# Patient Record
Sex: Male | Born: 1950 | Race: White | Hispanic: No | Marital: Married | Smoking: Never smoker
Health system: Southern US, Community
[De-identification: ages and names within clinical notes are randomized; demographics above are authoritative.]

## PROBLEM LIST (undated history)

## (undated) DIAGNOSIS — I10 Essential (primary) hypertension: Secondary | ICD-10-CM

## (undated) DIAGNOSIS — R51 Headache: Secondary | ICD-10-CM

## (undated) DIAGNOSIS — Z87442 Personal history of urinary calculi: Secondary | ICD-10-CM

## (undated) DIAGNOSIS — N200 Calculus of kidney: Secondary | ICD-10-CM

## (undated) DIAGNOSIS — I639 Cerebral infarction, unspecified: Secondary | ICD-10-CM

## (undated) DIAGNOSIS — I209 Angina pectoris, unspecified: Secondary | ICD-10-CM

## (undated) DIAGNOSIS — R519 Headache, unspecified: Secondary | ICD-10-CM

## (undated) DIAGNOSIS — E785 Hyperlipidemia, unspecified: Secondary | ICD-10-CM

## (undated) DIAGNOSIS — I2510 Atherosclerotic heart disease of native coronary artery without angina pectoris: Secondary | ICD-10-CM

## (undated) DIAGNOSIS — M1990 Unspecified osteoarthritis, unspecified site: Secondary | ICD-10-CM

## (undated) HISTORY — PX: BACK SURGERY: SHX140

## (undated) HISTORY — PX: KIDNEY STONE SURGERY: SHX686

## (undated) HISTORY — PX: VASECTOMY: SHX75

## (undated) HISTORY — PX: LITHOTRIPSY: SUR834

---

## 1988-10-31 HISTORY — PX: RETINAL DETACHMENT SURGERY: SHX105

## 1988-10-31 HISTORY — PX: CATARACT EXTRACTION W/ INTRAOCULAR LENS IMPLANT: SHX1309

## 1988-10-31 HISTORY — PX: OTHER SURGICAL HISTORY: SHX169

## 1997-08-29 ENCOUNTER — Encounter: Admission: RE | Admit: 1997-08-29 | Discharge: 1997-11-27 | Payer: Self-pay | Admitting: Internal Medicine

## 1998-10-22 ENCOUNTER — Encounter: Payer: Self-pay | Admitting: Urology

## 1998-10-24 ENCOUNTER — Ambulatory Visit (HOSPITAL_COMMUNITY): Admission: RE | Admit: 1998-10-24 | Discharge: 1998-10-24 | Payer: Self-pay | Admitting: Urology

## 1998-10-24 ENCOUNTER — Encounter: Payer: Self-pay | Admitting: Urology

## 1998-11-21 ENCOUNTER — Ambulatory Visit (HOSPITAL_COMMUNITY): Admission: RE | Admit: 1998-11-21 | Discharge: 1998-11-21 | Payer: Self-pay | Admitting: Urology

## 1998-11-21 ENCOUNTER — Encounter: Payer: Self-pay | Admitting: Urology

## 1999-05-30 ENCOUNTER — Encounter: Admission: RE | Admit: 1999-05-30 | Discharge: 1999-05-30 | Payer: Self-pay | Admitting: Urology

## 1999-05-30 ENCOUNTER — Encounter: Payer: Self-pay | Admitting: Urology

## 1999-06-02 ENCOUNTER — Other Ambulatory Visit: Admission: RE | Admit: 1999-06-02 | Discharge: 1999-06-02 | Payer: Self-pay

## 1999-06-02 ENCOUNTER — Encounter (INDEPENDENT_AMBULATORY_CARE_PROVIDER_SITE_OTHER): Payer: Self-pay

## 1999-09-29 ENCOUNTER — Encounter: Admission: RE | Admit: 1999-09-29 | Discharge: 1999-09-29 | Payer: Self-pay | Admitting: Urology

## 1999-09-29 ENCOUNTER — Encounter: Payer: Self-pay | Admitting: Urology

## 1999-11-05 ENCOUNTER — Encounter: Admission: RE | Admit: 1999-11-05 | Discharge: 1999-11-05 | Payer: Self-pay | Admitting: Urology

## 1999-11-05 ENCOUNTER — Encounter: Payer: Self-pay | Admitting: Urology

## 2000-03-12 ENCOUNTER — Encounter: Admission: RE | Admit: 2000-03-12 | Discharge: 2000-03-12 | Payer: Self-pay | Admitting: Urology

## 2000-03-12 ENCOUNTER — Encounter: Payer: Self-pay | Admitting: Urology

## 2001-10-19 ENCOUNTER — Encounter: Payer: Self-pay | Admitting: Urology

## 2001-10-19 ENCOUNTER — Encounter: Admission: RE | Admit: 2001-10-19 | Discharge: 2001-10-19 | Payer: Self-pay | Admitting: Urology

## 2001-12-21 ENCOUNTER — Encounter: Admission: RE | Admit: 2001-12-21 | Discharge: 2001-12-21 | Payer: Self-pay | Admitting: Urology

## 2001-12-21 ENCOUNTER — Encounter: Payer: Self-pay | Admitting: Urology

## 2001-12-22 ENCOUNTER — Ambulatory Visit (HOSPITAL_BASED_OUTPATIENT_CLINIC_OR_DEPARTMENT_OTHER): Admission: RE | Admit: 2001-12-22 | Discharge: 2001-12-22 | Payer: Self-pay | Admitting: Urology

## 2001-12-22 ENCOUNTER — Encounter: Payer: Self-pay | Admitting: Urology

## 2002-01-09 ENCOUNTER — Ambulatory Visit (HOSPITAL_BASED_OUTPATIENT_CLINIC_OR_DEPARTMENT_OTHER): Admission: RE | Admit: 2002-01-09 | Discharge: 2002-01-09 | Payer: Self-pay | Admitting: Urology

## 2002-01-09 ENCOUNTER — Encounter: Payer: Self-pay | Admitting: Urology

## 2004-04-12 ENCOUNTER — Ambulatory Visit (HOSPITAL_COMMUNITY): Admission: RE | Admit: 2004-04-12 | Discharge: 2004-04-13 | Payer: Self-pay | Admitting: Neurosurgery

## 2005-10-05 ENCOUNTER — Ambulatory Visit (HOSPITAL_COMMUNITY): Admission: RE | Admit: 2005-10-05 | Discharge: 2005-10-05 | Payer: Self-pay | Admitting: Urology

## 2007-01-17 ENCOUNTER — Encounter: Admission: RE | Admit: 2007-01-17 | Discharge: 2007-01-17 | Payer: Self-pay | Admitting: Family Medicine

## 2010-07-18 NOTE — Op Note (Signed)
Mitchell Lambert, Mitchell Lambert NO.:  [id number]   MEDICAL RECORD NO.:  [id number]          PATIENT TYPE:  INP   LOCATION:                               FACILITY:  [facility]   PHYSICIAN:  Hewitt Shorts, M.D.DATE OF BIRTH:  12-03-1950   DATE OF PROCEDURE:  04/12/2004  DATE OF DISCHARGE:  04/13/2004                                 OPERATIVE REPORT   PREOPERATIVE DIAGNOSES:  1.  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  2.  Lumbar degenerative disk disease.  3.  Lumbar spondylosis.  4.  Lumbar radiculopathy.   POSTOPERATIVE DIAGNOSES:  1.  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  2.  Lumbar degenerative disk disease.  3.  Lumbar spondylosis.  4.  Lumbar radiculopathy.   PROCEDURE:  Left L4-5 lumbar laminotomy and microdiskectomy.   SURGEON:  Hewitt Shorts, M.D.   ANESTHESIA:  General endotracheal.   INDICATIONS:  The patient is a 60 year old man, who presented with an acute  left L5 lumbar radiculopathy with a left foot drop.  The decision was made  to proceed with lumbar laminotomy and microdiskectomy.   PROCEDURE:  The patient was brought to the operating room and placed under  general endotracheal anesthesia.  The patient was turned to a prone  position.  The lumbar region was prepped with Betadine soap and solution and  draped in a sterile fashion.  The midline was infiltrated with local  anesthetic with epinephrine.  An x-ray was taken and the L4-5 level  identified, then a midline incision was made over the L4-5 level and carried  down through the subcutaneous tissue.  Bipolar cautery and electrocautery  were used to maintain hemostasis.  The incision was carried down to the  lumbar fascia, which was incised on the left side of the midline in the  paraspinal muscles with dissection of the spinous processes and lamina in a  subperiosteal fashion.  The L4-5 intralaminar space was identified and an x-  ray was taken to confirm the localization.  Then, the microscope  was draped  and brought onto the field to provide additional magnification,  illumination, and visualization, and the remainder of the decompression was  performed using microdissection in a microsurgical technique.  A laminotomy  was performed using the X-Max drill and Kerrison punches. The ligamentum  flavum was carefully resected, and we identified the thecal sac and exiting  left L5 nerve root.  These structures were retracted medially, and we  identified the free fragment of disk herniation that had migrated caudally  behind the body of L5 and extending out into the left L5 nerve root foramen.  The remaining ligament fibers over the free fragment were opened and the  disk fragment extruded.  We removed two large fragments of disk.  No further  fragments were found in the epidural space or neuroforamen, and it was felt  that a good decompression of the thecal sac and exiting left L5 nerve root  was achieved.  We then examined the annulus at the L4-5 level, which was  degenerated and bulging, but it was not felt that any  sort of decompression  was necessary, and therefore, hemostasis was established with the use of  bipolar cautery as well as Gelfoam soaked in thrombin and the Gelfoam was  removed prior to closure.  Once hemostasis was established, we instilled 2  mL of fentanyl into the epidural space and proceeded with closure.  The deep  fascia was closed with interrupted undyed #1 Vicryl sutures, the  subcutaneous and subcuticular layers closed with interrupted undyed 2-0  Vicryl sutures, and the skin approximated with Dermabond.  The patient  tolerated the procedure well.  The estimated blood loss was 25 mL.  Sponge  and needle count were correct.  Following the surgery, the patient was  turned back to the supine position to be reversed from anesthetic,  extubated, and transferred to the recovery room for further care.      RWN/MEDQ  D:  04/12/2004  T:  04/13/2004  Job:   [id number]

## 2010-07-18 NOTE — Op Note (Signed)
NAME:  Mitchell Lambert, Mitchell Lambert                         ACCOUNT NO.:  [id number]   MEDICAL RECORD NO.:  [id number]                   PATIENT TYPE:  AMB   LOCATION:  [facility]                                 FACILITY:  [facility]   PHYSICIAN:  Bertram Millard. Dahlstedt, M.D.          DATE OF BIRTH:  [birth year]   DATE OF PROCEDURE:  01/09/2002  DATE OF DISCHARGE:                                 OPERATIVE REPORT   PREOPERATIVE DIAGNOSIS:  Mid left ureteral calculus.   POSTOPERATIVE DIAGNOSIS:  Mid left ureteral calculus.   PRINCIPAL PROCEDURE:  1. Cystoscopy.  2. Left retrograde ureteropyelogram.  3. Holmium laser lithotripsy of mid ureteral stone.  4. Double-J stent placement.  5. Interpretation of fluoroscopy.   SURGEON:  Bertram Millard. Dahlstedt, M.D.   ANESTHESIA:  General with LMA.   COMPLICATIONS:  None.   BRIEF HISTORY:  This 60 year old man has had persistent left ureteral stone  for many weeks.  He has been relatively asymptomatic, but the stone has not  been adequately treated with extracorporeal shockwave lithotripsy.  The  patient's body habitus precludes another try at that.  Due to nonpassage of  the stone and intermittent pain, it was recommended that the patient undergo  direct ureteroscopy with laser lithotripsy of the stone.  He is aware of  risks and complications as well as alternatives.  He desires to proceed.   DESCRIPTION OF PROCEDURE:  The patient was administered a general anesthetic  after IV antibiotics were given preoperatively.  He was taken to the  operating room where general anesthetic was introduced with the patient  being placed in the dorsal lithotomy position.  Genitalia and perineum were  prepped and draped.  A cystoscope was placed in to the bladder, and a left  retrograde was performed showing a mid ureteral stone without obstruction at  the present time.  A guidewire was advanced by the stone, and the scope was  removed.  A ureteroscope was advanced directly to  the stone.  The stone was  quite impacted behind a mild stricture.  Once the stone was encountered, the  large laser fiber was placed through, and the stone was fragmented into  multiple small pieces.  It was quite evident that the stone was impacted on  the medial surface of the ureter, and I felt that there may be a small  perforation of this area.  Fragmentation was completed, and the small  fragments were removed with a nitinol basket.  I did not see any other  fragments.  Following extraction of the fragments, the ureteroscope was  removed, and a 26 cm x 6 French double-J stent with a string on was passed  up into the left ureter and to the renal pelvis using fluoroscopic and  cystoscopic guidance.  Once the guidewire was removed, good proximal and  distal curls were seen on the stent.  The bladder was drained and the scope  removed.  At this point, the patient was awakened and taken to the PACU in stable  condition.  He was given 30 mg of Toradol IV prior to waking up and also a  B&O suppository.   He will follow up in my office in approximately 1-2 weeks with a KUB.  He  was told to remove the stent by pulling the string in three days.  Discharge  medications include Keflex 500 mg p.o. q.12h. x 5 days, Oxybutynin 5 mg p.o.  q.8h. p.r.n. urinary frequency, and Percocet 10/325, 1 p.o. q.4h. p.r.n.  pain.                                               Bertram Millard. Dahlstedt, M.D.    SMD/MEDQ  D:  01/09/2002  T:  01/09/2002  Job:  [id number]

## 2011-04-27 ENCOUNTER — Emergency Department (HOSPITAL_COMMUNITY): Payer: BC Managed Care – PPO

## 2011-04-27 ENCOUNTER — Inpatient Hospital Stay (HOSPITAL_COMMUNITY)
Admission: EM | Admit: 2011-04-27 | Discharge: 2011-05-05 | DRG: 107 | Disposition: A | Discharge status: Home / Self Care | Payer: BC Managed Care – PPO | Attending: Thoracic Surgery (Cardiothoracic Vascular Surgery) | Admitting: Thoracic Surgery (Cardiothoracic Vascular Surgery)

## 2011-04-27 ENCOUNTER — Other Ambulatory Visit: Payer: Self-pay

## 2011-04-27 ENCOUNTER — Encounter (HOSPITAL_COMMUNITY): Payer: Self-pay | Admitting: *Deleted

## 2011-04-27 DIAGNOSIS — M109 Gout, unspecified: Secondary | ICD-10-CM | POA: Diagnosis present

## 2011-04-27 DIAGNOSIS — I2510 Atherosclerotic heart disease of native coronary artery without angina pectoris: Principal | ICD-10-CM | POA: Diagnosis present

## 2011-04-27 DIAGNOSIS — Z882 Allergy status to sulfonamides status: Secondary | ICD-10-CM

## 2011-04-27 DIAGNOSIS — E669 Obesity, unspecified: Secondary | ICD-10-CM

## 2011-04-27 DIAGNOSIS — Z79899 Other long term (current) drug therapy: Secondary | ICD-10-CM

## 2011-04-27 DIAGNOSIS — E8779 Other fluid overload: Secondary | ICD-10-CM | POA: Diagnosis not present

## 2011-04-27 DIAGNOSIS — Z7982 Long term (current) use of aspirin: Secondary | ICD-10-CM

## 2011-04-27 DIAGNOSIS — I10 Essential (primary) hypertension: Secondary | ICD-10-CM

## 2011-04-27 DIAGNOSIS — Z794 Long term (current) use of insulin: Secondary | ICD-10-CM

## 2011-04-27 DIAGNOSIS — Z6830 Body mass index (BMI) 30.0-30.9, adult: Secondary | ICD-10-CM

## 2011-04-27 DIAGNOSIS — E785 Hyperlipidemia, unspecified: Secondary | ICD-10-CM

## 2011-04-27 DIAGNOSIS — Z888 Allergy status to other drugs, medicaments and biological substances status: Secondary | ICD-10-CM

## 2011-04-27 DIAGNOSIS — E875 Hyperkalemia: Secondary | ICD-10-CM | POA: Diagnosis not present

## 2011-04-27 DIAGNOSIS — N200 Calculus of kidney: Secondary | ICD-10-CM | POA: Insufficient documentation

## 2011-04-27 DIAGNOSIS — E119 Type 2 diabetes mellitus without complications: Secondary | ICD-10-CM

## 2011-04-27 DIAGNOSIS — Z881 Allergy status to other antibiotic agents status: Secondary | ICD-10-CM

## 2011-04-27 DIAGNOSIS — I200 Unstable angina: Secondary | ICD-10-CM | POA: Diagnosis present

## 2011-04-27 HISTORY — PX: CARDIAC CATHETERIZATION: SHX172

## 2011-04-27 HISTORY — DX: Headache, unspecified: R51.9

## 2011-04-27 HISTORY — DX: Hyperlipidemia, unspecified: E78.5

## 2011-04-27 HISTORY — DX: Headache: R51

## 2011-04-27 HISTORY — DX: Atherosclerotic heart disease of native coronary artery without angina pectoris: I25.10

## 2011-04-27 HISTORY — DX: Angina pectoris, unspecified: I20.9

## 2011-04-27 HISTORY — DX: Unspecified osteoarthritis, unspecified site: M19.90

## 2011-04-27 HISTORY — DX: Essential (primary) hypertension: I10

## 2011-04-27 LAB — COMPREHENSIVE METABOLIC PANEL
ALT: 125 U/L — ABNORMAL HIGH (ref 0–53)
AST: 86 U/L — ABNORMAL HIGH (ref 0–37)
Albumin: 3.6 g/dL (ref 3.5–5.2)
Alkaline Phosphatase: 129 U/L — ABNORMAL HIGH (ref 39–117)
BUN: 25 mg/dL — ABNORMAL HIGH (ref 6–23)
CO2: 24 mEq/L (ref 19–32)
Calcium: 10.1 mg/dL (ref 8.4–10.5)
Chloride: 105 mEq/L (ref 96–112)
Creatinine, Ser: 1.22 mg/dL (ref 0.50–1.35)
GFR calc Af Amer: 72 mL/min — ABNORMAL LOW (ref >90)
GFR calc non Af Amer: 62 mL/min — ABNORMAL LOW (ref >90)
Glucose, Bld: 101 mg/dL — ABNORMAL HIGH (ref 70–99)
Potassium: 3.9 mEq/L (ref 3.5–5.1)
Sodium: 140 mEq/L (ref 135–145)
Total Bilirubin: 0.5 mg/dL (ref 0.3–1.2)
Total Protein: 7.3 g/dL (ref 6.0–8.3)

## 2011-04-27 LAB — DIFFERENTIAL
Basophils Absolute: 0 10*3/uL (ref 0.0–0.1)
Basophils Relative: 1 % (ref 0–1)
Eosinophils Absolute: 0.1 10*3/uL (ref 0.0–0.7)
Eosinophils Relative: 2 % (ref 0–5)
Lymphocytes Relative: 39 % (ref 12–46)
Lymphs Abs: 2.4 10*3/uL (ref 0.7–4.0)
Monocytes Absolute: 0.6 10*3/uL (ref 0.1–1.0)
Monocytes Relative: 10 % (ref 3–12)
Neutro Abs: 3 10*3/uL (ref 1.7–7.7)
Neutrophils Relative %: 49 % (ref 43–77)

## 2011-04-27 LAB — BASIC METABOLIC PANEL
BUN: 28 mg/dL — ABNORMAL HIGH (ref 6–23)
CO2: 24 mEq/L (ref 19–32)
Calcium: 10 mg/dL (ref 8.4–10.5)
Chloride: 106 mEq/L (ref 96–112)
Creatinine, Ser: 1.22 mg/dL (ref 0.50–1.35)
GFR calc Af Amer: 72 mL/min — ABNORMAL LOW (ref >90)
GFR calc non Af Amer: 62 mL/min — ABNORMAL LOW (ref >90)
Glucose, Bld: 274 mg/dL — ABNORMAL HIGH (ref 70–99)
Potassium: 4.4 mEq/L (ref 3.5–5.1)
Sodium: 138 mEq/L (ref 135–145)

## 2011-04-27 LAB — CBC
HCT: 41.8 % (ref 39.0–52.0)
HCT: 43.5 % (ref 39.0–52.0)
Hemoglobin: 15 g/dL (ref 13.0–17.0)
Hemoglobin: 15.8 g/dL (ref 13.0–17.0)
MCH: 31.8 pg (ref 26.0–34.0)
MCH: 32.4 pg (ref 26.0–34.0)
MCHC: 35.9 g/dL (ref 30.0–36.0)
MCHC: 36.3 g/dL — ABNORMAL HIGH (ref 30.0–36.0)
MCV: 88.7 fL (ref 78.0–100.0)
MCV: 89.3 fL (ref 78.0–100.0)
Platelets: 180 10*3/uL (ref 150–400)
Platelets: 183 10*3/uL (ref 150–400)
RBC: 4.71 MIL/uL (ref 4.22–5.81)
RBC: 4.87 MIL/uL (ref 4.22–5.81)
RDW: 13 % (ref 11.5–15.5)
RDW: 13.2 % (ref 11.5–15.5)
WBC: 5.3 10*3/uL (ref 4.0–10.5)
WBC: 6.1 10*3/uL (ref 4.0–10.5)

## 2011-04-27 LAB — CARDIAC PANEL(CRET KIN+CKTOT+MB+TROPI)
CK, MB: 2.4 ng/mL (ref 0.3–4.0)
CK, MB: 2.2 ng/mL (ref 0.3–4.0)
Relative Index: INVALID (ref 0.0–2.5)
Relative Index: INVALID (ref 0.0–2.5)
Total CK: 46 U/L (ref 7–232)
Total CK: 47 U/L (ref 7–232)
Troponin I: <0.3 ng/mL (ref <0.30)
Troponin I: <0.3 ng/mL (ref <0.30)

## 2011-04-27 LAB — TROPONIN I: Troponin I: <0.3 ng/mL (ref <0.30)

## 2011-04-27 LAB — PROTIME-INR
INR: 1 (ref 0.00–1.49)
Prothrombin Time: 13.4 seconds (ref 11.6–15.2)

## 2011-04-27 LAB — GLUCOSE, CAPILLARY: Glucose-Capillary: 107 mg/dL — ABNORMAL HIGH (ref 70–99)

## 2011-04-27 LAB — APTT: aPTT: 27 seconds (ref 24–37)

## 2011-04-27 MED ORDER — OXYCODONE-ACETAMINOPHEN 5-325 MG PO TABS
1.0000 | ORAL_TABLET | Freq: Every day | ORAL | Status: DC | PRN
  Administered 2011-04-28 (×2): 1 via ORAL
  Filled 2011-04-27 (×4): qty 1

## 2011-04-27 MED ORDER — ACETAMINOPHEN 325 MG PO TABS: 650.0000 mg | ORAL_TABLET | ORAL | Status: DC | PRN

## 2011-04-27 MED ORDER — HEPARIN BOLUS VIA INFUSION: 4000.0000 [IU] | Freq: Once | INTRAVENOUS | Status: DC

## 2011-04-27 MED ORDER — BIOTIN 5000 MCG PO TABS: 1.0000 | ORAL_TABLET | Freq: Every day | ORAL | Status: DC

## 2011-04-27 MED ORDER — LOSARTAN POTASSIUM 50 MG PO TABS: 100.0000 mg | ORAL_TABLET | Freq: Every day | ORAL | Status: DC

## 2011-04-27 MED ORDER — METOPROLOL TARTRATE 25 MG PO TABS
25.0000 mg | ORAL_TABLET | Freq: Two times a day (BID) | ORAL | Status: DC
  Administered 2011-04-28 – 2011-04-29 (×4): 25 mg via ORAL
  Filled 2011-04-27 (×7): qty 1

## 2011-04-27 MED ORDER — ONDANSETRON HCL 4 MG/2ML IJ SOLN: 4.0000 mg | Freq: Four times a day (QID) | INTRAMUSCULAR | Status: DC | PRN

## 2011-04-27 MED ORDER — SODIUM CHLORIDE 0.9 % IV SOLN: INTRAVENOUS | Status: DC

## 2011-04-27 MED ORDER — ADULT MULTIVITAMIN W/MINERALS CH: 1.0000 | ORAL_TABLET | Freq: Every day | ORAL | Status: DC

## 2011-04-27 MED ORDER — ALLOPURINOL 300 MG PO TABS
300.0000 mg | ORAL_TABLET | Freq: Every day | ORAL | Status: DC
  Administered 2011-04-28 – 2011-04-29 (×2): 300 mg via ORAL
  Filled 2011-04-27 (×3): qty 1

## 2011-04-27 MED ORDER — OMEGA-3 FATTY ACIDS 1000 MG PO CAPS: 1.0000 g | ORAL_CAPSULE | Freq: Two times a day (BID) | ORAL | Status: DC

## 2011-04-27 MED ORDER — VITAMIN C 500 MG PO TABS
1000.0000 mg | ORAL_TABLET | Freq: Two times a day (BID) | ORAL | Status: DC
  Administered 2011-04-28 – 2011-04-29 (×4): 1000 mg via ORAL
  Filled 2011-04-27 (×6): qty 2

## 2011-04-27 MED ORDER — ASPIRIN 325 MG PO TABS: 325.0000 mg | ORAL_TABLET | ORAL | Status: DC

## 2011-04-27 MED ORDER — SIMVASTATIN 40 MG PO TABS
40.0000 mg | ORAL_TABLET | Freq: Every day | ORAL | Status: DC
  Administered 2011-04-28 – 2011-04-29 (×2): 40 mg via ORAL
  Filled 2011-04-27 (×3): qty 1

## 2011-04-27 MED ORDER — DIAZEPAM 5 MG PO TABS
10.0000 mg | ORAL_TABLET | ORAL | Status: AC
  Administered 2011-04-28: 10 mg via ORAL
  Filled 2011-04-27 (×2): qty 1

## 2011-04-27 MED ORDER — OXYCODONE-ACETAMINOPHEN 10-325 MG PO TABS: 0.5000 | ORAL_TABLET | Freq: Every day | ORAL | Status: DC | PRN

## 2011-04-27 MED ORDER — VITAMIN D3 125 MCG (5000 UT) PO TABS: 1.0000 | ORAL_TABLET | Freq: Every day | ORAL | Status: DC

## 2011-04-27 MED ORDER — INSULIN ASPART 100 UNIT/ML ~~LOC~~ SOLN
0.0000 [IU] | Freq: Three times a day (TID) | SUBCUTANEOUS | Status: DC
  Filled 2011-04-27: qty 3

## 2011-04-27 MED ORDER — INSULIN GLARGINE 100 UNIT/ML ~~LOC~~ SOLN
140.0000 [IU] | Freq: Every morning | SUBCUTANEOUS | Status: DC
  Administered 2011-04-28 – 2011-04-29 (×2): 140 [IU] via SUBCUTANEOUS
  Filled 2011-04-27 (×2): qty 3

## 2011-04-27 MED ORDER — ASPIRIN EC 81 MG PO TBEC
81.0000 mg | DELAYED_RELEASE_TABLET | Freq: Every day | ORAL | Status: DC
  Administered 2011-04-28 – 2011-04-29 (×2): 81 mg via ORAL
  Filled 2011-04-27 (×3): qty 1

## 2011-04-27 MED ORDER — HEPARIN SOD (PORCINE) IN D5W 100 UNIT/ML IV SOLN
1400.0000 [IU]/h | INTRAVENOUS | Status: DC
  Filled 2011-04-27 (×5): qty 250

## 2011-04-27 MED ORDER — INSULIN ASPART 100 UNIT/ML ~~LOC~~ SOLN
20.0000 [IU] | Freq: Three times a day (TID) | SUBCUTANEOUS | Status: DC
  Administered 2011-04-28: 20 [IU] via SUBCUTANEOUS
  Filled 2011-04-27: qty 3

## 2011-04-27 MED ORDER — NITROGLYCERIN 0.4 MG SL SUBL
0.4000 mg | SUBLINGUAL_TABLET | SUBLINGUAL | Status: DC | PRN
  Administered 2011-04-27: 0.4 mg via SUBLINGUAL
  Filled 2011-04-27 (×2): qty 25

## 2011-04-27 NOTE — ED Notes (Signed)
Glucose 107

## 2011-04-27 NOTE — ED Notes (Signed)
Called pharmacy re: Heparin & med request, per pharmacy there is a system problem, pharmacy to send Heparin ASAP

## 2011-04-27 NOTE — ED Notes (Signed)
2013-01 rEADY

## 2011-04-27 NOTE — ED Notes (Signed)
Rn on 2000 unable to accept pt due to CP 2/10.  Dr. Donnie Aho will be paged

## 2011-04-27 NOTE — ED Notes (Signed)
RN on 2000 will call me back

## 2011-04-27 NOTE — ED Notes (Signed)
Spoke with cardiology regarding pt situation and he informed me to change to stepdown.  Flow manager notified of need for change in bed status

## 2011-04-27 NOTE — ED Notes (Signed)
Attempt to call report to 2000 again

## 2011-04-27 NOTE — ED Notes (Signed)
Attempt to call report to 2000 

## 2011-04-27 NOTE — ED Notes (Signed)
Paged Dr. Tresa Endo to [phone]

## 2011-04-27 NOTE — ED Notes (Signed)
Attempted to phone report to floor. receiving RN unable to take report at this time. Left number for nurse to call.

## 2011-04-27 NOTE — Progress Notes (Signed)
ANTICOAGULATION CONSULT NOTE - Initial Consult  Pharmacy Consult for Heparin Indication: chest pain/ACS  Allergies  Allergen Reactions  . Doxycycline   . Gabapentin   . Metformin And Related   . Sulfa Antibiotics     Patient Measurements: Height: 6' (182.9 cm) Weight: 255 lb (115.667 kg) IBW/kg (Calculated) : 77.6  Heparin Dosing Weight: 101  Vital Signs: Temp: 98.6 F (37 C) (02/25 2144) Temp src: Oral (02/25 1507) BP: 149/69 mmHg (02/25 2144) Pulse Rate: 69  (02/25 2144)  Labs:  Basename 04/27/11 2006 04/27/11 1533  HGB -- 15.0  HCT -- 41.8  PLT -- 180  APTT -- --  LABPROT -- --  INR -- --  HEPARINUNFRC -- --  CREATININE -- 1.22  CKTOTAL 46 --  CKMB 2.4 --  TROPONINI <0.30 <0.30   Estimated Creatinine Clearance: 83.5 ml/min (by C-G formula based on Cr of 1.22).  Medical History: Past Medical History  Diagnosis Date  . Diabetes mellitus   . Hypertension   . Hyperlipemia   . Kidney stone   . Arthritis     Medications:   (Not in a hospital admission)  Assessment: 61 yo M admitted with CP.  Pharmacy consulted to start heparin.  Patient denies recent surgery or bleeding disorder.  Noted wart on palm of right hand that bleeds intermittently, not bleeding now.  Goal of Therapy:  Heparin level 0.3-0.7 units/ml   Plan:  1. Heparin 4000 units IV x 1 then 1400 units/hr 2. Heparin level 6h after ggt starts 3. Daily heparin level and CBC  Lovenia Kim Pharm.D., BCPS Clinical Pharmacist 04/27/2011 10:19 PM Pager: [phone] Phone: [phone]    Rutledge Selsor Merrily Brittle 04/27/2011,10:07 PM

## 2011-04-27 NOTE — H&P (Addendum)
Admit date: 04/27/2011 Name:  Mitchell Lambert Medical record number: [id number] DOB/Age:  62-26-1952  61 y.o.  Referring Physician:   Dr. Gwyneth Sprout  Primary Physician: Dr. 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 He has had significant arthritis for which she takes ibuprofen. He noted about a month of intermittent substernal chest discomfort when he would walk up hills it would resolve. On Friday evening he had the onset of tightness in his anterior chest that radiated across both shoulders and somewhat into his left arm that occurred at rest. The symptoms persisted somewhat on Saturday and were not necessarily related to activity. He did not have significant symptoms on Sunday. Today after going to work had the onset of midsternal tightness in particular with radiation down the left arm that persisted. He called his family physician who advised him to come to the emergency room this afternoon and he was seen in the emergency room her initial troponin was negative and EKG was fairly unremarkable. Because of the suggestive history he is admitted to the hospital for treatment of unstable angina. He is having some vague symptoms at the present time.   Past Medical History  Diagnosis Date  . Diabetes mellitus   . Hypertension   . Hyperlipemia   . Kidney stone   . Arthritis       Past Surgical History  Procedure Date  . Lithotripsy   . Kidney stone surgery   . Surgery on toe   .  Allergies: is allergic to doxycycline; gabapentin; metformin and related; and sulfa antibiotics.    Medications:  Prior to Admission medications   Medication Sig Start Date End Date Taking? Authorizing Provider  allopurinol (ZYLOPRIM) 300 MG tablet Take 300 mg by mouth daily.   Yes Historical Provider, MD  Biotin 5000 MCG TABS Take  1 tablet by mouth daily.   Yes Historical Provider, MD  Cholecalciferol (VITAMIN D3) 5000 UNITS TABS Take 1 tablet by mouth daily.   Yes Historical Provider, MD  fish oil-omega-3 fatty acids 1000 MG capsule Take 1 g by mouth 2 (two) times daily.   Yes Historical Provider, MD  ibuprofen (ADVIL,MOTRIN) 200 MG tablet Take 400 mg by mouth every 6 (six) hours as needed. For pain   Yes Historical Provider, MD  insulin aspart (NOVOLOG) 100 UNIT/ML injection Inject 20-30 Units into the skin 3 (three) times daily before meals. Pt is on a sliding scale   Yes Historical Provider, MD  insulin glargine (LANTUS) 100 UNIT/ML injection Inject 140 Units into the skin every morning.   Yes Historical Provider, MD  losartan (COZAAR) 100 MG tablet Take 100 mg by mouth daily.   Yes Historical Provider, MD  Multiple Vitamin (MULITIVITAMIN WITH MINERALS) TABS Take 1 tablet by mouth daily.   Yes Historical Provider, MD  oxyCODONE-acetaminophen (PERCOCET) 10-325 MG per tablet Take 0.5 tablets by mouth daily as needed. For pain   Yes Historical Provider, MD  simvastatin (ZOCOR) 40 MG tablet Take 40 mg by mouth every morning.   Yes Historical Provider, MD  vitamin C (ASCORBIC ACID) 500 MG tablet Take 1,000 mg by mouth 2 (two) times daily.   Yes Historical Provider, MD  . Family History:    Mother died of stroke and had a history of cardiovascular disease. His father is living and in his 3s and has a history of  a stroke and also myocardial infarction. He has 2 brothers and one sister who are alive and well. A grandfather had diabetes.  Social History:   reports that he has never smoked. He has never used smokeless tobacco. He reports that he drinks alcohol. He reports that he does not use illicit drugs.   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     Review of Systems:  General ROS: Obese for many years, has not been able to get much exercise. Energy level  has been good, no fever. Ophthalmic ROS: Has a previous retinal detachment but no diabetic retinopathy, has had a previous lens implant ENT ROS: negative Hematological and Lymphatic ROS: negative for - bleeding problems, bruising or weight loss Respiratory ROS: no cough, shortness of breath, or wheezing Cardiovascular ROS: See history of present illness Gastrointestinal ROS: no abdominal pain, change in bowel habits, or black or bloody stools Genito-Urinary ROS: Mild nocturia, has a history of uric acid kidney stones and multiple lithotripsies. Passes stones fairly often. Mild erectile dysfunction. Musculoskeletal ROS: Significant arthritis involving the right foot. He has seen a pain specialist for significant pain and arthritis in the past. Neurological ROS: no TIA or stroke symptoms, he does have a history of some neuropathic symptoms involving his lower legs. Other than as noted above, the remainder of the review of systems is normal  Physical Exam: BP 167/94  Pulse 77  Temp(Src) 98.9 F (37.2 C) (Oral)  Resp 18  Ht 6' (1.829 m)  Wt 115.667 kg (255 lb)  BMI 34.58 kg/m2  SpO2 96% General appearance: alert, appears stated age, no distress and moderately obese Head: Normocephalic, without obvious abnormality, atraumatic Eyes: Pupils equal bilaterally, EOMI, funduscopic not done Ears: Normal external canals Throat: lips, mucosa, and tongue normal; teeth and gums normal Neck: no adenopathy, no carotid bruit, no JVD, supple, symmetrical, trachea midline and thyroid not enlarged, symmetric, no tenderness/mass/nodules Lungs: clear to auscultation bilaterally Chest wall: no tenderness Heart: regular rate and rhythm, S1, S2 normal, no murmur, click, rub or gallop Abdomen: soft, non-tender; bowel sounds normal; no masses,  no organomegaly Rectal: deferred Extremities: extremities normal, atraumatic, no cyanosis or edema Pulses: 2+ and symmetric Skin: Skin color, texture, turgor normal. No  rashes or lesions Neurologic: Alert and oriented X 3, normal strength and tone. Normal symmetric reflexes. Normal coordination and gait  Labs: Results for orders placed during the hospital encounter of 04/27/11 (from the past 24 hour(s))  CBC     Status: Normal   Collection Time   04/27/11  3:33 PM      Component Value Range   WBC 5.3  4.0 - 10.5 (K/uL)   RBC 4.71  4.22 - 5.81 (MIL/uL)   Hemoglobin 15.0  13.0 - 17.0 (g/dL)   HCT 16.1  09.6 - 04.5 (%)   MCV 88.7  78.0 - 100.0 (fL)   MCH 31.8  26.0 - 34.0 (pg)   MCHC 35.9  30.0 - 36.0 (g/dL)   RDW 40.9  81.1 - 91.4 (%)   Platelets 180  150 - 400 (K/uL)  BASIC METABOLIC PANEL     Status: Abnormal   Collection Time   04/27/11  3:33 PM      Component Value Range   Sodium 138  135 - 145 (mEq/L)   Potassium 4.4  3.5 - 5.1 (mEq/L)   Chloride 106  96 - 112 (mEq/L)   CO2 24  19 - 32 (mEq/L)   Glucose, Bld 274 (*) 70 - 99 (mg/dL)   BUN 28 (*) 6 - 23 (mg/dL)   Creatinine, Ser 1.61  0.50 - 1.35 (mg/dL)   Calcium 09.6  8.4 - 10.5 (mg/dL)   GFR calc non Af Amer 62 (*) >90 (mL/min)   GFR calc Af Amer 72 (*) >90 (mL/min)  TROPONIN I     Status: Normal   Collection Time   04/27/11  3:33 PM      Component Value Range   Troponin I <0.30  <0.30 (ng/mL)  CARDIAC PANEL(CRET KIN+CKTOT+MB+TROPI)     Status: Normal   Collection Time   04/27/11  8:06 PM      Component Value Range   Total CK 46  7 - 232 (U/L)   CK, MB 2.4  0.3 - 4.0 (ng/mL)   Troponin I <0.30  <0.30 (ng/mL)   Relative Index RELATIVE INDEX IS INVALID  0.0 - 2.5    EKG: Q-wave and T-wave inversion in lead 3, otherwise unremarkable  Radiology: Normal heart size, no active cardiopulmonary disease   IMPRESSIONS: 1. Chest pain consistent with unstable angina pectoris 2. Insulin-dependent diabetes mellitus 3. Hypertension 4. Hyperlipidemia under treatment 5. History of uric acid kidney stones 6. Obesity  PLAN: Telemetry, serial cardiac enzymes, intravenous heparin, beta  blocker, will need cardiac catheterization because of suggestive history multiple risk factors since he is at high risk of ischemia.Cardiac catheterization was discussed with the patient fully including risks of myocardial infarction, death, stroke, bleeding, arrhythmia, dye allergy, renal insufficiency or bleeding.  The patient understands and is willing to proceed. Possibility of intervention also discussed with patient including percutaneous intervention or possibility of bypass surgery.   Signed: Darden Palmer. MD [facility] 04/27/2011, 9:13 PM     Patient seen and examined.  No interval change in history and exam since last night..  Stable for procedure.  Darden Palmer. MD [facility]  04/28/2011   7:30 am

## 2011-04-27 NOTE — ED Notes (Signed)
Patient reports he had pain in his chest when he went to bed on Friday night.  He also reports aching into both arms. Patient states he took aspirin.  Patient states on Saturday the sx persisted.  On Sunday he rested.  Patient states today he had return of sx while at work,  He called his md and was advised to come to Ed

## 2011-04-27 NOTE — ED Provider Notes (Addendum)
History     CSN: [id number]  Arrival date & time 04/27/11  1456   First MD Initiated Contact with Patient 04/27/11 1857      Chief Complaint  Patient presents with  . Chest Pain    (Consider location/radiation/quality/duration/timing/severity/associated sxs/prior treatment) Patient is a 61 y.o. male presenting with chest pain. The history is provided by the patient.  Chest Pain The chest pain began 2 days ago. Duration of episode(s) is 15 minutes. Chest pain occurs intermittently. The chest pain is improving. The pain is associated with stress and exertion (Seems to be worse with exertion and stress). At its most intense, the pain is at 6/10. The pain is currently at 1/10. The severity of the pain is mild. The quality of the pain is described as heavy and pressure-like. The pain radiates to the left arm and right arm. Exacerbated by: Worst episode occurred with rest after lying down. Pertinent negatives for primary symptoms include no fever, no syncope, no shortness of breath, no cough, no wheezing, no palpitations, no abdominal pain, no nausea, no vomiting and no dizziness.  Pertinent negatives for associated symptoms include no diaphoresis and no lower extremity edema. He tried aspirin for the symptoms. Risk factors include male gender.  His past medical history is significant for diabetes and hypertension.  Procedure history is negative for cardiac catheterization and stress echo.     Past Medical History  Diagnosis Date  . Diabetes mellitus   . Hypertension   . Hyperlipemia   . Kidney stone   . Arthritis     Past Surgical History  Procedure Date  . Lithotripsy   . Kidney stone surgery     No family history on file.  History  Substance Use Topics  . Smoking status: Never Smoker   . Smokeless tobacco: Not on file  . Alcohol Use: Yes      Review of Systems  Constitutional: Negative for fever and diaphoresis.  Respiratory: Negative for cough, shortness of breath  and wheezing.   Cardiovascular: Positive for chest pain. Negative for palpitations and syncope.  Gastrointestinal: Negative for nausea, vomiting and abdominal pain.  Neurological: Negative for dizziness.  All other systems reviewed and are negative.    Allergies  Doxycycline; Gabapentin; Metformin and related; and Sulfa antibiotics  Home Medications   Current Outpatient Rx  Name Route Sig Dispense Refill  . ALLOPURINOL 300 MG PO TABS Oral Take 300 mg by mouth daily.    Marland Kitchen BIOTIN 5000 MCG PO TABS Oral Take 1 tablet by mouth daily.    Marland Kitchen VITAMIN D3 5000 UNITS PO TABS Oral Take 1 tablet by mouth daily.    . OMEGA-3 FATTY ACIDS 1000 MG PO CAPS Oral Take 1 g by mouth 2 (two) times daily.    . IBUPROFEN 200 MG PO TABS Oral Take 400 mg by mouth every 6 (six) hours as needed. For pain    . INSULIN ASPART 100 UNIT/ML [place] SOLN Subcutaneous Inject 20-30 Units into the skin 3 (three) times daily before meals. Pt is on a sliding scale    . INSULIN GLARGINE 100 UNIT/ML [place] SOLN Subcutaneous Inject 140 Units into the skin every morning.    Marland Kitchen LOSARTAN POTASSIUM 100 MG PO TABS Oral Take 100 mg by mouth daily.    . ADULT MULTIVITAMIN W/MINERALS CH Oral Take 1 tablet by mouth daily.    . OXYCODONE-ACETAMINOPHEN 10-325 MG PO TABS Oral Take 0.5 tablets by mouth daily as needed. For pain    .  SIMVASTATIN 40 MG PO TABS Oral Take 40 mg by mouth every morning.    Marland Kitchen VITAMIN C 500 MG PO TABS Oral Take 1,000 mg by mouth 2 (two) times daily.      BP 167/94  Pulse 77  Temp(Src) 98.9 F (37.2 C) (Oral)  Resp 18  Ht 6' (1.829 m)  Wt 255 lb (115.667 kg)  BMI 34.58 kg/m2  SpO2 96%  Physical Exam  Nursing note and vitals reviewed. Constitutional: He is oriented to person, place, and time. He appears well-developed and well-nourished. No distress.  HENT:  Head: Normocephalic and atraumatic.  Mouth/Throat: Oropharynx is clear and moist.  Eyes: Conjunctivae and EOM are normal. Pupils are equal, round, and  reactive to light.  Neck: Normal range of motion. Neck supple.  Cardiovascular: Normal rate, regular rhythm and intact distal pulses.   No murmur heard. Pulmonary/Chest: Effort normal and breath sounds normal. No respiratory distress. He has no wheezes. He has no rales. He exhibits no tenderness.  Abdominal: Soft. He exhibits no distension. There is no tenderness. There is no rebound and no guarding.  Musculoskeletal: Normal range of motion. He exhibits no edema and no tenderness.  Neurological: He is alert and oriented to person, place, and time.  Skin: Skin is warm and dry. No rash noted. No erythema.  Psychiatric: He has a normal mood and affect. His behavior is normal.    ED Course  Procedures (including critical care time)  Labs Reviewed  BASIC METABOLIC PANEL - Abnormal; Notable for the following:    Glucose, Bld 274 (*)    BUN 28 (*)    GFR calc non Af Amer 62 (*)    GFR calc Af Amer 72 (*)    All other components within normal limits  CBC  TROPONIN I  CARDIAC PANEL(CRET KIN+CKTOT+MB+TROPI)   Dg Chest 2 View  04/27/2011  *RADIOLOGY REPORT*  Clinical Data: Chest pain and tightness for 4 days  CHEST - 2 VIEW  Comparison: 01/31/2009  Findings: Heart size is normal.  There is no pleural effusion or edema.  No airspace consolidation identified.  Review of the visualized osseous structures is unremarkable.  IMPRESSION:  1.  No active cardiopulmonary abnormalities.  Original Report Authenticated By: Rosealee Albee, M.D.     1. Unstable angina       MDM   Patient with a history of chest pain over the last 3 days which is concerning for unstable angina. The pain is intermittent and seems to be worse with work or exertion. He states over the last one month with walking up the stairs intermittently he will get some chest pain that will go away. 2 days ago the pain radiated down both arms and he has had no associated symptoms with it. Is not related to elevated blood sugar or  bleeding. His EKG is unchanged today. Chest x-ray, CBC, BMP, cardiac enzymes were within normal limits. However given symptoms concern for underlying etiology. Will discuss with cardiology but needs to be admitted for rule out.   Spoke with Dr. Tresa Endo who will admit the pt for further work up and cath.     Gwyneth Sprout, MD 04/27/11 1933  Gwyneth Sprout, MD 04/27/11 4742

## 2011-04-27 NOTE — ED Notes (Signed)
Dr. Donnie Aho is at the bedside

## 2011-04-27 NOTE — Progress Notes (Signed)
Called to ED twice to receive report. No answer.

## 2011-04-28 ENCOUNTER — Encounter (HOSPITAL_COMMUNITY): Payer: Self-pay | Admitting: General Practice

## 2011-04-28 ENCOUNTER — Encounter (HOSPITAL_COMMUNITY)
Admission: EM | Disposition: A | Discharge status: Home / Self Care | Payer: Self-pay | Source: Home / Self Care | Attending: Thoracic Surgery (Cardiothoracic Vascular Surgery)

## 2011-04-28 ENCOUNTER — Other Ambulatory Visit: Payer: Self-pay

## 2011-04-28 ENCOUNTER — Inpatient Hospital Stay (HOSPITAL_COMMUNITY): Payer: BC Managed Care – PPO

## 2011-04-28 DIAGNOSIS — I2510 Atherosclerotic heart disease of native coronary artery without angina pectoris: Secondary | ICD-10-CM

## 2011-04-28 HISTORY — PX: LEFT HEART CATHETERIZATION WITH CORONARY ANGIOGRAM: SHX5451

## 2011-04-28 LAB — LIPID PANEL
Cholesterol: 173 mg/dL (ref 0–200)
HDL: 42 mg/dL (ref >39)
LDL Cholesterol: 104 mg/dL — ABNORMAL HIGH (ref 0–99)
Total CHOL/HDL Ratio: 4.1 RATIO
Triglycerides: 137 mg/dL (ref <150)
VLDL: 27 mg/dL (ref 0–40)

## 2011-04-28 LAB — CARDIAC PANEL(CRET KIN+CKTOT+MB+TROPI)
CK, MB: 2.2 ng/mL (ref 0.3–4.0)
CK, MB: 2.3 ng/mL (ref 0.3–4.0)
Relative Index: INVALID (ref 0.0–2.5)
Relative Index: INVALID (ref 0.0–2.5)
Total CK: 47 U/L (ref 7–232)
Total CK: 43 U/L (ref 7–232)
Troponin I: <0.3 ng/mL (ref <0.30)
Troponin I: <0.3 ng/mL (ref <0.30)

## 2011-04-28 LAB — CBC
HCT: 41 % (ref 39.0–52.0)
Hemoglobin: 14.8 g/dL (ref 13.0–17.0)
MCH: 32.2 pg (ref 26.0–34.0)
MCHC: 36.1 g/dL — ABNORMAL HIGH (ref 30.0–36.0)
MCV: 89.1 fL (ref 78.0–100.0)
Platelets: 159 10*3/uL (ref 150–400)
RBC: 4.6 MIL/uL (ref 4.22–5.81)
RDW: 13.2 % (ref 11.5–15.5)
WBC: 6.7 10*3/uL (ref 4.0–10.5)

## 2011-04-28 LAB — GLUCOSE, CAPILLARY
Glucose-Capillary: 104 mg/dL — ABNORMAL HIGH (ref 70–99)
Glucose-Capillary: 167 mg/dL — ABNORMAL HIGH (ref 70–99)
Glucose-Capillary: 177 mg/dL — ABNORMAL HIGH (ref 70–99)
Glucose-Capillary: 197 mg/dL — ABNORMAL HIGH (ref 70–99)
Glucose-Capillary: 87 mg/dL (ref 70–99)

## 2011-04-28 LAB — TYPE AND SCREEN
ABO/RH(D): B POS
Antibody Screen: NEGATIVE

## 2011-04-28 LAB — HEPARIN LEVEL (UNFRACTIONATED)
Heparin Unfractionated: 0.35 IU/mL (ref 0.30–0.70)
Heparin Unfractionated: 0.13 IU/mL — ABNORMAL LOW (ref 0.30–0.70)

## 2011-04-28 LAB — HEMOGLOBIN A1C
Hgb A1c MFr Bld: 9.3 % — ABNORMAL HIGH (ref <5.7)
Mean Plasma Glucose: 220 mg/dL — ABNORMAL HIGH (ref <117)

## 2011-04-28 LAB — TSH: TSH: 2.569 u[IU]/mL (ref 0.350–4.500)

## 2011-04-28 LAB — POCT ACTIVATED CLOTTING TIME: Activated Clotting Time: 100 seconds

## 2011-04-28 SURGERY — LEFT HEART CATHETERIZATION WITH CORONARY ANGIOGRAM
Anesthesia: LOCAL

## 2011-04-28 MED ORDER — ALPRAZOLAM 0.25 MG PO TABS
0.2500 mg | ORAL_TABLET | ORAL | Status: DC | PRN
Start: 2011-04-28 — End: 2011-04-30
  Administered 2011-04-29 – 2011-04-30 (×2): 0.5 mg via ORAL
  Filled 2011-04-28 (×2): qty 2
  Filled 2011-04-28: qty 1

## 2011-04-28 MED ORDER — HEPARIN (PORCINE) IN NACL 2-0.9 UNIT/ML-% IJ SOLN
INTRAMUSCULAR | Status: AC
  Filled 2011-04-28: qty 2000

## 2011-04-28 MED ORDER — OMEGA-3-ACID ETHYL ESTERS 1 G PO CAPS
1.0000 g | ORAL_CAPSULE | Freq: Two times a day (BID) | ORAL | Status: DC
  Administered 2011-04-28 – 2011-04-29 (×4): 1 g via ORAL
  Filled 2011-04-28 (×6): qty 1

## 2011-04-28 MED ORDER — LOSARTAN POTASSIUM 50 MG PO TABS
100.0000 mg | ORAL_TABLET | Freq: Every day | ORAL | Status: DC
  Administered 2011-04-28 – 2011-04-29 (×2): 100 mg via ORAL
  Filled 2011-04-28 (×3): qty 2

## 2011-04-28 MED ORDER — HEPARIN SOD (PORCINE) IN D5W 100 UNIT/ML IV SOLN
1400.0000 [IU]/h | INTRAVENOUS | Status: DC
  Administered 2011-04-28: 1400 [IU]/h via INTRAVENOUS
  Filled 2011-04-28 (×2): qty 250

## 2011-04-28 MED ORDER — CHLORHEXIDINE GLUCONATE 4 % EX LIQD
60.0000 mL | Freq: Once | CUTANEOUS | Status: AC
  Administered 2011-04-30: 4 via TOPICAL
  Filled 2011-04-28: qty 60

## 2011-04-28 MED ORDER — CHLORHEXIDINE GLUCONATE 4 % EX LIQD: 60.0000 mL | Freq: Once | CUTANEOUS | Status: DC

## 2011-04-28 MED ORDER — SODIUM CHLORIDE 0.9 % IV SOLN
1.0000 mL/kg/h | INTRAVENOUS | Status: DC
  Administered 2011-04-28: 1 mL/kg/h via INTRAVENOUS

## 2011-04-28 MED ORDER — INSULIN NPH (HUMAN) (ISOPHANE) 100 UNIT/ML ~~LOC~~ SUSP: 10.0000 [IU] | SUBCUTANEOUS | Status: DC

## 2011-04-28 MED ORDER — METOPROLOL TARTRATE 12.5 MG HALF TABLET
12.5000 mg | ORAL_TABLET | Freq: Once | ORAL | Status: AC
  Administered 2011-04-30: 12.5 mg via ORAL
  Filled 2011-04-28: qty 1

## 2011-04-28 MED ORDER — VITAMIN D3 25 MCG (1000 UNIT) PO TABS
5000.0000 [IU] | ORAL_TABLET | Freq: Every day | ORAL | Status: DC
  Administered 2011-04-28 – 2011-04-29 (×2): 5000 [IU] via ORAL
  Filled 2011-04-28 (×3): qty 5

## 2011-04-28 MED ORDER — DIAZEPAM 5 MG PO TABS
5.0000 mg | ORAL_TABLET | Freq: Once | ORAL | Status: AC
  Administered 2011-04-30: 5 mg via ORAL
  Filled 2011-04-28: qty 1

## 2011-04-28 MED ORDER — NITROGLYCERIN 0.2 MG/ML ON CALL CATH LAB
INTRAVENOUS | Status: AC
  Filled 2011-04-28: qty 1

## 2011-04-28 MED ORDER — CHLORHEXIDINE GLUCONATE 4 % EX LIQD
60.0000 mL | Freq: Once | CUTANEOUS | Status: AC
  Administered 2011-04-29: 4 via TOPICAL
  Filled 2011-04-28: qty 60

## 2011-04-28 MED ORDER — BISACODYL 5 MG PO TBEC
5.0000 mg | DELAYED_RELEASE_TABLET | Freq: Once | ORAL | Status: AC
  Administered 2011-04-29: 5 mg via ORAL
  Filled 2011-04-28: qty 1

## 2011-04-28 MED ORDER — ACETAMINOPHEN 325 MG PO TABS: 650.0000 mg | ORAL_TABLET | ORAL | Status: DC | PRN

## 2011-04-28 MED ORDER — INSULIN ASPART 100 UNIT/ML ~~LOC~~ SOLN
0.0000 [IU] | Freq: Three times a day (TID) | SUBCUTANEOUS | Status: DC
  Administered 2011-04-28: 7 [IU] via SUBCUTANEOUS
  Administered 2011-04-28: 4 [IU] via SUBCUTANEOUS
  Administered 2011-04-29 (×2): 7 [IU] via SUBCUTANEOUS
  Administered 2011-04-29: 3 [IU] via SUBCUTANEOUS

## 2011-04-28 MED ORDER — LIDOCAINE HCL (PF) 1 % IJ SOLN
INTRAMUSCULAR | Status: AC
  Filled 2011-04-28: qty 30

## 2011-04-28 MED ORDER — HEPARIN SOD (PORCINE) IN D5W 100 UNIT/ML IV SOLN
1650.0000 [IU]/h | INTRAVENOUS | Status: DC
  Administered 2011-04-28: 1400 [IU]/h via INTRAVENOUS
  Administered 2011-04-30: 1650 [IU]/h via INTRAVENOUS
  Filled 2011-04-28 (×5): qty 250

## 2011-04-28 MED ORDER — ADULT MULTIVITAMIN W/MINERALS CH
1.0000 | ORAL_TABLET | Freq: Every day | ORAL | Status: DC
  Administered 2011-04-28 – 2011-04-29 (×2): 1 via ORAL
  Filled 2011-04-28 (×3): qty 1

## 2011-04-28 MED ORDER — HEPARIN BOLUS VIA INFUSION
4000.0000 [IU] | Freq: Once | INTRAVENOUS | Status: AC
  Administered 2011-04-28: 4000 [IU] via INTRAVENOUS
  Filled 2011-04-28: qty 4000

## 2011-04-28 MED ORDER — TEMAZEPAM 15 MG PO CAPS
15.0000 mg | ORAL_CAPSULE | Freq: Once | ORAL | Status: AC | PRN
  Administered 2011-04-29: 15 mg via ORAL
  Filled 2011-04-28: qty 1

## 2011-04-28 NOTE — CV Procedure (Addendum)
Cardiac Catheterization Report   Mitchell Lambert  1950/10/19  [id number]  04/28/11   PROCEDURE:  Left heart catheterization with selective coronary angiography, left ventriculogram.  INDICATIONS:  Unstable angina  The risks, benefits, and details of the procedure were explained to the patient.  The patient verbalized understanding and wanted to proceed.  Informed written consent was obtained.  PROCEDURE TECHNIQUE:  After Xylocaine anesthesia a 1F sheath was placed in the right femoral artery with a single anterior needle wall stick.   Left coronary angiography was done using a Judkins L4 guide catheter.  Right coronary angiography was done using a Judkins R4 guide catheter.  Left ventriculography was done using a pigtail catheter. The digital images were reviewed with interventional cardiologist and the patient was taken off of the table for sheath removal.   CONTRAST:  Total of 80 cc.  COMPLICATIONS:  None.    HEMODYNAMICS:  Aortic postcontrast: 153/85 LV post contrast 153/15-18. There was no gradient between the left ventricle and aorta.    ANGIOGRAPHIC DATA:    CORONARY ARTERIES:   Arise and distribute normally.  Right dominant. Moderate coronary calcification is noted.  Left main coronary artery: Somewhat short.  Left anterior descending: Calcified in the midportion. There is a moderate ostial stenosis noted. There is moderate 60% stenosis after the diagonal branch noted with some mild diffuse disease..  Circumflex coronary artery: 70% mid vessel stenosis.  Right coronary artery: Dominant vessel severe 99% segmental stenosis in the midportion somewhat diffuse disease following this, mild distal disease noted.Marland Kitchen  LEFT VENTRICULOGRAM:  Performed in the 30 RAO projection.  The aortic and mitral valves are normal. There appears to very minimal inferobasal hypokinesis noted. Estimated ejection fraction is 60%.   IMPRESSIONS:  1. 3 vessel  coronary artery disease with severe disease noted involving the mid right coronary artery and moderately severe disease involving LAD and circumflex 2. Normal left ventricular function with very minimal inferobasal hypokinesis  RECOMMENDATION:  Films were reviewed with interventional cardiologist Dr. Eldridge Dace. With the presence of diabetes mellitus and three-vessel coronary artery disease will obtain cardiovascular surgical consultation.Darden Palmer MD [facility]

## 2011-04-28 NOTE — Consult Note (Signed)
Reason for Consult:3 vessel CAD Referring Physician: Dr. Antoine Primas SHADRICK SENNE is an 61 y.o. male.  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 He has noted several episodes of chest tightness and arm pain over the past month. These were exertion related and resolved with rest. Friday he had severe episode with severe chest pressure radiating to both arms and severe pain in the left arm. He had some intermittent pain Saturday but none on Sunday. Monday AM had another significant episode on the way to work and came to ED Monday afternoon.  He r/o for MI, but at catheterization today had severe 3 vessel CAD. Currently pain free.  Past Medical History  Diagnosis Date  . Diabetes mellitus   . Hypertension   . Hyperlipemia   . Arthritis   . Gout   . Angina   . Coronary artery disease   . Kidney stone     "close to 100 since age 14"  . Chronic daily headache     "@ least every other day"    Past Surgical History  Procedure Date  . Lithotripsy     "many; probably 5 times"  . Kidney stone surgery     "probably 3 times"  . Cardiac catheterization 04/27/11  . Cataract extraction w/ intraocular lens implant 1990's    right; "lens was replaced twice over 3 months; lens is actually over iris"  . Retinal detachment surgery 1990's    right eye; "made me have an early cataract"  . Bone spur 1990's    right great toe; "probably related to gout"    History reviewed. No pertinent family history.  Social History:  reports that he has never smoked. He has never used smokeless tobacco. He reports that he drinks alcohol. He reports that he uses illicit drugs (Marijuana).  Allergies:  Allergies  Allergen Reactions  . Gabapentin Other (See Comments)    "made me so dizzy I missed work for 2 days"  . Metformin And Related Shortness Of Breath  . Sulfa Antibiotics Shortness Of Breath and Rash  . Doxycycline Rash    Medications:  Prior to Admission:    Prescriptions prior to admission  Medication Sig Dispense Refill  . allopurinol (ZYLOPRIM) 300 MG tablet Take 300 mg by mouth daily.      . Biotin 5000 MCG TABS Take 1 tablet by mouth daily.      . Cholecalciferol (VITAMIN D3) 5000 UNITS TABS Take 1 tablet by mouth daily.      . fish oil-omega-3 fatty acids 1000 MG capsule Take 1 g by mouth 2 (two) times daily.      Marland Kitchen ibuprofen (ADVIL,MOTRIN) 200 MG tablet Take 400 mg by mouth every 6 (six) hours as needed. For pain      . insulin aspart (NOVOLOG) 100 UNIT/ML injection Inject 20-30 Units into the skin 3 (three) times daily before meals. Pt is on a sliding scale      . insulin glargine (LANTUS) 100 UNIT/ML injection Inject 140 Units into the skin every morning.      Marland Kitchen losartan (COZAAR) 100 MG tablet Take 100 mg by mouth daily.      . Multiple Vitamin (MULITIVITAMIN WITH MINERALS) TABS Take 1 tablet by mouth daily.      Marland Kitchen oxyCODONE-acetaminophen (PERCOCET) 10-325 MG per tablet Take 0.5 tablets by mouth daily as needed. For pain      . simvastatin (ZOCOR) 40 MG tablet  Take 40 mg by mouth every morning.      . vitamin C (ASCORBIC ACID) 500 MG tablet Take 1,000 mg by mouth 2 (two) times daily.        Results for orders placed during the hospital encounter of 04/27/11 (from the past 48 hour(s))  CBC     Status: Normal   Collection Time   04/27/11  3:33 PM      Component Value Range Comment   WBC 5.3  4.0 - 10.5 (K/uL)    RBC 4.71  4.22 - 5.81 (MIL/uL)    Hemoglobin 15.0  13.0 - 17.0 (g/dL)    HCT 16.1  09.6 - 04.5 (%)    MCV 88.7  78.0 - 100.0 (fL)    MCH 31.8  26.0 - 34.0 (pg)    MCHC 35.9  30.0 - 36.0 (g/dL)    RDW 40.9  81.1 - 91.4 (%)    Platelets 180  150 - 400 (K/uL)   BASIC METABOLIC PANEL     Status: Abnormal   Collection Time   04/27/11  3:33 PM      Component Value Range Comment   Sodium 138  135 - 145 (mEq/L)    Potassium 4.4  3.5 - 5.1 (mEq/L)    Chloride 106  96 - 112 (mEq/L)    CO2 24  19 - 32 (mEq/L)    Glucose, Bld 274  (*) 70 - 99 (mg/dL)    BUN 28 (*) 6 - 23 (mg/dL)    Creatinine, Ser 7.82  0.50 - 1.35 (mg/dL)    Calcium 95.6  8.4 - 10.5 (mg/dL)    GFR calc non Af Amer 62 (*) >90 (mL/min)    GFR calc Af Amer 72 (*) >90 (mL/min)   TROPONIN I     Status: Normal   Collection Time   04/27/11  3:33 PM      Component Value Range Comment   Troponin I <0.30  <0.30 (ng/mL)   CARDIAC PANEL(CRET KIN+CKTOT+MB+TROPI)     Status: Normal   Collection Time   04/27/11  8:06 PM      Component Value Range Comment   Total CK 46  7 - 232 (U/L)    CK, MB 2.4  0.3 - 4.0 (ng/mL)    Troponin I <0.30  <0.30 (ng/mL)    Relative Index RELATIVE INDEX IS INVALID  0.0 - 2.5    CARDIAC PANEL(CRET KIN+CKTOT+MB+TROPI)     Status: Normal   Collection Time   04/27/11  9:41 PM      Component Value Range Comment   Total CK 47  7 - 232 (U/L)    CK, MB 2.2  0.3 - 4.0 (ng/mL)    Troponin I <0.30  <0.30 (ng/mL)    Relative Index RELATIVE INDEX IS INVALID  0.0 - 2.5    PROTIME-INR     Status: Normal   Collection Time   04/27/11  9:42 PM      Component Value Range Comment   Prothrombin Time 13.4  11.6 - 15.2 (seconds)    INR 1.00  0.00 - 1.49    APTT     Status: Normal   Collection Time   04/27/11  9:42 PM      Component Value Range Comment   aPTT 27  24 - 37 (seconds)   CBC     Status: Abnormal   Collection Time   04/27/11  9:42 PM      Component Value Range Comment  WBC 6.1  4.0 - 10.5 (K/uL)    RBC 4.87  4.22 - 5.81 (MIL/uL)    Hemoglobin 15.8  13.0 - 17.0 (g/dL)    HCT 16.1  09.6 - 04.5 (%)    MCV 89.3  78.0 - 100.0 (fL)    MCH 32.4  26.0 - 34.0 (pg)    MCHC 36.3 (*) 30.0 - 36.0 (g/dL)    RDW 40.9  81.1 - 91.4 (%)    Platelets 183  150 - 400 (K/uL)   DIFFERENTIAL     Status: Normal   Collection Time   04/27/11  9:42 PM      Component Value Range Comment   Neutrophils Relative 49  43 - 77 (%)    Neutro Abs 3.0  1.7 - 7.7 (K/uL)    Lymphocytes Relative 39  12 - 46 (%)    Lymphs Abs 2.4  0.7 - 4.0 (K/uL)    Monocytes  Relative 10  3 - 12 (%)    Monocytes Absolute 0.6  0.1 - 1.0 (K/uL)    Eosinophils Relative 2  0 - 5 (%)    Eosinophils Absolute 0.1  0.0 - 0.7 (K/uL)    Basophils Relative 1  0 - 1 (%)    Basophils Absolute 0.0  0.0 - 0.1 (K/uL)   TSH     Status: Normal   Collection Time   04/27/11  9:42 PM      Component Value Range Comment   TSH 2.569  0.350 - 4.500 (uIU/mL)   COMPREHENSIVE METABOLIC PANEL     Status: Abnormal   Collection Time   04/27/11  9:42 PM      Component Value Range Comment   Sodium 140  135 - 145 (mEq/L)    Potassium 3.9  3.5 - 5.1 (mEq/L)    Chloride 105  96 - 112 (mEq/L)    CO2 24  19 - 32 (mEq/L)    Glucose, Bld 101 (*) 70 - 99 (mg/dL)    BUN 25 (*) 6 - 23 (mg/dL)    Creatinine, Ser 7.82  0.50 - 1.35 (mg/dL)    Calcium 95.6  8.4 - 10.5 (mg/dL)    Total Protein 7.3  6.0 - 8.3 (g/dL)    Albumin 3.6  3.5 - 5.2 (g/dL)    AST 86 (*) 0 - 37 (U/L)    ALT 125 (*) 0 - 53 (U/L)    Alkaline Phosphatase 129 (*) 39 - 117 (U/L)    Total Bilirubin 0.5  0.3 - 1.2 (mg/dL)    GFR calc non Af Amer 62 (*) >90 (mL/min)    GFR calc Af Amer 72 (*) >90 (mL/min)   HEMOGLOBIN A1C     Status: Abnormal   Collection Time   04/27/11  9:42 PM      Component Value Range Comment   Hemoglobin A1C 9.3 (*) <5.7 (%)    Mean Plasma Glucose 220 (*) <117 (mg/dL)   GLUCOSE, CAPILLARY     Status: Abnormal   Collection Time   04/27/11  9:59 PM      Component Value Range Comment   Glucose-Capillary 107 (*) 70 - 99 (mg/dL)    Comment 1 Documented in Chart     GLUCOSE, CAPILLARY     Status: Abnormal   Collection Time   04/28/11 12:19 AM      Component Value Range Comment   Glucose-Capillary 197 (*) 70 - 99 (mg/dL)   LIPID PANEL     Status:  Abnormal   Collection Time   04/28/11  4:03 AM      Component Value Range Comment   Cholesterol 173  0 - 200 (mg/dL)    Triglycerides 161  <150 (mg/dL)    HDL 42  >09 (mg/dL)    Total CHOL/HDL Ratio 4.1      VLDL 27  0 - 40 (mg/dL)    LDL Cholesterol 604 (*) 0  - 99 (mg/dL)   HEPARIN LEVEL (UNFRACTIONATED)     Status: Normal   Collection Time   04/28/11  4:03 AM      Component Value Range Comment   Heparin Unfractionated 0.35  0.30 - 0.70 (IU/mL)   CBC     Status: Abnormal   Collection Time   04/28/11  4:03 AM      Component Value Range Comment   WBC 6.7  4.0 - 10.5 (K/uL)    RBC 4.60  4.22 - 5.81 (MIL/uL)    Hemoglobin 14.8  13.0 - 17.0 (g/dL)    HCT 54.0  98.1 - 19.1 (%)    MCV 89.1  78.0 - 100.0 (fL)    MCH 32.2  26.0 - 34.0 (pg)    MCHC 36.1 (*) 30.0 - 36.0 (g/dL)    RDW 47.8  29.5 - 62.1 (%)    Platelets 159  150 - 400 (K/uL)   CARDIAC PANEL(CRET KIN+CKTOT+MB+TROPI)     Status: Normal   Collection Time   04/28/11  4:05 AM      Component Value Range Comment   Total CK 47  7 - 232 (U/L)    CK, MB 2.2  0.3 - 4.0 (ng/mL)    Troponin I <0.30  <0.30 (ng/mL)    Relative Index RELATIVE INDEX IS INVALID  0.0 - 2.5    GLUCOSE, CAPILLARY     Status: Normal   Collection Time   04/28/11  4:57 AM      Component Value Range Comment   Glucose-Capillary 87  70 - 99 (mg/dL)    Comment 1 Documented in Chart      Comment 2 Notify RN     GLUCOSE, CAPILLARY     Status: Abnormal   Collection Time   04/28/11  7:09 AM      Component Value Range Comment   Glucose-Capillary 167 (*) 70 - 99 (mg/dL)   GLUCOSE, CAPILLARY     Status: Abnormal   Collection Time   04/28/11  8:21 AM      Component Value Range Comment   Glucose-Capillary 104 (*) 70 - 99 (mg/dL)   CARDIAC PANEL(CRET KIN+CKTOT+MB+TROPI)     Status: Normal   Collection Time   04/28/11  9:49 AM      Component Value Range Comment   Total CK 43  7 - 232 (U/L)    CK, MB 2.3  0.3 - 4.0 (ng/mL)    Troponin I <0.30  <0.30 (ng/mL)    Relative Index RELATIVE INDEX IS INVALID  0.0 - 2.5    GLUCOSE, CAPILLARY     Status: Abnormal   Collection Time   04/28/11 12:34 PM      Component Value Range Comment   Glucose-Capillary 177 (*) 70 - 99 (mg/dL)     Dg Chest 2 View  05/07/6576  *RADIOLOGY REPORT*   Clinical Data: Chest pain and tightness for 4 days  CHEST - 2 VIEW  Comparison: 01/31/2009  Findings: Heart size is normal.  There is no pleural effusion or edema.  No airspace  consolidation identified.  Review of the visualized osseous structures is unremarkable.  IMPRESSION:  1.  No active cardiopulmonary abnormalities.  Original Report Authenticated By: Rosealee Albee, M.D.    Review of Systems  Constitutional: Positive for malaise/fatigue (for one year).  HENT: Negative.   Eyes:       History retinal detachment, lens implant  Respiratory: Negative for shortness of breath.   Cardiovascular: Positive for chest pain.  Gastrointestinal: Negative.   Genitourinary:       Nocturia, mild erectile dysfunction  Musculoskeletal: Positive for joint pain (feet R > L).  Neurological: Negative.   Endo/Heme/Allergies: Does not bruise/bleed easily.  Psychiatric/Behavioral: Negative.   All other systems reviewed and are negative.   Blood pressure 130/78, pulse 63, temperature 98.4 F (36.9 C), temperature source Oral, resp. rate 17, height 6' (1.829 m), weight 251 lb 8.7 oz (114.1 kg), SpO2 95.00%. Physical Exam  Vitals reviewed. Constitutional: He is oriented to person, place, and time. He appears well-developed and well-nourished. No distress.  HENT:  Head: Normocephalic and atraumatic.  Eyes: EOM are normal. Pupils are equal, round, and reactive to light.  Neck: Neck supple. No JVD present. No tracheal deviation present. No thyromegaly present.  Cardiovascular: Normal rate, regular rhythm, normal heart sounds and intact distal pulses.  Exam reveals no gallop and no friction rub.   No murmur heard.      Normal Allen's test on left  Respiratory: Effort normal and breath sounds normal. He has no wheezes. He has no rales.  GI: Soft. There is no tenderness.  Musculoskeletal: He exhibits no edema.  Lymphadenopathy:    He has no cervical adenopathy.  Neurological: He is alert and oriented to  person, place, and time.       No focal motor deficits  Skin: Skin is warm and dry.    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 CABG is the best option for treatment and is indicated for survival benefit and relief of symptoms. He is not a candidate for BIMA due to IDDM but has a normal Allen's test in his nondominant left hand and is a candidate to use a radial artery.   I have discussed with the patient the general nature of the procedure, need for general anesthesia, and incisions to be used. I discussed the indications, risks, benefits and alternative treatments. I have discussed the expected hospital stay, overall recovery and short and long term outcomes. They understand the risks include but are not limited to death, stroke, MI, DVT/PE, bleeding, possible need for transfusion, infections, other organ system dysfunction including respiratory, renal, or GI complications. He has an excellent chance of procedural success. He understands and accepts these risks and agrees to proceed.  For OR Thursday 2/28 AM  Daiel Strohecker C 04/28/2011, 5:46 PM

## 2011-04-28 NOTE — Progress Notes (Signed)
  ANTICOAGULATION CONSULT NOTE - Follow Up Consult  Pharmacy Consult for Heparin Indication: 3 vessel CAD s/p cath awaiting TCTS consult  Allergies  Allergen Reactions  . Doxycycline   . Gabapentin   . Metformin And Related   . Sulfa Antibiotics     Patient Measurements: Height: 6' (182.9 cm) Weight: 251 lb 8.7 oz (114.1 kg) IBW/kg (Calculated) : 77.6  Heparin Dosing Weight: 102.1kg  Vital Signs: Temp: 97.7 F (36.5 C) (02/26 0448) Temp src: Oral (02/26 0448) BP: 126/86 mmHg (02/26 1008) Pulse Rate: 68  (02/26 1008)  Labs:  Basename 04/28/11 0405 04/28/11 0403 04/27/11 2142 04/27/11 2141 04/27/11 2006 04/27/11 1533  HGB -- 14.8 15.8 -- -- --  HCT -- 41.0 43.5 -- -- 41.8  PLT -- 159 183 -- -- 180  APTT -- -- 27 -- -- --  LABPROT -- -- 13.4 -- -- --  INR -- -- 1.00 -- -- --  HEPARINUNFRC -- 0.35 -- -- -- --  CREATININE -- -- 1.22 -- -- 1.22  CKTOTAL 47 -- -- 47 46 --  CKMB 2.2 -- -- 2.2 2.4 --  TROPONINI <0.30 -- -- <0.30 <0.30 --   Estimated Creatinine Clearance: 82.9 ml/min (by C-G formula based on Cr of 1.22).   Medications:  Heparin 1400 units/hr (on hold for cath)  Assessment: 60yom admitted with CP now s/p cath awaiting TCTS for 3 vessel CAD. Per MD, restart heparin 8hrs after sheath pulled. Per cath report, sheath pulled ~ 0845. Heparin was therapeutic (HL 0.35) this AM on 1400 units/hr - will restart at same rate.  - H/H and Plts wnl - No significant bleeding reported  Goal of Therapy:  Heparin level 0.3-0.7 units/ml   Plan:  1. Restart heparin 1400 units/hr (14 ml/hr) @ 1645 (8hrs post sheath pull per MD order) 2. Check heparin level 6hrs after heparin drip restart 3. Continue daily heparin level and CBC 4. Follow-up TCTS recommendations and plan  Cleon Dew [phone] 04/28/2011,10:30 AM

## 2011-04-28 NOTE — Progress Notes (Signed)
ANTICOAGULATION CONSULT NOTE - Initial Consult  Pharmacy Consult for Heparin Indication: chest pain/ACS  Allergies  Allergen Reactions  . Doxycycline   . Gabapentin   . Metformin And Related   . Sulfa Antibiotics     Patient Measurements: Height: 6' (182.9 cm) Weight: 251 lb 8.7 oz (114.1 kg) IBW/kg (Calculated) : 77.6  Heparin Dosing Weight: 101  Vital Signs: Temp: 97.7 F (36.5 C) (02/26 0448) Temp src: Oral (02/26 0448) BP: 128/81 mmHg (02/26 0448) Pulse Rate: 63  (02/26 0448)  Labs:  Mitchell Lambert 04/28/11 0405 04/28/11 0403 04/27/11 2142 04/27/11 2141 04/27/11 2006 04/27/11 1533  HGB -- 14.8 15.8 -- -- --  HCT -- 41.0 43.5 -- -- 41.8  PLT -- 159 183 -- -- 180  APTT -- -- 27 -- -- --  LABPROT -- -- 13.4 -- -- --  INR -- -- 1.00 -- -- --  HEPARINUNFRC -- 0.35 -- -- -- --  CREATININE -- -- 1.22 -- -- 1.22  CKTOTAL 47 -- -- 47 46 --  CKMB 2.2 -- -- 2.2 2.4 --  TROPONINI <0.30 -- -- <0.30 <0.30 --   Estimated Creatinine Clearance: 82.9 ml/min (by C-G formula based on Cr of 1.22).  Assessment: 61 yo male with chest pain for Heparin.   Goal of Therapy:  Heparin level 0.3-0.7 units/ml   Plan:  Continue Heparin at current rate  F/U after cath.  Mitchell Lambert 04/28/2011,4:56 AM

## 2011-04-28 NOTE — Progress Notes (Signed)
UR Completed. Simmons, Armour Villanueva F [phone]  

## 2011-04-29 ENCOUNTER — Inpatient Hospital Stay (HOSPITAL_COMMUNITY): Payer: BC Managed Care – PPO

## 2011-04-29 ENCOUNTER — Other Ambulatory Visit: Payer: Self-pay

## 2011-04-29 ENCOUNTER — Other Ambulatory Visit (HOSPITAL_COMMUNITY): Payer: Self-pay | Admitting: Radiology

## 2011-04-29 DIAGNOSIS — Z01810 Encounter for preprocedural cardiovascular examination: Secondary | ICD-10-CM

## 2011-04-29 LAB — URINALYSIS, ROUTINE W REFLEX MICROSCOPIC
Bilirubin Urine: NEGATIVE
Glucose, UA: NEGATIVE mg/dL
Hgb urine dipstick: NEGATIVE
Ketones, ur: NEGATIVE mg/dL
Leukocytes, UA: NEGATIVE
Nitrite: NEGATIVE
Protein, ur: NEGATIVE mg/dL
Specific Gravity, Urine: 1.021 (ref 1.005–1.030)
Urobilinogen, UA: 0.2 mg/dL (ref 0.0–1.0)
pH: 5.5 (ref 5.0–8.0)

## 2011-04-29 LAB — BLOOD GAS, ARTERIAL
Acid-base deficit: 0.2 mmol/L (ref 0.0–2.0)
Bicarbonate: 24 mEq/L (ref 20.0–24.0)
Drawn by: 27407
FIO2: 0.21 %
O2 Saturation: 95 %
Patient temperature: 98.6
TCO2: 25.2 mmol/L (ref 0–100)
pCO2 arterial: 39.1 mmHg (ref 35.0–45.0)
pH, Arterial: 7.404 (ref 7.350–7.450)
pO2, Arterial: 73.1 mmHg — ABNORMAL LOW (ref 80.0–100.0)

## 2011-04-29 LAB — CBC
HCT: 41.7 % (ref 39.0–52.0)
Hemoglobin: 14.6 g/dL (ref 13.0–17.0)
MCH: 31.3 pg (ref 26.0–34.0)
MCHC: 35 g/dL (ref 30.0–36.0)
MCV: 89.5 fL (ref 78.0–100.0)
Platelets: 170 10*3/uL (ref 150–400)
RBC: 4.66 MIL/uL (ref 4.22–5.81)
RDW: 13.2 % (ref 11.5–15.5)
WBC: 6.5 10*3/uL (ref 4.0–10.5)

## 2011-04-29 LAB — GLUCOSE, CAPILLARY
Glucose-Capillary: 93 mg/dL (ref 70–99)
Glucose-Capillary: 174 mg/dL — ABNORMAL HIGH (ref 70–99)
Glucose-Capillary: 143 mg/dL — ABNORMAL HIGH (ref 70–99)
Glucose-Capillary: 226 mg/dL — ABNORMAL HIGH (ref 70–99)
Glucose-Capillary: 204 mg/dL — ABNORMAL HIGH (ref 70–99)
Glucose-Capillary: 156 mg/dL — ABNORMAL HIGH (ref 70–99)

## 2011-04-29 LAB — PULMONARY FUNCTION TEST

## 2011-04-29 LAB — BASIC METABOLIC PANEL
BUN: 20 mg/dL (ref 6–23)
CO2: 24 mEq/L (ref 19–32)
Calcium: 9.6 mg/dL (ref 8.4–10.5)
Chloride: 109 mEq/L (ref 96–112)
Creatinine, Ser: 1.1 mg/dL (ref 0.50–1.35)
GFR calc Af Amer: 82 mL/min — ABNORMAL LOW (ref >90)
GFR calc non Af Amer: 71 mL/min — ABNORMAL LOW (ref >90)
Glucose, Bld: 122 mg/dL — ABNORMAL HIGH (ref 70–99)
Potassium: 3.8 mEq/L (ref 3.5–5.1)
Sodium: 141 mEq/L (ref 135–145)

## 2011-04-29 LAB — HEPARIN LEVEL (UNFRACTIONATED): Heparin Unfractionated: 0.25 IU/mL — ABNORMAL LOW (ref 0.30–0.70)

## 2011-04-29 LAB — ABO/RH: ABO/RH(D): B POS

## 2011-04-29 LAB — HEMOGLOBIN A1C
Hgb A1c MFr Bld: 9 % — ABNORMAL HIGH (ref <5.7)
Mean Plasma Glucose: 212 mg/dL — ABNORMAL HIGH (ref <117)

## 2011-04-29 MED ORDER — SODIUM CHLORIDE 0.9 % IV SOLN
0.1000 ug/kg/h | INTRAVENOUS | Status: DC
  Filled 2011-04-29: qty 4

## 2011-04-29 MED ORDER — SODIUM CHLORIDE 0.9 % IV SOLN
INTRAVENOUS | Status: DC
  Filled 2011-04-29: qty 40

## 2011-04-29 MED ORDER — DEXTROSE 5 % IV SOLN
1.5000 g | INTRAVENOUS | Status: AC
  Administered 2011-04-30: 1.5 g via INTRAVENOUS
  Administered 2011-04-30: .75 g via INTRAVENOUS
  Filled 2011-04-29: qty 1.5

## 2011-04-29 MED ORDER — MAGNESIUM SULFATE 50 % IJ SOLN
40.0000 meq | INTRAMUSCULAR | Status: DC
  Filled 2011-04-29: qty 10

## 2011-04-29 MED ORDER — DOPAMINE-DEXTROSE 3.2-5 MG/ML-% IV SOLN
2.0000 ug/kg/min | INTRAVENOUS | Status: DC
  Filled 2011-04-29: qty 250

## 2011-04-29 MED ORDER — ~~LOC~~ CARDIAC SURGERY, PATIENT & FAMILY EDUCATION
Freq: Once | Status: AC
  Administered 2011-04-29: 12:00:00
  Filled 2011-04-29: qty 1

## 2011-04-29 MED ORDER — POTASSIUM CHLORIDE 2 MEQ/ML IV SOLN
80.0000 meq | INTRAVENOUS | Status: DC
  Filled 2011-04-29: qty 40

## 2011-04-29 MED ORDER — OXYCODONE-ACETAMINOPHEN 5-325 MG PO TABS
1.0000 | ORAL_TABLET | Freq: Three times a day (TID) | ORAL | Status: DC | PRN
  Administered 2011-04-29 – 2011-04-30 (×2): 1 via ORAL
  Filled 2011-04-29: qty 1

## 2011-04-29 MED ORDER — NITROGLYCERIN IN D5W 200-5 MCG/ML-% IV SOLN
2.0000 ug/min | INTRAVENOUS | Status: DC
  Administered 2011-04-29: 5 ug/min via INTRAVENOUS
  Filled 2011-04-29: qty 250

## 2011-04-29 MED ORDER — DEXTROSE 5 % IV SOLN
750.0000 mg | INTRAVENOUS | Status: DC
  Filled 2011-04-29: qty 750

## 2011-04-29 MED ORDER — PHENYLEPHRINE HCL 10 MG/ML IJ SOLN
30.0000 ug/min | INTRAVENOUS | Status: DC
  Administered 2011-04-30: 10 ug/min via INTRAVENOUS
  Filled 2011-04-29: qty 2

## 2011-04-29 MED ORDER — NITROGLYCERIN IN D5W 200-5 MCG/ML-% IV SOLN
2.0000 ug/min | INTRAVENOUS | Status: DC
  Filled 2011-04-29 (×2): qty 250

## 2011-04-29 MED ORDER — SODIUM CHLORIDE 0.9 % IV SOLN
1500.0000 mg | INTRAVENOUS | Status: AC
  Administered 2011-04-30: 1500 mg via INTRAVENOUS
  Filled 2011-04-29 (×2): qty 1500

## 2011-04-29 MED ORDER — VERAPAMIL HCL 2.5 MG/ML IV SOLN
INTRAVENOUS | Status: AC
  Administered 2011-04-30: 09:00:00
  Filled 2011-04-29: qty 2.5

## 2011-04-29 MED ORDER — SODIUM CHLORIDE 0.9 % IV SOLN
INTRAVENOUS | Status: DC
  Filled 2011-04-29: qty 1

## 2011-04-29 MED ORDER — MAGNESIUM HYDROXIDE 400 MG/5ML PO SUSP
15.0000 mL | Freq: Every day | ORAL | Status: DC | PRN
  Filled 2011-04-29: qty 30

## 2011-04-29 MED ORDER — EPINEPHRINE HCL 1 MG/ML IJ SOLN
0.5000 ug/min | INTRAVENOUS | Status: DC
  Filled 2011-04-29: qty 4

## 2011-04-29 NOTE — Progress Notes (Addendum)
VASCULAR LAB PRELIMINARY  PRELIMINARY  PRELIMINARY  PRELIMINARY  Pre-op Cardiac Surgery  Carotid Findings:  Bilateral:  No evidence of hemodynamically significant internal carotid artery stenosis.   Vertebral artery flow is antegrade.      Upper Extremity Right Left  Brachial Pressures 121 T T  Radial Waveforms T T  Ulnar Waveforms T T  Palmar Arch (Allen's Test) Remains normal with ulnar and radial compressions. Remains normal with ulnar and radial compressions.       Lower  Extremity Right Left      Anterior Tibial 153 T 147 T  Posterior Tibial 167 T 158 T  Ankle/Brachial Indices >1.0 >1.0    Findings:  ABI is within normal limits bilaterally.  Mitchell Lambert, RVT   Mitchell Lambert, RVT 04/29/2011, 12:30 PM

## 2011-04-29 NOTE — Progress Notes (Signed)
  ANTICOAGULATION CONSULT NOTE - Follow Up Consult  Pharmacy Consult for Heparin Indication: 3 vessel CAD s/p cath awaiting TCTS consult  Allergies  Allergen Reactions  . Gabapentin Other (See Comments)    "made me so dizzy I missed work for 2 days"  . Metformin And Related Shortness Of Breath  . Sulfa Antibiotics Shortness Of Breath and Rash  . Doxycycline Rash    Patient Measurements: Height: 6' (182.9 cm) Weight: 255 lb 15.3 oz (116.1 kg) IBW/kg (Calculated) : 77.6  Heparin Dosing Weight: 102.1kg  Vital Signs: Temp: 98.2 F (36.8 C) (02/27 0408) Temp src: Oral (02/27 0408) BP: 142/81 mmHg (02/27 0408) Pulse Rate: 66  (02/27 0408)  Labs:  Basename 04/29/11 0400 04/28/11 2123 04/28/11 0949 04/28/11 0405 04/28/11 0403 04/27/11 2142 04/27/11 2141 04/27/11 1533  HGB 14.6 -- -- -- 14.8 -- -- --  HCT 41.7 -- -- -- 41.0 43.5 -- --  PLT 170 -- -- -- 159 183 -- --  APTT -- -- -- -- -- 27 -- --  LABPROT -- -- -- -- -- 13.4 -- --  INR -- -- -- -- -- 1.00 -- --  HEPARINUNFRC 0.25* 0.13* -- -- 0.35 -- -- --  CREATININE -- -- -- -- -- 1.22 -- 1.22  CKTOTAL -- -- 43 47 -- -- 47 --  CKMB -- -- 2.3 2.2 -- -- 2.2 --  TROPONINI -- -- <0.30 <0.30 -- -- <0.30 --   Estimated Creatinine Clearance: 83.6 ml/min (by C-G formula based on Cr of 1.22).  Assessment: 61 yo male with CAD for Heparin.   Goal of Therapy:  Heparin level 0.3-0.7 units/ml   Plan:  Increase Heparin 1650 units/hr  Eddie Candle 04/29/2011,5:09 AM

## 2011-04-29 NOTE — Progress Notes (Signed)
PFT completed. Preliminary results in Progress Notes in Shadow Chart. 

## 2011-04-29 NOTE — Progress Notes (Signed)
1 Day Post-Op Procedure(s) (LRB): LEFT HEART CATHETERIZATION WITH CORONARY ANGIOGRAM (N/A) Subjective: CP earlier today requiring NTG gtt. Now pain free  Objective: Vital signs in last 24 hours: Temp:  [97.8 F (36.6 C)-98.4 F (36.9 C)] 97.9 F (36.6 C) (02/27 1158) Pulse Rate:  [63-80] 69  (02/27 1158) Cardiac Rhythm:  [-] Normal sinus rhythm (02/27 0900) Resp:  [15-25] 20  (02/27 1158) BP: (129-152)/(60-91) 134/60 mmHg (02/27 1158) SpO2:  [94 %-97 %] 97 % (02/27 1158) Weight:  [255 lb 15.3 oz (116.1 kg)] 255 lb 15.3 oz (116.1 kg) (02/26 2359)  Hemodynamic parameters for last 24 hours:    Intake/Output from previous day: 02/26 0701 - 02/27 0700 In: 1340.5 [P.O.:720; I.V.:620.5] Out: 650 [Urine:650] Intake/Output this shift: Total I/O In: 480 [P.O.:480] Out: -   General appearance: alert and no distress Heart: regular rate and rhythm Lungs: clear to auscultation bilaterally unchanged  Lab Results:  Basename 04/29/11 0400 04/28/11 0403  WBC 6.5 6.7  HGB 14.6 14.8  HCT 41.7 41.0  PLT 170 159   BMET:  Basename 04/29/11 0400 04/27/11 2142  NA 141 140  K 3.8 3.9  CL 109 105  CO2 24 24  GLUCOSE 122* 101*  BUN 20 25*  CREATININE 1.10 1.22  CALCIUM 9.6 10.1    PT/INR:  Basename 04/27/11 2142  LABPROT 13.4  INR 1.00   ABG    Component Value Date/Time   PHART 7.404 04/29/2011 1438   HCO3 24.0 04/29/2011 1438   TCO2 25.2 04/29/2011 1438   ACIDBASEDEF 0.2 04/29/2011 1438   O2SAT 95.0 04/29/2011 1438   CBG (last 3)   Basename 04/29/11 1155 04/29/11 0851 04/28/11 2232  GLUCAP 226* 143* 174*    Assessment/Plan: S/P Procedure(s) (LRB): LEFT HEART CATHETERIZATION WITH CORONARY ANGIOGRAM (N/A) For CABG in AM. Carotid duplex- no ICA stenosis PFT's essentially normal Pt aware of risks and benefits All questions answered   LOS: 2 days    Viktorya Arguijo C 04/29/2011

## 2011-04-29 NOTE — Progress Notes (Signed)
Subjective:  Vague chest tightness after he went to pulmonary lab, resolving now.  For CABG in am.  Objective:  Vital Signs in the last 24 hours: BP 134/60  Pulse 69  Temp(Src) 97.9 F (36.6 C) (Oral)  Resp 20  Ht 6' (1.829 m)  Wt 116.1 kg (255 lb 15.3 oz)  BMI 34.71 kg/m2  SpO2 97%  Physical Exam: Pleasant obese WM in NAD Lungs:  Clear to A&P Cardiac:  Regular rhythm, normal S1 and S2, no S3 Abdomen:  Soft, nontender, no masses Extremities:  Cath site clean and dry without hematoma or bruit  Intake/Output from previous day: 02/26 0701 - 02/27 0700 In: 1340.5 [P.O.:720; I.V.:620.5] Out: 650 [Urine:650] Weight change: 0.433 kg (15.3 oz)  Lab Results: Basic Metabolic Panel:  Basename 04/29/11 0400 04/27/11 2142  NA 141 140  K 3.8 3.9  CL 109 105  CO2 24 24  GLUCOSE 122* 101*  BUN 20 25*  CREATININE 1.10 1.22   CBC:  Basename 04/29/11 0400 04/28/11 0403 04/27/11 2142  WBC 6.5 6.7 --  NEUTROABS -- -- 3.0  HGB 14.6 14.8 --  HCT 41.7 41.0 --  MCV 89.5 89.1 --  PLT 170 159 --   Cardiac Enzymes:  Basename 04/28/11 0949 04/28/11 0405 04/27/11 2141  CKTOTAL 43 47 47  CKMB 2.3 2.2 2.2  CKMBINDEX -- -- --  TROPONINI <0.30 <0.30 <0.30    Protime: . Lab Results  Component Value Date   INR 1.00 04/27/2011    Telemetry: Sinus rhythm  Assessment/Plan:  1. UAP with recurrent angina 2. 3 vessel CAD 3. IDDM stable  Rec:  For CABG tomorrow.  Begin IV NTG and check EKG>     W. Ashley Royalty.  MD [facility] 04/29/2011, 12:41 PM

## 2011-04-29 NOTE — Progress Notes (Signed)
At approx. 1100 pt. C/o chest pain 5/10, similar to presenting symptom.  Largely relieved by 2 SL nitro.  Pt. Also given ativan and percocet.  Seen by Dr. Donnie Aho; nitro drip started.

## 2011-04-30 ENCOUNTER — Encounter (HOSPITAL_COMMUNITY): Payer: Self-pay | Admitting: Anesthesiology

## 2011-04-30 ENCOUNTER — Other Ambulatory Visit: Payer: Self-pay

## 2011-04-30 ENCOUNTER — Inpatient Hospital Stay (HOSPITAL_COMMUNITY): Payer: BC Managed Care – PPO

## 2011-04-30 ENCOUNTER — Encounter (HOSPITAL_COMMUNITY)
Admission: EM | Disposition: A | Discharge status: Home / Self Care | Payer: Self-pay | Source: Home / Self Care | Attending: Thoracic Surgery (Cardiothoracic Vascular Surgery)

## 2011-04-30 ENCOUNTER — Inpatient Hospital Stay (HOSPITAL_COMMUNITY): Payer: BC Managed Care – PPO | Admitting: Anesthesiology

## 2011-04-30 DIAGNOSIS — I2510 Atherosclerotic heart disease of native coronary artery without angina pectoris: Secondary | ICD-10-CM

## 2011-04-30 HISTORY — PX: CORONARY ARTERY BYPASS GRAFT: SHX141

## 2011-04-30 HISTORY — PX: RADIAL ARTERY HARVEST: SHX5067

## 2011-04-30 LAB — POCT I-STAT 3, ART BLOOD GAS (G3+)
Acid-base deficit: 3 mmol/L — ABNORMAL HIGH (ref 0.0–2.0)
Acid-base deficit: 3 mmol/L — ABNORMAL HIGH (ref 0.0–2.0)
Acid-base deficit: 5 mmol/L — ABNORMAL HIGH (ref 0.0–2.0)
Acid-base deficit: 1 mmol/L (ref 0.0–2.0)
Acid-base deficit: 2 mmol/L (ref 0.0–2.0)
Acid-base deficit: 3 mmol/L — ABNORMAL HIGH (ref 0.0–2.0)
Bicarbonate: 23.4 mEq/L (ref 20.0–24.0)
Bicarbonate: 22.9 mEq/L (ref 20.0–24.0)
Bicarbonate: 23.5 mEq/L (ref 20.0–24.0)
Bicarbonate: 23.7 mEq/L (ref 20.0–24.0)
Bicarbonate: 22.5 mEq/L (ref 20.0–24.0)
Bicarbonate: 21.2 mEq/L (ref 20.0–24.0)
O2 Saturation: 100 %
O2 Saturation: 77 %
O2 Saturation: 94 %
O2 Saturation: 96 %
O2 Saturation: 95 %
O2 Saturation: 92 %
Patient temperature: 36.3
Patient temperature: 36.7
Patient temperature: 37
Patient temperature: 37.1
TCO2: 25 mmol/L (ref 0–100)
TCO2: 24 mmol/L (ref 0–100)
TCO2: 25 mmol/L (ref 0–100)
TCO2: 25 mmol/L (ref 0–100)
TCO2: 24 mmol/L (ref 0–100)
TCO2: 22 mmol/L (ref 0–100)
pCO2 arterial: 45.8 mmHg — ABNORMAL HIGH (ref 35.0–45.0)
pCO2 arterial: 45.2 mmHg — ABNORMAL HIGH (ref 35.0–45.0)
pCO2 arterial: 53.9 mmHg — ABNORMAL HIGH (ref 35.0–45.0)
pCO2 arterial: 37.8 mmHg (ref 35.0–45.0)
pCO2 arterial: 35.1 mmHg (ref 35.0–45.0)
pCO2 arterial: 34.2 mmHg — ABNORMAL LOW (ref 35.0–45.0)
pH, Arterial: 7.317 — ABNORMAL LOW (ref 7.350–7.450)
pH, Arterial: 7.312 — ABNORMAL LOW (ref 7.350–7.450)
pH, Arterial: 7.244 — ABNORMAL LOW (ref 7.350–7.450)
pH, Arterial: 7.405 (ref 7.350–7.450)
pH, Arterial: 7.414 (ref 7.350–7.450)
pH, Arterial: 7.402 (ref 7.350–7.450)
pO2, Arterial: 259 mmHg — ABNORMAL HIGH (ref 80.0–100.0)
pO2, Arterial: 46 mmHg — ABNORMAL LOW (ref 80.0–100.0)
pO2, Arterial: 83 mmHg (ref 80.0–100.0)
pO2, Arterial: 78 mmHg — ABNORMAL LOW (ref 80.0–100.0)
pO2, Arterial: 73 mmHg — ABNORMAL LOW (ref 80.0–100.0)
pO2, Arterial: 62 mmHg — ABNORMAL LOW (ref 80.0–100.0)

## 2011-04-30 LAB — CBC
HCT: 40.2 % (ref 39.0–52.0)
HCT: 32.6 % — ABNORMAL LOW (ref 39.0–52.0)
HCT: 36.9 % — ABNORMAL LOW (ref 39.0–52.0)
Hemoglobin: 14 g/dL (ref 13.0–17.0)
Hemoglobin: 11.4 g/dL — ABNORMAL LOW (ref 13.0–17.0)
Hemoglobin: 13.1 g/dL (ref 13.0–17.0)
MCH: 31.4 pg (ref 26.0–34.0)
MCH: 31.7 pg (ref 26.0–34.0)
MCH: 31.3 pg (ref 26.0–34.0)
MCHC: 34.8 g/dL (ref 30.0–36.0)
MCHC: 35 g/dL (ref 30.0–36.0)
MCHC: 35.5 g/dL (ref 30.0–36.0)
MCV: 90.1 fL (ref 78.0–100.0)
MCV: 90.6 fL (ref 78.0–100.0)
MCV: 88.1 fL (ref 78.0–100.0)
Platelets: 154 10*3/uL (ref 150–400)
Platelets: 114 10*3/uL — ABNORMAL LOW (ref 150–400)
Platelets: 141 10*3/uL — ABNORMAL LOW (ref 150–400)
RBC: 4.46 MIL/uL (ref 4.22–5.81)
RBC: 3.6 MIL/uL — ABNORMAL LOW (ref 4.22–5.81)
RBC: 4.19 MIL/uL — ABNORMAL LOW (ref 4.22–5.81)
RDW: 13.2 % (ref 11.5–15.5)
RDW: 13.2 % (ref 11.5–15.5)
RDW: 13.1 % (ref 11.5–15.5)
WBC: 6.1 10*3/uL (ref 4.0–10.5)
WBC: 8.8 10*3/uL (ref 4.0–10.5)
WBC: 12.1 10*3/uL — ABNORMAL HIGH (ref 4.0–10.5)

## 2011-04-30 LAB — POCT I-STAT 4, (NA,K, GLUC, HGB,HCT)
Glucose, Bld: 108 mg/dL — ABNORMAL HIGH (ref 70–99)
Glucose, Bld: 120 mg/dL — ABNORMAL HIGH (ref 70–99)
Glucose, Bld: 124 mg/dL — ABNORMAL HIGH (ref 70–99)
Glucose, Bld: 142 mg/dL — ABNORMAL HIGH (ref 70–99)
Glucose, Bld: 244 mg/dL — ABNORMAL HIGH (ref 70–99)
Glucose, Bld: 180 mg/dL — ABNORMAL HIGH (ref 70–99)
HCT: 39 % (ref 39.0–52.0)
HCT: 29 % — ABNORMAL LOW (ref 39.0–52.0)
HCT: 35 % — ABNORMAL LOW (ref 39.0–52.0)
HCT: 29 % — ABNORMAL LOW (ref 39.0–52.0)
HCT: 31 % — ABNORMAL LOW (ref 39.0–52.0)
HCT: 33 % — ABNORMAL LOW (ref 39.0–52.0)
Hemoglobin: 13.3 g/dL (ref 13.0–17.0)
Hemoglobin: 11.9 g/dL — ABNORMAL LOW (ref 13.0–17.0)
Hemoglobin: 9.9 g/dL — ABNORMAL LOW (ref 13.0–17.0)
Hemoglobin: 9.9 g/dL — ABNORMAL LOW (ref 13.0–17.0)
Hemoglobin: 10.5 g/dL — ABNORMAL LOW (ref 13.0–17.0)
Hemoglobin: 11.2 g/dL — ABNORMAL LOW (ref 13.0–17.0)
Potassium: 4.2 mEq/L (ref 3.5–5.1)
Potassium: 4.4 mEq/L (ref 3.5–5.1)
Potassium: 4.4 mEq/L (ref 3.5–5.1)
Potassium: 4.8 mEq/L (ref 3.5–5.1)
Potassium: 5 mEq/L (ref 3.5–5.1)
Potassium: 3.9 mEq/L (ref 3.5–5.1)
Sodium: 140 mEq/L (ref 135–145)
Sodium: 136 mEq/L (ref 135–145)
Sodium: 140 mEq/L (ref 135–145)
Sodium: 137 mEq/L (ref 135–145)
Sodium: 136 mEq/L (ref 135–145)
Sodium: 141 mEq/L (ref 135–145)

## 2011-04-30 LAB — POCT I-STAT, CHEM 8
BUN: 20 mg/dL (ref 6–23)
Calcium, Ion: 1.19 mmol/L (ref 1.12–1.32)
Chloride: 107 mEq/L (ref 96–112)
Creatinine, Ser: 1.1 mg/dL (ref 0.50–1.35)
Glucose, Bld: 116 mg/dL — ABNORMAL HIGH (ref 70–99)
HCT: 37 % — ABNORMAL LOW (ref 39.0–52.0)
Hemoglobin: 12.6 g/dL — ABNORMAL LOW (ref 13.0–17.0)
Potassium: 4.1 mEq/L (ref 3.5–5.1)
Sodium: 140 mEq/L (ref 135–145)
TCO2: 21 mmol/L (ref 0–100)

## 2011-04-30 LAB — MAGNESIUM: Magnesium: 2.5 mg/dL (ref 1.5–2.5)

## 2011-04-30 LAB — HEMOGLOBIN AND HEMATOCRIT, BLOOD
HCT: 29 % — ABNORMAL LOW (ref 39.0–52.0)
Hemoglobin: 10.4 g/dL — ABNORMAL LOW (ref 13.0–17.0)

## 2011-04-30 LAB — GLUCOSE, CAPILLARY
Glucose-Capillary: 102 mg/dL — ABNORMAL HIGH (ref 70–99)
Glucose-Capillary: 161 mg/dL — ABNORMAL HIGH (ref 70–99)
Glucose-Capillary: 142 mg/dL — ABNORMAL HIGH (ref 70–99)
Glucose-Capillary: 124 mg/dL — ABNORMAL HIGH (ref 70–99)
Glucose-Capillary: 114 mg/dL — ABNORMAL HIGH (ref 70–99)
Glucose-Capillary: 106 mg/dL — ABNORMAL HIGH (ref 70–99)
Glucose-Capillary: 111 mg/dL — ABNORMAL HIGH (ref 70–99)
Glucose-Capillary: 95 mg/dL (ref 70–99)
Glucose-Capillary: 98 mg/dL (ref 70–99)

## 2011-04-30 LAB — CREATININE, SERUM
Creatinine, Ser: 1.13 mg/dL (ref 0.50–1.35)
GFR calc Af Amer: 79 mL/min — ABNORMAL LOW (ref >90)
GFR calc non Af Amer: 68 mL/min — ABNORMAL LOW (ref >90)

## 2011-04-30 LAB — HEPARIN LEVEL (UNFRACTIONATED): Heparin Unfractionated: 0.49 IU/mL (ref 0.30–0.70)

## 2011-04-30 LAB — SURGICAL PCR SCREEN
MRSA, PCR: NEGATIVE
Staphylococcus aureus: NEGATIVE

## 2011-04-30 LAB — POCT I-STAT GLUCOSE
Glucose, Bld: 112 mg/dL — ABNORMAL HIGH (ref 70–99)
Operator id: 212621

## 2011-04-30 LAB — PROTIME-INR
INR: 1.27 (ref 0.00–1.49)
Prothrombin Time: 16.2 seconds — ABNORMAL HIGH (ref 11.6–15.2)

## 2011-04-30 LAB — APTT: aPTT: 31 seconds (ref 24–37)

## 2011-04-30 LAB — PLATELET COUNT: Platelets: 120 10*3/uL — ABNORMAL LOW (ref 150–400)

## 2011-04-30 SURGERY — CORONARY ARTERY BYPASS GRAFTING (CABG)
Anesthesia: General | Site: Chest | Wound class: Clean

## 2011-04-30 MED ORDER — PROPOFOL 10 MG/ML IV EMUL
INTRAVENOUS | Status: DC | PRN
  Administered 2011-04-30: 60 mg via INTRAVENOUS
  Administered 2011-04-30: 30 mg via INTRAVENOUS

## 2011-04-30 MED ORDER — MIDAZOLAM HCL 5 MG/5ML IJ SOLN
INTRAMUSCULAR | Status: DC | PRN
  Administered 2011-04-30: 6 mg via INTRAVENOUS
  Administered 2011-04-30 (×2): 2 mg via INTRAVENOUS
  Administered 2011-04-30: 4 mg via INTRAVENOUS
  Administered 2011-04-30: 2 mg via INTRAVENOUS

## 2011-04-30 MED ORDER — HEPARIN SODIUM (PORCINE) 1000 UNIT/ML IJ SOLN
INTRAMUSCULAR | Status: DC | PRN
  Administered 2011-04-30: 35000 [IU] via INTRAVENOUS
  Administered 2011-04-30: 5000 [IU] via INTRAVENOUS

## 2011-04-30 MED ORDER — METOPROLOL TARTRATE 12.5 MG HALF TABLET
12.5000 mg | ORAL_TABLET | Freq: Two times a day (BID) | ORAL | Status: DC
  Administered 2011-05-01 (×2): 12.5 mg via ORAL
  Filled 2011-04-30 (×5): qty 1

## 2011-04-30 MED ORDER — PHENYLEPHRINE HCL 10 MG/ML IJ SOLN
0.0000 ug/min | INTRAVENOUS | Status: DC
  Administered 2011-04-30: 0 ug/min via INTRAVENOUS
  Filled 2011-04-30: qty 2

## 2011-04-30 MED ORDER — LACTATED RINGERS IV SOLN: 500.0000 mL | Freq: Once | INTRAVENOUS | Status: AC | PRN

## 2011-04-30 MED ORDER — MIDAZOLAM HCL 2 MG/2ML IJ SOLN: 2.0000 mg | INTRAMUSCULAR | Status: DC | PRN

## 2011-04-30 MED ORDER — BISACODYL 10 MG RE SUPP: 10.0000 mg | Freq: Every day | RECTAL | Status: DC

## 2011-04-30 MED ORDER — HEMOSTATIC AGENTS (NO CHARGE) OPTIME
TOPICAL | Status: DC | PRN
  Administered 2011-04-30: 3 via TOPICAL

## 2011-04-30 MED ORDER — ACETAMINOPHEN 160 MG/5ML PO SOLN: 650.0000 mg | ORAL | Status: AC

## 2011-04-30 MED ORDER — SODIUM CHLORIDE 0.9 % IV SOLN
0.4000 ug/kg/h | INTRAVENOUS | Status: DC
  Administered 2011-04-30: 0.7 ug/kg/h via INTRAVENOUS
  Filled 2011-04-30: qty 2

## 2011-04-30 MED ORDER — KETOROLAC TROMETHAMINE 30 MG/ML IJ SOLN
INTRAMUSCULAR | Status: AC
  Filled 2011-04-30: qty 1

## 2011-04-30 MED ORDER — DOCUSATE SODIUM 100 MG PO CAPS: 200.0000 mg | ORAL_CAPSULE | Freq: Every day | ORAL | Status: DC

## 2011-04-30 MED ORDER — METOPROLOL TARTRATE 12.5 MG HALF TABLET
12.5000 mg | ORAL_TABLET | Freq: Two times a day (BID) | ORAL | Status: DC
  Filled 2011-04-30: qty 1

## 2011-04-30 MED ORDER — ALBUMIN HUMAN 5 % IV SOLN
250.0000 mL | INTRAVENOUS | Status: DC | PRN
  Administered 2011-04-30: 250 mL via INTRAVENOUS

## 2011-04-30 MED ORDER — SODIUM BICARBONATE 8.4 % IV SOLN
INTRAVENOUS | Status: AC
  Administered 2011-04-30: 100 meq via INTRAVENOUS
  Filled 2011-04-30: qty 100

## 2011-04-30 MED ORDER — KETOROLAC TROMETHAMINE 30 MG/ML IJ SOLN
30.0000 mg | Freq: Once | INTRAMUSCULAR | Status: AC
  Administered 2011-04-30: 30 mg via INTRAVENOUS

## 2011-04-30 MED ORDER — SODIUM CHLORIDE 0.9 % IV SOLN
10.0000 g | INTRAVENOUS | Status: DC | PRN
  Administered 2011-04-30: 5 g/h via INTRAVENOUS

## 2011-04-30 MED ORDER — ACETAMINOPHEN 160 MG/5ML PO SOLN: 975.0000 mg | Freq: Four times a day (QID) | ORAL | Status: DC

## 2011-04-30 MED ORDER — SODIUM CHLORIDE 0.9 % IV SOLN
10.0000 mg | INTRAVENOUS | Status: DC | PRN
  Administered 2011-04-30: 20 ug/min via INTRAVENOUS

## 2011-04-30 MED ORDER — SODIUM CHLORIDE 0.9 % IV SOLN
100.0000 [IU] | INTRAVENOUS | Status: DC | PRN
  Administered 2011-04-30: 1.4 [IU]/h via INTRAVENOUS

## 2011-04-30 MED ORDER — PROTAMINE SULFATE 10 MG/ML IV SOLN
INTRAVENOUS | Status: DC | PRN
  Administered 2011-04-30: 350 mg via INTRAVENOUS

## 2011-04-30 MED ORDER — SODIUM CHLORIDE 0.45 % IV SOLN
INTRAVENOUS | Status: DC
  Administered 2011-04-30: 20 mL/h via INTRAVENOUS

## 2011-04-30 MED ORDER — SODIUM BICARBONATE 8.4 % IV SOLN: 100.0000 meq | Freq: Once | INTRAVENOUS | Status: DC

## 2011-04-30 MED ORDER — ALBUTEROL SULFATE (5 MG/ML) 0.5% IN NEBU: 2.5000 mg | INHALATION_SOLUTION | Freq: Four times a day (QID) | RESPIRATORY_TRACT | Status: DC | PRN

## 2011-04-30 MED ORDER — INSULIN REGULAR BOLUS VIA INFUSION
0.0000 [IU] | Freq: Three times a day (TID) | INTRAVENOUS | Status: DC
  Filled 2011-04-30: qty 10

## 2011-04-30 MED ORDER — MORPHINE SULFATE 2 MG/ML IJ SOLN
1.0000 mg | INTRAMUSCULAR | Status: DC | PRN
  Administered 2011-04-30: 2 mg via INTRAVENOUS

## 2011-04-30 MED ORDER — SODIUM CHLORIDE 0.9 % IV SOLN
0.1000 ug/kg/h | INTRAVENOUS | Status: DC
  Administered 2011-04-30: 0.7 ug/kg/h via INTRAVENOUS
  Filled 2011-04-30 (×2): qty 2

## 2011-04-30 MED ORDER — ACETAMINOPHEN 160 MG/5ML PO SOLN
975.0000 mg | Freq: Four times a day (QID) | ORAL | Status: DC
  Filled 2011-04-30: qty 40.6

## 2011-04-30 MED ORDER — NITROGLYCERIN IN D5W 200-5 MCG/ML-% IV SOLN
0.0000 ug/min | INTRAVENOUS | Status: DC
  Administered 2011-04-30: 15 ug/min via INTRAVENOUS

## 2011-04-30 MED ORDER — METOPROLOL TARTRATE 1 MG/ML IV SOLN: 2.5000 mg | INTRAVENOUS | Status: DC | PRN

## 2011-04-30 MED ORDER — SODIUM CHLORIDE 0.9 % IJ SOLN: 3.0000 mL | Freq: Two times a day (BID) | INTRAMUSCULAR | Status: DC

## 2011-04-30 MED ORDER — MORPHINE SULFATE 2 MG/ML IJ SOLN
INTRAMUSCULAR | Status: AC
  Administered 2011-04-30: 2 mg via INTRAVENOUS
  Filled 2011-04-30: qty 1

## 2011-04-30 MED ORDER — FAMOTIDINE IN NACL 20-0.9 MG/50ML-% IV SOLN
20.0000 mg | Freq: Two times a day (BID) | INTRAVENOUS | Status: DC
  Administered 2011-04-30: 20 mg via INTRAVENOUS

## 2011-04-30 MED ORDER — MORPHINE SULFATE 4 MG/ML IJ SOLN
2.0000 mg | INTRAMUSCULAR | Status: DC | PRN
  Administered 2011-04-30: 4 mg via INTRAVENOUS
  Administered 2011-04-30 (×2): 2 mg via INTRAVENOUS
  Administered 2011-04-30: 4 mg via INTRAVENOUS
  Filled 2011-04-30 (×3): qty 1

## 2011-04-30 MED ORDER — HEMOSTATIC AGENTS (NO CHARGE) OPTIME
TOPICAL | Status: DC | PRN
  Administered 2011-04-30: 2 via TOPICAL

## 2011-04-30 MED ORDER — POTASSIUM CHLORIDE 10 MEQ/50ML IV SOLN
10.0000 meq | INTRAVENOUS | Status: AC
  Administered 2011-04-30 (×3): 10 meq via INTRAVENOUS

## 2011-04-30 MED ORDER — SODIUM BICARBONATE 8.4 % IV SOLN
100.0000 meq | Freq: Once | INTRAVENOUS | Status: AC
  Administered 2011-04-30: 100 meq via INTRAVENOUS

## 2011-04-30 MED ORDER — NITROGLYCERIN IN D5W 200-5 MCG/ML-% IV SOLN: 7.0000 ug/min | INTRAVENOUS | Status: DC

## 2011-04-30 MED ORDER — ACETAMINOPHEN 500 MG PO TABS: 1000.0000 mg | ORAL_TABLET | Freq: Four times a day (QID) | ORAL | Status: DC

## 2011-04-30 MED ORDER — SODIUM CHLORIDE 0.9 % IV SOLN: INTRAVENOUS | Status: DC

## 2011-04-30 MED ORDER — ONDANSETRON HCL 4 MG/2ML IJ SOLN
4.0000 mg | Freq: Four times a day (QID) | INTRAMUSCULAR | Status: DC | PRN
  Administered 2011-05-02 – 2011-05-04 (×2): 4 mg via INTRAVENOUS
  Filled 2011-04-30 (×2): qty 2

## 2011-04-30 MED ORDER — NITROGLYCERIN IN D5W 200-5 MCG/ML-% IV SOLN
INTRAVENOUS | Status: DC | PRN
  Administered 2011-04-30: 16.6 ug/min via INTRAVENOUS

## 2011-04-30 MED ORDER — METOPROLOL TARTRATE 25 MG/10 ML ORAL SUSPENSION
12.5000 mg | Freq: Two times a day (BID) | ORAL | Status: DC
  Filled 2011-04-30 (×5): qty 5

## 2011-04-30 MED ORDER — DEXTROSE 5 % IV SOLN
1.5000 g | Freq: Two times a day (BID) | INTRAVENOUS | Status: DC
  Administered 2011-04-30: 1.5 g via INTRAVENOUS
  Filled 2011-04-30 (×2): qty 1.5

## 2011-04-30 MED ORDER — ACETAMINOPHEN 650 MG RE SUPP
650.0000 mg | RECTAL | Status: AC
  Administered 2011-04-30: 650 mg via RECTAL

## 2011-04-30 MED ORDER — ROCURONIUM BROMIDE 100 MG/10ML IV SOLN
INTRAVENOUS | Status: DC | PRN
  Administered 2011-04-30: 30 mg via INTRAVENOUS
  Administered 2011-04-30: 10 mg via INTRAVENOUS
  Administered 2011-04-30: 20 mg via INTRAVENOUS
  Administered 2011-04-30: 10 mg via INTRAVENOUS

## 2011-04-30 MED ORDER — 0.9 % SODIUM CHLORIDE (POUR BTL) OPTIME
TOPICAL | Status: DC | PRN
  Administered 2011-04-30: 6000 mL

## 2011-04-30 MED ORDER — VANCOMYCIN HCL 1000 MG IV SOLR
1000.0000 mg | Freq: Once | INTRAVENOUS | Status: AC
  Administered 2011-04-30: 1000 mg via INTRAVENOUS
  Filled 2011-04-30: qty 1000

## 2011-04-30 MED ORDER — PANTOPRAZOLE SODIUM 40 MG PO TBEC
40.0000 mg | DELAYED_RELEASE_TABLET | Freq: Every day | ORAL | Status: DC
  Administered 2011-05-02 – 2011-05-05 (×4): 40 mg via ORAL
  Filled 2011-04-30 (×4): qty 1

## 2011-04-30 MED ORDER — ASPIRIN EC 325 MG PO TBEC
325.0000 mg | DELAYED_RELEASE_TABLET | Freq: Every day | ORAL | Status: DC
  Administered 2011-05-01 – 2011-05-05 (×5): 325 mg via ORAL
  Filled 2011-04-30 (×5): qty 1

## 2011-04-30 MED ORDER — LACTATED RINGERS IV SOLN
INTRAVENOUS | Status: DC
  Administered 2011-04-30 (×2): 20 mL/h via INTRAVENOUS

## 2011-04-30 MED ORDER — LACTATED RINGERS IV SOLN
INTRAVENOUS | Status: DC | PRN
  Administered 2011-04-30: 06:00:00 via INTRAVENOUS

## 2011-04-30 MED ORDER — FENTANYL CITRATE 0.05 MG/ML IJ SOLN
INTRAMUSCULAR | Status: DC | PRN
  Administered 2011-04-30: 50 ug via INTRAVENOUS
  Administered 2011-04-30: 100 ug via INTRAVENOUS
  Administered 2011-04-30: 50 ug via INTRAVENOUS
  Administered 2011-04-30: 500 ug via INTRAVENOUS
  Administered 2011-04-30: 100 ug via INTRAVENOUS
  Administered 2011-04-30: 150 ug via INTRAVENOUS
  Administered 2011-04-30: 100 ug via INTRAVENOUS
  Administered 2011-04-30: 150 ug via INTRAVENOUS
  Administered 2011-04-30: 100 ug via INTRAVENOUS
  Administered 2011-04-30: 50 ug via INTRAVENOUS
  Administered 2011-04-30 (×2): 100 ug via INTRAVENOUS
  Administered 2011-04-30: 50 ug via INTRAVENOUS
  Administered 2011-04-30 (×2): 150 ug via INTRAVENOUS
  Administered 2011-04-30: 100 ug via INTRAVENOUS
  Administered 2011-04-30: 150 ug via INTRAVENOUS

## 2011-04-30 MED ORDER — SODIUM CHLORIDE 0.9 % IJ SOLN: 3.0000 mL | INTRAMUSCULAR | Status: DC | PRN

## 2011-04-30 MED ORDER — ASPIRIN 81 MG PO CHEW: 324.0000 mg | CHEWABLE_TABLET | Freq: Every day | ORAL | Status: DC

## 2011-04-30 MED ORDER — SODIUM CHLORIDE 0.9 % IV SOLN
200.0000 ug | INTRAVENOUS | Status: DC | PRN
  Administered 2011-04-30: 0.2 ug/kg/h via INTRAVENOUS

## 2011-04-30 MED ORDER — ACETAMINOPHEN 500 MG PO TABS
1000.0000 mg | ORAL_TABLET | Freq: Four times a day (QID) | ORAL | Status: DC
  Administered 2011-05-01 – 2011-05-02 (×5): 1000 mg via ORAL
  Administered 2011-05-02: 500 mg via ORAL
  Administered 2011-05-02 – 2011-05-05 (×6): 1000 mg via ORAL
  Filled 2011-04-30 (×19): qty 2

## 2011-04-30 MED ORDER — METOPROLOL TARTRATE 25 MG/10 ML ORAL SUSPENSION
12.5000 mg | Freq: Two times a day (BID) | ORAL | Status: DC
  Filled 2011-04-30: qty 5

## 2011-04-30 MED ORDER — MAGNESIUM SULFATE 40 MG/ML IJ SOLN
4.0000 g | Freq: Once | INTRAMUSCULAR | Status: AC
  Administered 2011-04-30: 4 g via INTRAVENOUS
  Filled 2011-04-30: qty 100

## 2011-04-30 MED ORDER — CALCIUM CHLORIDE 10 % IV SOLN
INTRAVENOUS | Status: DC | PRN
  Administered 2011-04-30: .3 g via INTRAVENOUS

## 2011-04-30 MED ORDER — VECURONIUM BROMIDE 10 MG IV SOLR
INTRAVENOUS | Status: DC | PRN
  Administered 2011-04-30 (×2): 10 mg via INTRAVENOUS

## 2011-04-30 MED ORDER — SODIUM CHLORIDE 0.9 % IV SOLN
INTRAVENOUS | Status: DC | PRN
  Administered 2011-04-30: 12:00:00 via INTRAVENOUS

## 2011-04-30 MED ORDER — SODIUM CHLORIDE 0.9 % IV SOLN
250.0000 mL | INTRAVENOUS | Status: DC
  Administered 2011-04-30: 250 mL via INTRAVENOUS

## 2011-04-30 MED ORDER — SODIUM CHLORIDE 0.9 % IV SOLN
1.0000 g/h | INTRAVENOUS | Status: DC
  Filled 2011-04-30: qty 20

## 2011-04-30 MED ORDER — OXYCODONE HCL 5 MG PO TABS
5.0000 mg | ORAL_TABLET | ORAL | Status: DC | PRN
  Administered 2011-04-30: 10 mg via ORAL
  Administered 2011-05-01 (×2): 5 mg via ORAL
  Administered 2011-05-01: 10 mg via ORAL
  Administered 2011-05-01: 5 mg via ORAL
  Administered 2011-05-01: 10 mg via ORAL
  Administered 2011-05-01: 5 mg via ORAL
  Administered 2011-05-01: 10 mg via ORAL
  Administered 2011-05-01 – 2011-05-02 (×3): 5 mg via ORAL
  Administered 2011-05-02: 10 mg via ORAL
  Administered 2011-05-02: 5 mg via ORAL
  Administered 2011-05-02 – 2011-05-04 (×9): 10 mg via ORAL
  Filled 2011-04-30: qty 1
  Filled 2011-04-30 (×3): qty 2
  Filled 2011-04-30: qty 1
  Filled 2011-04-30 (×4): qty 2
  Filled 2011-04-30 (×2): qty 1
  Filled 2011-04-30: qty 2
  Filled 2011-04-30: qty 1
  Filled 2011-04-30 (×9): qty 2

## 2011-04-30 MED ORDER — BISACODYL 5 MG PO TBEC
10.0000 mg | DELAYED_RELEASE_TABLET | Freq: Every day | ORAL | Status: DC
  Administered 2011-05-01 – 2011-05-03 (×3): 10 mg via ORAL
  Filled 2011-04-30 (×2): qty 2
  Filled 2011-04-30: qty 1

## 2011-04-30 MED ORDER — SODIUM CHLORIDE 0.9 % IV SOLN
INTRAVENOUS | Status: DC
  Administered 2011-04-30: 6 [IU]/h via INTRAVENOUS
  Administered 2011-05-01: via INTRAVENOUS
  Filled 2011-04-30: qty 1

## 2011-04-30 SURGICAL SUPPLY — 108 items
ADAPTER CARDIO PERF ANTE/RETRO (ADAPTER) IMPLANT
ADPR PRFSN 84XANTGRD RTRGD (ADAPTER)
APPLIER CLIP 9.375 SM OPEN (CLIP)
APR CLP SM 9.3 20 MLT OPN (CLIP)
ATTRACTOMAT 16X20 MAGNETIC DRP (DRAPES) ×3 IMPLANT
BAG DECANTER FOR FLEXI CONT (MISCELLANEOUS) ×3 IMPLANT
BANDAGE ELASTIC 4 VELCRO ST LF (GAUZE/BANDAGES/DRESSINGS) ×3 IMPLANT
BANDAGE ELASTIC 6 VELCRO ST LF (GAUZE/BANDAGES/DRESSINGS) ×3 IMPLANT
BANDAGE GAUZE ELAST BULKY 4 IN (GAUZE/BANDAGES/DRESSINGS) ×3 IMPLANT
BASKET HEART (ORDER IN 25'S) (MISCELLANEOUS) ×1
BASKET HEART (ORDER IN 25S) (MISCELLANEOUS) ×2 IMPLANT
BLADE MINI RND TIP GREEN BEAV (BLADE) ×1 IMPLANT
BLADE SAW STERNAL (BLADE) ×3 IMPLANT
BLADE SURG 11 STRL SS (BLADE) ×1 IMPLANT
BLADE SURG 15 STRL LF DISP TIS (BLADE) IMPLANT
BLADE SURG 15 STRL SS (BLADE) ×3
CANISTER SUCTION 2500CC (MISCELLANEOUS) ×3 IMPLANT
CANN PRFSN .5XCNCT 15X34-48 (MISCELLANEOUS) ×2
CANNULA GUNDRY RCSP 15FR (MISCELLANEOUS) IMPLANT
CANNULA PRFSN .5XCNCT 15X34-48 (MISCELLANEOUS) ×2 IMPLANT
CANNULA VEN 2 STAGE (MISCELLANEOUS) ×3
CATH CPB KIT HENDRICKSON (MISCELLANEOUS) ×3 IMPLANT
CATH ROBINSON RED A/P 18FR (CATHETERS) ×6 IMPLANT
CATH THORACIC 36FR (CATHETERS) ×1 IMPLANT
CATH THORACIC 36FR RT ANG (CATHETERS) ×1 IMPLANT
CLIP APPLIE 9.375 SM OPEN (CLIP) IMPLANT
CLIP FOGARTY SPRING 6M (CLIP) ×1 IMPLANT
CLIP TI MEDIUM 24 (CLIP) ×1 IMPLANT
CLIP TI WIDE RED SMALL 24 (CLIP) ×4 IMPLANT
CLOTH BEACON ORANGE TIMEOUT ST (SAFETY) ×3 IMPLANT
CONN Y 3/8X3/8X3/8  BEN (MISCELLANEOUS)
CONN Y 3/8X3/8X3/8 BEN (MISCELLANEOUS) IMPLANT
COVER MAYO STAND STRL (DRAPES) IMPLANT
COVER SURGICAL LIGHT HANDLE (MISCELLANEOUS) ×6 IMPLANT
CRADLE DONUT ADULT HEAD (MISCELLANEOUS) ×3 IMPLANT
DRAPE CARDIOVASCULAR INCISE (DRAPES) ×3
DRAPE EXTREMITY T 121X128X90 (DRAPE) IMPLANT
DRAPE PROXIMA HALF (DRAPES) IMPLANT
DRAPE SLUSH/WARMER DISC (DRAPES) IMPLANT
DRAPE SRG 135X102X78XABS (DRAPES) ×2 IMPLANT
DRSG COVADERM 4X14 (GAUZE/BANDAGES/DRESSINGS) ×3 IMPLANT
ELECT PAD GROUND ADT 9 (MISCELLANEOUS) IMPLANT
ELECT REM PT RETURN 9FT ADLT (ELECTROSURGICAL) ×6
ELECTRODE REM PT RTRN 9FT ADLT (ELECTROSURGICAL) ×4 IMPLANT
GEL ULTRASOUND 20GR AQUASONIC (MISCELLANEOUS) IMPLANT
GLOVE BIO SURGEON STRL SZ 6 (GLOVE) ×4 IMPLANT
GLOVE BIO SURGEON STRL SZ 6.5 (GLOVE) ×1 IMPLANT
GLOVE BIO SURGEON STRL SZ7.5 (GLOVE) ×1 IMPLANT
GLOVE BIOGEL PI IND STRL 6 (GLOVE) IMPLANT
GLOVE BIOGEL PI IND STRL 7.0 (GLOVE) IMPLANT
GLOVE BIOGEL PI INDICATOR 6 (GLOVE) ×2
GLOVE BIOGEL PI INDICATOR 7.0 (GLOVE) ×5
GLOVE EUDERMIC 7 POWDERFREE (GLOVE) ×9 IMPLANT
GOWN PREVENTION PLUS XLARGE (GOWN DISPOSABLE) ×10 IMPLANT
GOWN STRL NON-REIN LRG LVL3 (GOWN DISPOSABLE) ×13 IMPLANT
HARMONIC SHEARS 14CM COAG (MISCELLANEOUS) ×3 IMPLANT
HEMOSTAT POWDER SURGIFOAM 1G (HEMOSTASIS) ×9 IMPLANT
HEMOSTAT SURGICEL 2X14 (HEMOSTASIS) ×4 IMPLANT
INSERT FOGARTY XLG (MISCELLANEOUS) ×1 IMPLANT
KIT BASIN OR (CUSTOM PROCEDURE TRAY) ×3 IMPLANT
KIT PAIN CUSTOM (MISCELLANEOUS) IMPLANT
KIT ROOM TURNOVER OR (KITS) ×3 IMPLANT
KIT SUCTION CATH 14FR (SUCTIONS) ×6 IMPLANT
KIT VASOVIEW 6 PRO VH 2400 (KITS) ×3 IMPLANT
MARKER GRAFT CORONARY BYPASS (MISCELLANEOUS) ×9 IMPLANT
NS IRRIG 1000ML POUR BTL (IV SOLUTION) ×16 IMPLANT
PACK OPEN HEART (CUSTOM PROCEDURE TRAY) ×3 IMPLANT
PAD ARMBOARD 7.5X6 YLW CONV (MISCELLANEOUS) ×6 IMPLANT
PENCIL BUTTON HOLSTER BLD 10FT (ELECTRODE) ×3 IMPLANT
PUNCH AORTIC ROTATE 4.0MM (MISCELLANEOUS) IMPLANT
PUNCH AORTIC ROTATE 4.5MM 8IN (MISCELLANEOUS) ×1 IMPLANT
PUNCH AORTIC ROTATE 5MM 8IN (MISCELLANEOUS) IMPLANT
SET CARDIOPLEGIA MPS 5001102 (MISCELLANEOUS) ×1 IMPLANT
SET Y EXTENSION LINE CSP (IV SETS) ×1 IMPLANT
SHEARS HARMONIC 9CM CVD (BLADE) IMPLANT
SPONGE GAUZE 4X4 12PLY (GAUZE/BANDAGES/DRESSINGS) ×5 IMPLANT
SPONGE LAP 18X18 X RAY DECT (DISPOSABLE) ×3 IMPLANT
SUT MNCRL AB 4-0 PS2 18 (SUTURE) ×1 IMPLANT
SUT PROLENE 3 0 SH DA (SUTURE) ×3 IMPLANT
SUT PROLENE 4 0 RB 1 (SUTURE) ×3
SUT PROLENE 4 0 SH DA (SUTURE) IMPLANT
SUT PROLENE 4-0 RB1 .5 CRCL 36 (SUTURE) IMPLANT
SUT PROLENE 6 0 C 1 30 (SUTURE) ×7 IMPLANT
SUT PROLENE 7 0 BV1 MDA (SUTURE) ×4 IMPLANT
SUT PROLENE 8 0 BV175 6 (SUTURE) ×2 IMPLANT
SUT SILK  1 MH (SUTURE) ×1
SUT SILK 1 MH (SUTURE) ×2 IMPLANT
SUT STEEL 6MS V (SUTURE) ×1 IMPLANT
SUT STEEL STERNAL CCS#1 18IN (SUTURE) IMPLANT
SUT STEEL SZ 6 DBL 3X14 BALL (SUTURE) ×1 IMPLANT
SUT VIC AB 1 CTX 36 (SUTURE) ×6
SUT VIC AB 1 CTX36XBRD ANBCTR (SUTURE) ×4 IMPLANT
SUT VIC AB 2-0 CT1 27 (SUTURE) ×6
SUT VIC AB 2-0 CT1 TAPERPNT 27 (SUTURE) IMPLANT
SUT VIC AB 2-0 CTX 27 (SUTURE) IMPLANT
SUT VIC AB 3-0 SH 27 (SUTURE)
SUT VIC AB 3-0 SH 27X BRD (SUTURE) IMPLANT
SUT VIC AB 3-0 X1 27 (SUTURE) ×1 IMPLANT
SUT VICRYL 4-0 PS2 18IN ABS (SUTURE) IMPLANT
SUTURE E-PAK OPEN HEART (SUTURE) ×3 IMPLANT
SYR 50ML SLIP (SYRINGE) IMPLANT
SYSTEM SAHARA CHEST DRAIN ATS (WOUND CARE) ×3 IMPLANT
TOWEL OR 17X24 6PK STRL BLUE (TOWEL DISPOSABLE) ×6 IMPLANT
TOWEL OR 17X26 10 PK STRL BLUE (TOWEL DISPOSABLE) ×9 IMPLANT
TRAY FOLEY IC TEMP SENS 14FR (CATHETERS) ×3 IMPLANT
TUBING INSUFFLATION 10FT LAP (TUBING) ×3 IMPLANT
UNDERPAD 30X30 INCONTINENT (UNDERPADS AND DIAPERS) ×3 IMPLANT
WATER STERILE IRR 1000ML POUR (IV SOLUTION) ×6 IMPLANT

## 2011-04-30 NOTE — Brief Op Note (Addendum)
                   [address]            [place] [address]          [phone]    04/27/2011 - 04/30/2011  11:23 AM  PATIENT:  Syliva Overman  61 y.o. male  PRE-OPERATIVE DIAGNOSIS:  Coronary Artery Disease  POST-OPERATIVE DIAGNOSIS:  Coronary Artery Disease  PROCEDURE:  Procedure(s): CORONARY ARTERY BYPASS GRAFTING (CABG)x4 (LIMA-LAD; LEFT RADIAL-OM1; SVG-DIAG; SVG-PDA) RADIAL ARTERY HARVEST(LEFT) EVH RIGHT THIGH  SURGEON:  Surgeon(s): Loreli Slot, MD  PHYSICIAN ASSISTANT: WAYNE GOLD PA-C  ASSISTANT:RONNIE SCOTT CSFA  ANESTHESIA:   general  PATIENT CONDITION:  ICU - intubated and hemodynamically stable.  PRE-OPERATIVE WEIGHT: 115kg  COMPLICATIONS: NO KNOWN    XC- 80 min CPB- 107 min  Good Targets, good conduits

## 2011-04-30 NOTE — OR Nursing (Signed)
First timeout performed with PA at 0836 prior to left arm incision at 0837. Right leg incision made at 0839. Second timeout performed with surgeon at 0845 prior to chest incision at [phone].

## 2011-04-30 NOTE — Anesthesia Postprocedure Evaluation (Signed)
  Anesthesia Post-op Note  Patient: Mitchell Lambert  Procedure(s) Performed: Procedure(s) (LRB): CORONARY ARTERY BYPASS GRAFTING (CABG) (N/A) RADIAL ARTERY HARVEST (Left)  Patient Location: SICU  Anesthesia Type: General  Level of Consciousness: sedated and unresponsive  Airway and Oxygen Therapy: Patient remains intubated per anesthesia plan and Patient placed on Ventilator (see vital sign flow sheet for setting)  Post-op Pain: none  Post-op Assessment: Post-op Vital signs reviewed, Patient's Cardiovascular Status Stable, Respiratory Function Stable, Patent Airway, No signs of Nausea or vomiting and Pain level controlled  Post-op Vital Signs: stable  Complications: No apparent anesthesia complications

## 2011-04-30 NOTE — OR Nursing (Signed)
Made call to volunteer desk to inform family patient is off pump at 1207. Made first call to 2300 SICU at 1208. Made second call to 2300 SICU at 1228.

## 2011-04-30 NOTE — Procedures (Signed)
Extubation Procedure Note  Patient Details:   Name: Mitchell Lambert DOB: Jan 14, 1951 MRN: [id number]   Airway Documentation:     Evaluation  O2 sats: stable throughout and currently acceptable Complications: No apparent complications Patient  tolerate procedure well. Bilateral Breath Sounds: Clear   Pt awake and alert,  extubated per protocol, placed on 3L , sat 95%, positive cuff leak, BBS cl, VC 850, NIF -25, able to vocalize, IS 750.  Arloa Koh 04/30/2011, 6:25 PM

## 2011-04-30 NOTE — Transfer of Care (Signed)
Immediate Anesthesia Transfer of Care Note  Patient: Mitchell Lambert  Procedure(s) Performed: Procedure(s) (LRB): CORONARY ARTERY BYPASS GRAFTING (CABG) (N/A) RADIAL ARTERY HARVEST (Left)  Patient Location: PACU and ICU  Anesthesia Type: General  Level of Consciousness: unresponsive  Airway & Oxygen Therapy: Patient placed on Ventilator (see vital sign flow sheet for setting)  Post-op Assessment: Post -op Vital signs reviewed and stable  Post vital signs: Reviewed and stable  Complications: No apparent anesthesia complications

## 2011-04-30 NOTE — Preoperative (Signed)
Beta Blockers   Reason not to administer Beta Blockers:beta blocker given last 04/29/11 at 1923, held by [facility] D/T potential for bradycardia during surgery HR 59

## 2011-04-30 NOTE — Plan of Care (Signed)
Problem: Phase II Progression Outcomes Goal: Patient extubated within - Outcome: Completed/Met Date Met:  04/30/11 6 hours

## 2011-04-30 NOTE — Anesthesia Preprocedure Evaluation (Signed)
Anesthesia Evaluation  Patient identified by MRN, date of birth, ID band Patient awake    Reviewed: Allergy & Precautions, H&P , NPO status , Patient's Chart, lab work & pertinent test results  Airway       Dental   Pulmonary          Cardiovascular Exercise Tolerance: Poor hypertension, + angina + CAD     Neuro/Psych  Headaches,    GI/Hepatic   Endo/Other  Diabetes mellitus-, Type 1, Insulin DependentMorbid obesity  Renal/GU      Musculoskeletal   Abdominal   Peds  Hematology   Anesthesia Other Findings   Reproductive/Obstetrics                           Anesthesia Physical Anesthesia Plan  ASA: IV  Anesthesia Plan: General   Post-op Pain Management:    Induction: Intravenous  Airway Management Planned: Oral ETT  Additional Equipment: Arterial line, CVP and PA Cath  Intra-op Plan:   Post-operative Plan: Post-operative intubation/ventilation  Informed Consent: I have reviewed the patients History and Physical, chart, labs and discussed the procedure including the risks, benefits and alternatives for the proposed anesthesia with the patient or authorized representative who has indicated his/her understanding and acceptance.     Plan Discussed with: CRNA, Anesthesiologist and Surgeon  Anesthesia Plan Comments:         Anesthesia Quick Evaluation

## 2011-04-30 NOTE — Progress Notes (Signed)
TCTS BRIEF SICU PROGRESS NOTE  Day of Surgery  S/P Procedure(s) (LRB): CORONARY ARTERY BYPASS GRAFTING (CABG) (N/A) RADIAL ARTERY HARVEST (Left)   Sedated on vent NSR with stable hemodynamics, no drips except NTG Chest tube output low UOP 125-175 mL/hr Labs okay  Plan: Continue routine early postop  Lashauna Arpin H 04/30/2011 5:24 PM

## 2011-05-01 ENCOUNTER — Inpatient Hospital Stay (HOSPITAL_COMMUNITY): Payer: BC Managed Care – PPO

## 2011-05-01 LAB — GLUCOSE, CAPILLARY
Glucose-Capillary: 106 mg/dL — ABNORMAL HIGH (ref 70–99)
Glucose-Capillary: 110 mg/dL — ABNORMAL HIGH (ref 70–99)
Glucose-Capillary: 120 mg/dL — ABNORMAL HIGH (ref 70–99)
Glucose-Capillary: 169 mg/dL — ABNORMAL HIGH (ref 70–99)
Glucose-Capillary: 177 mg/dL — ABNORMAL HIGH (ref 70–99)
Glucose-Capillary: 201 mg/dL — ABNORMAL HIGH (ref 70–99)
Glucose-Capillary: 80 mg/dL (ref 70–99)
Glucose-Capillary: 80 mg/dL (ref 70–99)
Glucose-Capillary: 85 mg/dL (ref 70–99)
Glucose-Capillary: 90 mg/dL (ref 70–99)

## 2011-05-01 LAB — CBC
HCT: 34.6 % — ABNORMAL LOW (ref 39.0–52.0)
Hemoglobin: 12.3 g/dL — ABNORMAL LOW (ref 13.0–17.0)
MCH: 31.9 pg (ref 26.0–34.0)
MCHC: 35.5 g/dL (ref 30.0–36.0)
MCV: 89.9 fL (ref 78.0–100.0)
Platelets: 122 10*3/uL — ABNORMAL LOW (ref 150–400)
RBC: 3.85 MIL/uL — ABNORMAL LOW (ref 4.22–5.81)
RDW: 13.4 % (ref 11.5–15.5)
WBC: 10.4 10*3/uL (ref 4.0–10.5)

## 2011-05-01 LAB — BASIC METABOLIC PANEL
BUN: 24 mg/dL — ABNORMAL HIGH (ref 6–23)
CO2: 24 mEq/L (ref 19–32)
Calcium: 8.2 mg/dL — ABNORMAL LOW (ref 8.4–10.5)
Chloride: 107 mEq/L (ref 96–112)
Creatinine, Ser: 1.22 mg/dL (ref 0.50–1.35)
GFR calc Af Amer: 72 mL/min — ABNORMAL LOW (ref >90)
GFR calc non Af Amer: 62 mL/min — ABNORMAL LOW (ref >90)
Glucose, Bld: 94 mg/dL (ref 70–99)
Potassium: 4.6 mEq/L (ref 3.5–5.1)
Sodium: 138 mEq/L (ref 135–145)

## 2011-05-01 LAB — MAGNESIUM: Magnesium: 2.3 mg/dL (ref 1.5–2.5)

## 2011-05-01 MED ORDER — INSULIN ASPART 100 UNIT/ML ~~LOC~~ SOLN: 0.0000 [IU] | SUBCUTANEOUS | Status: DC

## 2011-05-01 MED ORDER — KETOROLAC TROMETHAMINE 15 MG/ML IJ SOLN
15.0000 mg | Freq: Once | INTRAMUSCULAR | Status: AC
  Administered 2011-05-01: 15 mg via INTRAVENOUS

## 2011-05-01 MED ORDER — SODIUM CHLORIDE 0.9 % IJ SOLN
3.0000 mL | INTRAMUSCULAR | Status: DC | PRN
  Administered 2011-05-02: 3 mL via INTRAVENOUS

## 2011-05-01 MED ORDER — POTASSIUM CHLORIDE CRYS ER 20 MEQ PO TBCR
40.0000 meq | EXTENDED_RELEASE_TABLET | Freq: Every day | ORAL | Status: DC
  Administered 2011-05-01: 40 meq via ORAL
  Filled 2011-05-01 (×2): qty 2

## 2011-05-01 MED ORDER — INSULIN GLARGINE 100 UNIT/ML ~~LOC~~ SOLN
40.0000 [IU] | Freq: Every day | SUBCUTANEOUS | Status: DC
  Administered 2011-05-01 – 2011-05-02 (×2): 40 [IU] via SUBCUTANEOUS
  Filled 2011-05-01: qty 3

## 2011-05-01 MED ORDER — SODIUM CHLORIDE 0.9 % IV SOLN: 250.0000 mL | INTRAVENOUS | Status: DC | PRN

## 2011-05-01 MED ORDER — MOVING RIGHT ALONG BOOK
Freq: Once | Status: AC
  Administered 2011-05-01: 08:00:00
  Filled 2011-05-01: qty 1

## 2011-05-01 MED ORDER — SODIUM CHLORIDE 0.9 % IJ SOLN
3.0000 mL | Freq: Two times a day (BID) | INTRAMUSCULAR | Status: DC
  Administered 2011-05-01 – 2011-05-05 (×9): 3 mL via INTRAVENOUS

## 2011-05-01 MED ORDER — ISOSORBIDE MONONITRATE ER 30 MG PO TB24
30.0000 mg | ORAL_TABLET | Freq: Every day | ORAL | Status: DC
  Administered 2011-05-01 – 2011-05-05 (×5): 30 mg via ORAL
  Filled 2011-05-01 (×5): qty 1

## 2011-05-01 MED ORDER — ENOXAPARIN SODIUM 40 MG/0.4ML ~~LOC~~ SOLN
40.0000 mg | SUBCUTANEOUS | Status: DC
  Administered 2011-05-01 – 2011-05-05 (×5): 40 mg via SUBCUTANEOUS
  Filled 2011-05-01 (×6): qty 0.4

## 2011-05-01 MED ORDER — FUROSEMIDE 40 MG PO TABS
40.0000 mg | ORAL_TABLET | Freq: Every day | ORAL | Status: DC
  Administered 2011-05-01 – 2011-05-05 (×5): 40 mg via ORAL
  Filled 2011-05-01 (×5): qty 1

## 2011-05-01 MED ORDER — INSULIN ASPART 100 UNIT/ML ~~LOC~~ SOLN
0.0000 [IU] | Freq: Three times a day (TID) | SUBCUTANEOUS | Status: DC
  Administered 2011-05-01 – 2011-05-03 (×7): 4 [IU] via SUBCUTANEOUS
  Administered 2011-05-03 (×2): 8 [IU] via SUBCUTANEOUS
  Administered 2011-05-04: 4 [IU] via SUBCUTANEOUS
  Administered 2011-05-04: 2 [IU] via SUBCUTANEOUS
  Administered 2011-05-04: 12 [IU] via SUBCUTANEOUS
  Administered 2011-05-04: 4 [IU] via SUBCUTANEOUS
  Administered 2011-05-05: 12 [IU] via SUBCUTANEOUS
  Administered 2011-05-05: 8 [IU] via SUBCUTANEOUS
  Filled 2011-05-01: qty 3

## 2011-05-01 MED ORDER — INSULIN ASPART 100 UNIT/ML ~~LOC~~ SOLN
4.0000 [IU] | Freq: Three times a day (TID) | SUBCUTANEOUS | Status: DC
Start: 2011-05-01 — End: 2011-05-04
  Administered 2011-05-01 – 2011-05-04 (×8): 4 [IU] via SUBCUTANEOUS
  Filled 2011-05-01: qty 3

## 2011-05-01 MED ORDER — ALUM & MAG HYDROXIDE-SIMETH 200-200-20 MG/5ML PO SUSP: 15.0000 mL | ORAL | Status: DC | PRN

## 2011-05-01 MED ORDER — OMEGA-3-ACID ETHYL ESTERS 1 G PO CAPS
1.0000 g | ORAL_CAPSULE | Freq: Two times a day (BID) | ORAL | Status: DC
  Administered 2011-05-01 – 2011-05-05 (×9): 1 g via ORAL
  Filled 2011-05-01 (×11): qty 1

## 2011-05-01 MED ORDER — ZOLPIDEM TARTRATE 5 MG PO TABS: 10.0000 mg | ORAL_TABLET | Freq: Every evening | ORAL | Status: DC | PRN

## 2011-05-01 MED ORDER — MAGNESIUM HYDROXIDE 400 MG/5ML PO SUSP
30.0000 mL | Freq: Every day | ORAL | Status: DC | PRN
  Administered 2011-05-02 – 2011-05-04 (×2): 30 mL via ORAL
  Filled 2011-05-01 (×2): qty 30

## 2011-05-01 MED ORDER — FUROSEMIDE 10 MG/ML IJ SOLN
40.0000 mg | Freq: Once | INTRAMUSCULAR | Status: DC
  Filled 2011-05-01: qty 4

## 2011-05-01 MED ORDER — TRAMADOL HCL 50 MG PO TABS
100.0000 mg | ORAL_TABLET | ORAL | Status: DC | PRN
  Administered 2011-05-01 – 2011-05-02 (×3): 100 mg via ORAL
  Filled 2011-05-01 (×3): qty 2

## 2011-05-01 MED ORDER — SIMVASTATIN 40 MG PO TABS
40.0000 mg | ORAL_TABLET | Freq: Every day | ORAL | Status: DC
  Administered 2011-05-01 – 2011-05-04 (×4): 40 mg via ORAL
  Filled 2011-05-01 (×5): qty 1

## 2011-05-01 MED ORDER — ALLOPURINOL 300 MG PO TABS
300.0000 mg | ORAL_TABLET | Freq: Every day | ORAL | Status: DC
  Administered 2011-05-01 – 2011-05-05 (×5): 300 mg via ORAL
  Filled 2011-05-01 (×5): qty 1

## 2011-05-01 MED ORDER — KETOROLAC TROMETHAMINE 15 MG/ML IJ SOLN
INTRAMUSCULAR | Status: AC
  Administered 2011-05-01: 15 mg via INTRAVENOUS
  Filled 2011-05-01: qty 1

## 2011-05-01 MED ORDER — INSULIN ASPART 100 UNIT/ML ~~LOC~~ SOLN
0.0000 [IU] | SUBCUTANEOUS | Status: DC
  Filled 2011-05-01: qty 3

## 2011-05-01 MED ORDER — OMEGA-3 FATTY ACIDS 1000 MG PO CAPS: 1.0000 g | ORAL_CAPSULE | Freq: Two times a day (BID) | ORAL | Status: DC

## 2011-05-01 MED FILL — Potassium Chloride Inj 2 mEq/ML: INTRAVENOUS | Qty: 40 | Status: AC

## 2011-05-01 MED FILL — Dexmedetomidine HCl IV Soln 200 MCG/2ML: INTRAVENOUS | Qty: 2 | Status: AC

## 2011-05-01 MED FILL — Lactated Ringer's Solution: INTRAVENOUS | Qty: 500 | Status: AC

## 2011-05-01 MED FILL — Nitroglycerin IV Soln 5 MG/ML: INTRAVENOUS | Qty: 10 | Status: AC

## 2011-05-01 MED FILL — Verapamil HCl IV Soln 2.5 MG/ML: INTRAVENOUS | Qty: 4 | Status: AC

## 2011-05-01 MED FILL — Magnesium Sulfate Inj 50%: INTRAMUSCULAR | Qty: 10 | Status: AC

## 2011-05-01 MED FILL — Heparin Sodium (Porcine) Inj 1000 Unit/ML: INTRAMUSCULAR | Qty: 10 | Status: AC

## 2011-05-01 NOTE — Progress Notes (Signed)
1 Day Post-Op Procedure(s) (LRB): CORONARY ARTERY BYPASS GRAFTING (CABG) (N/A) RADIAL ARTERY HARVEST (Left) Subjective:  C/o right shoulder feeling stiff  Objective: Vital signs in last 24 hours: Temp:  [97.3 F (36.3 C)-100.4 F (38 C)] 98.6 F (37 C) (03/01 0734) Pulse Rate:  [87-88] 87  (03/01 0700) Cardiac Rhythm:  [-] Atrial paced (03/01 0100) Resp:  [12-27] 15  (03/01 0700) BP: (81-119)/(36-78) 103/66 mmHg (03/01 0700) SpO2:  [94 %-100 %] 97 % (03/01 0700) Arterial Line BP: (93-185)/(48-82) 112/55 mmHg (03/01 0700) FiO2 (%):  [40 %-100 %] 40.2 % (02/28 1800) Weight:  [267 lb 13.7 oz (121.5 kg)] 267 lb 13.7 oz (121.5 kg) (03/01 0600)  Hemodynamic parameters for last 24 hours: PAP: (27-42)/(14-25) 30/15 mmHg CO:  [5.4 L/min-8.2 L/min] 8.2 L/min CI:  [2.4 L/min/m2-4.6 L/min/m2] 3.6 L/min/m2  Intake/Output from previous day: 02/28 0701 - 03/01 0700 In: 6332.4 [P.O.:240; I.V.:4218.4; XBJYN:8295; NG/GT:30; IV Piggyback:680] Out: 4200 [Urine:2350; Emesis/NG output:100; Blood:1300; Chest Tube:450] Intake/Output this shift:    General appearance: alert and no distress Neurologic: intact Heart: regular rate and rhythm Lungs: diminished breath sounds bibasilar Abdomen: normal findings: soft, non-tender  Lab Results:   Basename 05/01/11 0355 04/30/11 1945  WBC 10.4 12.1*  HGB 12.3* 13.1  HCT 34.6* 36.9*  PLT 122* 141*   BMET:  Basename 05/01/11 0355 04/30/11 1945 04/30/11 1943 04/29/11 0400  NA 138 -- 140 --  K 4.6 -- 4.1 --  CL 107 -- 107 --  CO2 24 -- -- 24  GLUCOSE 94 -- 116* --  BUN 24* -- 20 --  CREATININE 1.22 1.13 -- --  CALCIUM 8.2* -- -- 9.6    PT/INR:  Basename 04/30/11 1351  LABPROT 16.2*  INR 1.27   ABG    Component Value Date/Time   PHART 7.402 04/30/2011 1947   HCO3 21.2 04/30/2011 1947   TCO2 22 04/30/2011 1947   ACIDBASEDEF 3.0* 04/30/2011 1947   O2SAT 92.0 04/30/2011 1947   CBG (last 3)   Basename 05/01/11 0544 05/01/11 0356 05/01/11  0201  GLUCAP 85 90 80    Assessment/Plan: S/P Procedure(s) (LRB): CORONARY ARTERY BYPASS GRAFTING (CABG) (N/A) RADIAL ARTERY HARVEST (Left) POD # 1 doing well CV- good index, d/c swan, start imdur for radial graft RESP- bibasilar atelectasis- IS RENAL- lytes, cr OK, diurese for volume overload ENDO- CBGs well controlled D/C CT Transfer to 2000   LOS: 4 days    Correne Lalani C 05/01/2011

## 2011-05-01 NOTE — Op Note (Signed)
Mitchell Lambert, Mitchell Lambert NO.:  [id number]  MEDICAL RECORD NO.:  [id number]  LOCATION:  2316                         FACILITY:  [facility]  PHYSICIAN:  Mitchell Lambert, M.D.DATE OF BIRTH:  01-30-1951  DATE OF PROCEDURE:  04/30/2011 DATE OF DISCHARGE:                              OPERATIVE REPORT   PREOPERATIVE DIAGNOSIS:  Severe three-vessel coronary artery disease with unstable angina.  POSTOPERATIVE DIAGNOSIS:  Severe three-vessel coronary artery disease with unstable angina.  PROCEDURE:  Median sternotomy, extracorporeal circulation, coronary artery bypass grafting x4 (left internal mammary artery to left anterior descending, left radial artery to obtuse marginal 1, saphenous vein graft to first diagonal, saphenous vein graft to posterior descending), open left radial harvest, endoscopic harvest of vein from right thigh.  SURGEON:  Mitchell Decent. Dorris Fetch, MD.  ASSISTANT:  Rowe Clack, PA-C  SECOND ASSISTANT:  Al Corpus.  ANESTHESIA:  General.  FINDINGS:  Good quality targets, good quality conduits, low oxygen saturations requiring increasing PEEP post bypass with no other hemodynamic issues.  CLINICAL NOTE:  Mitchell Lambert is a 61 year old gentleman who presented with unstable coronary syndrome.  He ruled out for myocardial infarction.  On cardiac catheterization, he had severe three-vessel coronary artery disease and was referred for coronary artery bypass grafting.  The indications, risks, benefits, and alternatives were discussed in detail with the patient.  He understood and accepted the risks and agreed to proceed.  We did discuss the potential use of the left radial artery and the patient had a normal Allen test on the left with rapid refill.  This was confirmed with pulse ox plethysmography.  The patient was agreeable to a radial artery harvest.  OPERATIVE NOTE:  Mitchell Lambert was brought to the operating room on April 30, 2011.  In the  preoperative holding area, anesthesia placed, a Swan-Ganz catheter, and an arterial blood pressure monitoring line. Intravenous antibiotics were administered.  He was then taken to the operating room, anesthetized, and intubated.  A Foley catheter was placed.  The chest, abdomen, legs and left arm were prepped and draped in usual sterile fashion.  An incision was made over the volar aspect of the left wrist overlying the radial pulse.  Initially, a short distal incision was performed.  This was carried through the skin and subcutaneous tissue.  The fascia was incised.  Short segment of the radial artery was mobilized.  There was an excellent distal pulse and Doppler signal with proximal compression.  Incision then was extended to below the antecubital fossa, and the radial artery was harvested using standard technique with a Harmonic Scalpel.  Simultaneously with this, an incision was made in the medial aspect of the right leg at the level of the knee, and the greater saphenous vein was harvested from the right thigh, and also a median sternotomy was performed, and the left internal mammary artery was harvested using standard technique.  All of the conduit vessels were good quality.  5000 units of heparin was administered during the vessel harvest.  Remainder of full heparin dose was given after opening the pericardium.  After the conduits had been harvested and prepared, the pericardium was opened.  Adequate anticoagulation was  confirmed with ACT measurement. The aorta was then cannulated via concentric 2-0 Ethibond pledgeted pursestring sutures.  A dual-stage venous cannula was placed via pursestring suture in the right atrial appendage.  Cardiopulmonary bypass was instituted, and the patient was cooled to 32 degrees Celsius. The coronary arteries were inspected and anastomotic sites were chosen. The conduits were inspected and cut to length.  A foam pad was placed in the pericardium  to insulate the heart and protect the left phrenic nerve.  A temperature probe was placed in myocardial septum and a cardioplegia cannula was placed in the ascending aorta.  The aorta was crossclamped.  The left ventricle was emptied via the aortic root vent.  Cardiac arrest then was achieved with combination of cold antegrade blood cardioplegia and topical iced saline, 750 mL of cardioplegia was administered.  There was a rapid diastolic arrest with myocardial septal cooling to 9 degrees Celsius.  The following distal anastomoses were performed:  First, a reverse saphenous vein graft was placed end-to-side to the posterior descending branch of the right coronary.  The right coronary had a 95% mid stenosis.  There was some plaquing in the distal right coronary itself, so the graft was placed on the proximal PDA right at its origin where it was a normal vessel.  A 1.5-mm probe did pass easily back retrograde into the right coronary as well as distally to the posterolateral branches.  The vein was anastomosed end-to-side with a running 7-0 Prolene suture.  At completion of the anastomosis, cardioplegia was administered.  There was good flow and good hemostasis.  Next, a reverse saphenous vein graft was placed end-to-side to the first diagonal branch of the LAD.  This was a 1.5-mm good quality target.  It was heavily diseased proximally and free of disease distally.  The vein graft was anastomosed end-to-side with a running 7-0 Prolene suture. Again, there was excellent flow and good hemostasis.  Next, the distal end of the radial artery was beveled and was anastomosed end-to-side to the obtuse marginal 1.  Obtuse marginal 1 was a 1.5-mm good quality target.  The radial was a good quality conduit. The anastomosis was performed end-to-side with a running 8-0 Prolene suture.  At the completion of the anastomosis, it was probed proximally and distally to ensure patency.  Cardioplegia was  administered down the vein grafts and aortic root.  There was good backbleeding from the radial artery.  Next, the left internal mammary artery was brought through a window in the pericardium.  The distal end was beveled.  It was then anastomosed end-to-side to the distal LAD.  The LAD was a 1.5-mm good quality target.  The mammary was a 2-mm good quality conduit.  The anastomosis was performed end-to-side with a running 8-0 Prolene suture.  At the completion of the mammary to LAD anastomosis, the bulldog clamps were briefly removed to inspect for hemostasis.  Immediate rapid septal rewarming was noted.  The bulldog clamp was replaced and the mammary pedicle was tacked to the epicardial surface of the heart with 6-0 Prolene sutures.  Additional cardioplegia was administered.  The vein grafts were cut to length.  Proximal vein graft anastomoses then were performed to 4.5-mm punch aortotomies with running 6-0 Prolene sutures.  A venotomy was made in the diagonal vein graft near its origin from the aorta, and the proximal radial anastomosis was performed end-to-side to this venotomy with a running 7-0 Prolene suture.  At the completion of the final proximal anastomosis, the patient  was placed in Trendelenburg position. Lidocaine was administered.  The aortic root was de-aired, and the aortic crossclamp was removed.  The total crossclamp time was 80 minutes.  The patient did not require defibrillation.  Rewarming was begun.  All proximal and distal anastomoses were inspected for hemostasis.  Epicardial pacing wires were placed on the right ventricle and right atrium.  When the patient had rewarmed to a core temperature of 37 degrees Celsius, he was weaned from cardiopulmonary bypass on the first attempt.  Total bypass time was 107 minutes.  The patient weaned from bypass without difficulty.  He was DDD paced at 90 beats per minute.  He did not require inotropic support.  Initial cardiac  index was greater than 2 L/min/m2.  The patient remained hemodynamically stable throughout the postbypass period.  A test dose of protamine was administered and was well tolerated.  The atrial and aortic cannulae were removed.  The remainder of the protamine was administered without incident.  The chest was irrigated with warm saline.  Hemostasis was achieved.  A left pleural and single mediastinal chest tubes were placed through separate subcostal incisions.  The pericardium could not be reapproximated.  Just prior to closure of the chest, the patient was noted to have decreased oxygen saturation by pulse oximetry.  Arterial blood gas confirmed that the pO2 was low.  The patient responded to increasing the PEEP to 10.  The lungs were both inflating well.  It is unclear what caused the desaturation but the patient did respond appropriately to treatment.  The sternum was closed with interrupted single and double heavy gauge stainless steel wires.  Pectoralis fascia, subcutaneous tissue, and skin were closed in standard fashion.  All sponge, needle, and instrument counts were correct at the end of the procedure.  The patient was taken from the operating room to the surgical intensive care unit in good condition.     Mitchell Decent Dorris Lambert, M.D.     SCH/MEDQ  D:  04/30/2011  T:  05/01/2011  Job:  [id number]

## 2011-05-01 NOTE — Plan of Care (Signed)
Problem: Phase III Progression Outcomes Goal: Time patient transferred to PCTU/Telemetry POD Outcome: Completed/Met Date Met:  05/01/11 @ 1500

## 2011-05-01 NOTE — Progress Notes (Signed)
Notified Dr. Donata Clay about lack of pain management and TCTS sliding scale. I informed MD that the patient has a history of diabetes, and that his current glucose is 169. I also asked if a sliding scale for every four hours is needed since the current order for CBG checks is every 4 hours. He ordered to reduced CBG checks to ACHS and added the TCTS insulin scale as ACHS.  Also, I informed MD that oxycodone and tylenol is not managing pain. Tramdol was ordered to help control pain. Will continue to monitor patient.

## 2011-05-01 NOTE — Progress Notes (Signed)
UR Completed.  Mitchell Lambert [phone] 05/01/2011  

## 2011-05-02 ENCOUNTER — Inpatient Hospital Stay (HOSPITAL_COMMUNITY): Payer: BC Managed Care – PPO

## 2011-05-02 LAB — GLUCOSE, CAPILLARY
Glucose-Capillary: 179 mg/dL — ABNORMAL HIGH (ref 70–99)
Glucose-Capillary: 177 mg/dL — ABNORMAL HIGH (ref 70–99)
Glucose-Capillary: 194 mg/dL — ABNORMAL HIGH (ref 70–99)
Glucose-Capillary: 188 mg/dL — ABNORMAL HIGH (ref 70–99)

## 2011-05-02 LAB — BASIC METABOLIC PANEL
BUN: 26 mg/dL — ABNORMAL HIGH (ref 6–23)
CO2: 26 mEq/L (ref 19–32)
Calcium: 8.9 mg/dL (ref 8.4–10.5)
Chloride: 101 mEq/L (ref 96–112)
Creatinine, Ser: 1.21 mg/dL (ref 0.50–1.35)
GFR calc Af Amer: 73 mL/min — ABNORMAL LOW (ref >90)
GFR calc non Af Amer: 63 mL/min — ABNORMAL LOW (ref >90)
Glucose, Bld: 192 mg/dL — ABNORMAL HIGH (ref 70–99)
Potassium: 5.8 mEq/L — ABNORMAL HIGH (ref 3.5–5.1)
Sodium: 134 mEq/L — ABNORMAL LOW (ref 135–145)

## 2011-05-02 LAB — CBC
HCT: 37.7 % — ABNORMAL LOW (ref 39.0–52.0)
Hemoglobin: 13 g/dL (ref 13.0–17.0)
MCH: 31.6 pg (ref 26.0–34.0)
MCHC: 34.5 g/dL (ref 30.0–36.0)
MCV: 91.5 fL (ref 78.0–100.0)
Platelets: 124 10*3/uL — ABNORMAL LOW (ref 150–400)
RBC: 4.12 MIL/uL — ABNORMAL LOW (ref 4.22–5.81)
RDW: 13.4 % (ref 11.5–15.5)
WBC: 9.7 10*3/uL (ref 4.0–10.5)

## 2011-05-02 MED ORDER — METOPROLOL TARTRATE 25 MG PO TABS
25.0000 mg | ORAL_TABLET | Freq: Two times a day (BID) | ORAL | Status: DC
  Administered 2011-05-02 – 2011-05-05 (×7): 25 mg via ORAL
  Filled 2011-05-02 (×8): qty 1

## 2011-05-02 MED ORDER — KETOROLAC TROMETHAMINE 15 MG/ML IJ SOLN
15.0000 mg | Freq: Four times a day (QID) | INTRAMUSCULAR | Status: AC
  Administered 2011-05-02 (×3): 15 mg via INTRAVENOUS
  Filled 2011-05-02 (×3): qty 1

## 2011-05-02 NOTE — Progress Notes (Signed)
Still with left shoulder pain, no SOB  Tele - OK  Post CABG  Doing well.   Thank you team.

## 2011-05-02 NOTE — Progress Notes (Signed)
CARDIAC REHAB PHASE I   PRE:  Rate/Rhythm: 82 SR    BP: sitting 120/74    SaO2: 92 1L  MODE:  Ambulation: 260 ft   POST:  Rate/Rhythm: 90 sR    BP: sitting 123/70     SaO2: 93 2L  Pt just received toradol for severe shoulder pain. Pain more controlled but pt groggy. Sts woozy walking. Thick phlegm production upon standing. Used RW, assist x1 with 2L O2. To recliner, first time up today. Also had not been using IS. Instructed on IS, walking x3 and recliner. Wife present. Will f/u. [phone] Harriet Masson CES, ACSM

## 2011-05-02 NOTE — Progress Notes (Addendum)
                    [address]            [place] [address]          [phone]     2 Days Post-Op Procedure(s) (LRB): CORONARY ARTERY BYPASS GRAFTING (CABG) (N/A) RADIAL ARTERY HARVEST (Left)  Subjective: Had some nausea overnight, but feels a little better this am.  Very sore.   Objective: Vital signs in last 24 hours: Patient Vitals for the past 24 hrs:  BP Temp Temp src Pulse Resp SpO2 Weight  05/02/11 0441 125/83 mmHg 98.5 F (36.9 C) Oral 85  20  91 % 118.6 kg (261 lb 7.5 oz)  05/01/11 2237 131/62 mmHg - - 90  - - -  05/01/11 2021 119/69 mmHg 98.3 F (36.8 C) Oral 86  18  95 % -  05/01/11 1533 159/78 mmHg 98.7 F (37.1 C) Oral 86  16  93 % -  05/01/11 1500 134/68 mmHg - - 89  17  95 % -  05/01/11 1436 145/80 mmHg - - 85  - - -  05/01/11 1400 133/65 mmHg - - 85  18  96 % -  05/01/11 1300 146/75 mmHg - - 88  18  94 % -  05/01/11 1236 - 98.7 F (37.1 C) Oral - - - -  05/01/11 1200 133/74 mmHg - - 85  19  96 % -  05/01/11 1100 133/77 mmHg - - 78  17  95 % -  05/01/11 1000 - 99.1 F (37.3 C) - 77  16  98 % -   Current Weight  05/02/11 118.6 kg (261 lb 7.5 oz)  PRE-OPERATIVE WEIGHT: 115kg    Intake/Output from previous day: 03/01 0701 - 03/02 0700 In: 243 [P.O.:240; I.V.:3] Out: 1885 [Urine:1825; Chest Tube:60]  CBGs [phone]  PHYSICAL EXAM:  Heart: RRR Lungs: few basilar crackles Wound: clean and dry Extremities: mild LE edema, left hand/arm warm, well perfused, motor/sensory intact  Lab Results: CBC: Basename 05/02/11 0500 05/01/11 0355  WBC 9.7 10.4  HGB 13.0 12.3*  HCT 37.7* 34.6*  PLT 124* 122*   BMET:  Basename 05/02/11 0500 05/01/11 0355  NA 134* 138  K 5.8* 4.6  CL 101 107  CO2 26 24  GLUCOSE 192* 94  BUN 26* 24*  CREATININE 1.21 1.22  CALCIUM 8.9 8.2*    PT/INR:  Basename 04/30/11 1351  LABPROT 16.2*  INR 1.27   CXR: stable atx, no ptx  Assessment/Plan: S/P Procedure(s) (LRB): CORONARY ARTERY BYPASS  GRAFTING (CABG) (N/A) RADIAL ARTERY HARVEST (Left) CV-SR.  BPs trending up.  Will increase beta blocker.  On Cozaar at home- may need to resume. Imdur for RA graft. Vol overload- diurese. DM- continue Lantus. Increase as po intake improves. Hyoerkalemia- hold K+ today and watch labs Pain control- Cr stable, will give a few doses of Toradol.   LOS: 5 days    COLLINS,GINA H 05/02/2011   patient examined and medical record reviewed,agree with above note. VAN TRIGT III,Andriel Omalley 05/02/2011

## 2011-05-03 LAB — BASIC METABOLIC PANEL
BUN: 35 mg/dL — ABNORMAL HIGH (ref 6–23)
CO2: 29 mEq/L (ref 19–32)
Calcium: 9.2 mg/dL (ref 8.4–10.5)
Chloride: 100 mEq/L (ref 96–112)
Creatinine, Ser: 1.39 mg/dL — ABNORMAL HIGH (ref 0.50–1.35)
GFR calc Af Amer: 62 mL/min — ABNORMAL LOW (ref >90)
GFR calc non Af Amer: 53 mL/min — ABNORMAL LOW (ref >90)
Glucose, Bld: 194 mg/dL — ABNORMAL HIGH (ref 70–99)
Potassium: 4.8 mEq/L (ref 3.5–5.1)
Sodium: 137 mEq/L (ref 135–145)

## 2011-05-03 LAB — GLUCOSE, CAPILLARY
Glucose-Capillary: 210 mg/dL — ABNORMAL HIGH (ref 70–99)
Glucose-Capillary: 201 mg/dL — ABNORMAL HIGH (ref 70–99)
Glucose-Capillary: 173 mg/dL — ABNORMAL HIGH (ref 70–99)

## 2011-05-03 MED ORDER — POVIDONE-IODINE 10 % EX SOLN
Freq: Two times a day (BID) | CUTANEOUS | Status: DC
  Administered 2011-05-03 (×3): via TOPICAL
  Administered 2011-05-04: 1 via TOPICAL
  Administered 2011-05-04 – 2011-05-05 (×2): via TOPICAL
  Filled 2011-05-03: qty 118

## 2011-05-03 MED ORDER — INSULIN GLARGINE 100 UNIT/ML ~~LOC~~ SOLN
60.0000 [IU] | Freq: Every day | SUBCUTANEOUS | Status: DC
  Administered 2011-05-03: 60 [IU] via SUBCUTANEOUS

## 2011-05-03 MED ORDER — LOSARTAN POTASSIUM 50 MG PO TABS
50.0000 mg | ORAL_TABLET | Freq: Every day | ORAL | Status: DC
  Administered 2011-05-03 – 2011-05-05 (×3): 50 mg via ORAL
  Filled 2011-05-03 (×3): qty 1

## 2011-05-03 NOTE — Progress Notes (Addendum)
                    [address]            [place] [address]          [phone]     3 Days Post-Op Procedure(s) (LRB): CORONARY ARTERY BYPASS GRAFTING (CABG) (N/A) RADIAL ARTERY HARVEST (Left)  Subjective: Feeling better today, no nausea.  Eating better.    Objective: Vital signs in last 24 hours: Patient Vitals for the past 24 hrs:  BP Temp Temp src Pulse Resp SpO2 Weight  05/03/11 0820 129/70 mmHg - - 86  18  94 % -  05/03/11 0454 126/80 mmHg 98 F (36.7 C) Oral 69  18  90 % 116.574 kg (257 lb)  05/02/11 2018 129/79 mmHg 97.9 F (36.6 C) Oral 75  18  94 % -  05/02/11 1324 120/74 mmHg 98.7 F (37.1 C) - 81  18  92 % -  05/02/11 1140 152/78 mmHg - - 81  - - -   Current Weight  05/03/11 116.574 kg (257 lb)  PRE-OPERATIVE WEIGHT: 115kg    Intake/Output from previous day: 03/02 0701 - 03/03 0700 In: 480 [P.O.:480] Out: 1750 [Urine:1750]  CBGs [phone]  PHYSICAL EXAM:  Heart: RRR Lungs: slightly decreased BS in bases Wound: clean and dry Extremities: mild LE edema, L hand/arm well perfused  Lab Results: CBC: Basename 05/02/11 0500 05/01/11 0355  WBC 9.7 10.4  HGB 13.0 12.3*  HCT 37.7* 34.6*  PLT 124* 122*   BMET:  Basename 05/03/11 0546 05/02/11 0500  NA 137 134*  K 4.8 5.8*  CL 100 101  CO2 29 26  GLUCOSE 194* 192*  BUN 35* 26*  CREATININE 1.39* 1.21  CALCIUM 9.2 8.9    PT/INR:  Basename 04/30/11 1351  LABPROT 16.2*  INR 1.27     Assessment/Plan: S/P Procedure(s) (LRB): CORONARY ARTERY BYPASS GRAFTING (CABG) (N/A) RADIAL ARTERY HARVEST (Left) CV- BPs still a little elevated.  Will start low dose Cozaar (home med) and continue BB.  Imdur for RA graft. DM- po intake improved, will titrate Lantus. Vol overload- diurese. CRPI, wean O2. Cr up slightly today. Will monitor.    LOS: 6 days    COLLINS,GINA H 05/03/2011   patient examined and medical record reviewed,agree with above note. VAN TRIGT  III,Quandra Fedorchak 05/03/2011

## 2011-05-03 NOTE — Progress Notes (Signed)
(  Dr. Donnie Aho - Cards)  Right shoulder pain mildly improved. Toradol.  NSR on tele.   Doing well post op.  Will follow.

## 2011-05-03 NOTE — Progress Notes (Signed)
Pt ambulated 300 ft with standby assist and rolling walker on room air. Pt's O2 sats were 92 % on room air. Pt used IS X 10 . Will continue to monitor. Martyn Malay, RN

## 2011-05-04 ENCOUNTER — Inpatient Hospital Stay (HOSPITAL_COMMUNITY): Payer: BC Managed Care – PPO

## 2011-05-04 ENCOUNTER — Encounter (HOSPITAL_COMMUNITY): Payer: Self-pay | Admitting: Thoracic Surgery (Cardiothoracic Vascular Surgery)

## 2011-05-04 LAB — BASIC METABOLIC PANEL
BUN: 32 mg/dL — ABNORMAL HIGH (ref 6–23)
CO2: 29 mEq/L (ref 19–32)
Calcium: 9.2 mg/dL (ref 8.4–10.5)
Chloride: 101 mEq/L (ref 96–112)
Creatinine, Ser: 1.39 mg/dL — ABNORMAL HIGH (ref 0.50–1.35)
GFR calc Af Amer: 62 mL/min — ABNORMAL LOW (ref >90)
GFR calc non Af Amer: 53 mL/min — ABNORMAL LOW (ref >90)
Glucose, Bld: 172 mg/dL — ABNORMAL HIGH (ref 70–99)
Potassium: 4 mEq/L (ref 3.5–5.1)
Sodium: 138 mEq/L (ref 135–145)

## 2011-05-04 LAB — GLUCOSE, CAPILLARY
Glucose-Capillary: 159 mg/dL — ABNORMAL HIGH (ref 70–99)
Glucose-Capillary: 177 mg/dL — ABNORMAL HIGH (ref 70–99)
Glucose-Capillary: 267 mg/dL — ABNORMAL HIGH (ref 70–99)
Glucose-Capillary: 170 mg/dL — ABNORMAL HIGH (ref 70–99)
Glucose-Capillary: 153 mg/dL — ABNORMAL HIGH (ref 70–99)

## 2011-05-04 MED ORDER — BISACODYL 10 MG RE SUPP
10.0000 mg | Freq: Every day | RECTAL | Status: AC
  Administered 2011-05-04: 10 mg via RECTAL
  Filled 2011-05-04: qty 1

## 2011-05-04 MED ORDER — LOSARTAN POTASSIUM 100 MG PO TABS: 50.0000 mg | ORAL_TABLET | Freq: Every day | ORAL | Status: DC

## 2011-05-04 MED ORDER — ISOSORBIDE MONONITRATE ER 30 MG PO TB24: 30.0000 mg | ORAL_TABLET | Freq: Every day | ORAL | Status: DC

## 2011-05-04 MED ORDER — OMEGA-3-ACID ETHYL ESTERS 1 G PO CAPS: 1.0000 g | ORAL_CAPSULE | Freq: Two times a day (BID) | ORAL | Status: DC

## 2011-05-04 MED ORDER — FUROSEMIDE 40 MG PO TABS: 40.0000 mg | ORAL_TABLET | Freq: Every day | ORAL | Status: DC

## 2011-05-04 MED ORDER — METOPROLOL TARTRATE 25 MG PO TABS: 25.0000 mg | ORAL_TABLET | Freq: Two times a day (BID) | ORAL | Status: DC

## 2011-05-04 MED ORDER — INSULIN ASPART 100 UNIT/ML ~~LOC~~ SOLN
6.0000 [IU] | Freq: Three times a day (TID) | SUBCUTANEOUS | Status: DC
  Administered 2011-05-04 – 2011-05-05 (×3): 6 [IU] via SUBCUTANEOUS

## 2011-05-04 MED ORDER — OXYCODONE HCL 5 MG PO TABS
10.0000 mg | ORAL_TABLET | ORAL | Status: DC | PRN
  Administered 2011-05-04 – 2011-05-05 (×3): 10 mg via ORAL
  Filled 2011-05-04 (×2): qty 2

## 2011-05-04 MED ORDER — OXYCODONE HCL 5 MG PO TABS
10.0000 mg | ORAL_TABLET | ORAL | Status: DC | PRN
  Administered 2011-05-04: 10 mg via ORAL
  Filled 2011-05-04: qty 2

## 2011-05-04 MED ORDER — OXYCODONE HCL 15 MG PO TABS: 15.0000 mg | ORAL_TABLET | ORAL | Status: AC | PRN

## 2011-05-04 MED ORDER — INSULIN GLARGINE 100 UNIT/ML ~~LOC~~ SOLN: 80.0000 [IU] | Freq: Every day | SUBCUTANEOUS | Status: DC

## 2011-05-04 MED ORDER — OXYCODONE HCL 5 MG PO TABS
15.0000 mg | ORAL_TABLET | ORAL | Status: DC | PRN
  Administered 2011-05-04 – 2011-05-05 (×2): 15 mg via ORAL
  Filled 2011-05-04: qty 2
  Filled 2011-05-04 (×2): qty 3

## 2011-05-04 MED ORDER — ASPIRIN 325 MG PO TBEC: 325.0000 mg | DELAYED_RELEASE_TABLET | Freq: Every day | ORAL | Status: DC

## 2011-05-04 MED ORDER — INSULIN GLARGINE 100 UNIT/ML ~~LOC~~ SOLN
80.0000 [IU] | Freq: Every day | SUBCUTANEOUS | Status: DC
  Administered 2011-05-04: 80 [IU] via SUBCUTANEOUS

## 2011-05-04 NOTE — Discharge Instructions (Signed)
Endoscopic Saphenous Vein Harvesting A procedure called saphenous vein harvesting is done as part of some heart surgeries. Blood vessels that carry blood to the heart can become blocked. When that happens, a surgeon can create a detour (bypass) around the blocked area. To do this, the surgeon uses a healthy blood vessel from someplace else in your body. The vein that runs along the inside of your leg (greater saphenous vein) is often used. It goes from your ankle to your groin. Saphenous vein harvesting is the procedure that is done to take part of this vein from your leg. For an endoscopic procedure, only very small cuts (incisions) are needed. The surgeon uses a tiny tool (endoscope) to find and take out part of the vein.  LET YOUR CAREGIVER KNOW ABOUT:  Allergies to food or medicine.   Medicines taken, including vitamins, herbs, eyedrops, over-the-counter medicines, and creams.   Use of steroids (by mouth or creams).   Previous problems with anesthetics or numbing medicines.   History of bleeding problems or blood clots.   Previous surgery.   Problems with your leg veins, such as painful or enlarged (varicose) veins.   Other health problems, including diabetes, history of wound problems, and kidney problems.   Possibility of pregnancy, if this applies.  RISKS AND COMPLICATIONS Saphenous vein harvesting is done as a part of heart surgery.The heart surgery may have risks. However, there are usually few problems with the vein harvesting part of surgery. This is especially true with an endoscopic procedure. However, problems can occur, such as:  Bleeding in the leg.   Bleeding under the skin.   Infection.   Leg pain.   Leg swelling.   Nerve damage.  Some people are more likely than others to have problems with saphenous vein harvesting. They include:  Women.   People with diabetes.   People who are overweight.   People who smoke.   People who have had a blood vessel  disease in their legs.   People who have varicose leg veins.  BEFORE THE PROCEDURE  A medical evaluation will be done. This may include an ultrasound exam to make sure your saphenous vein is healthy.   If you smoke, quit a few weeks before your surgery.   A week before the procedure, stop taking drugs that can cause bleeding during and after your surgery. This includes aspirin, nonsteroidal anti-inflammatory drugs, ibuprofen, and naproxen. Also stop taking vitamin E.   If you take blood thinners, ask your surgeon when you should stop taking them before surgery.   The day before the surgery, eat only a light dinner. Do not eat or drink anything after midnight. Ask your caregiver if it is okay to take any needed medicines with a sip of water.   Arrive at least 1 hour before the surgery, or as directed by your surgeon.  PROCEDURE Saphenous vein harvesting will be done at the very beginning of your heart surgery. It is usually done while your heart surgery is being started.  Small monitors will be put on your body. They are used to check your heart, blood pressure, and oxygen level.   You will be given an intravenous line (IV). A needle will be put in your arm or hand. It is hooked to a plastic tube. Medicine will flow directly into your body through the IV.   You will be given medicine that makes you sleep (general anesthetic).   A tube will be put in your throat to help  you breathe during the surgery. It will also be used to give you anesthetic medicine during the surgery.   A soft tube (catheter) may be put in your bladder to drain urine during and after the surgery.   Your leg will be cleaned with a germ-killing (antiseptic) solution.   When you are asleep, 1 to 3 incisions will be made on the inside of your leg. The endoscope will be put into these incisions. The endoscope is a long, thin tube with a tiny camera on the end. The surgeon will use this tool to find a segment of vein that  will work for the bypass. This segment is cut out.   Clips, ties, or electric current (cautery) may be used to seal off the vein. These methods stop the bleeding. Other veins will take over for the one that is removed to keep your leg healthy.   The surgeon will close the incisions. Clips or small stitches (sutures) may be used.   A drain may be placed under the skin of your leg. It looks like a long, thin tube, and it allows fluids to drain out of your leg after the surgery.   Your leg will probably be wrapped in an elastic bandage or stocking.   The vein that was harvested will be used in your heart surgery.  AFTER THE PROCEDURE When your heart surgery is over, you will probably be taken directly to the intensive caCoronary Artery Bypass Grafting Care After Refer to this sheet in the next few weeks. These instructions provide you with information on caring for yourself after your procedure. Your caregiver may also give you more specific instructions. Your treatment has been planned according to current medical practices, but problems sometimes occur. Call your caregiver if you have any problems or questions after your procedure.  Recovery from open heart surgery will be different for everyone. Some people feel well after 3 or 4 weeks, while for others it takes longer. After heart surgery, it may be normal to: Not have an appetite, feel nauseated by the smell of food, or only want to eat a small amount.  Be constipated because of changes in your diet, activity, and medicines. Eat foods high in fiber. Add fresh fruits and vegetables to your diet. Stool softeners may be helpful.  Feel sad or unhappy. You may be frustrated or cranky. You may have good days and bad days. Do not give up. Talk to your caregiver if you do not feel better.  Feel weakness and fatigue. You many need physical therapy or cardiac rehabilitation to get your strength back.  Develop an irregular heartbeat called atrial  fibrillation. Symptoms of atrial fibrillation are a fast, irregular heartbeat or feelings of fluttery heartbeats, shortness of breath, low blood pressure, and dizziness. If these symptoms develop, see your caregiver right away.  MEDICATION Have a list of all the medicines you will be taking when you leave the hospital. For every medicine, know the following:  Name.  Exact dose.  Time of day to be taken.  How often it should be taken.  Why you are taking it.  Ask which medicines should or should not be taken together. If you take more than one heart medicine, ask if it is okay to take them together. Some heart medicines should not be taken at the same time because they may lower your blood pressure too much.  Narcotic pain medicine can cause constipation. Eat fresh fruits and vegetables. Add fiber to your diet.  Stool softener medicine may help relieve constipation.  Keep a copy of your medicines with you at all times.  Do not add or stop taking any medicine until you check with your caregiver.  Medicines can have side effects. Call your caregiver who prescribed the medicine if you:  Start throwing up, have diarrhea, or have stomach pain.  Feel dizzy or lightheaded when you stand up.  Feel your heart is skipping beats or is beating too fast or too slow.  Develop a rash.  Notice unusual bruising or bleeding.  HOME CARE INSTRUCTIONS After heart surgery, it is important to learn how to take your pulse. Have your caregiver show you how to take your pulse.  Use your incentive spirometer. Ask your caregiver how long after surgery you need to use it.  Care of your chest incision Tell your caregiver right away if you notice clicking in your chest (sternum).  Support your chest with a pillow or your arms when you take deep breaths and cough.  Follow your caregiver's instructions about when you can bathe or swim.  Protect your incision from sunlight during the first year to keep the scar from getting  dark.  Tell your caregiver if you notice:  Increased tenderness of your incision.  Increased redness or swelling around your incision.  Drainage or pus from your incision.  Care of your leg incision(s) Avoid crossing your legs.  Avoid sitting for long periods of time. Change positions every half hour.  Elevate your leg(s) when you are sitting.  Check your leg(s) daily for swelling. Check the incisions for redness or drainage.  Wear your elastic stockings as told by your caregiver. Take them off at bedtime.  Diet Diet is very important to heart health.  Eat plenty of fresh fruits and vegetables. Meats should be lean cut. Avoid canned, processed, and fried foods.  Talk to a dietician. They can teach you how to make healthy food and drink choices.  Weight Weigh yourself every day. This is important because it helps to know if you are retaining fluid that may make your heart and lungs work harder.  Use the same scale each time.  Weigh yourself every morning at the same time. You should do this after you go to the bathroom, but before you eat breakfast.  Your weight will be more accurate if you do not wear any clothes.  Record your weight.  Tell your caregiver if you have gained 2 pounds or more overnight.  Activity Stop any activity at once if you have chest pain, shortness of breath, irregular heartbeats, or dizziness. Get help right away if you have any of these symptoms. Bathing.  Avoid soaking in a bath or hot tub until your incisions are healed.  Rest. You need a balance of rest and activity.  Exercise. Exercise per your caregiver's advice. You may need physical therapy or cardiac rehabilitation to help strengthen your muscles and build your endurance.  Climbing stairs. Unless your caregiver tells you not to climb stairs, go up stairs slowly and rest if you tire. Do not pull yourself up by the handrail.  Driving a car. Follow your caregiver's advice on when you may drive. You may ride as  a passenger at any time. When traveling for long periods of time in a car, get out of the car and walk around for a few minutes every 2 hours.  Lifting. Avoid lifting, pushing, or pulling anything heavier than 10 pounds for 6 weeks after surgery or  as told by your caregiver.  Returning to work. Check with your caregiver. People heal at different rates. Most people will be able to go back to work 6 to 12 weeks after surgery.  Sexual activity. You may resume sexual relations as told by your caregiver.  SEEK MEDICAL CARE IF: Any of your incisions are red, painful, or have any type of drainage coming from them.  You have an oral temperature above 102 F (38.9 C).  You have ankle or leg swelling.  You have pain in your legs.  You have weight gain of 2 or more pounds a day.  You feel dizzy or lightheaded when you stand up.  SEEK IMMEDIATE MEDICAL CARE IF: You have angina or chest pain that goes to your jaw or arms. Call your local emergency services right away.  You have shortness of breath at rest or with activity.  You have a fast or irregular heartbeat (arrhythmia).  There is a "clicking" in your sternum when you move.  You have numbness or weakness in your arms or legs.  MAKE SURE YOU: Understand these instructions.  Will watch your condition.  Will get help right away if you are not doing well or get worse.  Document Released: 09/05/2004 Document Revised: 02/05/2011 Document Reviewed: 04/23/2010  [facility] Patient Information 2012 [place], [place].re unit (ICU). You will continue to use a breathing machine (ventilator) and get fluids through the IV for a while.   When no more fluid drains from your leg, the drain in your leg will be taken out.   You will be able to go home once you are recovered from your heart surgery. Most people stay in the hospital for at least 4 days. You may spend 1 to several days in an intensive care area. Then you may spend several days in a regular hospital room.    Document Released: 10/29/2010 Document Revised: 02/05/2011 Document Reviewed: 10/29/2010 [facility] Patient Information 2012 [place], [place].

## 2011-05-04 NOTE — Progress Notes (Signed)
Pt ambulated 550 ft with a walker on room air. Pt tolerated well. No complaints or signs of distress. Pt returned to the bed. Call light within reach. Will continue to monitor

## 2011-05-04 NOTE — Progress Notes (Signed)
Inpatient Diabetes Program Recommendations  AACE/ADA: New Consensus Statement on Inpatient Glycemic Control (2009)  Target Ranges:  Prepandial:   less than 140 mg/dL      Peak postprandial:   less than 180 mg/dL (1-2 hours)      Critically ill patients:  140 - 180 mg/dL   Reason for Visit: Results for Mitchell Lambert, Mitchell Lambert (MRN [id number]) as of 05/04/2011 15:27  Ref. Range 05/03/2011 11:11 05/03/2011 16:33 05/03/2011 20:49 05/04/2011 05:58 05/04/2011 11:35  Glucose-Capillary Latest Range: 70-99 mg/dL 782 (H) 956 (H) 213 (H) 177 (H) 267 (H)    Inpatient Diabetes Program Recommendations HgbA1C: Note A1C elevated= 9.0%. Diet: Please change diet to Carbohydrate modified-medium  Note: Will follow.

## 2011-05-04 NOTE — Progress Notes (Signed)
CARDIAC REHAB PHASE I   PRE:  Rate/Rhythm: 85 SR  BP:  Supine: 116/70  Sitting:   Standing:    SaO2: 91 RA  MODE:  Ambulation: 550 ft   POST:  Rate/Rhythem: 102  BP:  Supine:   Sitting: 114/70  Standing:    SaO2: 88-91 RA 1435-1500  Assisted X 1 to ambulate with hand held assist. Gait steady, walked 550 ft. VS stable. Discussed Outpt. CRP with pt, he agrees to referral to [place]. Back to bed after walk with call light in reach.  Beatrix Fetters

## 2011-05-04 NOTE — Progress Notes (Signed)
   CARE MANAGEMENT NOTE 05/04/2011  Patient:  Mitchell Lambert, Mitchell Lambert   Account Number:  [id number]  Date Initiated:  05/01/2011  Documentation initiated by:  [facility]  Subjective/Objective Assessment:   CABG on 04-30-11 -  Has spouse.     Action/Plan:   PTA, PT INDEPENDENT, LIVES WITH WIFE.   Anticipated DC Date:  05/06/2011   Anticipated DC Plan:  HOME W HOME HEALTH SERVICES      DC Planning Services  CM consult      Choice offered to / List presented to:             Status of service:  In process, will continue to follow Medicare Important Message given?   (If response is "NO", the following Medicare IM given date fields will be blank) Date Medicare IM given:   Date Additional Medicare IM given:    Discharge Disposition:    Per UR Regulation:  Reviewed for med. necessity/level of care/duration of stay  Comments:  05/04/11 Lissy Deuser,RN,BSN MET WITH PT TO DISCUSS DC PLANS.  WIFE TO PROVIDE 24HR CARE AT DC.  WILL FOLLOW FOR HOME NEEDS AS PT PROGRESSES. Phone #[phone]

## 2011-05-04 NOTE — Progress Notes (Signed)
4 Days Post-Op Procedure(s) (LRB): CORONARY ARTERY BYPASS GRAFTING (CABG) (N/A) RADIAL ARTERY HARVEST (Left) Subjective: C/o right shoulder pain and limited ROM Objective: Vital signs in last 24 hours: Temp:  [98.1 F (36.7 C)-98.5 F (36.9 C)] 98.4 F (36.9 C) (03/04 0414) Pulse Rate:  [76-87] 76  (03/04 0414) Cardiac Rhythm:  [-] Normal sinus rhythm (03/03 2247) Resp:  [18-19] 18  (03/04 0414) BP: (117-152)/(71-74) 152/74 mmHg (03/04 0414) SpO2:  [91 %-94 %] 92 % (03/04 0414) Weight:  [253 lb 12.8 oz (115.123 kg)] 253 lb 12.8 oz (115.123 kg) (03/04 0414)  Hemodynamic parameters for last 24 hours:    Intake/Output from previous day: 03/03 0701 - 03/04 0700 In: 720 [P.O.:720] Out: 850 [Urine:850] Intake/Output this shift:    General appearance: alert and mild distress Neurologic: intact Heart: regular rate and rhythm Lungs: clear to auscultation bilaterally Extremities: r shoulder mildly tender to palpation, limited ROM, pain with active and passive movement Wound: clean and dry  Lab Results:  Basename 05/02/11 0500  WBC 9.7  HGB 13.0  HCT 37.7*  PLT 124*   BMET:  Basename 05/04/11 0530 05/03/11 0546  NA 138 137  K 4.0 4.8  CL 101 100  CO2 29 29  GLUCOSE 172* 194*  BUN 32* 35*  CREATININE 1.39* 1.39*  CALCIUM 9.2 9.2    PT/INR: No results found for this basename: LABPROT,INR in the last 72 hours ABG    Component Value Date/Time   PHART 7.402 04/30/2011 1947   HCO3 21.2 04/30/2011 1947   TCO2 22 04/30/2011 1947   ACIDBASEDEF 3.0* 04/30/2011 1947   O2SAT 92.0 04/30/2011 1947   CBG (last 3)   Basename 05/04/11 0558 05/03/11 1633 05/03/11 1111  GLUCAP 177* 173* 201*    Assessment/Plan: S/P Procedure(s) (LRB): CORONARY ARTERY BYPASS GRAFTING (CABG) (N/A) RADIAL ARTERY HARVEST (Left) -CV - stable, Imdur for radial graft RESP- stable RENAL- creatinine stable DM- cbgs elevated, will increase lantus and meal coverage Right shoulder pain/ ROM- will  increase oxycodone, X ray shoulder, PT evaluation   LOS: 7 days    Mitchell Lambert C 05/04/2011

## 2011-05-04 NOTE — Progress Notes (Signed)
UR Completed.  Mitchell Lambert [name] 05/04/2011 [phone]  

## 2011-05-04 NOTE — Evaluation (Signed)
Physical Therapy Evaluation Patient Details Name: Mitchell Lambert MRN: [id number] DOB: [birth year] Today's Date: 05/04/2011  Problem List:  Patient Active Problem List  Diagnoses  . Unstable angina  . Insulin dependent type 2 diabetes mellitus, controlled  . Hyperlipidemia  . Hypertension  . Obesity (BMI 30-39.9)  . Nephrolithiasis, uric acid  . CAD (coronary artery disease)    Past Medical History:  Past Medical History  Diagnosis Date  . Diabetes mellitus   . Hypertension   . Hyperlipemia   . Arthritis   . Gout   . Angina   . Coronary artery disease   . Kidney stone     "close to 100 since age 54"  . Chronic daily headache     "@ least every other day"   Past Surgical History:  Past Surgical History  Procedure Date  . Lithotripsy     "many; probably 5 times"  . Kidney stone surgery     "probably 3 times"  . Cardiac catheterization 04/27/11  . Cataract extraction w/ intraocular lens implant 1990's    right; "lens was replaced twice over 3 months; lens is actually over iris"  . Retinal detachment surgery 1990's    right eye; "made me have an early cataract"  . Bone spur 1990's    right great toe; "probably related to gout"    PT Assessment/Plan/Recommendation PT Assessment Clinical Impression Statement: Pt. is 61 y/o male admitted s/p CABG x 3.  Pt. developed post-op R shoulder pain and demonstrates decreased ROM and strength as a result.  Recommend PROM exercises and referral to OPPT if no resolve of symptoms when pt. appropriately healed after CABG. PT Recommendation/Assessment: All further PT needs can be met in the next venue of care PT Problem List: Decreased strength;Decreased range of motion;Pain PT Therapy Diagnosis : Acute pain;Generalized weakness PT Recommendation Follow Up Recommendations: Outpatient PT (when appropriate due to recent CABG) Equipment Recommended: None recommended by PT PT Goals     PT Evaluation Precautions/Restrictions    Precautions Precautions: Sternal Required Braces or Orthoses: No Restrictions Weight Bearing Restrictions: No Prior Functioning  Home Living Lives With: Spouse Prior Function Level of Independence: Independent with transfers;Independent with gait;Independent with homemaking with ambulation;Independent with basic ADLs Able to Take Stairs?: Yes Driving: Yes Vocation: Full time employment Leisure: Hobbies-yes (Comment) Comments: musician; plays Teaching laboratory technician Arousal/Alertness: Awake/alert Overall Cognitive Status: Appears within functional limits for tasks assessed Orientation Level: Oriented X4 Sensation/Coordination Sensation Light Touch: Not tested Stereognosis: Not tested Hot/Cold: Not tested Proprioception: Not tested Coordination Gross Motor Movements are Fluid and Coordinated: Yes Fine Motor Movements are Fluid and Coordinated: No Extremity Assessment RUE Assessment RUE Assessment: Exceptions to [facility] RUE PROM (degrees) Right Shoulder Flexion  0-170: 90 Degrees Right Shoulder ABduction 0-140: 90 Degrees Right Shoulder External Rotation  0-90: 45 Degrees RUE Strength Right Shoulder Flexion: 1/5 Right Shoulder ABduction: 1/5 LUE Assessment LUE Assessment: Within Functional Limits RLE Assessment RLE Assessment: Within Functional Limits LLE Assessment LLE Assessment: Within Functional Limits Mobility (including Balance) Bed Mobility Bed Mobility: Yes Rolling Right: 4: Min assist Rolling Right Details (indicate cue type and reason): cues for technique without rail and maintain sternal precautions Right Sidelying to Sit: 4: Min assist;HOB flat Right Sidelying to Sit Details (indicate cue type and reason): cues for technique  Transfers Transfers: Yes Sit to Stand: 7: Independent;From bed Stand to Sit: 7: Independent;To bed Ambulation/Gait Ambulation/Gait: No Stairs: No Wheelchair Mobility Wheelchair Mobility: No  Posture/Postural  Control Posture/Postural Control: No  significant limitations Balance Balance Assessed: No Exercise  Shoulder Exercises Pendulum Exercise: Self ROM;PROM;Right;10 reps;Standing Shoulder Flexion: 10 reps;Seated;AAROM;Self ROM (LUE assisting RUE; to 90 degrees only) End of Session PT - End of Session Activity Tolerance: Patient tolerated treatment well Patient left: in bed;with family/visitor present;with call bell in reach General Behavior During Session: [facility] for tasks performed Cognition: [facility] for tasks performed  Feltis, Nicki Reaper 05/04/2011, 3:05 PM  Nicki Reaper. Feltis, PT, DPT [phone]

## 2011-05-05 LAB — CBC
HCT: 37 % — ABNORMAL LOW (ref 39.0–52.0)
Hemoglobin: 12.8 g/dL — ABNORMAL LOW (ref 13.0–17.0)
MCH: 31.4 pg (ref 26.0–34.0)
MCHC: 34.6 g/dL (ref 30.0–36.0)
MCV: 90.9 fL (ref 78.0–100.0)
Platelets: 230 10*3/uL (ref 150–400)
RBC: 4.07 MIL/uL — ABNORMAL LOW (ref 4.22–5.81)
RDW: 13 % (ref 11.5–15.5)
WBC: 6.1 10*3/uL (ref 4.0–10.5)

## 2011-05-05 LAB — GLUCOSE, CAPILLARY
Glucose-Capillary: 222 mg/dL — ABNORMAL HIGH (ref 70–99)
Glucose-Capillary: 272 mg/dL — ABNORMAL HIGH (ref 70–99)

## 2011-05-05 LAB — BASIC METABOLIC PANEL
BUN: 35 mg/dL — ABNORMAL HIGH (ref 6–23)
CO2: 29 mEq/L (ref 19–32)
Calcium: 9.6 mg/dL (ref 8.4–10.5)
Chloride: 100 mEq/L (ref 96–112)
Creatinine, Ser: 1.44 mg/dL — ABNORMAL HIGH (ref 0.50–1.35)
GFR calc Af Amer: 59 mL/min — ABNORMAL LOW (ref >90)
GFR calc non Af Amer: 51 mL/min — ABNORMAL LOW (ref >90)
Glucose, Bld: 236 mg/dL — ABNORMAL HIGH (ref 70–99)
Potassium: 3.8 mEq/L (ref 3.5–5.1)
Sodium: 138 mEq/L (ref 135–145)

## 2011-05-05 MED ORDER — INSULIN GLARGINE 100 UNIT/ML ~~LOC~~ SOLN: 100.0000 [IU] | Freq: Every day | SUBCUTANEOUS | Status: DC

## 2011-05-05 MED ORDER — INSULIN GLARGINE 100 UNIT/ML ~~LOC~~ SOLN
100.0000 [IU] | Freq: Every day | SUBCUTANEOUS | Status: DC
  Administered 2011-05-05: 100 [IU] via SUBCUTANEOUS

## 2011-05-05 MED ORDER — FUROSEMIDE 40 MG PO TABS: 40.0000 mg | ORAL_TABLET | Freq: Every day | ORAL | Status: DC

## 2011-05-05 MED ORDER — INSULIN ASPART 100 UNIT/ML ~~LOC~~ SOLN
8.0000 [IU] | Freq: Three times a day (TID) | SUBCUTANEOUS | Status: DC
  Administered 2011-05-05: 8 [IU] via SUBCUTANEOUS

## 2011-05-05 MED FILL — Sodium Chloride IV Soln 0.9%: INTRAVENOUS | Qty: 1000 | Status: AC

## 2011-05-05 MED FILL — Heparin Sodium (Porcine) Inj 1000 Unit/ML: INTRAMUSCULAR | Qty: 30 | Status: AC

## 2011-05-05 MED FILL — Sodium Chloride Irrigation Soln 0.9%: Qty: 3000 | Status: AC

## 2011-05-05 MED FILL — Electrolyte-R (PH 7.4) Solution: INTRAVENOUS | Qty: 4000 | Status: AC

## 2011-05-05 MED FILL — Lidocaine HCl IV Inj 20 MG/ML: INTRAVENOUS | Qty: 5 | Status: AC

## 2011-05-05 MED FILL — Mannitol IV Soln 20%: INTRAVENOUS | Qty: 500 | Status: AC

## 2011-05-05 MED FILL — Sodium Bicarbonate IV Soln 8.4%: INTRAVENOUS | Qty: 50 | Status: AC

## 2011-05-05 MED FILL — Heparin Sodium (Porcine) Inj 1000 Unit/ML: INTRAMUSCULAR | Qty: 20 | Status: AC

## 2011-05-05 NOTE — Progress Notes (Addendum)
DC'd EPWs and CT sutures per MD order per hospital policy. Pacing wires intact. Applied benzoin and steri strips to CT suture site. Patient tolerated well, advised patient to remain in bed for 1 hour. Will continue to monitor patient closely. Lajuana Matte, RN

## 2011-05-05 NOTE — Progress Notes (Signed)
CARDIAC REHAB PHASE I   PRE:  Rate/Rhythm: 85SR  BP:  Supine: 120/80  Sitting:   Standing:    SaO2: 91%RA  MODE:  Ambulation: 890 ft   POST:  Rate/Rhythem: 95  BP:  Supine:   Sitting: 110/80  Standing:    SaO2: 93%RA [id number] Pt walked 890 ft with steady gait. Tolerated well. To recliner after walk with call bell. Did well on RA. Education completed. Referring to Phase 2.  Duanne Limerick

## 2011-05-05 NOTE — Progress Notes (Addendum)
[address]            [place] [address]          [phone]     5 Days Post-Op  Procedure(s) (LRB): CORONARY ARTERY BYPASS GRAFTING (CABG) (N/A) RADIAL ARTERY HARVEST (Left) Subjective: Shoulder feels better. Mild nausea  Objective  Telemetry NSR  Temp:  [97.6 F (36.4 C)-98.3 F (36.8 C)] 98 F (36.7 C) (03/05 0441) Pulse Rate:  [78-83] 78  (03/05 0441) Resp:  [16-19] 16  (03/05 0441) BP: (127-147)/(69-79) 129/77 mmHg (03/05 0441) SpO2:  [87 %-93 %] 93 % (03/05 0441) Weight:  [246 lb 11.1 oz (111.9 kg)] 246 lb 11.1 oz (111.9 kg) (03/05 0441)   Intake/Output Summary (Last 24 hours) at 05/05/11 0802 Last data filed at 05/04/11 2149  Gross per 24 hour  Intake    246 ml  Output      0 ml  Net    246 ml       General appearance: alert and no distress Heart: regular rate and rhythm and S1, S2 normal Lungs: clear to auscultation bilaterally Abdomen: soft, non-tender; bowel sounds normal; no masses,  no organomegaly Extremities: no edema Wound: incisions healing well, L arm N/V intact  Lab Results:  Basename 05/05/11 0550 05/04/11 0530  NA 138 138  K 3.8 4.0  CL 100 101  CO2 29 29  GLUCOSE 236* 172*  BUN 35* 32*  CREATININE 1.44* 1.39*  CALCIUM 9.6 9.2  MG -- --  PHOS -- --   No results found for this basename: AST:2,ALT:2,ALKPHOS:2,BILITOT:2,PROT:2,ALBUMIN:2 in the last 72 hours No results found for this basename: LIPASE:2,AMYLASE:2 in the last 72 hours  Basename 05/05/11 0550  WBC 6.1  NEUTROABS --  HGB 12.8*  HCT 37.0*  MCV 90.9  PLT 230   No results found for this basename: CKTOTAL:4,CKMB:4,TROPONINI:4 in the last 72 hours No components found with this basename: POCBNP:3 No results found for this basename: DDIMER in the last 72 hours No results found for this basename: HGBA1C in the last 72 hours No results found for this basename: CHOL,HDL,LDLCALC,TRIG,CHOLHDL in the last 72 hours No results found for this  basename: TSH,T4TOTAL,FREET3,T3FREE,THYROIDAB in the last 72 hours No results found for this basename: VITAMINB12,FOLATE,FERRITIN,TIBC,IRON,RETICCTPCT in the last 72 hours  Medications: Scheduled    . acetaminophen  1,000 mg Oral Q6H  . allopurinol  300 mg Oral Daily  . aspirin EC  325 mg Oral Daily  . bisacodyl  10 mg Rectal Daily  . enoxaparin  40 mg Subcutaneous Q24H  . furosemide  40 mg Oral Daily  . insulin aspart  0-24 Units Subcutaneous TID AC & HS  . insulin aspart  6 Units Subcutaneous TID WC  . insulin glargine  80 Units Subcutaneous Daily  . isosorbide mononitrate  30 mg Oral Daily  . losartan  50 mg Oral Daily  . metoprolol tartrate  25 mg Oral BID  . omega-3 acid ethyl esters  1 g Oral BID  . pantoprazole  40 mg Oral Q1200  . povidone-iodine   Topical BID  . simvastatin  40 mg Oral q1800  . sodium chloride  3 mL Intravenous Q12H  . DISCONTD: bisacodyl  10 mg Oral Daily  . DISCONTD: bisacodyl  10 mg Rectal Daily  . DISCONTD: furosemide  40 mg Intravenous Once  . DISCONTD: insulin aspart  4 Units Subcutaneous TID  WC  . DISCONTD: insulin glargine  60 Units Subcutaneous Daily     Radiology/Studies:  Dg Shoulder Right  05/04/2011  *RADIOLOGY REPORT*  Clinical Data: Postop open heart surgery.  Right shoulder pain.  RIGHT SHOULDER - 2+ VIEW  Comparison: None.  Findings: No fracture or dislocation.  There are degenerative changes in the acromioclavicular joint.  Probable small bone island in the humeral head.  Visualized portion of the right chest shows low lung volume and possible tiny right pleural effusion.  IMPRESSION:  1.  No acute findings in the shoulder. 2.  Right acromioclavicular joint osteoarthritis. 3.  Question small right pleural effusion.  Original Report Authenticated By: Reyes Ivan, M.D.    INR: Will add last result for INR, ABG once components are confirmed Will add last 4 CBG results once components are confirmed  Assessment/Plan: S/P  Procedure(s) (LRB): CORONARY ARTERY BYPASS GRAFTING (CABG) (N/A) RADIAL ARTERY HARVEST (Left)   1. D/C epw's 2. Cont rehab 3. Poss home later today VS am  LOS: 8 days    GOLD,WAYNE E 3/5/20138:02 AM   Pt seen and examined. Agree with above.

## 2011-05-05 NOTE — Discharge Summary (Signed)
[address]            [place] [address]          [phone]      Mitchell Lambert 15-Apr-1950 61 y.o. [id number]  04/27/2011   Loreli Slot, MD  Unstable angina [411.1] Chest pain cp CAD   HPI: At time of admission This 61 year old male has a prior history of hyperlipidemia, long-standing diabetes mellitus which is insulin-dependent, obesity and hypertension. He has had significant arthritis for which she takes ibuprofen. He noted about a month of intermittent substernal chest discomfort when he would walk up hills it would resolve. On Friday evening he had the onset of tightness in his anterior chest that radiated across both shoulders and somewhat into his left arm that occurred at rest. The symptoms persisted somewhat on Saturday and were not necessarily related to activity. He did not have significant symptoms on Sunday. Today after going to work had the onset of midsternal tightness in particular with radiation down the left arm that persisted. He called his family physician who advised him to come to the emergency room this afternoon and he was seen in the emergency room her initial troponin was negative and EKG was fairly unremarkable. Because of the suggestive history he is admitted to the hospital for treatment of unstable angina. He is having some vague symptoms at the present time.  Past Medical History   Diagnosis  Date   .  Diabetes mellitus    .  Hypertension    .  Hyperlipemia    .  Kidney stone    .  Arthritis     Past Surgical History   Procedure  Date   .  Lithotripsy    .  Kidney stone surgery    .  Surgery on toe    .  Allergies: is allergic to doxycycline; gabapentin; metformin and related; and sulfa antibiotics.  Medications:  Prior to Admission medications   Medication  Sig  Start Date  End Date  Taking?  Authorizing Provider   allopurinol (ZYLOPRIM) 300 MG tablet  Take 300 mg by mouth daily.    Yes  Historical  Provider, MD   Biotin 5000 MCG TABS  Take 1 tablet by mouth daily.    Yes  Historical Provider, MD   Cholecalciferol (VITAMIN D3) 5000 UNITS TABS  Take 1 tablet by mouth daily.    Yes  Historical Provider, MD   fish oil-omega-3 fatty acids 1000 MG capsule  Take 1 g by mouth 2 (two) times daily.    Yes  Historical Provider, MD   ibuprofen (ADVIL,MOTRIN) 200 MG tablet  Take 400 mg by mouth every 6 (six) hours as needed. For pain    Yes  Historical Provider, MD   insulin aspart (NOVOLOG) 100 UNIT/ML injection  Inject 20-30 Units into the skin 3 (three) times daily before meals. Pt is on a sliding scale    Yes  Historical Provider, MD   insulin glargine (LANTUS) 100 UNIT/ML injection  Inject 140 Units into the skin every morning.    Yes  Historical Provider, MD   losartan (COZAAR) 100 MG tablet  Take 100 mg by mouth daily.    Yes  Historical Provider, MD   Multiple Vitamin (MULITIVITAMIN WITH MINERALS) TABS  Take 1 tablet by mouth daily.    Yes  Historical Provider, MD  oxyCODONE-acetaminophen (PERCOCET) 10-325 MG per tablet  Take 0.5 tablets by mouth daily as needed. For pain    Yes  Historical Provider, MD   simvastatin (ZOCOR) 40 MG tablet  Take 40 mg by mouth every morning.    Yes  Historical Provider, MD   vitamin C (ASCORBIC ACID) 500 MG tablet  Take 1,000 mg by mouth 2 (two) times daily.    Yes  Historical Provider, MD   .  Family History:  Mother died of stroke and had a history of cardiovascular disease. His father is living and in his 63s and has a history of a stroke and also myocardial infarction. He has 2 brothers and one sister who are alive and well. A grandfather had diabetes.  Social History:  reports that he has never smoked. He has never used smokeless tobacco. He reports that he drinks alcohol. He reports that he does not use illicit drugs.  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    Review of Systems:At timee of  admission General ROS: Obese for many years, has not been able to get much exercise. Energy level has been good, no fever.  Ophthalmic ROS: Has a previous retinal detachment but no diabetic retinopathy, has had a previous lens implant  ENT ROS: negative  Hematological and Lymphatic ROS: negative for - bleeding problems, bruising or weight loss  Respiratory ROS: no cough, shortness of breath, or wheezing  Cardiovascular ROS: See history of present illness  Gastrointestinal ROS: no abdominal pain, change in bowel habits, or black or bloody stools  Genito-Urinary ROS: Mild nocturia, has a history of uric acid kidney stones and multiple lithotripsies. Passes stones fairly often. Mild erectile dysfunction.  Musculoskeletal ROS: Significant arthritis involving the right foot. He has seen a pain specialist for significant pain and arthritis in the past.  Neurological ROS: no TIA or stroke symptoms, he does have a history of some neuropathic symptoms involving his lower legs.  Other than as noted above, the remainder of the review of systems is normal  Physical Exam: At time of admission BP 167/94  Pulse 77  Temp(Src) 98.9 F (37.2 C) (Oral)  Resp 18  Ht 6' (1.829 m)  Wt 115.667 kg (255 lb)  BMI 34.58 kg/m2  SpO2 96%  General appearance: alert, appears stated age, no distress and moderately obese  Head: Normocephalic, without obvious abnormality, atraumatic  Eyes: Pupils equal bilaterally, EOMI, funduscopic not done  Ears: Normal external canals  Throat: lips, mucosa, and tongue normal; teeth and gums normal  Neck: no adenopathy, no carotid bruit, no JVD, supple, symmetrical, trachea midline and thyroid not enlarged, symmetric, no tenderness/mass/nodules  Lungs: clear to auscultation bilaterally  Chest wall: no tenderness  Heart: regular rate and rhythm, S1, S2 normal, no murmur, click, rub or gallop  Abdomen: soft, non-tender; bowel sounds normal; no masses, no organomegaly  Rectal: deferred   Extremities: extremities normal, atraumatic, no cyanosis or edema  Pulses: 2+ and symmetric  Skin: Skin color, texture, turgor normal. No rashes or lesions  Neurologic: Alert and oriented X 3, normal strength and tone. Normal symmetric reflexes. Normal coordination and gait   Hospital Course: The patient was admitted for further evaluation and treatment to include cardiac catheterization. This was done on 04/28/2011 in the following was noted:  NAKOTA ELSEN  1951-02-20  [id number]  04/28/11  PROCEDURE: Left heart catheterization with selective coronary angiography, left ventriculogram.  INDICATIONS:  Unstable angina  The risks, benefits, and details of the procedure were explained to the patient. The patient verbalized understanding and wanted to proceed. Informed written consent was obtained.  PROCEDURE TECHNIQUE: After Xylocaine anesthesia a 74F sheath was placed in the right femoral artery with a single anterior needle wall stick. Left coronary angiography was done using a Judkins L4 guide catheter. Right coronary angiography was done using a Judkins R4 guide catheter. Left ventriculography was done using a pigtail catheter. The digital images were reviewed with interventional cardiologist and the patient was taken off of the table for sheath removal.  CONTRAST: Total of 80 cc.  COMPLICATIONS: None.  HEMODYNAMICS: Aortic postcontrast: 153/85 LV post contrast 153/15-18. There was no gradient between the left ventricle and aorta.  ANGIOGRAPHIC DATA:  CORONARY ARTERIES: Arise and distribute normally. Right dominant. Moderate coronary calcification is noted.  Left main coronary artery: Somewhat short.  Left anterior descending: Calcified in the midportion. There is a moderate ostial stenosis noted. There is moderate 60% stenosis after the diagonal branch noted with some mild diffuse disease..  Circumflex coronary artery: 70% mid vessel stenosis.  Right coronary artery: Dominant vessel severe  99% segmental stenosis in the midportion somewhat diffuse disease following this, mild distal disease noted.Marland Kitchen  LEFT VENTRICULOGRAM: Performed in the 30 RAO projection. The aortic and mitral valves are normal. There appears to very minimal inferobasal hypokinesis noted. Estimated ejection fraction is 60%.  IMPRESSIONS:  1. 3 vessel coronary artery disease with severe disease noted involving the mid right coronary artery and moderately severe disease involving LAD and circumflex 2. Normal left ventricular function with very minimal inferobasal hypokinesis RECOMMENDATION: Films were reviewed with interventional cardiologist Dr. Eldridge Dace. With the presence of diabetes mellitus and three-vessel coronary artery disease will obtain cardiovascular surgical consultation.   Cardiothoracic surgical consultation was obtained for Charlett Lango M.D. who evaluated the patient and studies and agreed with recommendations for surgical revascularization. The procedure was scheduled and on 04/30/11 he was taken the operating room and underwent the following :  OPERATIVE REPORT  PREOPERATIVE DIAGNOSIS: Severe three-vessel coronary artery disease  with unstable angina.  POSTOPERATIVE DIAGNOSIS: Severe three-vessel coronary artery disease  with unstable angina.  PROCEDURE: Median sternotomy, extracorporeal circulation, coronary  artery bypass grafting x4 (left internal mammary artery to left anterior  descending, left radial artery to obtuse marginal 1, saphenous vein  graft to first diagonal, saphenous vein graft to posterior descending),  open left radial harvest, endoscopic harvest of vein from right thigh.  SURGEON: Salvatore Decent. Dorris Fetch, MD.  ASSISTANT: Rowe Clack, PA-C  SECOND ASSISTANT: Al Corpus.  ANESTHESIA: General.  FINDINGS: Good quality targets, good quality conduits, low oxygen  saturations requiring increasing PEEP post bypass with no other   He tolerated the procedure well was taken  the surgical intensive care unit in stable condition.  Postoperative hospital course:  The patient is overall doing quite well. He is maintained stable hemodynamics. He was weaned from ventilator without difficulty. All routine lines, monitors, drainage devices have been discontinued in the standard fashion. He has remained neurologically intact including left arm following radial artery harvest. He has had some moderate difficulties related to pain which is slowly improved. He did require some physical therapy to his right shoulder and this is also improved with time. Mild postoperative volume overload but has responded well to diuresis. Incisions are healing well without evidence of infection. He is tolerating gradually increasing activities. His blood sugar has been under adequate control. Overall his status  is felt to be stable for discharge on 05/05/2011.   Basename 05/05/11 0550 05/04/11 0530  NA 138 138  K 3.8 4.0  CL 100 101  CO2 29 29  GLUCOSE 236* 172*  BUN 35* 32*  CALCIUM 9.6 9.2    Basename 05/05/11 0550  WBC 6.1  HGB 12.8*  HCT 37.0*  PLT 230   No results found for this basename: INR:2 in the last 72 hours   Discharge Instructions:  The patient is discharged to home with extensive instructions on wound care and progressive ambulation.  They are instructed not to drive or perform any heavy lifting until returning to see the physician in his office.  Discharge Diagnosis:  Unstable angina [411.1] Chest pain cp CAD  Secondary Diagnosis: Patient Active Problem List  Diagnoses  . Unstable angina  . Insulin dependent type 2 diabetes mellitus, controlled  . Hyperlipidemia  . Hypertension  . Obesity (BMI 30-39.9)  . Nephrolithiasis, uric acid  . CAD (coronary artery disease)   Past Medical History  Diagnosis Date  . Diabetes mellitus   . Hypertension   . Hyperlipemia   . Arthritis   . Gout   . Angina   . Coronary artery disease   . Kidney stone     "close  to 100 since age 48"  . Chronic daily headache     "@ least every other day"       Stirling, Orton  Home Medication Instructions [id number]   Printed on:05/05/11 1540  Medication Information                    insulin aspart (NOVOLOG) 100 UNIT/ML injection Inject 20-30 Units into the skin 3 (three) times daily before meals. Pt is on a sliding scale           allopurinol (ZYLOPRIM) 300 MG tablet Take 300 mg by mouth daily.           simvastatin (ZOCOR) 40 MG tablet Take 40 mg by mouth every morning.           vitamin C (ASCORBIC ACID) 500 MG tablet Take 1,000 mg by mouth 2 (two) times daily.           Cholecalciferol (VITAMIN D3) 5000 UNITS TABS Take 1 tablet by mouth daily.           fish oil-omega-3 fatty acids 1000 MG capsule Take 1 g by mouth 2 (two) times daily.           Biotin 5000 MCG TABS Take 1 tablet by mouth daily.           Multiple Vitamin (MULITIVITAMIN WITH MINERALS) TABS Take 1 tablet by mouth daily.           aspirin EC 325 MG EC tablet Take 1 tablet (325 mg total) by mouth daily.           isosorbide mononitrate (IMDUR) 30 MG 24 hr tablet Take 1 tablet (30 mg total) by mouth daily.           losartan (COZAAR) 100 MG tablet Take 0.5 tablets (50 mg total) by mouth daily.           metoprolol tartrate (LOPRESSOR) 25 MG tablet Take 1 tablet (25 mg total) by mouth 2 (two) times daily.           omega-3 acid ethyl esters (LOVAZA) 1 G capsule Take 1 capsule (1 g total) by mouth 2 (two) times daily.  oxyCODONE (ROXICODONE) 15 MG immediate release tablet Take 1 tablet (15 mg total) by mouth every 3 (three) hours as needed.           furosemide (LASIX) 40 MG tablet Take 1 tablet (40 mg total) by mouth daily. For 5 days then stop.           insulin glargine (LANTUS) 100 UNIT/ML injection Inject 100 Units into the skin daily. Patient should gradually increase his insulin dose as his blood sugar increases to his pre op dosage of 140 units SQ  daily.             Disposition:  discharge home  Patient's condition is Good  Gershon Crane, PA-C 05/05/2011  3:40 PM

## 2011-05-06 ENCOUNTER — Telehealth: Payer: Self-pay | Admitting: *Deleted

## 2011-05-06 DIAGNOSIS — R110 Nausea: Secondary | ICD-10-CM

## 2011-05-06 MED ORDER — PROMETHAZINE HCL 12.5 MG PO TABS: 12.5000 mg | ORAL_TABLET | Freq: Four times a day (QID) | ORAL | Status: DC | PRN

## 2011-05-06 NOTE — Telephone Encounter (Signed)
Medication has been prescribed on refill medication encounter.

## 2011-05-11 ENCOUNTER — Ambulatory Visit (INDEPENDENT_AMBULATORY_CARE_PROVIDER_SITE_OTHER): Payer: Self-pay | Admitting: Physician Assistant

## 2011-05-11 VITALS — BP 97/70 | HR 84 | Temp 97.0°F | Resp 16 | Ht 72.0 in | Wt 237.0 lb

## 2011-05-11 DIAGNOSIS — Z09 Encounter for follow-up examination after completed treatment for conditions other than malignant neoplasm: Secondary | ICD-10-CM

## 2011-05-11 DIAGNOSIS — I2510 Atherosclerotic heart disease of native coronary artery without angina pectoris: Secondary | ICD-10-CM

## 2011-05-11 DIAGNOSIS — L539 Erythematous condition, unspecified: Secondary | ICD-10-CM

## 2011-05-11 NOTE — Progress Notes (Signed)
[address]            Mit[place] [address]          [phone]     HPI: Patient is status post coronary artery bypass grafting x4 on 04/30/2011 by Dr. Dorris Lambert. He was discharged home on 05/05/2011 in good condition. He initially did well, however, over the past 2-3 days he has become increasingly weak, with easy fatigability and lack of stamina. Over the last 24 hours he has developed erythema of his left radial artery harvest incision. He denies any fevers or chills but states that the area is mildly tender to touch. He also has had worsening constipation, to the point he has discontinued his pain medication. He has not taken over the counter laxatives at this point. His blood sugars have been fairly well controlled although his appetite is still marginal. Since he has been feeling so poorly, his wife called our office this morning and asked if he could be seen.   Current Outpatient Prescriptions  Medication Sig Dispense Refill  . allopurinol (ZYLOPRIM) 300 MG tablet Take 300 mg by mouth daily.      Marland Kitchen aspirin EC 325 MG EC tablet Take 1 tablet (325 mg total) by mouth daily.  30 tablet    . Biotin 5000 MCG TABS Take 1 tablet by mouth daily.      . Cholecalciferol (VITAMIN D3) 5000 UNITS TABS Take 1 tablet by mouth daily.      . fish oil-omega-3 fatty acids 1000 MG capsule Take 1 g by mouth 2 (two) times daily.      . insulin aspart (NOVOLOG) 100 UNIT/ML injection Inject 20-30 Units into the skin 3 (three) times daily before meals. Pt is on a sliding scale      . insulin glargine (LANTUS) 100 UNIT/ML injection Inject 100 Units into the skin daily. Patient should gradually increase his insulin dose as his blood sugar increases to his pre op dosage of 140 units SQ daily.  10 mL  1  . isosorbide mononitrate (IMDUR) 30 MG 24 hr tablet Take 1 tablet (30 mg total) by mouth daily.  30 tablet  0  . losartan (COZAAR) 100 MG tablet Take 0.5 tablets (50 mg total) by  mouth daily.  30 tablet  1  . metoprolol tartrate (LOPRESSOR) 25 MG tablet Take 1 tablet (25 mg total) by mouth 2 (two) times daily.  60 tablet  1  . Multiple Vitamin (MULITIVITAMIN WITH MINERALS) TABS Take 1 tablet by mouth daily.      Marland Kitchen omega-3 acid ethyl esters (LOVAZA) 1 G capsule Take 1 capsule (1 g total) by mouth 2 (two) times daily.  60 capsule  1  . oxyCODONE (ROXICODONE) 15 MG immediate release tablet Take 1 tablet (15 mg total) by mouth every 3 (three) hours as needed.  50 tablet  0  . promethazine (PHENERGAN) 12.5 MG tablet Take 1 tablet (12.5 mg total) by mouth every 6 (six) hours as needed for nausea (one or two tabs every 6 hrs prn nausea).  30 tablet  0  . simvastatin (ZOCOR) 40 MG tablet Take 40 mg by mouth every morning.      . vitamin C (ASCORBIC ACID) 500 MG tablet Take 1,000 mg by mouth 2 (two) times daily.      . furosemide (LASIX) 40 MG tablet Take 1 tablet (40 mg total)  by mouth daily. For 5 days then stop.  5 tablet  0    Physical Exam: BP 97/70 HR  84 Resp 16 Wounds: his sternal incision has healed well. His left chest tube site has superficially dehisced and there is serosanguinous drainage. No surrounding erythema. There is also small amount of drainage from the right chest tube site as well. The right lower extremity EVH incision is clean and dry. The left radial artery harvest site is moderately erythematous along the length of the incision. Heart: regular rate and rhythm Lungs: decreased breath sounds in bases bilaterally Extremities: mild lower extremity edema     Assessment/Plan: Mitchell Lambert has several issues today. He has some mild erythema of his left arm incision as well as around his chest tube sites. Since he is diabetic, I placed him on Keflex 500 mg 3 times a day x7 days. He also is mildly hypotensive and symptomatic today. He had been restarted on low dose Losartan prior to discharge, in addition to low dose Lopressor. I discontinued the losartan today  and his wife will monitor his blood pressures closely over the next week. He is having constipation issues related to the pain meds, and I have recommended starting an over the counter stool softener if he plans to continue the pain pills.  He may also take an OTC laxative if needed.  He does have a cardiology followup scheduled next week, and an appointment to see [place] the following week. I've asked him keep this appointment provided he is stable and improving, but to call sooner if needed.

## 2011-05-14 ENCOUNTER — Encounter: Payer: Self-pay | Admitting: Thoracic Surgery (Cardiothoracic Vascular Surgery)

## 2011-05-20 ENCOUNTER — Other Ambulatory Visit: Payer: Self-pay | Admitting: Thoracic Surgery (Cardiothoracic Vascular Surgery)

## 2011-05-20 DIAGNOSIS — I2510 Atherosclerotic heart disease of native coronary artery without angina pectoris: Secondary | ICD-10-CM

## 2011-05-28 ENCOUNTER — Encounter: Payer: Self-pay | Admitting: Thoracic Surgery (Cardiothoracic Vascular Surgery)

## 2011-05-28 ENCOUNTER — Ambulatory Visit
Admission: RE | Admit: 2011-05-28 | Discharge: 2011-05-28 | Disposition: A | Discharge status: Home / Self Care | Payer: BC Managed Care – PPO | Source: Ambulatory Visit | Attending: Thoracic Surgery (Cardiothoracic Vascular Surgery) | Admitting: Thoracic Surgery (Cardiothoracic Vascular Surgery)

## 2011-05-28 ENCOUNTER — Ambulatory Visit (INDEPENDENT_AMBULATORY_CARE_PROVIDER_SITE_OTHER): Payer: Self-pay | Admitting: Thoracic Surgery (Cardiothoracic Vascular Surgery)

## 2011-05-28 VITALS — BP 113/64 | HR 64 | Resp 18 | Ht 72.0 in | Wt 230.0 lb

## 2011-05-28 DIAGNOSIS — Z951 Presence of aortocoronary bypass graft: Secondary | ICD-10-CM

## 2011-05-28 DIAGNOSIS — I2510 Atherosclerotic heart disease of native coronary artery without angina pectoris: Secondary | ICD-10-CM

## 2011-05-28 NOTE — Progress Notes (Signed)
HPI: [name] returns for a postoperative followup visit. He presented with unstable angina in February and had three-vessel disease a catheterization. He underwent coronary bypass grafting x4 on 04/30/2011. We did use a left radial artery. His postoperative course was uncomplicated in the hospital. He did develop a wound infection of his radial artery harvest incision a few days after leaving the hospital that was treated with antibiotics.  He states that his been feeling well he has not had any chest pain other than some mild incisional discomfort. He does feel occasional click or popping. He denies any shortness of breath. He feels it is energy levels are improving and his exercise tolerance is improving.  Past Medical History  Diagnosis Date  . Diabetes mellitus   . Hypertension   . Hyperlipemia   . Arthritis   . Gout   . Angina   . Coronary artery disease   . Kidney stone     "close to 100 since age 34"  . Chronic daily headache     "@ least every other day"    Current Outpatient Prescriptions  Medication Sig Dispense Refill  . allopurinol (ZYLOPRIM) 300 MG tablet Take 300 mg by mouth daily.      Marland Kitchen aspirin EC 325 MG EC tablet Take 1 tablet (325 mg total) by mouth daily.  30 tablet    . Biotin 5000 MCG TABS Take 1 tablet by mouth daily.      . Cholecalciferol (VITAMIN D3) 5000 UNITS TABS Take 1 tablet by mouth daily.      . fish oil-omega-3 fatty acids 1000 MG capsule Take 1 g by mouth 2 (two) times daily.      . insulin glargine (LANTUS) 100 UNIT/ML injection Inject 25 Units into the skin daily.      Marland Kitchen LOVAZA 1 G capsule Take 1 g by mouth 2 (two) times daily.       . metoprolol tartrate (LOPRESSOR) 25 MG tablet Take 1 tablet (25 mg total) by mouth 2 (two) times daily.  60 tablet  1  . Multiple Vitamin (MULITIVITAMIN WITH MINERALS) TABS Take 1 tablet by mouth daily.      . simvastatin (ZOCOR) 40 MG tablet Take 40 mg by mouth every morning.      . vitamin C (ASCORBIC ACID) 500  MG tablet Take 1,000 mg by mouth 2 (two) times daily.      Marland Kitchen DISCONTD: insulin glargine (LANTUS) 100 UNIT/ML injection Inject 100 Units into the skin daily. Patient should gradually increase his insulin dose as his blood sugar increases to his pre op dosage of 140 units SQ daily.  10 mL  1  . isosorbide mononitrate (IMDUR) 30 MG 24 hr tablet Take 1 tablet (30 mg total) by mouth daily.  30 tablet  0  . promethazine (PHENERGAN) 12.5 MG tablet Take 1 tablet (12.5 mg total) by mouth every 6 (six) hours as needed for nausea (one or two tabs every 6 hrs prn nausea).  30 tablet  0    Physical Exam: BP 113/64  Pulse 64  Resp 18  Ht 6' (1.829 m)  Wt 230 lb (104.327 kg)  BMI 31.19 kg/m2  SpO2 96%  General well-developed well-nourished 61 year old male in no acute distress Neurologic oriented x3 Lungs clear with equal breath sounds bilaterally Cardiac regular rate and rhythm normal S1 and S2 no rub or murmur Sternum stable, incision clean dry and intact with minimal eschar Chest tube sites open, minimal granulation, debrided and cleaned with  peroxide Left arm incision significant eschar, some desquamation of the skin around the incision Leg incisions healing well No peripheral edema  Diagnostic Tests: Chest x-ray shows postoperative changes there is good aeration of the lungs bilaterally, there were no pleural effusions.  Impression: 62 year old status post coronary bypass grafting x4 for three-vessel coronary disease we did use a left mammary as well as the left radial artery. He has been on Imdur to prevent radial artery spasm. He only has a few those left. As he is now about a month out from the procedure, we can safely discontinue that at this time.  He is not to lift any objects that weight greater than10 pounds or do any other heavy exertion with his upper body for at least another 2 weeks. He may begin driving appropriate precautions were discussed. Advised him not to try to return to work  full-time until 12 weeks after surgery, but he may begin part-time sooner than that, if he feels up to it.  Regarding his open chest tube sites. He will clean that with peroxide the for the next 3 days and cover with a dry dressing. He will use peroxide as needed after that. If he notes any worsening we'll contact us immediately. He will also contact us if it's not healed or close to being healed in 3 weeks.  Plan: Will continue to be followed by Dr. Viann Fish for his cardiac issues.  I will be happy to see him at any time that I can be of any assistance with his care.

## 2011-06-11 ENCOUNTER — Encounter (HOSPITAL_COMMUNITY)
Admission: RE | Admit: 2011-06-11 | Discharge: 2011-06-11 | Disposition: A | Discharge status: Home / Self Care | Payer: BC Managed Care – PPO | Source: Ambulatory Visit | Attending: Cardiology | Admitting: Cardiology

## 2011-06-11 DIAGNOSIS — Z888 Allergy status to other drugs, medicaments and biological substances status: Secondary | ICD-10-CM | POA: Insufficient documentation

## 2011-06-11 DIAGNOSIS — M109 Gout, unspecified: Secondary | ICD-10-CM | POA: Insufficient documentation

## 2011-06-11 DIAGNOSIS — Z881 Allergy status to other antibiotic agents status: Secondary | ICD-10-CM | POA: Insufficient documentation

## 2011-06-11 DIAGNOSIS — I10 Essential (primary) hypertension: Secondary | ICD-10-CM | POA: Insufficient documentation

## 2011-06-11 DIAGNOSIS — E119 Type 2 diabetes mellitus without complications: Secondary | ICD-10-CM | POA: Insufficient documentation

## 2011-06-11 DIAGNOSIS — Z7982 Long term (current) use of aspirin: Secondary | ICD-10-CM | POA: Insufficient documentation

## 2011-06-11 DIAGNOSIS — Z951 Presence of aortocoronary bypass graft: Secondary | ICD-10-CM | POA: Insufficient documentation

## 2011-06-11 DIAGNOSIS — Z794 Long term (current) use of insulin: Secondary | ICD-10-CM | POA: Insufficient documentation

## 2011-06-11 DIAGNOSIS — Z5189 Encounter for other specified aftercare: Secondary | ICD-10-CM | POA: Insufficient documentation

## 2011-06-11 DIAGNOSIS — I2510 Atherosclerotic heart disease of native coronary artery without angina pectoris: Secondary | ICD-10-CM | POA: Insufficient documentation

## 2011-06-11 DIAGNOSIS — I200 Unstable angina: Secondary | ICD-10-CM | POA: Insufficient documentation

## 2011-06-11 DIAGNOSIS — E669 Obesity, unspecified: Secondary | ICD-10-CM | POA: Insufficient documentation

## 2011-06-11 DIAGNOSIS — Z79899 Other long term (current) drug therapy: Secondary | ICD-10-CM | POA: Insufficient documentation

## 2011-06-11 DIAGNOSIS — E785 Hyperlipidemia, unspecified: Secondary | ICD-10-CM | POA: Insufficient documentation

## 2011-06-11 NOTE — Progress Notes (Signed)
Cardiac Rehab Medication Review by a Pharmacist  Does the patient  feel that his/her medications are working for him/her?  yes  Has the patient been experiencing any side effects to the medications prescribed?  no  Does the patient measure his/her own blood pressure or blood glucose at home?  yes   Does the patient have any problems obtaining medications due to transportation or finances?   no  Understanding of regimen: good Understanding of indications: good Potential of compliance: good    Pharmacist comments: Doing well. Measures BP a few times a week and blood glucose twice a day    Janace Litten, PharmD 06/11/2011 9:00 AM

## 2011-06-15 ENCOUNTER — Encounter (HOSPITAL_COMMUNITY)
Admission: RE | Admit: 2011-06-15 | Discharge: 2011-06-15 | Disposition: A | Discharge status: Home / Self Care | Payer: BC Managed Care – PPO | Source: Ambulatory Visit | Attending: Cardiology | Admitting: Cardiology

## 2011-06-15 LAB — GLUCOSE, CAPILLARY
Glucose-Capillary: 142 mg/dL — ABNORMAL HIGH (ref 70–99)
Glucose-Capillary: 138 mg/dL — ABNORMAL HIGH (ref 70–99)

## 2011-06-15 NOTE — Progress Notes (Signed)
Pt started cardiac rehab today.  Pt tolerated light exercise without difficulty. Telemetry normal sinus rhythm vital signs stable.  Will continue to monitor the patient throughout  the program.

## 2011-06-17 ENCOUNTER — Encounter (HOSPITAL_COMMUNITY)
Admission: RE | Admit: 2011-06-17 | Discharge: 2011-06-17 | Disposition: A | Discharge status: Home / Self Care | Payer: BC Managed Care – PPO | Source: Ambulatory Visit | Attending: Cardiology | Admitting: Cardiology

## 2011-06-17 LAB — GLUCOSE, CAPILLARY
Glucose-Capillary: 103 mg/dL — ABNORMAL HIGH (ref 70–99)
Glucose-Capillary: 94 mg/dL (ref 70–99)

## 2011-06-19 ENCOUNTER — Encounter (HOSPITAL_COMMUNITY)
Admission: RE | Admit: 2011-06-19 | Discharge: 2011-06-19 | Disposition: A | Discharge status: Home / Self Care | Payer: BC Managed Care – PPO | Source: Ambulatory Visit | Attending: Cardiology | Admitting: Cardiology

## 2011-06-19 LAB — GLUCOSE, CAPILLARY
Glucose-Capillary: 120 mg/dL — ABNORMAL HIGH (ref 70–99)
Glucose-Capillary: 67 mg/dL — ABNORMAL LOW (ref 70–99)
Glucose-Capillary: 99 mg/dL (ref 70–99)

## 2011-06-19 NOTE — Progress Notes (Signed)
Reviewed home exercise with pt today.  Pt plans to walk at home for exercise.  Reviewed THR, pulse, RPE, sign and symptoms, NTG use, and when to call 911 or MD.  Pt voiced understanding. Yarithza Mink, MA, ACSM RCEP   

## 2011-06-19 NOTE — Progress Notes (Signed)
Pre exercise CBG 120.  Post exercise CBG 67.  Patient asymptomatic.  Patient given glucose gel, graham crackers and water.  Repeat CBG 99. Patient left cardiac rehab without complaints.  Will notify Dr Sharl Ma of patients low CBG via fax.

## 2011-06-22 ENCOUNTER — Encounter (HOSPITAL_COMMUNITY)
Admission: RE | Admit: 2011-06-22 | Discharge: 2011-06-22 | Disposition: A | Discharge status: Home / Self Care | Payer: BC Managed Care – PPO | Source: Ambulatory Visit | Attending: Cardiology | Admitting: Cardiology

## 2011-06-22 LAB — GLUCOSE, CAPILLARY
Glucose-Capillary: 110 mg/dL — ABNORMAL HIGH (ref 70–99)
Glucose-Capillary: 109 mg/dL — ABNORMAL HIGH (ref 70–99)

## 2011-06-22 MED FILL — Glucose Gel 40%: ORAL | Qty: 0.83 | Status: AC

## 2011-06-22 NOTE — Progress Notes (Signed)
Blood sugar 110 pre exercise patient given tang post exercise CBG 109.  Will continue to monitor the patient throughout  the program.

## 2011-06-24 ENCOUNTER — Encounter (HOSPITAL_COMMUNITY)
Admission: RE | Admit: 2011-06-24 | Discharge: 2011-06-24 | Disposition: A | Discharge status: Home / Self Care | Payer: BC Managed Care – PPO | Source: Ambulatory Visit | Attending: Cardiology | Admitting: Cardiology

## 2011-06-24 LAB — GLUCOSE, CAPILLARY
Glucose-Capillary: 202 mg/dL — ABNORMAL HIGH (ref 70–99)
Glucose-Capillary: 133 mg/dL — ABNORMAL HIGH (ref 70–99)

## 2011-06-26 ENCOUNTER — Encounter (HOSPITAL_COMMUNITY)
Admission: RE | Admit: 2011-06-26 | Discharge: 2011-06-26 | Disposition: A | Discharge status: Home / Self Care | Payer: BC Managed Care – PPO | Source: Ambulatory Visit | Attending: Cardiology | Admitting: Cardiology

## 2011-06-26 LAB — GLUCOSE, CAPILLARY
Glucose-Capillary: 121 mg/dL — ABNORMAL HIGH (ref 70–99)
Glucose-Capillary: 103 mg/dL — ABNORMAL HIGH (ref 70–99)
Glucose-Capillary: 71 mg/dL (ref 70–99)

## 2011-06-29 ENCOUNTER — Encounter (HOSPITAL_COMMUNITY)
Admission: RE | Admit: 2011-06-29 | Discharge: 2011-06-29 | Disposition: A | Discharge status: Home / Self Care | Payer: BC Managed Care – PPO | Source: Ambulatory Visit | Attending: Cardiology | Admitting: Cardiology

## 2011-06-30 ENCOUNTER — Other Ambulatory Visit: Payer: Self-pay | Admitting: Surgical

## 2011-06-30 LAB — GLUCOSE, CAPILLARY
Glucose-Capillary: 125 mg/dL — ABNORMAL HIGH (ref 70–99)
Glucose-Capillary: 134 mg/dL — ABNORMAL HIGH (ref 70–99)

## 2011-07-01 ENCOUNTER — Encounter (HOSPITAL_COMMUNITY)
Admission: RE | Admit: 2011-07-01 | Discharge: 2011-07-01 | Disposition: A | Discharge status: Home / Self Care | Payer: BC Managed Care – PPO | Source: Ambulatory Visit | Attending: Cardiology | Admitting: Cardiology

## 2011-07-01 DIAGNOSIS — Z794 Long term (current) use of insulin: Secondary | ICD-10-CM | POA: Insufficient documentation

## 2011-07-01 DIAGNOSIS — Z7982 Long term (current) use of aspirin: Secondary | ICD-10-CM | POA: Insufficient documentation

## 2011-07-01 DIAGNOSIS — E785 Hyperlipidemia, unspecified: Secondary | ICD-10-CM | POA: Insufficient documentation

## 2011-07-01 DIAGNOSIS — Z881 Allergy status to other antibiotic agents status: Secondary | ICD-10-CM | POA: Insufficient documentation

## 2011-07-01 DIAGNOSIS — E119 Type 2 diabetes mellitus without complications: Secondary | ICD-10-CM | POA: Insufficient documentation

## 2011-07-01 DIAGNOSIS — Z79899 Other long term (current) drug therapy: Secondary | ICD-10-CM | POA: Insufficient documentation

## 2011-07-01 DIAGNOSIS — M109 Gout, unspecified: Secondary | ICD-10-CM | POA: Insufficient documentation

## 2011-07-01 DIAGNOSIS — Z888 Allergy status to other drugs, medicaments and biological substances status: Secondary | ICD-10-CM | POA: Insufficient documentation

## 2011-07-01 DIAGNOSIS — I200 Unstable angina: Secondary | ICD-10-CM | POA: Insufficient documentation

## 2011-07-01 DIAGNOSIS — I10 Essential (primary) hypertension: Secondary | ICD-10-CM | POA: Insufficient documentation

## 2011-07-01 DIAGNOSIS — I2510 Atherosclerotic heart disease of native coronary artery without angina pectoris: Secondary | ICD-10-CM | POA: Insufficient documentation

## 2011-07-01 DIAGNOSIS — Z951 Presence of aortocoronary bypass graft: Secondary | ICD-10-CM | POA: Insufficient documentation

## 2011-07-01 DIAGNOSIS — E669 Obesity, unspecified: Secondary | ICD-10-CM | POA: Insufficient documentation

## 2011-07-01 DIAGNOSIS — Z5189 Encounter for other specified aftercare: Secondary | ICD-10-CM | POA: Insufficient documentation

## 2011-07-01 LAB — GLUCOSE, CAPILLARY: Glucose-Capillary: 189 mg/dL — ABNORMAL HIGH (ref 70–99)

## 2011-07-03 ENCOUNTER — Encounter (HOSPITAL_COMMUNITY)
Admission: RE | Admit: 2011-07-03 | Discharge: 2011-07-03 | Disposition: A | Discharge status: Home / Self Care | Payer: BC Managed Care – PPO | Source: Ambulatory Visit | Attending: Cardiology | Admitting: Cardiology

## 2011-07-03 LAB — GLUCOSE, CAPILLARY
Glucose-Capillary: 137 mg/dL — ABNORMAL HIGH (ref 70–99)
Glucose-Capillary: 93 mg/dL (ref 70–99)

## 2011-07-06 ENCOUNTER — Encounter (HOSPITAL_COMMUNITY)
Admission: RE | Admit: 2011-07-06 | Discharge: 2011-07-06 | Disposition: A | Discharge status: Home / Self Care | Payer: BC Managed Care – PPO | Source: Ambulatory Visit | Attending: Cardiology | Admitting: Cardiology

## 2011-07-06 LAB — GLUCOSE, CAPILLARY: Glucose-Capillary: 177 mg/dL — ABNORMAL HIGH (ref 70–99)

## 2011-07-08 ENCOUNTER — Encounter (HOSPITAL_COMMUNITY)
Admission: RE | Admit: 2011-07-08 | Discharge: 2011-07-08 | Disposition: A | Discharge status: Home / Self Care | Payer: BC Managed Care – PPO | Source: Ambulatory Visit | Attending: Cardiology | Admitting: Cardiology

## 2011-07-08 ENCOUNTER — Other Ambulatory Visit: Payer: Self-pay | Admitting: Internal Medicine

## 2011-07-08 DIAGNOSIS — N189 Chronic kidney disease, unspecified: Secondary | ICD-10-CM

## 2011-07-08 LAB — GLUCOSE, CAPILLARY: Glucose-Capillary: 93 mg/dL (ref 70–99)

## 2011-07-10 ENCOUNTER — Encounter (HOSPITAL_COMMUNITY): Payer: BC Managed Care – PPO

## 2011-07-13 ENCOUNTER — Encounter (HOSPITAL_COMMUNITY)
Admission: RE | Admit: 2011-07-13 | Discharge: 2011-07-13 | Disposition: A | Discharge status: Home / Self Care | Payer: BC Managed Care – PPO | Source: Ambulatory Visit | Attending: Cardiology | Admitting: Cardiology

## 2011-07-13 LAB — GLUCOSE, CAPILLARY: Glucose-Capillary: 104 mg/dL — ABNORMAL HIGH (ref 70–99)

## 2011-07-13 NOTE — Progress Notes (Signed)
Mitchell Lambert will be out until next Wed. He is going to the beach. No complaint with exercise today.

## 2011-07-15 ENCOUNTER — Encounter (HOSPITAL_COMMUNITY): Payer: BC Managed Care – PPO

## 2011-07-16 ENCOUNTER — Other Ambulatory Visit: Payer: BC Managed Care – PPO

## 2011-07-17 ENCOUNTER — Encounter (HOSPITAL_COMMUNITY): Payer: BC Managed Care – PPO

## 2011-07-20 ENCOUNTER — Encounter (HOSPITAL_COMMUNITY): Payer: BC Managed Care – PPO

## 2011-07-22 ENCOUNTER — Encounter (HOSPITAL_COMMUNITY): Payer: BC Managed Care – PPO

## 2011-07-24 ENCOUNTER — Encounter (HOSPITAL_COMMUNITY)
Admission: RE | Admit: 2011-07-24 | Discharge: 2011-07-24 | Disposition: A | Discharge status: Home / Self Care | Payer: BC Managed Care – PPO | Source: Ambulatory Visit | Attending: Cardiology | Admitting: Cardiology

## 2011-07-24 LAB — GLUCOSE, CAPILLARY
Glucose-Capillary: 187 mg/dL — ABNORMAL HIGH (ref 70–99)
Glucose-Capillary: 113 mg/dL — ABNORMAL HIGH (ref 70–99)

## 2011-07-29 ENCOUNTER — Ambulatory Visit
Admission: RE | Admit: 2011-07-29 | Discharge: 2011-07-29 | Disposition: A | Discharge status: Home / Self Care | Payer: BC Managed Care – PPO | Source: Ambulatory Visit | Attending: Internal Medicine | Admitting: Internal Medicine

## 2011-07-29 ENCOUNTER — Encounter (HOSPITAL_COMMUNITY)
Admission: RE | Admit: 2011-07-29 | Discharge: 2011-07-29 | Disposition: A | Discharge status: Home / Self Care | Payer: BC Managed Care – PPO | Source: Ambulatory Visit | Attending: Cardiology | Admitting: Cardiology

## 2011-07-29 DIAGNOSIS — N189 Chronic kidney disease, unspecified: Secondary | ICD-10-CM

## 2011-07-29 LAB — GLUCOSE, CAPILLARY: Glucose-Capillary: 161 mg/dL — ABNORMAL HIGH (ref 70–99)

## 2011-07-31 ENCOUNTER — Encounter (HOSPITAL_COMMUNITY)
Admission: RE | Admit: 2011-07-31 | Discharge: 2011-07-31 | Disposition: A | Discharge status: Home / Self Care | Payer: BC Managed Care – PPO | Source: Ambulatory Visit | Attending: Cardiology | Admitting: Cardiology

## 2011-07-31 LAB — GLUCOSE, CAPILLARY
Glucose-Capillary: 129 mg/dL — ABNORMAL HIGH (ref 70–99)
Glucose-Capillary: 77 mg/dL (ref 70–99)

## 2011-08-03 ENCOUNTER — Encounter (HOSPITAL_COMMUNITY)
Admission: RE | Admit: 2011-08-03 | Discharge: 2011-08-03 | Disposition: A | Discharge status: Home / Self Care | Payer: BC Managed Care – PPO | Source: Ambulatory Visit | Attending: Cardiology | Admitting: Cardiology

## 2011-08-03 DIAGNOSIS — Z5189 Encounter for other specified aftercare: Secondary | ICD-10-CM | POA: Insufficient documentation

## 2011-08-03 DIAGNOSIS — Z951 Presence of aortocoronary bypass graft: Secondary | ICD-10-CM | POA: Insufficient documentation

## 2011-08-03 DIAGNOSIS — I2510 Atherosclerotic heart disease of native coronary artery without angina pectoris: Secondary | ICD-10-CM | POA: Insufficient documentation

## 2011-08-03 DIAGNOSIS — E669 Obesity, unspecified: Secondary | ICD-10-CM | POA: Insufficient documentation

## 2011-08-03 DIAGNOSIS — I10 Essential (primary) hypertension: Secondary | ICD-10-CM | POA: Insufficient documentation

## 2011-08-03 DIAGNOSIS — M109 Gout, unspecified: Secondary | ICD-10-CM | POA: Insufficient documentation

## 2011-08-03 DIAGNOSIS — Z794 Long term (current) use of insulin: Secondary | ICD-10-CM | POA: Insufficient documentation

## 2011-08-03 DIAGNOSIS — Z79899 Other long term (current) drug therapy: Secondary | ICD-10-CM | POA: Insufficient documentation

## 2011-08-03 DIAGNOSIS — E119 Type 2 diabetes mellitus without complications: Secondary | ICD-10-CM | POA: Insufficient documentation

## 2011-08-03 DIAGNOSIS — I200 Unstable angina: Secondary | ICD-10-CM | POA: Insufficient documentation

## 2011-08-03 DIAGNOSIS — Z7982 Long term (current) use of aspirin: Secondary | ICD-10-CM | POA: Insufficient documentation

## 2011-08-03 DIAGNOSIS — Z888 Allergy status to other drugs, medicaments and biological substances status: Secondary | ICD-10-CM | POA: Insufficient documentation

## 2011-08-03 DIAGNOSIS — Z881 Allergy status to other antibiotic agents status: Secondary | ICD-10-CM | POA: Insufficient documentation

## 2011-08-03 DIAGNOSIS — E785 Hyperlipidemia, unspecified: Secondary | ICD-10-CM | POA: Insufficient documentation

## 2011-08-03 LAB — GLUCOSE, CAPILLARY
Glucose-Capillary: 207 mg/dL — ABNORMAL HIGH (ref 70–99)
Glucose-Capillary: 137 mg/dL — ABNORMAL HIGH (ref 70–99)

## 2011-08-05 ENCOUNTER — Encounter (HOSPITAL_COMMUNITY)
Admission: RE | Admit: 2011-08-05 | Discharge: 2011-08-05 | Disposition: A | Discharge status: Home / Self Care | Payer: BC Managed Care – PPO | Source: Ambulatory Visit | Attending: Cardiology | Admitting: Cardiology

## 2011-08-05 LAB — GLUCOSE, CAPILLARY: Glucose-Capillary: 120 mg/dL — ABNORMAL HIGH (ref 70–99)

## 2011-08-07 ENCOUNTER — Encounter (HOSPITAL_COMMUNITY)
Admission: RE | Admit: 2011-08-07 | Discharge: 2011-08-07 | Disposition: A | Discharge status: Home / Self Care | Payer: BC Managed Care – PPO | Source: Ambulatory Visit | Attending: Cardiology | Admitting: Cardiology

## 2011-08-07 LAB — GLUCOSE, CAPILLARY: Glucose-Capillary: 115 mg/dL — ABNORMAL HIGH (ref 70–99)

## 2011-08-10 ENCOUNTER — Encounter (HOSPITAL_COMMUNITY)
Admission: RE | Admit: 2011-08-10 | Discharge: 2011-08-10 | Disposition: A | Discharge status: Home / Self Care | Payer: BC Managed Care – PPO | Source: Ambulatory Visit | Attending: Cardiology | Admitting: Cardiology

## 2011-08-10 LAB — GLUCOSE, CAPILLARY: Glucose-Capillary: 138 mg/dL — ABNORMAL HIGH (ref 70–99)

## 2011-08-12 ENCOUNTER — Encounter (HOSPITAL_COMMUNITY)
Admission: RE | Admit: 2011-08-12 | Discharge: 2011-08-12 | Disposition: A | Discharge status: Home / Self Care | Payer: BC Managed Care – PPO | Source: Ambulatory Visit | Attending: Cardiology | Admitting: Cardiology

## 2011-08-12 LAB — GLUCOSE, CAPILLARY: Glucose-Capillary: 144 mg/dL — ABNORMAL HIGH (ref 70–99)

## 2011-08-14 ENCOUNTER — Encounter (HOSPITAL_COMMUNITY)
Admission: RE | Admit: 2011-08-14 | Discharge: 2011-08-14 | Disposition: A | Discharge status: Home / Self Care | Payer: BC Managed Care – PPO | Source: Ambulatory Visit | Attending: Cardiology | Admitting: Cardiology

## 2011-08-17 ENCOUNTER — Encounter (HOSPITAL_COMMUNITY): Payer: BC Managed Care – PPO

## 2011-08-19 ENCOUNTER — Encounter (HOSPITAL_COMMUNITY)
Admission: RE | Admit: 2011-08-19 | Discharge: 2011-08-19 | Disposition: A | Discharge status: Home / Self Care | Payer: BC Managed Care – PPO | Source: Ambulatory Visit | Attending: Cardiology | Admitting: Cardiology

## 2011-08-19 LAB — GLUCOSE, CAPILLARY: Glucose-Capillary: 113 mg/dL — ABNORMAL HIGH (ref 70–99)

## 2011-08-21 ENCOUNTER — Encounter (HOSPITAL_COMMUNITY)
Admission: RE | Admit: 2011-08-21 | Discharge: 2011-08-21 | Disposition: A | Discharge status: Home / Self Care | Payer: BC Managed Care – PPO | Source: Ambulatory Visit | Attending: Cardiology | Admitting: Cardiology

## 2011-08-24 ENCOUNTER — Encounter (HOSPITAL_COMMUNITY)
Admission: RE | Admit: 2011-08-24 | Discharge: 2011-08-24 | Disposition: A | Discharge status: Home / Self Care | Payer: BC Managed Care – PPO | Source: Ambulatory Visit | Attending: Cardiology | Admitting: Cardiology

## 2011-08-24 LAB — GLUCOSE, CAPILLARY: Glucose-Capillary: 114 mg/dL — ABNORMAL HIGH (ref 70–99)

## 2011-08-25 LAB — GLUCOSE, CAPILLARY: Glucose-Capillary: 132 mg/dL — ABNORMAL HIGH (ref 70–99)

## 2011-08-26 ENCOUNTER — Encounter (HOSPITAL_COMMUNITY)
Admission: RE | Admit: 2011-08-26 | Discharge: 2011-08-26 | Disposition: A | Discharge status: Home / Self Care | Payer: BC Managed Care – PPO | Source: Ambulatory Visit | Attending: Cardiology | Admitting: Cardiology

## 2011-08-26 LAB — GLUCOSE, CAPILLARY: Glucose-Capillary: 115 mg/dL — ABNORMAL HIGH (ref 70–99)

## 2011-08-28 ENCOUNTER — Encounter (HOSPITAL_COMMUNITY)
Admission: RE | Admit: 2011-08-28 | Discharge: 2011-08-28 | Disposition: A | Discharge status: Home / Self Care | Payer: BC Managed Care – PPO | Source: Ambulatory Visit | Attending: Cardiology | Admitting: Cardiology

## 2011-08-28 LAB — GLUCOSE, CAPILLARY: Glucose-Capillary: 111 mg/dL — ABNORMAL HIGH (ref 70–99)

## 2011-08-28 NOTE — Progress Notes (Signed)
Mitchell Lambert graduates today from cardiac rehab. Blood pressure noted at 168/70 on the rower.  Patient moved to the track subsequent blood pressures improved.  Patient c/o plantar fasciitis.  Exit blood pressure 122/64.  Will fax exercise flow sheets for review  To Dr Donnie Aho.

## 2011-08-31 ENCOUNTER — Encounter (HOSPITAL_COMMUNITY): Payer: BC Managed Care – PPO | Attending: Cardiology

## 2011-08-31 DIAGNOSIS — Z794 Long term (current) use of insulin: Secondary | ICD-10-CM | POA: Insufficient documentation

## 2011-08-31 DIAGNOSIS — I200 Unstable angina: Secondary | ICD-10-CM | POA: Insufficient documentation

## 2011-08-31 DIAGNOSIS — Z881 Allergy status to other antibiotic agents status: Secondary | ICD-10-CM | POA: Insufficient documentation

## 2011-08-31 DIAGNOSIS — M109 Gout, unspecified: Secondary | ICD-10-CM | POA: Insufficient documentation

## 2011-08-31 DIAGNOSIS — E785 Hyperlipidemia, unspecified: Secondary | ICD-10-CM | POA: Insufficient documentation

## 2011-08-31 DIAGNOSIS — Z951 Presence of aortocoronary bypass graft: Secondary | ICD-10-CM | POA: Insufficient documentation

## 2011-08-31 DIAGNOSIS — E669 Obesity, unspecified: Secondary | ICD-10-CM | POA: Insufficient documentation

## 2011-08-31 DIAGNOSIS — Z888 Allergy status to other drugs, medicaments and biological substances status: Secondary | ICD-10-CM | POA: Insufficient documentation

## 2011-08-31 DIAGNOSIS — E119 Type 2 diabetes mellitus without complications: Secondary | ICD-10-CM | POA: Insufficient documentation

## 2011-08-31 DIAGNOSIS — Z5189 Encounter for other specified aftercare: Secondary | ICD-10-CM | POA: Insufficient documentation

## 2011-08-31 DIAGNOSIS — Z79899 Other long term (current) drug therapy: Secondary | ICD-10-CM | POA: Insufficient documentation

## 2011-08-31 DIAGNOSIS — Z7982 Long term (current) use of aspirin: Secondary | ICD-10-CM | POA: Insufficient documentation

## 2011-08-31 DIAGNOSIS — I2510 Atherosclerotic heart disease of native coronary artery without angina pectoris: Secondary | ICD-10-CM | POA: Insufficient documentation

## 2011-08-31 DIAGNOSIS — I10 Essential (primary) hypertension: Secondary | ICD-10-CM | POA: Insufficient documentation

## 2011-09-02 ENCOUNTER — Encounter (HOSPITAL_COMMUNITY): Payer: BC Managed Care – PPO

## 2011-09-04 ENCOUNTER — Encounter (HOSPITAL_COMMUNITY): Payer: BC Managed Care – PPO

## 2011-09-07 ENCOUNTER — Encounter (HOSPITAL_COMMUNITY): Payer: BC Managed Care – PPO

## 2011-09-09 ENCOUNTER — Encounter (HOSPITAL_COMMUNITY): Payer: BC Managed Care – PPO

## 2011-09-11 ENCOUNTER — Encounter (HOSPITAL_COMMUNITY): Payer: BC Managed Care – PPO

## 2011-09-14 ENCOUNTER — Encounter (HOSPITAL_COMMUNITY): Payer: BC Managed Care – PPO

## 2011-09-16 ENCOUNTER — Encounter (HOSPITAL_COMMUNITY): Payer: BC Managed Care – PPO

## 2011-09-18 ENCOUNTER — Encounter (HOSPITAL_COMMUNITY): Payer: BC Managed Care – PPO

## 2011-09-21 ENCOUNTER — Ambulatory Visit (HOSPITAL_COMMUNITY): Payer: BC Managed Care – PPO

## 2011-09-23 ENCOUNTER — Ambulatory Visit (HOSPITAL_COMMUNITY): Payer: BC Managed Care – PPO

## 2011-09-25 ENCOUNTER — Ambulatory Visit (HOSPITAL_COMMUNITY): Payer: BC Managed Care – PPO

## 2011-09-28 ENCOUNTER — Ambulatory Visit (HOSPITAL_COMMUNITY): Payer: BC Managed Care – PPO

## 2011-09-30 ENCOUNTER — Ambulatory Visit (HOSPITAL_COMMUNITY): Payer: BC Managed Care – PPO

## 2011-10-02 ENCOUNTER — Ambulatory Visit (HOSPITAL_COMMUNITY): Payer: BC Managed Care – PPO

## 2011-10-05 ENCOUNTER — Ambulatory Visit (HOSPITAL_COMMUNITY): Payer: BC Managed Care – PPO

## 2011-10-07 ENCOUNTER — Ambulatory Visit (HOSPITAL_COMMUNITY): Payer: BC Managed Care – PPO

## 2011-10-09 ENCOUNTER — Ambulatory Visit (HOSPITAL_COMMUNITY): Payer: BC Managed Care – PPO

## 2011-10-12 ENCOUNTER — Ambulatory Visit (HOSPITAL_COMMUNITY): Payer: BC Managed Care – PPO

## 2011-10-14 ENCOUNTER — Ambulatory Visit (HOSPITAL_COMMUNITY): Payer: BC Managed Care – PPO

## 2011-10-16 ENCOUNTER — Ambulatory Visit (HOSPITAL_COMMUNITY): Payer: BC Managed Care – PPO

## 2012-08-16 ENCOUNTER — Ambulatory Visit (INDEPENDENT_AMBULATORY_CARE_PROVIDER_SITE_OTHER): Payer: BC Managed Care – PPO | Admitting: Emergency Medicine

## 2012-08-16 VITALS — BP 158/90 | HR 86 | Temp 98.0°F | Resp 16 | Ht 72.0 in | Wt 232.0 lb

## 2012-08-16 DIAGNOSIS — N201 Calculus of ureter: Secondary | ICD-10-CM

## 2012-08-16 DIAGNOSIS — R109 Unspecified abdominal pain: Secondary | ICD-10-CM

## 2012-08-16 LAB — POCT URINALYSIS DIPSTICK
Bilirubin, UA: NEGATIVE
Glucose, UA: NEGATIVE
Ketones, UA: NEGATIVE
Leukocytes, UA: NEGATIVE
Nitrite, UA: NEGATIVE
Protein, UA: NEGATIVE
Spec Grav, UA: 1.025
Urobilinogen, UA: 0.2
pH, UA: 5

## 2012-08-16 LAB — POCT UA - MICROSCOPIC ONLY
Casts, Ur, LPF, POC: NEGATIVE
Crystals, Ur, HPF, POC: NEGATIVE
Epithelial cells, urine per micros: NEGATIVE
Mucus, UA: NEGATIVE
Yeast, UA: NEGATIVE

## 2012-08-16 MED ORDER — OXYCODONE-ACETAMINOPHEN 5-325 MG PO TABS: 1.0000 | ORAL_TABLET | ORAL | Status: DC | PRN

## 2012-08-16 MED ORDER — KETOROLAC TROMETHAMINE 60 MG/2ML IM SOLN
60.0000 mg | Freq: Once | INTRAMUSCULAR | Status: AC
  Administered 2012-08-16: 60 mg via INTRAMUSCULAR

## 2012-08-16 NOTE — Patient Instructions (Addendum)

## 2012-08-16 NOTE — Progress Notes (Signed)
[facility] [address] [place] [address] [phone]  Date:  08/16/2012   Name:  Mitchell Lambert   DOB:  November 21, 1950   MRN:  [id number]  PCP:  Daisy Floro, MD    Chief Complaint: Nephrolithiasis   History of Present Illness:  Mitchell Lambert is a 62 y.o. very pleasant male patient who presents with the following:  History of recurrent kidney stones.  Usually pass promptly.  Awoke at 0300 with right flank pain and nausea.  No vomiting.  No stool change.  No fever or chills.  Has been subjected to 5 ECSWL procedures. 4 vessel CABG 5 months ago.  No improvement with over the counter medications or other home remedies. Denies other complaint or health concern today.   Patient Active Problem List   Diagnosis Date Noted  . CAD (coronary artery disease) 04/28/2011  . Unstable angina   . Insulin dependent type 2 diabetes mellitus, controlled   . Hyperlipidemia   . Hypertension   . Obesity (BMI 30-39.9)   . Nephrolithiasis, uric acid     Past Medical History  Diagnosis Date  . Diabetes mellitus   . Hypertension   . Hyperlipemia   . Arthritis   . Gout   . Angina   . Coronary artery disease   . Kidney stone     "close to 100 since age 24"  . Chronic daily headache     "@ least every other day"    Past Surgical History  Procedure Laterality Date  . Lithotripsy      "many; probably 5 times"  . Kidney stone surgery      "probably 3 times"  . Cardiac catheterization  04/27/11  . Cataract extraction w/ intraocular lens implant  1990's    right; "lens was replaced twice over 3 months; lens is actually over iris"  . Retinal detachment surgery  1990's    right eye; "made me have an early cataract"  . Bone spur  1990's    right great toe; "probably related to gout"  . Coronary artery bypass graft  04/30/2011    Procedure: CORONARY ARTERY BYPASS GRAFTING (CABG);  Surgeon: Loreli Slot, MD;  Location: [facility] OR;  Service: Open Heart Surgery;   Laterality: N/A;  . Radial artery harvest  04/30/2011    Procedure: RADIAL ARTERY HARVEST;  Surgeon: Loreli Slot, MD;  Location: [facility] OR;  Service: Open Heart Surgery;  Laterality: Left;    History  Substance Use Topics  . Smoking status: Never Smoker   . Smokeless tobacco: Never Used  . Alcohol Use: Yes     Comment: 04/27/11 "2 mixed drinks plus 2  beers/month"    No family history on file.  Allergies  Allergen Reactions  . Gabapentin Other (See Comments)    "made me so dizzy I missed work for 2 days"  . Metformin And Related Shortness Of Breath  . Sulfa Antibiotics Shortness Of Breath and Rash  . Doxycycline Rash    Medication list has been reviewed and updated.  Current Outpatient Prescriptions on File Prior to Visit  Medication Sig Dispense Refill  . allopurinol (ZYLOPRIM) 300 MG tablet Take 300 mg by mouth daily.      Marland Kitchen aspirin EC 325 MG EC tablet Take 1 tablet (325 mg total) by mouth daily.  30 tablet    . Biotin 5000 MCG TABS Take 1 tablet by mouth daily.      . Cholecalciferol (VITAMIN  D3) 5000 UNITS TABS Take 1 tablet by mouth daily.      . fish oil-omega-3 fatty acids 1000 MG capsule Take 1 g by mouth 2 (two) times daily.      Marland Kitchen HYDROcodone-acetaminophen (NORCO) 10-325 MG per tablet Take 1 tablet by mouth every 6 (six) hours as needed.      Marland Kitchen LOVAZA 1 G capsule Take 1 g by mouth 2 (two) times daily.       . Multiple Vitamin (MULITIVITAMIN WITH MINERALS) TABS Take 1 tablet by mouth daily.      . simvastatin (ZOCOR) 40 MG tablet Take 40 mg by mouth every morning.      . vitamin C (ASCORBIC ACID) 500 MG tablet Take 1,000 mg by mouth 2 (two) times daily.      . insulin glargine (LANTUS) 100 UNIT/ML injection Inject 25 Units into the skin daily.      . isosorbide mononitrate (IMDUR) 30 MG 24 hr tablet Take 1 tablet (30 mg total) by mouth daily.  30 tablet  0  . metoprolol tartrate (LOPRESSOR) 25 MG tablet Take 1 tablet (25 mg total) by mouth 2 (two) times daily.  60  tablet  1   No current facility-administered medications on file prior to visit.    Review of Systems: As per HPI, otherwise negative.    Physical Examination: Filed Vitals:   08/16/12 0850  BP: 158/90  Pulse: 86  Temp: 98 F (36.7 C)  Resp: 16   Filed Vitals:   08/16/12 0850  Height: 6' (1.829 m)  Weight: 232 lb (105.235 kg)   Body mass index is 31.46 kg/(m^2). Ideal Body Weight: Weight in (lb) to have BMI = 25: 183.9   GEN: WDWN, very uncomfortable marked pain, Non-toxic, Alert & Oriented x 3 HEENT: Atraumatic, Normocephalic.  Ears and Nose: No external deformity. EXTR: No clubbing/cyanosis/edema Abdomen benign no visceromegaly.  Marked right CVA tenderness  NEURO: Normal gait.  PSYCH: Normally interactive. Conversant. Not depressed or anxious appearing.  Calm demeanor.    Assessment and Plan: Ureterolithiasis toradol  Signed,  Phillips Odor, MD   Results for orders placed in visit on 08/16/12  POCT UA - MICROSCOPIC ONLY      Result Value Range   WBC, Ur, HPF, POC 0-1     RBC, urine, microscopic 3-5     Bacteria, U Microscopic trace     Mucus, UA neg     Epithelial cells, urine per micros neg     Crystals, Ur, HPF, POC neg     Casts, Ur, LPF, POC neg     Yeast, UA neg    POCT URINALYSIS DIPSTICK      Result Value Range   Color, UA yellow     Clarity, UA clear     Glucose, UA neg     Bilirubin, UA neg     Ketones, UA neg     Spec Grav, UA 1.025     Blood, UA small     pH, UA 5.0     Protein, UA neg     Urobilinogen, UA 0.2     Nitrite, UA neg     Leukocytes, UA Negative

## 2012-08-19 ENCOUNTER — Encounter (HOSPITAL_COMMUNITY): Payer: Self-pay | Admitting: Anesthesiology

## 2012-08-19 ENCOUNTER — Ambulatory Visit (HOSPITAL_COMMUNITY)
Admission: RE | Admit: 2012-08-19 | Discharge: 2012-08-19 | Disposition: A | Discharge status: Home / Self Care | Payer: BC Managed Care – PPO | Source: Ambulatory Visit | Attending: Urology | Admitting: Urology

## 2012-08-19 ENCOUNTER — Encounter (HOSPITAL_COMMUNITY)
Admission: RE | Disposition: A | Discharge status: Home / Self Care | Payer: Self-pay | Source: Ambulatory Visit | Attending: Urology

## 2012-08-19 ENCOUNTER — Encounter (HOSPITAL_COMMUNITY): Payer: Self-pay | Admitting: *Deleted

## 2012-08-19 ENCOUNTER — Ambulatory Visit (HOSPITAL_COMMUNITY): Payer: BC Managed Care – PPO

## 2012-08-19 ENCOUNTER — Ambulatory Visit (HOSPITAL_COMMUNITY): Payer: BC Managed Care – PPO | Admitting: Anesthesiology

## 2012-08-19 ENCOUNTER — Other Ambulatory Visit: Payer: Self-pay | Admitting: Urology

## 2012-08-19 DIAGNOSIS — E785 Hyperlipidemia, unspecified: Secondary | ICD-10-CM | POA: Insufficient documentation

## 2012-08-19 DIAGNOSIS — Z951 Presence of aortocoronary bypass graft: Secondary | ICD-10-CM | POA: Insufficient documentation

## 2012-08-19 DIAGNOSIS — N201 Calculus of ureter: Secondary | ICD-10-CM | POA: Insufficient documentation

## 2012-08-19 DIAGNOSIS — I2510 Atherosclerotic heart disease of native coronary artery without angina pectoris: Secondary | ICD-10-CM | POA: Insufficient documentation

## 2012-08-19 DIAGNOSIS — I10 Essential (primary) hypertension: Secondary | ICD-10-CM | POA: Insufficient documentation

## 2012-08-19 DIAGNOSIS — E119 Type 2 diabetes mellitus without complications: Secondary | ICD-10-CM | POA: Insufficient documentation

## 2012-08-19 HISTORY — PX: CYSTOSCOPY W/ URETERAL STENT PLACEMENT: SHX1429

## 2012-08-19 LAB — CBC
HCT: 41.2 % (ref 39.0–52.0)
Hemoglobin: 14.5 g/dL (ref 13.0–17.0)
MCH: 31 pg (ref 26.0–34.0)
MCHC: 35.2 g/dL (ref 30.0–36.0)
MCV: 88 fL (ref 78.0–100.0)
Platelets: 142 10*3/uL — ABNORMAL LOW (ref 150–400)
RBC: 4.68 MIL/uL (ref 4.22–5.81)
RDW: 12.9 % (ref 11.5–15.5)
WBC: 7.1 10*3/uL (ref 4.0–10.5)

## 2012-08-19 LAB — BASIC METABOLIC PANEL
BUN: 47 mg/dL — ABNORMAL HIGH (ref 6–23)
CO2: 24 mEq/L (ref 19–32)
Calcium: 9.4 mg/dL (ref 8.4–10.5)
Chloride: 103 mEq/L (ref 96–112)
Creatinine, Ser: 3.81 mg/dL — ABNORMAL HIGH (ref 0.50–1.35)
GFR calc Af Amer: 18 mL/min — ABNORMAL LOW (ref >90)
GFR calc non Af Amer: 16 mL/min — ABNORMAL LOW (ref >90)
Glucose, Bld: 191 mg/dL — ABNORMAL HIGH (ref 70–99)
Potassium: 5.5 mEq/L — ABNORMAL HIGH (ref 3.5–5.1)
Sodium: 137 mEq/L (ref 135–145)

## 2012-08-19 LAB — SURGICAL PCR SCREEN
MRSA, PCR: NEGATIVE
Staphylococcus aureus: POSITIVE — AB

## 2012-08-19 LAB — GLUCOSE, CAPILLARY: Glucose-Capillary: 158 mg/dL — ABNORMAL HIGH (ref 70–99)

## 2012-08-19 SURGERY — CYSTOSCOPY, WITH RETROGRADE PYELOGRAM AND URETERAL STENT INSERTION
Anesthesia: General | Laterality: Right | Wound class: Clean Contaminated

## 2012-08-19 MED ORDER — LACTATED RINGERS IV SOLN: INTRAVENOUS | Status: DC

## 2012-08-19 MED ORDER — CIPROFLOXACIN IN D5W 400 MG/200ML IV SOLN
INTRAVENOUS | Status: AC
  Filled 2012-08-19: qty 200

## 2012-08-19 MED ORDER — ONDANSETRON HCL 4 MG/2ML IJ SOLN
INTRAMUSCULAR | Status: DC | PRN
  Administered 2012-08-19: 4 mg via INTRAVENOUS

## 2012-08-19 MED ORDER — SODIUM CHLORIDE 0.9 % IR SOLN
Status: DC | PRN
  Administered 2012-08-19: 3000 mL

## 2012-08-19 MED ORDER — MIDAZOLAM HCL 5 MG/5ML IJ SOLN
INTRAMUSCULAR | Status: DC | PRN
  Administered 2012-08-19: 2 mg via INTRAVENOUS

## 2012-08-19 MED ORDER — FENTANYL CITRATE 0.05 MG/ML IJ SOLN
50.0000 ug | Freq: Once | INTRAMUSCULAR | Status: AC
  Administered 2012-08-19: 100 ug via INTRAVENOUS

## 2012-08-19 MED ORDER — MUPIROCIN 2 % EX OINT
TOPICAL_OINTMENT | Freq: Two times a day (BID) | CUTANEOUS | Status: DC
  Filled 2012-08-19: qty 22

## 2012-08-19 MED ORDER — CIPROFLOXACIN IN D5W 400 MG/200ML IV SOLN
400.0000 mg | INTRAVENOUS | Status: AC
  Administered 2012-08-19: 400 mg via INTRAVENOUS

## 2012-08-19 MED ORDER — OXYBUTYNIN CHLORIDE 5 MG PO TABS: 5.0000 mg | ORAL_TABLET | Freq: Four times a day (QID) | ORAL | Status: DC | PRN

## 2012-08-19 MED ORDER — LACTATED RINGERS IV SOLN
INTRAVENOUS | Status: DC
  Administered 2012-08-19: 1000 mL via INTRAVENOUS

## 2012-08-19 MED ORDER — OXYCODONE-ACETAMINOPHEN 5-325 MG PO TABS: 1.0000 | ORAL_TABLET | ORAL | Status: DC | PRN

## 2012-08-19 MED ORDER — BELLADONNA ALKALOIDS-OPIUM 16.2-60 MG RE SUPP
RECTAL | Status: AC
  Filled 2012-08-19: qty 1

## 2012-08-19 MED ORDER — FENTANYL CITRATE 0.05 MG/ML IJ SOLN: 25.0000 ug | INTRAMUSCULAR | Status: DC | PRN

## 2012-08-19 MED ORDER — IOHEXOL 300 MG/ML  SOLN
INTRAMUSCULAR | Status: AC
  Filled 2012-08-19: qty 1

## 2012-08-19 MED ORDER — LIDOCAINE HCL 2 % EX GEL
CUTANEOUS | Status: AC
  Filled 2012-08-19: qty 10

## 2012-08-19 MED ORDER — MUPIROCIN 2 % EX OINT: TOPICAL_OINTMENT | Freq: Two times a day (BID) | CUTANEOUS | Status: DC

## 2012-08-19 MED ORDER — PROPOFOL 10 MG/ML IV BOLUS
INTRAVENOUS | Status: DC | PRN
  Administered 2012-08-19: 150 mg via INTRAVENOUS
  Administered 2012-08-19: 50 mg via INTRAVENOUS

## 2012-08-19 MED ORDER — LIDOCAINE HCL (CARDIAC) 20 MG/ML IV SOLN
INTRAVENOUS | Status: DC | PRN
  Administered 2012-08-19: 100 mg via INTRAVENOUS

## 2012-08-19 MED ORDER — PHENAZOPYRIDINE HCL 100 MG PO TABS: 100.0000 mg | ORAL_TABLET | Freq: Three times a day (TID) | ORAL | Status: DC | PRN

## 2012-08-19 MED ORDER — BELLADONNA ALKALOIDS-OPIUM 16.2-60 MG RE SUPP
RECTAL | Status: DC | PRN
  Administered 2012-08-19: 1 via RECTAL

## 2012-08-19 MED ORDER — EPHEDRINE SULFATE 50 MG/ML IJ SOLN
INTRAMUSCULAR | Status: DC | PRN
  Administered 2012-08-19: 5 mg via INTRAVENOUS

## 2012-08-19 MED ORDER — FENTANYL CITRATE 0.05 MG/ML IJ SOLN
INTRAMUSCULAR | Status: DC | PRN
  Administered 2012-08-19 (×2): 50 ug via INTRAVENOUS

## 2012-08-19 MED ORDER — IOHEXOL 300 MG/ML  SOLN
INTRAMUSCULAR | Status: DC | PRN
  Administered 2012-08-19: 10 mL

## 2012-08-19 MED ORDER — FENTANYL CITRATE 0.05 MG/ML IJ SOLN
INTRAMUSCULAR | Status: AC
  Filled 2012-08-19: qty 2

## 2012-08-19 MED ORDER — HYOSCYAMINE SULFATE 0.125 MG PO TABS: 0.1250 mg | ORAL_TABLET | ORAL | Status: DC | PRN

## 2012-08-19 MED ORDER — SENNOSIDES-DOCUSATE SODIUM 8.6-50 MG PO TABS: 1.0000 | ORAL_TABLET | Freq: Two times a day (BID) | ORAL | Status: DC

## 2012-08-19 MED ORDER — LIDOCAINE HCL 2 % EX GEL
CUTANEOUS | Status: DC | PRN
  Administered 2012-08-19: 1 via URETHRAL

## 2012-08-19 SURGICAL SUPPLY — 22 items
ADAPTER CATH URET PLST 4-6FR (CATHETERS) ×1 IMPLANT
ADPR CATH URET STRL DISP 4-6FR (CATHETERS)
BAG URO CATCHER STRL LF (DRAPE) ×3 IMPLANT
BASKET ZERO TIP NITINOL 2.4FR (BASKET) IMPLANT
BSKT STON RTRVL ZERO TP 2.4FR (BASKET)
CATH URET 5FR 28IN OPEN ENDED (CATHETERS) ×2 IMPLANT
CLOTH BEACON ORANGE TIMEOUT ST (SAFETY) ×3 IMPLANT
DRAPE CAMERA CLOSED 9X96 (DRAPES) ×3 IMPLANT
GLOVE BIOGEL M 7.0 STRL (GLOVE) ×2 IMPLANT
GLOVE BIOGEL PI IND STRL 7.0 (GLOVE) ×1 IMPLANT
GLOVE BIOGEL PI INDICATOR 7.0 (GLOVE)
GOWN PREVENTION PLUS XLARGE (GOWN DISPOSABLE) ×1 IMPLANT
GOWN STRL NON-REIN LRG LVL3 (GOWN DISPOSABLE) ×1 IMPLANT
GOWN STRL REIN XL XLG (GOWN DISPOSABLE) ×4 IMPLANT
GUIDEWIRE ANG ZIPWIRE 038X150 (WIRE) IMPLANT
GUIDEWIRE STR DUAL SENSOR (WIRE) ×3 IMPLANT
MANIFOLD NEPTUNE II (INSTRUMENTS) ×3 IMPLANT
PACK CYSTO (CUSTOM PROCEDURE TRAY) ×3 IMPLANT
SCRUB PCMX 4 OZ (MISCELLANEOUS) ×3 IMPLANT
STENT CONTOUR 6FRX26X.038 (STENTS) ×2 IMPLANT
STENT PERCUFLEX 4.8FRX26 (STENTS) ×2 IMPLANT
TUBING CONNECTING 10 (TUBING) ×3 IMPLANT

## 2012-08-19 NOTE — Preoperative (Signed)
Beta Blockers   Reason not to administer Beta Blockers:Not Applicable Pt took Beta Blocker today 08-19-12

## 2012-08-19 NOTE — Op Note (Signed)
Urology Operative Report  Date of Procedure: 08/19/12  Surgeon: Natalia Leatherwood, MD Assistant:  None  Preoperative Diagnosis: Right ureter stone Postoperative Diagnosis:  Same  Procedure(s): Cystoscopy Right ureter stent placement. Right retrograde pyelogram  Estimated blood loss: None  Specimen: None  Drains: None  Complications: None  Findings: Stone visible on fluoroscopy in the right ureter. No definite filling defect in the right ureter, but there was an area in the mid ureter where the stone had been located which was very faint on retrograde which I believe to be the stone.  History of present illness: Patient presented to the clinic today after being seen yesterday in clinic with pain. The pain was worsening and it was from a right-sided ureter stone. He elected to proceed with right ureter stent placement today.   Procedure in detail: After informed consent was obtained, the patient was taken to the operating room. They were placed in the supine position. SCDs were turned on and in place. IV antibiotics were infused, and general anesthesia was induced. A timeout was performed in which the correct patient, surgical site, and procedure were identified and agreed upon by the team.  The patient was placed in a dorsolithotomy position, making sure to pad all pertinent neurovascular pressure points. The genitals were prepped and draped in the usual sterile fashion.  A rigid cystoscope was advanced through the urethra and into the bladder. The bladder was drained and then fully distended and evaluated in a systematic fashion so the entire surface of the bladder was visualized. This was negative for tumors. Attention was turned to the right ureter orifice. Fluoroscopy was performed and I was not able to visualize any stone in the right ureter. I then cannulated the right ureter orifice with a 5 [place] ureter catheter and injected contrast to obtain a retrograde pyelogram. I did not  see any definite ureter filling defect, but the area where the stone had been previously located in the mid ureter did show a complaint area of possible filling defect. I believe this was the stone. There was a small stone in the bladder which was 1 mm in size was not consistent with the stone seen on KUB. There was return of a large amount of debris. The right side did not drain the contrast from the kidney well and therefore elected to place a ureter stent as I believe the stone was still in place. I placed a sensor tip wire up the right ureter orifice into the right renal pelvis on fluoroscopy. A 4.8 x 24 double-J stent without strings was placed over the wire and deployed with a good curl in the right renal pelvis and a good curl in the bladder. The bladder was then drained, I injected 10 cc of lidocaine jelly into the urethra, and then I placed a belladonna and opium suppository into his rectum.  Anesthesia was reversed and he was taken to the [facility] in stable condition.  All counts were correct at the end of the case.  I recommend proceeding with ureteroscopy to definitively treat his stone as I do not believe it would be very well visualized on KUB for shockwave lithotripsy.

## 2012-08-19 NOTE — H&P (Signed)
Urology History and Physical Exam  CC: Right ureter stone.  HPI:  62 year old male presents today with a history of nephrolithiasis. He recently started passing a stone on the right side. He has uric acid stones. KUB yesterday, 08/18/12, revealed a calcification in the right mid to proximal ureter above the transverse process of L4. This was approximately 4-5 mm in size. He has a history of stage III chronic renal disease. He returns today denying fever. He has had some nausea. He states his pain is very difficult to control. Nothing seems to make the stone better or worse. He A. KUB today which is difficult to visualize the stone due to overlying bowel gas. He is referred to results section for interpretation.  I advise placing a right ureter stent with cystoscopy and possible right retrograde pyelogram today to help relieve his pain. We discussed the risks, benefits, and side effects.  Today we discussed the management of urinary stones. These options include observation, ureteroscopy, shockwave lithotripsy, and PCNL. We discussed which options are relevant to these particular stones.  PMH: Past Medical History  Diagnosis Date  . Diabetes mellitus   . Hypertension   . Hyperlipemia   . Arthritis   . Gout   . Angina   . Coronary artery disease   . Kidney stone     "close to 100 since age 72"  . Chronic daily headache     "@ least every other day"    PSH: Past Surgical History  Procedure Laterality Date  . Lithotripsy      "many; probably 5 times"  . Kidney stone surgery      "probably 3 times"  . Cardiac catheterization  04/27/11  . Cataract extraction w/ intraocular lens implant  1990's    right; "lens was replaced twice over 3 months; lens is actually over iris"  . Retinal detachment surgery  1990's    right eye; "made me have an early cataract"  . Bone spur  1990's    right great toe; "probably related to gout"  . Coronary artery bypass graft  04/30/2011    Procedure:  CORONARY ARTERY BYPASS GRAFTING (CABG);  Surgeon: Loreli Slot, MD;  Location: [facility] OR;  Service: Open Heart Surgery;  Laterality: N/A;  . Radial artery harvest  04/30/2011    Procedure: RADIAL ARTERY HARVEST;  Surgeon: Loreli Slot, MD;  Location: [facility] OR;  Service: Open Heart Surgery;  Laterality: Left;    Allergies: Allergies  Allergen Reactions  . Gabapentin Other (See Comments)    "made me so dizzy I missed work for 2 days"  . Metformin And Related Shortness Of Breath  . Sulfa Antibiotics Shortness Of Breath and Rash  . Doxycycline Rash    Medications: No prescriptions prior to admission     Social History: History   Social History  . Marital Status: Married    Spouse Name: N/A    Number of Children: N/A  . Years of Education: N/A   Occupational History  . Not on file.   Social History Main Topics  . Smoking status: Never Smoker   . Smokeless tobacco: Never Used  . Alcohol Use: Yes     Comment: 04/27/11 "2 mixed drinks plus 2  beers/month"  . Drug Use: Yes    Special: Marijuana     Comment: last marijuana use ~ 2010; "very rare"  . Sexually Active: Yes   Other Topics Concern  . 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          Family History: No family history on file.  Review of Systems: Positive: Nausea, right flank pain. Negative: Fever, chest pain, or SOB.  A further 10 point review of systems was negative except what is listed in the HPI.  Physical Exam: Filed Vitals:   08/19/12 1221  BP: 148/94  Pulse: 75  Temp: 98.8 F (37.1 C)  Resp: 18    General: No acute distress.  Awake. Head:  Normocephalic.  Atraumatic. ENT:  EOMI.  Mucous membranes moist Neck:  Supple.  No lymphadenopathy. CV:  S1 present. S2 present. Regular rate. Pulmonary: Equal effort bilaterally.  Clear to auscultation bilaterally. Abdomen: Soft.  Non- tender to palpation. Skin:  Normal turgor.   No visible rash. Extremity: No gross deformity of bilateral upper extremities.  No gross deformity of    bilateral lower extremities. Neurologic: Alert. Appropriate mood.    Studies:  No results found for this basename: HGB, WBC, PLT,  in the last 72 hours  No results found for this basename: NA, K, CL, CO2, BUN, CREATININE, CALCIUM, MAGNESIUM, GFRNONAA, GFRAA,  in the last 72 hours   No results found for this basename: PT, INR, APTT,  in the last 72 hours   No components found with this basename: ABG,     Assessment:  Right ureter stone.  Plan: To OR for cystoscopy, right ureter stent placement, possible right retrograde pyelogram.

## 2012-08-19 NOTE — Anesthesia Preprocedure Evaluation (Addendum)
Anesthesia Evaluation  Patient identified by MRN, date of birth, ID band Patient awake    Reviewed: Allergy & Precautions, H&P , NPO status , Patient's Chart, lab work & pertinent test results  Airway Mallampati: II TM Distance: >3 FB Neck ROM: full    Dental no notable dental hx. (+) Teeth Intact and Dental Advisory Given   Pulmonary neg pulmonary ROS,  breath sounds clear to auscultation  Pulmonary exam normal       Cardiovascular Exercise Tolerance: Good hypertension, Pt. on home beta blockers + CAD and + CABG Rhythm:regular Rate:Normal  CABG 2013.  No CP since   Neuro/Psych negative neurological ROS  negative psych ROS   GI/Hepatic negative GI ROS, Neg liver ROS,   Endo/Other  diabetes, Well Controlled, Type 2, Insulin Dependent  Renal/GU negative Renal ROS  negative genitourinary   Musculoskeletal   Abdominal   Peds  Hematology negative hematology ROS (+)   Anesthesia Other Findings   Reproductive/Obstetrics negative OB ROS                         Anesthesia Physical Anesthesia Plan  ASA: III  Anesthesia Plan: General   Post-op Pain Management:    Induction: Intravenous  Airway Management Planned: LMA  Additional Equipment:   Intra-op Plan:   Post-operative Plan:   Informed Consent: I have reviewed the patients History and Physical, chart, labs and discussed the procedure including the risks, benefits and alternatives for the proposed anesthesia with the patient or authorized representative who has indicated his/her understanding and acceptance.   Dental Advisory Given  Plan Discussed with: CRNA and Surgeon  Anesthesia Plan Comments:         Anesthesia Quick Evaluation

## 2012-08-19 NOTE — Anesthesia Postprocedure Evaluation (Signed)
  Anesthesia Post-op Note  Patient: Mitchell Lambert  Procedure(s) Performed: Procedure(s) (LRB): CYSTOSCOPY WITH RETROGRADE PYELOGRAM/URETERAL STENT PLACEMENT (Right)  Patient Location: PACU  Anesthesia Type: General  Level of Consciousness: awake and alert   Airway and Oxygen Therapy: Patient Spontanous Breathing  Post-op Pain: mild  Post-op Assessment: Post-op Vital signs reviewed, Patient's Cardiovascular Status Stable, Respiratory Function Stable, Patent Airway and No signs of Nausea or vomiting  Last Vitals:  Filed Vitals:   08/19/12 1515  BP: 124/72  Pulse: 62  Temp:   Resp: 15    Post-op Vital Signs: stable   Complications: No apparent anesthesia complications

## 2012-08-19 NOTE — Transfer of Care (Signed)
Immediate Anesthesia Transfer of Care Note  Patient: Mitchell Lambert  Procedure(s) Performed: Procedure(s) (LRB): CYSTOSCOPY WITH RETROGRADE PYELOGRAM/URETERAL STENT PLACEMENT (Right)  Patient Location: PACU  Anesthesia Type: General  Level of Consciousness: sedated, patient cooperative and responds to stimulaton  Airway & Oxygen Therapy: Patient Spontanous Breathing and Patient connected to face mask oxgen  Post-op Assessment: Report given to PACU RN and Post -op Vital signs reviewed and stable  Post vital signs: Reviewed and stable  Complications: No apparent anesthesia complications

## 2012-08-22 ENCOUNTER — Encounter (HOSPITAL_COMMUNITY): Payer: Self-pay | Admitting: Urology

## 2012-08-22 LAB — GLUCOSE, CAPILLARY: Glucose-Capillary: 158 mg/dL — ABNORMAL HIGH (ref 70–99)

## 2012-09-06 ENCOUNTER — Other Ambulatory Visit: Payer: Self-pay | Admitting: Urology

## 2012-09-07 ENCOUNTER — Encounter (HOSPITAL_COMMUNITY): Payer: Self-pay | Admitting: Pharmacy Technician

## 2012-09-08 ENCOUNTER — Encounter (HOSPITAL_COMMUNITY): Payer: Self-pay

## 2012-09-08 ENCOUNTER — Encounter (HOSPITAL_COMMUNITY)
Admission: RE | Admit: 2012-09-08 | Discharge: 2012-09-08 | Disposition: A | Discharge status: Home / Self Care | Payer: BC Managed Care – PPO | Source: Ambulatory Visit | Attending: Urology | Admitting: Urology

## 2012-09-08 DIAGNOSIS — Z87442 Personal history of urinary calculi: Secondary | ICD-10-CM

## 2012-09-08 HISTORY — DX: Personal history of urinary calculi: Z87.442

## 2012-09-08 LAB — BASIC METABOLIC PANEL
BUN: 24 mg/dL — ABNORMAL HIGH (ref 6–23)
CO2: 28 mEq/L (ref 19–32)
Calcium: 9.8 mg/dL (ref 8.4–10.5)
Chloride: 104 mEq/L (ref 96–112)
Creatinine, Ser: 1.19 mg/dL (ref 0.50–1.35)
GFR calc Af Amer: 74 mL/min — ABNORMAL LOW (ref >90)
GFR calc non Af Amer: 64 mL/min — ABNORMAL LOW (ref >90)
Glucose, Bld: 179 mg/dL — ABNORMAL HIGH (ref 70–99)
Potassium: 5 mEq/L (ref 3.5–5.1)
Sodium: 141 mEq/L (ref 135–145)

## 2012-09-08 LAB — CBC
HCT: 43.3 % (ref 39.0–52.0)
Hemoglobin: 15 g/dL (ref 13.0–17.0)
MCH: 30.4 pg (ref 26.0–34.0)
MCHC: 34.6 g/dL (ref 30.0–36.0)
MCV: 87.8 fL (ref 78.0–100.0)
Platelets: 210 10*3/uL (ref 150–400)
RBC: 4.93 MIL/uL (ref 4.22–5.81)
RDW: 12.4 % (ref 11.5–15.5)
WBC: 4.7 10*3/uL (ref 4.0–10.5)

## 2012-09-08 NOTE — Patient Instructions (Addendum)
20 Mitchell Lambert  09/08/2012   Your procedure is scheduled on:  7-14 -2014  Report to [facility] at     [phone]   AM.  Call this number if you have problems the morning of surgery: [phone]  Or Presurgical Testing [phone](Ericka Marcellus)      Do not eat food:After Midnight.   Take these medicines the morning of surgery with A SIP OF WATER: Allopurinol. Hydrocodone. Oxycodone. Simvastatin. No Lantus insulin AM of or other Diabetic  meds.   Do not wear jewelry, make-up or nail polish.  Do not wear lotions, powders, or perfumes. You may wear deodorant.  Do not shave 12 hours prior to first CHG shower(legs and under arms).(face and neck okay.)  Do not bring valuables to the hospital.  Contacts, dentures or bridgework,body piercing,  may not be worn into surgery.  Leave suitcase in the car. After surgery it may be brought to your room.  For patients admitted to the hospital, checkout time is 11:00 AM the day of discharge.   Patients discharged the day of surgery will not be allowed to drive home. Must have responsible person with you x 24 hours once discharged.  Name and phone number of your driver: Sunil Hue [phone] cell  Special Instructions: CHG(Chlorhedine 4%-"Hibiclens","Betasept","Aplicare") Shower Use Special Wash: see special instructions.(avoid face and genitals)      Failure to follow these instructions may result in Cancellation of your surgery.   Patient signature_______________________________________________________

## 2012-09-08 NOTE — Pre-Procedure Instructions (Signed)
09-08-12 EKG 7'14-report with chart/ CXR 6'14-Epic.

## 2012-09-12 ENCOUNTER — Ambulatory Visit (HOSPITAL_COMMUNITY): Payer: BC Managed Care – PPO | Admitting: *Deleted

## 2012-09-12 ENCOUNTER — Encounter (HOSPITAL_COMMUNITY): Payer: Self-pay | Admitting: *Deleted

## 2012-09-12 ENCOUNTER — Ambulatory Visit (HOSPITAL_COMMUNITY)
Admission: RE | Admit: 2012-09-12 | Discharge: 2012-09-12 | Disposition: A | Discharge status: Home / Self Care | Payer: BC Managed Care – PPO | Source: Ambulatory Visit | Attending: Urology | Admitting: Urology

## 2012-09-12 ENCOUNTER — Encounter (HOSPITAL_COMMUNITY)
Admission: RE | Disposition: A | Discharge status: Home / Self Care | Payer: Self-pay | Source: Ambulatory Visit | Attending: Urology

## 2012-09-12 ENCOUNTER — Encounter (HOSPITAL_COMMUNITY): Payer: Self-pay

## 2012-09-12 DIAGNOSIS — I2510 Atherosclerotic heart disease of native coronary artery without angina pectoris: Secondary | ICD-10-CM | POA: Insufficient documentation

## 2012-09-12 DIAGNOSIS — Z7982 Long term (current) use of aspirin: Secondary | ICD-10-CM | POA: Insufficient documentation

## 2012-09-12 DIAGNOSIS — M109 Gout, unspecified: Secondary | ICD-10-CM | POA: Insufficient documentation

## 2012-09-12 DIAGNOSIS — Z951 Presence of aortocoronary bypass graft: Secondary | ICD-10-CM | POA: Insufficient documentation

## 2012-09-12 DIAGNOSIS — E119 Type 2 diabetes mellitus without complications: Secondary | ICD-10-CM | POA: Insufficient documentation

## 2012-09-12 DIAGNOSIS — N200 Calculus of kidney: Secondary | ICD-10-CM | POA: Insufficient documentation

## 2012-09-12 DIAGNOSIS — Z79899 Other long term (current) drug therapy: Secondary | ICD-10-CM | POA: Insufficient documentation

## 2012-09-12 DIAGNOSIS — I10 Essential (primary) hypertension: Secondary | ICD-10-CM | POA: Insufficient documentation

## 2012-09-12 DIAGNOSIS — E785 Hyperlipidemia, unspecified: Secondary | ICD-10-CM | POA: Insufficient documentation

## 2012-09-12 DIAGNOSIS — Z794 Long term (current) use of insulin: Secondary | ICD-10-CM | POA: Insufficient documentation

## 2012-09-12 DIAGNOSIS — Z01812 Encounter for preprocedural laboratory examination: Secondary | ICD-10-CM | POA: Insufficient documentation

## 2012-09-12 HISTORY — PX: HOLMIUM LASER APPLICATION: SHX5852

## 2012-09-12 HISTORY — PX: CYSTOSCOPY/RETROGRADE/URETEROSCOPY: SHX5316

## 2012-09-12 LAB — GLUCOSE, CAPILLARY
Glucose-Capillary: 160 mg/dL — ABNORMAL HIGH (ref 70–99)
Glucose-Capillary: 138 mg/dL — ABNORMAL HIGH (ref 70–99)

## 2012-09-12 SURGERY — CYSTOSCOPY/RETROGRADE/URETEROSCOPY
Anesthesia: General | Laterality: Right

## 2012-09-12 MED ORDER — EPHEDRINE SULFATE 50 MG/ML IJ SOLN
INTRAMUSCULAR | Status: DC | PRN
  Administered 2012-09-12: 5 mg via INTRAVENOUS

## 2012-09-12 MED ORDER — NEOSTIGMINE METHYLSULFATE 1 MG/ML IJ SOLN
INTRAMUSCULAR | Status: DC | PRN
  Administered 2012-09-12: 2 mg via INTRAVENOUS

## 2012-09-12 MED ORDER — LIDOCAINE HCL 4 % MT SOLN
OROMUCOSAL | Status: DC | PRN
  Administered 2012-09-12: 4 mL via TOPICAL

## 2012-09-12 MED ORDER — GLYCOPYRROLATE 0.2 MG/ML IJ SOLN
INTRAMUSCULAR | Status: DC | PRN
  Administered 2012-09-12: .6 mg via INTRAVENOUS

## 2012-09-12 MED ORDER — SUCCINYLCHOLINE CHLORIDE 20 MG/ML IJ SOLN
INTRAMUSCULAR | Status: DC | PRN
  Administered 2012-09-12: 100 mg via INTRAVENOUS

## 2012-09-12 MED ORDER — LIDOCAINE HCL (CARDIAC) 20 MG/ML IV SOLN
INTRAVENOUS | Status: DC | PRN
  Administered 2012-09-12: 75 mg via INTRAVENOUS

## 2012-09-12 MED ORDER — ONDANSETRON HCL 4 MG/2ML IJ SOLN
INTRAMUSCULAR | Status: DC | PRN
  Administered 2012-09-12 (×2): 2 mg via INTRAVENOUS

## 2012-09-12 MED ORDER — FENTANYL CITRATE 0.05 MG/ML IJ SOLN
INTRAMUSCULAR | Status: DC | PRN
  Administered 2012-09-12 (×4): 50 ug via INTRAVENOUS

## 2012-09-12 MED ORDER — CIPROFLOXACIN IN D5W 400 MG/200ML IV SOLN
INTRAVENOUS | Status: AC
  Filled 2012-09-12: qty 200

## 2012-09-12 MED ORDER — SODIUM CHLORIDE 0.9 % IR SOLN
Status: DC | PRN
  Administered 2012-09-12: 3000 mL via INTRAVESICAL

## 2012-09-12 MED ORDER — PROMETHAZINE HCL 25 MG/ML IJ SOLN: 6.2500 mg | INTRAMUSCULAR | Status: DC | PRN

## 2012-09-12 MED ORDER — HYDROMORPHONE HCL PF 1 MG/ML IJ SOLN
0.2500 mg | INTRAMUSCULAR | Status: DC | PRN
  Administered 2012-09-12 (×2): 0.5 mg via INTRAVENOUS

## 2012-09-12 MED ORDER — LACTATED RINGERS IV SOLN
INTRAVENOUS | Status: DC | PRN
  Administered 2012-09-12 (×2): via INTRAVENOUS

## 2012-09-12 MED ORDER — MEPERIDINE HCL 50 MG/ML IJ SOLN: 6.2500 mg | INTRAMUSCULAR | Status: DC | PRN

## 2012-09-12 MED ORDER — CIPROFLOXACIN HCL 250 MG PO TABS: 250.0000 mg | ORAL_TABLET | Freq: Two times a day (BID) | ORAL | Status: DC

## 2012-09-12 MED ORDER — IOHEXOL 300 MG/ML  SOLN
INTRAMUSCULAR | Status: AC
  Filled 2012-09-12: qty 1

## 2012-09-12 MED ORDER — KETOROLAC TROMETHAMINE 30 MG/ML IJ SOLN
30.0000 mg | Freq: Once | INTRAMUSCULAR | Status: AC
  Administered 2012-09-12: 30 mg via INTRAVENOUS

## 2012-09-12 MED ORDER — KETOROLAC TROMETHAMINE 30 MG/ML IJ SOLN
INTRAMUSCULAR | Status: AC
  Filled 2012-09-12: qty 1

## 2012-09-12 MED ORDER — HYDROMORPHONE HCL PF 1 MG/ML IJ SOLN
INTRAMUSCULAR | Status: AC
  Filled 2012-09-12: qty 1

## 2012-09-12 MED ORDER — CIPROFLOXACIN IN D5W 400 MG/200ML IV SOLN
400.0000 mg | INTRAVENOUS | Status: AC
  Administered 2012-09-12: 400 mg via INTRAVENOUS

## 2012-09-12 MED ORDER — PROPOFOL 10 MG/ML IV BOLUS
INTRAVENOUS | Status: DC | PRN
  Administered 2012-09-12: 25 mg via INTRAVENOUS
  Administered 2012-09-12: 175 mg via INTRAVENOUS

## 2012-09-12 MED ORDER — CISATRACURIUM BESYLATE (PF) 10 MG/5ML IV SOLN
INTRAVENOUS | Status: DC | PRN
  Administered 2012-09-12: 2 mg via INTRAVENOUS
  Administered 2012-09-12: 4 mg via INTRAVENOUS

## 2012-09-12 MED ORDER — MIDAZOLAM HCL 5 MG/5ML IJ SOLN
INTRAMUSCULAR | Status: DC | PRN
  Administered 2012-09-12 (×2): 1 mg via INTRAVENOUS

## 2012-09-12 SURGICAL SUPPLY — 24 items
ADAPTER CATH URET PLST 4-6FR (CATHETERS) ×2 IMPLANT
ADPR CATH URET STRL DISP 4-6FR (CATHETERS) ×1
BAG URO CATCHER STRL LF (DRAPE) ×2 IMPLANT
BASKET ZERO TIP NITINOL 2.4FR (BASKET) ×1 IMPLANT
BSKT STON RTRVL ZERO TP 2.4FR (BASKET) ×1
CATH INTERMIT  6FR 70CM (CATHETERS) IMPLANT
CATH URET 5FR 28IN CONE TIP (BALLOONS)
CATH URET 5FR 28IN OPEN ENDED (CATHETERS) ×2 IMPLANT
CATH URET 5FR 70CM CONE TIP (BALLOONS) IMPLANT
CATH URET WHISTLE 5FR 28IN (CATHETERS) IMPLANT
CATH URET WHISTLE 6FR (CATHETERS) IMPLANT
CLOTH BEACON ORANGE TIMEOUT ST (SAFETY) ×2 IMPLANT
DRAPE CAMERA CLOSED 9X96 (DRAPES) ×2 IMPLANT
FIBER LASER TRAC TIP (UROLOGICAL SUPPLIES) ×1 IMPLANT
GLOVE BIOGEL M 8.0 STRL (GLOVE) ×2 IMPLANT
GLOVE SURG SS PI 8.0 STRL IVOR (GLOVE) ×2 IMPLANT
GOWN PREVENTION PLUS XLARGE (GOWN DISPOSABLE) ×2 IMPLANT
GOWN STRL REIN XL XLG (GOWN DISPOSABLE) ×2 IMPLANT
MANIFOLD NEPTUNE II (INSTRUMENTS) ×2 IMPLANT
MARKER SKIN DUAL TIP RULER LAB (MISCELLANEOUS) ×2 IMPLANT
PACK CYSTO (CUSTOM PROCEDURE TRAY) ×2 IMPLANT
SHEATH ACCESS URETERAL 54CM (SHEATH) ×1 IMPLANT
TUBING CONNECTING 10 (TUBING) ×2 IMPLANT
WIRE COONS/BENSON .038X145CM (WIRE) ×2 IMPLANT

## 2012-09-12 NOTE — Anesthesia Postprocedure Evaluation (Signed)
Anesthesia Post Note  Patient: Mitchell Lambert  Procedure(s) Performed: Procedure(s) (LRB): CYSTOSCOPY/RETROGRADE/URETEROSCOPY (Right) HOLMIUM LASER APPLICATION (Right)  Anesthesia type: General  Patient location: PACU  Post pain: Pain level controlled  Post assessment: Post-op Vital signs reviewed  Last Vitals: BP 132/70  Pulse 57  Temp(Src) 36.3 C (Oral)  Resp 16  SpO2 99%  Post vital signs: Reviewed  Level of consciousness: sedated  Complications: No apparent anesthesia complications

## 2012-09-12 NOTE — Preoperative (Signed)
Beta Blockers   Reason not to administer Beta Blockers :Not Applicable took beta blocker this a.m. 

## 2012-09-12 NOTE — Anesthesia Procedure Notes (Signed)
Procedure Name: Intubation Date/Time: 09/12/2012 7:55 AM Performed by: Edison Pace Pre-anesthesia Checklist: Patient identified, Timeout performed, Emergency Drugs available, Suction available and Patient being monitored Patient Re-evaluated:Patient Re-evaluated prior to inductionOxygen Delivery Method: Circle system utilized Preoxygenation: Pre-oxygenation with 100% oxygen Intubation Type: IV induction and Cricoid Pressure applied Ventilation: Mask ventilation without difficulty Laryngoscope Size: Mac and 4 Grade View: Grade II Tube type: Oral Tube size: 8.0 mm Number of attempts: 1 Airway Equipment and Method: Stylet Placement Confirmation: ETT inserted through vocal cords under direct vision,  positive ETCO2 and breath sounds checked- equal and bilateral Secured at: 21 cm Tube secured with: Tape Dental Injury: Teeth and Oropharynx as per pre-operative assessment

## 2012-09-12 NOTE — Anesthesia Preprocedure Evaluation (Addendum)
Anesthesia Evaluation  Patient identified by MRN, date of birth, ID band Patient awake    Reviewed: Allergy & Precautions, H&P , NPO status , Patient's Chart, lab work & pertinent test results, reviewed documented beta blocker date and time   Airway Mallampati: II TM Distance: >3 FB Neck ROM: full    Dental no notable dental hx. (+) Teeth Intact and Dental Advisory Given   Pulmonary neg pulmonary ROS,  breath sounds clear to auscultation  Pulmonary exam normal       Cardiovascular Exercise Tolerance: Good hypertension, Pt. on home beta blockers - angina+ CAD and + CABG Rhythm:regular Rate:Normal  CABG 2013.  No CP since   Neuro/Psych  Headaches, negative psych ROS   GI/Hepatic negative GI ROS, Neg liver ROS,   Endo/Other  diabetes, Well Controlled, Type 2, Insulin Dependent  Renal/GU negative Renal ROS     Musculoskeletal   Abdominal   Peds  Hematology negative hematology ROS (+)   Anesthesia Other Findings   Reproductive/Obstetrics                          Anesthesia Physical  Anesthesia Plan  ASA: III  Anesthesia Plan: General   Post-op Pain Management:    Induction: Intravenous  Airway Management Planned: Oral ETT  Additional Equipment:   Intra-op Plan:   Post-operative Plan: Extubation in OR  Informed Consent: I have reviewed the patients History and Physical, chart, labs and discussed the procedure including the risks, benefits and alternatives for the proposed anesthesia with the patient or authorized representative who has indicated his/her understanding and acceptance.   Dental advisory given  Plan Discussed with: CRNA  Anesthesia Plan Comments:       Anesthesia Quick Evaluation

## 2012-09-12 NOTE — Op Note (Signed)
Preoperative diagnosis: Right renal stone  Postoperative diagnosis: Same   Procedure: Cystoscopy, double-J stent extraction, right ureteroscopy with rigid and flexible ureteroscope, holmium laser and extraction of right renal stone    Surgeon: Bertram Millard. Aiyanna Awtrey, M.D.   Anesthesia: Gen.   Complications: None  Specimen(s): Kidney stone, the patient's wife  Drain(s): None  Indications: 62 year-old male with history of uric acid urolithiasis presents at this time for definitive management of a symptomatic right proximal ureteral stone that was stented in June, 2014. Because of the radiolucent character of the stone, ureteroscopy and stone treatment is recommended in he understands the procedure as well as risks and complications. He desires to proceed.    Technique and findings: The patient was properly identified and marked in the holding area and received ciprofloxacin IV. He was taken the operating room where general anesthetic was administered. He was placed in the dorsolithotomy position. Genitalia and perineum were prepped and draped. Proper timeout was performed.  Cystoscopy was then performed. Urethra was normal, prostate was nonobstructive. The bladder was entered and inspected circumferentially. No tumors trabeculations or foreign bodies were noted. The right ureteral orifice had a stent position within it. The stent was grasped and partially extracted, at least with the distal tip through the urethral meatus. I then negotiated a sensor-tip guidewire through the stent up into the right renal pelvis using fluoroscopic guidance. The stent was then removed.  I then passed a rigid ureteroscope proximally within the ureter under direct vision. I could not gain access to the proximal ureter due to the patient's body habitus. The rigid cystoscope was removed, and over top of the guidewire I placed a long digital ureteral access sheath. The digital flexible ureteroscope was advanced through  this. All calyces were inspected. Only one stone was seen in an interpolar calyx. It was quite difficult to grasp due to the medial position of it. However, it was eventually grasped. I was unable to remove through the ureteroscope sheath, and then I used the holmium laser to properly fragment the stone into 2 very small stones with one larger fragment. Larger fragment was grasped and removed with difficulty but through the ureteral access sheath. The smaller stones were quite difficult to snare with the basket due to the smaller size. It was felt that these would pass easily once the patient became ambulatory. The smaller fragments were left in, each was approximately under 1 mm in size. Because of the long-term stenting of the individual, I did not replace a stent. Once the majority of the stone was extracted, the access sheath was removed. The bladder was drained and the procedure terminated. The patient was awakened and taken to the PACU in stable condition. He tolerated the procedure well.

## 2012-09-12 NOTE — H&P (Signed)
Urology History and Physical Exam  CC: Right sided kidney stone  HPI: 62 year old male presents for right sided ureteroscopy with laser and extraction of kidney stones following urgent stenting on 08/19/2012 by Dr. Margarita Grizzle. He presented prior to that with flank pain, renal insufficiency and hyperkalemia which resolved with stenting.  PMH: Past Medical History  Diagnosis Date  . Hypertension   . Hyperlipemia   . Gout   . Angina   . Coronary artery disease   . Diabetes mellitus     Lantus x 10 yrs  . History of kidney stones 09-08-12    multiple times with some lithotripsies  . Chronic daily headache     "@ least every other day""improved since heart surgery"  . Arthritis     right ankle    PSH: Past Surgical History  Procedure Laterality Date  . Lithotripsy      "many; probably 5 times"  . Kidney stone surgery      "probably 3 times"  . Cardiac catheterization  04/27/11  . Cataract extraction w/ intraocular lens implant  1990's    right; "lens was replaced twice over 3 months; lens is actually over iris"  . Retinal detachment surgery  1990's    right eye; "made me have an early cataract"  . Bone spur  1990's    right great toe; "probably related to gout"  . Radial artery harvest  04/30/2011    Procedure: RADIAL ARTERY HARVEST;  Surgeon: Loreli Slot, MD;  Location: [facility] OR;  Service: Open Heart Surgery;  Laterality: Left;  . Cystoscopy w/ ureteral stent placement Right 08/19/2012    Procedure: CYSTOSCOPY WITH RETROGRADE PYELOGRAM/URETERAL STENT PLACEMENT;  Surgeon: Milford Cage, MD;  Location: [facility] ORS;  Service: Urology;  Laterality: Right;  . Coronary artery bypass graft  04/30/2011    Procedure: CORONARY ARTERY BYPASS GRAFTING (CABG);  Surgeon: Loreli Slot, MD;  Location: [facility] OR;  Service: Open Heart Surgery;  Laterality: N/A;,x4 vessels  . Back surgery      microsurgery: spinal stenosis -lumbar  . Vasectomy      Allergies: Allergies  Allergen  Reactions  . Gabapentin Other (See Comments)    "made me so dizzy I missed work for 2 days"  . Metformin And Related Shortness Of Breath  . Sulfa Antibiotics Shortness Of Breath and Rash  . Amoxicillin     rash   . Lisinopril     Cough   . Doxycycline Rash    Medications: Prescriptions prior to admission  Medication Sig Dispense Refill  . allopurinol (ZYLOPRIM) 300 MG tablet Take 300 mg by mouth daily.      Marland Kitchen aspirin EC 81 MG tablet Take 81 mg by mouth daily.      . Biotin 5000 MCG TABS Take 1 tablet by mouth daily.      . Cholecalciferol (VITAMIN D3) 5000 UNITS TABS Take 1 tablet by mouth daily.      . fish oil-omega-3 fatty acids 1000 MG capsule Take 1 g by mouth 2 (two) times daily.      Marland Kitchen glucosamine-chondroitin 500-400 MG tablet Take 2 tablets by mouth daily.      Marland Kitchen HYDROcodone-acetaminophen (NORCO) 10-325 MG per tablet Take 0.5-1 tablets by mouth every 4 (four) hours as needed for pain.      Marland Kitchen insulin glargine (LANTUS) 100 units/mL SOLN Inject 40 Units into the skin every morning.       . magnesium oxide (MAG-OX) 400 MG tablet Take  800 mg by mouth at bedtime.      . metoprolol tartrate (LOPRESSOR) 25 MG tablet Take 25 mg by mouth 2 (two) times daily.      Marland Kitchen oxyCODONE-acetaminophen (ROXICET) 5-325 MG per tablet Take 1-2 tablets by mouth every 4 (four) hours as needed for pain.  50 tablet  0  . phenazopyridine (PYRIDIUM) 100 MG tablet Take 1 tablet (100 mg total) by mouth every 8 (eight) hours as needed for pain (Burning urination.  Will turn urine and body fluids orange.).  30 tablet  1  . simvastatin (ZOCOR) 40 MG tablet Take 40 mg by mouth every morning.      . vitamin C (ASCORBIC ACID) 500 MG tablet Take 1,000 mg by mouth daily.          Social History: History   Social History  . Marital Status: Married    Spouse Name: N/A    Number of Children: N/A  . Years of Education: N/A   Occupational History  . Not on file.   Social History Main Topics  . Smoking status:  Never Smoker   . Smokeless tobacco: Never Used  . Alcohol Use: Yes     Comment: 04/27/11 "2 mixed drinks plus 2  beers/month"  . Drug Use: Yes    Special: Marijuana     Comment: last marijuana use ~ 2010; "very rare". since  . Sexually Active: Yes   Other Topics Concern  . 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          Family History: No family history on file.  Review of Systems: Positive: N/A Negative:   A further 10 point review of systems was negative except what is listed in the HPI.  Physical Exam: @VITALS2 @ General: No acute distress.  Awake. Head:  Normocephalic.  Atraumatic. ENT:  EOMI.  Mucous membranes moist Neck:  Supple.  No lymphadenopathy. CV:  S1 present. S2 present. Regular rate. Pulmonary: Equal effort bilaterally.  Clear to auscultation bilaterally. Abdomen: Soft.  Non tender to palpation. Mildly obese. Skin:  Normal turgor.  No visible rash. Extremity: No gross deformity of bilateral upper extremities.  No gross deformity of    bilateral lower extremities. Neurologic: Alert. Appropriate mood.    Studies:  No results found for this basename: HGB, WBC, PLT,  in the last 72 hours  No results found for this basename: NA, K, CL, CO2, BUN, CREATININE, CALCIUM, MAGNESIUM, GFRNONAA, GFRAA,  in the last 72 hours   No results found for this basename: PT, INR, APTT,  in the last 72 hours   No components found with this basename: ABG,     Assessment:  Right renal calculi  Plan: Right ureteroscopy with laser/extraction of renal/ureteral calculi.

## 2012-09-12 NOTE — Transfer of Care (Signed)
Immediate Anesthesia Transfer of Care Note  Patient: Mitchell Lambert  Procedure(s) Performed: Procedure(s) with comments: CYSTOSCOPY/RETROGRADE/URETEROSCOPY (Right) HOLMIUM LASER APPLICATION (Right) CYSTOSCOPY WITH STENT REPLACEMENT (Right) - RT URETER STENT EXCHANGE  Patient Location: PACU  Anesthesia Type:General  Level of Consciousness: awake, alert , oriented and patient cooperative  Airway & Oxygen Therapy: Patient Spontanous Breathing and Patient connected to face mask oxygen  Post-op Assessment: Report given to PACU RN, Post -op Vital signs reviewed and stable and Patient moving all extremities X 4  Post vital signs: Reviewed and stable  Complications: No apparent anesthesia complications

## 2012-09-13 ENCOUNTER — Encounter (HOSPITAL_COMMUNITY): Payer: Self-pay | Admitting: Urology

## 2013-07-14 ENCOUNTER — Encounter (HOSPITAL_COMMUNITY): Payer: Self-pay | Admitting: *Deleted

## 2013-07-14 ENCOUNTER — Encounter (HOSPITAL_COMMUNITY): Payer: Self-pay | Admitting: Pharmacy Technician

## 2013-07-14 ENCOUNTER — Other Ambulatory Visit: Payer: Self-pay | Admitting: Urology

## 2013-07-14 NOTE — Progress Notes (Signed)
PT'S MOST RECENT EKG AND CARDIOLOGY OFFICE NOTE 08/31/12 ON PT'S CHART FROM DR. TILLEY.

## 2013-07-14 NOTE — Progress Notes (Signed)
PATIENT IS ADD ON FOR SURGERY Monday  07/17/13 - REACHED PT BY PHONE - HE STATES HE IS GOING OUT OF TOWN THIS AFTERNOON - UNABLE TO COME FOR [place] DR. DAHLSTEDT AWARE.  PREOP INSTRUCTIONS AND CHLORHEXIDINE INSTRUCTIONS AND PRECAUTIONS REVIEWED WITH PT.

## 2013-07-17 ENCOUNTER — Ambulatory Visit (HOSPITAL_COMMUNITY): Payer: BC Managed Care – PPO

## 2013-07-17 ENCOUNTER — Ambulatory Visit (HOSPITAL_COMMUNITY): Payer: BC Managed Care – PPO | Admitting: Anesthesiology

## 2013-07-17 ENCOUNTER — Ambulatory Visit (HOSPITAL_COMMUNITY)
Admission: RE | Admit: 2013-07-17 | Discharge: 2013-07-17 | Disposition: A | Discharge status: Home / Self Care | Payer: BC Managed Care – PPO | Source: Ambulatory Visit | Attending: Urology | Admitting: Urology

## 2013-07-17 ENCOUNTER — Encounter (HOSPITAL_COMMUNITY): Payer: Self-pay | Admitting: *Deleted

## 2013-07-17 ENCOUNTER — Encounter (HOSPITAL_COMMUNITY)
Admission: RE | Disposition: A | Discharge status: Home / Self Care | Payer: Self-pay | Source: Ambulatory Visit | Attending: Urology

## 2013-07-17 ENCOUNTER — Encounter (HOSPITAL_COMMUNITY): Payer: BC Managed Care – PPO | Admitting: Anesthesiology

## 2013-07-17 DIAGNOSIS — E785 Hyperlipidemia, unspecified: Secondary | ICD-10-CM | POA: Insufficient documentation

## 2013-07-17 DIAGNOSIS — N200 Calculus of kidney: Secondary | ICD-10-CM | POA: Insufficient documentation

## 2013-07-17 DIAGNOSIS — E119 Type 2 diabetes mellitus without complications: Secondary | ICD-10-CM | POA: Insufficient documentation

## 2013-07-17 DIAGNOSIS — Z794 Long term (current) use of insulin: Secondary | ICD-10-CM | POA: Insufficient documentation

## 2013-07-17 DIAGNOSIS — Z951 Presence of aortocoronary bypass graft: Secondary | ICD-10-CM | POA: Insufficient documentation

## 2013-07-17 DIAGNOSIS — N1330 Unspecified hydronephrosis: Secondary | ICD-10-CM | POA: Insufficient documentation

## 2013-07-17 DIAGNOSIS — M109 Gout, unspecified: Secondary | ICD-10-CM | POA: Insufficient documentation

## 2013-07-17 DIAGNOSIS — I10 Essential (primary) hypertension: Secondary | ICD-10-CM | POA: Insufficient documentation

## 2013-07-17 DIAGNOSIS — I2510 Atherosclerotic heart disease of native coronary artery without angina pectoris: Secondary | ICD-10-CM | POA: Insufficient documentation

## 2013-07-17 HISTORY — PX: HOLMIUM LASER APPLICATION: SHX5852

## 2013-07-17 HISTORY — PX: CYSTOSCOPY WITH RETROGRADE PYELOGRAM, URETEROSCOPY AND STENT PLACEMENT: SHX5789

## 2013-07-17 LAB — GLUCOSE, CAPILLARY
Glucose-Capillary: 91 mg/dL (ref 70–99)
Glucose-Capillary: 108 mg/dL — ABNORMAL HIGH (ref 70–99)

## 2013-07-17 LAB — BASIC METABOLIC PANEL
BUN: 31 mg/dL — ABNORMAL HIGH (ref 6–23)
CO2: 25 mEq/L (ref 19–32)
Calcium: 9.4 mg/dL (ref 8.4–10.5)
Chloride: 105 mEq/L (ref 96–112)
Creatinine, Ser: 1.64 mg/dL — ABNORMAL HIGH (ref 0.50–1.35)
GFR calc Af Amer: 50 mL/min — ABNORMAL LOW (ref >90)
GFR calc non Af Amer: 43 mL/min — ABNORMAL LOW (ref >90)
Glucose, Bld: 95 mg/dL (ref 70–99)
Potassium: 4.3 mEq/L (ref 3.7–5.3)
Sodium: 143 mEq/L (ref 137–147)

## 2013-07-17 LAB — CBC
HCT: 39.4 % (ref 39.0–52.0)
Hemoglobin: 13.8 g/dL (ref 13.0–17.0)
MCH: 31 pg (ref 26.0–34.0)
MCHC: 35 g/dL (ref 30.0–36.0)
MCV: 88.5 fL (ref 78.0–100.0)
Platelets: 237 10*3/uL (ref 150–400)
RBC: 4.45 MIL/uL (ref 4.22–5.81)
RDW: 12.2 % (ref 11.5–15.5)
WBC: 6.7 10*3/uL (ref 4.0–10.5)

## 2013-07-17 SURGERY — CYSTOURETEROSCOPY, WITH RETROGRADE PYELOGRAM AND STENT INSERTION
Anesthesia: General | Laterality: Left

## 2013-07-17 MED ORDER — FENTANYL CITRATE 0.05 MG/ML IJ SOLN
INTRAMUSCULAR | Status: DC | PRN
  Administered 2013-07-17 (×8): 25 ug via INTRAVENOUS
  Administered 2013-07-17: 50 ug via INTRAVENOUS

## 2013-07-17 MED ORDER — LACTATED RINGERS IV SOLN: INTRAVENOUS | Status: DC

## 2013-07-17 MED ORDER — MIDAZOLAM HCL 5 MG/5ML IJ SOLN
INTRAMUSCULAR | Status: DC | PRN
  Administered 2013-07-17: 2 mg via INTRAVENOUS

## 2013-07-17 MED ORDER — SODIUM CHLORIDE 0.9 % IV SOLN: 250.0000 mL | INTRAVENOUS | Status: DC | PRN

## 2013-07-17 MED ORDER — FENTANYL CITRATE 0.05 MG/ML IJ SOLN: 25.0000 ug | INTRAMUSCULAR | Status: DC | PRN

## 2013-07-17 MED ORDER — ONDANSETRON HCL 4 MG/2ML IJ SOLN
INTRAMUSCULAR | Status: AC
  Filled 2013-07-17: qty 2

## 2013-07-17 MED ORDER — IOHEXOL 300 MG/ML  SOLN
INTRAMUSCULAR | Status: DC | PRN
  Administered 2013-07-17: 10 mL

## 2013-07-17 MED ORDER — OXYCODONE HCL 5 MG PO TABS: 5.0000 mg | ORAL_TABLET | ORAL | Status: DC | PRN

## 2013-07-17 MED ORDER — FENTANYL CITRATE 0.05 MG/ML IJ SOLN
INTRAMUSCULAR | Status: AC
  Filled 2013-07-17: qty 5

## 2013-07-17 MED ORDER — SODIUM CHLORIDE 0.9 % IJ SOLN
INTRAMUSCULAR | Status: AC
  Filled 2013-07-17: qty 10

## 2013-07-17 MED ORDER — 0.9 % SODIUM CHLORIDE (POUR BTL) OPTIME
TOPICAL | Status: DC | PRN
  Administered 2013-07-17: 1000 mL

## 2013-07-17 MED ORDER — CIPROFLOXACIN HCL 250 MG PO TABS: 250.0000 mg | ORAL_TABLET | Freq: Two times a day (BID) | ORAL | Status: DC

## 2013-07-17 MED ORDER — PROPOFOL 10 MG/ML IV BOLUS
INTRAVENOUS | Status: AC
  Filled 2013-07-17: qty 20

## 2013-07-17 MED ORDER — EPHEDRINE SULFATE 50 MG/ML IJ SOLN
INTRAMUSCULAR | Status: DC | PRN
  Administered 2013-07-17: 5 mg via INTRAVENOUS

## 2013-07-17 MED ORDER — ONDANSETRON HCL 4 MG/2ML IJ SOLN
INTRAMUSCULAR | Status: DC | PRN
  Administered 2013-07-17: 4 mg via INTRAVENOUS

## 2013-07-17 MED ORDER — SODIUM CHLORIDE 0.9 % IJ SOLN: 3.0000 mL | Freq: Two times a day (BID) | INTRAMUSCULAR | Status: DC

## 2013-07-17 MED ORDER — KETOROLAC TROMETHAMINE 30 MG/ML IJ SOLN: 30.0000 mg | Freq: Four times a day (QID) | INTRAMUSCULAR | Status: DC

## 2013-07-17 MED ORDER — ACETAMINOPHEN 325 MG PO TABS: 650.0000 mg | ORAL_TABLET | ORAL | Status: DC | PRN

## 2013-07-17 MED ORDER — LACTATED RINGERS IV SOLN
INTRAVENOUS | Status: DC | PRN
  Administered 2013-07-17 (×2): via INTRAVENOUS

## 2013-07-17 MED ORDER — MIDAZOLAM HCL 2 MG/2ML IJ SOLN
INTRAMUSCULAR | Status: AC
  Filled 2013-07-17: qty 2

## 2013-07-17 MED ORDER — DEXAMETHASONE SODIUM PHOSPHATE 10 MG/ML IJ SOLN
INTRAMUSCULAR | Status: DC | PRN
  Administered 2013-07-17: 10 mg via INTRAVENOUS

## 2013-07-17 MED ORDER — PROPOFOL 10 MG/ML IV BOLUS
INTRAVENOUS | Status: DC | PRN
  Administered 2013-07-17: 150 mg via INTRAVENOUS

## 2013-07-17 MED ORDER — SODIUM CHLORIDE 0.9 % IR SOLN
Status: DC | PRN
  Administered 2013-07-17: 2000 mL

## 2013-07-17 MED ORDER — ACETAMINOPHEN 650 MG RE SUPP
650.0000 mg | RECTAL | Status: DC | PRN
  Filled 2013-07-17: qty 1

## 2013-07-17 MED ORDER — PROMETHAZINE HCL 25 MG/ML IJ SOLN
6.2500 mg | INTRAMUSCULAR | Status: DC | PRN
Start: 2013-07-17 — End: 2013-07-17

## 2013-07-17 MED ORDER — CIPROFLOXACIN IN D5W 400 MG/200ML IV SOLN
400.0000 mg | INTRAVENOUS | Status: AC
  Administered 2013-07-17: 400 mg via INTRAVENOUS

## 2013-07-17 MED ORDER — EPHEDRINE SULFATE 50 MG/ML IJ SOLN
INTRAMUSCULAR | Status: AC
  Filled 2013-07-17: qty 1

## 2013-07-17 MED ORDER — KETOROLAC TROMETHAMINE 30 MG/ML IJ SOLN: 15.0000 mg | Freq: Once | INTRAMUSCULAR | Status: DC | PRN

## 2013-07-17 MED ORDER — OXYBUTYNIN CHLORIDE 5 MG PO TABS: 5.0000 mg | ORAL_TABLET | Freq: Three times a day (TID) | ORAL | Status: DC

## 2013-07-17 MED ORDER — DEXAMETHASONE SODIUM PHOSPHATE 10 MG/ML IJ SOLN
INTRAMUSCULAR | Status: AC
  Filled 2013-07-17: qty 1

## 2013-07-17 MED ORDER — SODIUM CHLORIDE 0.9 % IJ SOLN: 3.0000 mL | INTRAMUSCULAR | Status: DC | PRN

## 2013-07-17 MED ORDER — SODIUM CHLORIDE 0.9 % IR SOLN
Status: DC | PRN
  Administered 2013-07-17: 3000 mL

## 2013-07-17 MED ORDER — CIPROFLOXACIN IN D5W 400 MG/200ML IV SOLN
INTRAVENOUS | Status: AC
  Filled 2013-07-17: qty 200

## 2013-07-17 SURGICAL SUPPLY — 20 items
BAG URO CATCHER STRL LF (DRAPE) ×2 IMPLANT
BASKET ZERO TIP NITINOL 2.4FR (BASKET) ×1 IMPLANT
BSKT STON RTRVL ZERO TP 2.4FR (BASKET) ×1
CATH INTERMIT  6FR 70CM (CATHETERS) ×2 IMPLANT
CLOTH BEACON ORANGE TIMEOUT ST (SAFETY) ×2 IMPLANT
DRAPE CAMERA CLOSED 9X96 (DRAPES) ×2 IMPLANT
FIBER LASER FLEXIVA 200 (UROLOGICAL SUPPLIES) ×2 IMPLANT
FIBER LASER FLEXIVA 365 (UROLOGICAL SUPPLIES) IMPLANT
GLOVE BIOGEL M 8.0 STRL (GLOVE) ×2 IMPLANT
GOWN STRL REUS W/TWL XL LVL3 (GOWN DISPOSABLE) ×1 IMPLANT
GUIDEWIRE AMPLATZ STIFF 0.35 (WIRE) ×1 IMPLANT
GUIDEWIRE STR DUAL SENSOR (WIRE) ×2 IMPLANT
MANIFOLD NEPTUNE II (INSTRUMENTS) ×2 IMPLANT
PACK CYSTO (CUSTOM PROCEDURE TRAY) ×2 IMPLANT
SHEATH ACCESS URETERAL 38CM (SHEATH) ×10 IMPLANT
SHEATH ACCESS URETERAL 54CM (SHEATH) ×1 IMPLANT
SHEATH URET ACCESS 12FR/35CM (UROLOGICAL SUPPLIES) ×1 IMPLANT
STENT CONTOUR 6FRX24X.038 (STENTS) ×1 IMPLANT
TUBING CONNECTING 10 (TUBING) ×2 IMPLANT
WIRE COONS/BENSON .038X145CM (WIRE) IMPLANT

## 2013-07-17 NOTE — Transfer of Care (Signed)
Immediate Anesthesia Transfer of Care Note  Patient: Mitchell Lambert  Procedure(s) Performed: Procedure(s): CYSTOSCOPY, LEFT URETEROSCOPY, BASKET EXTRACTION OF STONE, INSERTION OF LEFT URETERAL STENT,  (Left) HOLMIUM LASER APPLICATION (Left)  Patient Location: PACU  Anesthesia Type:General  Level of Consciousness: awake and patient cooperative  Airway & Oxygen Therapy: Patient Spontanous Breathing and Patient connected to face mask oxygen  Post-op Assessment: Report given to PACU RN and Post -op Vital signs reviewed and stable  Post vital signs: Reviewed and stable  Complications: No apparent anesthesia complications

## 2013-07-17 NOTE — Op Note (Signed)
PATIENT:  Gilmore Laroche  PRE-OPERATIVE DIAGNOSIS: Left midureteral stone  POST-OPERATIVE DIAGNOSIS: Same, multiple left renal calculi  PROCEDURE: Cystoscopy, left retrograde ureteropyelogram with interpretation of fluoroscopy, rigid and flexible left ureteroscopy, holmium laser fragmentation of multiple stones, extraction of several small stones Edmunds, left double J stent placement  SURGEON:  Lillette Boxer. Shemica Meath, M.D.  ANESTHESIA:  General  EBL:  Minimal  DRAINS: 24 x 6 French contour stent without string  LOCAL MEDICATIONS USED:  None  SPECIMEN:  Stone fragments the patient's wife  INDICATION: TERRIL CHESTNUT is a 63 year old male who presents at this time for ureteroscopic management of a left midureteral stone.  Description of procedure: The patient was properly identified and marked (if applicable) in the holding area. They were then  taken to the operating room and placed on the table in a supine position. General anesthesia was then administered. Once fully anesthetized the patient was moved to the dorsolithotomy position and the genitalia and perineum were sterilely prepped and draped in standard fashion. An official timeout was then performed.   A 22 French panendoscope was advanced under direct vision through his urethra this was normal. Prostate was nonobstructive. Bladder was inspected circumferentially. No tumors trabeculations or foreign bodies were noted. The left ureteral orifice was cannulated with an open-ended catheter. Retrograde ureteropyelogram was performed.  This revealed left ureteral filling defect in the mid ureter. There was proximal hydronephrosis.  At this point, a sensor-tip guidewire was advanced to the ureter up into the renal pelvis where good curl was seen. The cystoscope was removed. The left distal ureter was dilated with a ureteral access sheath. I then negotiated the rigid ureteroscope with difficulty up to the ureteral stone or a need to use a  superstiff guidewire to straighten out the ureter. Despite this, it was still difficult to identify the stone. At this point I realized that to perform flexible ureteroscopy. I passed a digital ureteroscope sheath. At this point it was evident that the stones and washed up into the kidney. Once the sheath was in place, I passed a digital ureteroscope and found the stone in the mid calyx. This was fragmented with a 200  holmium laser fiber and the smaller fragments. It was evident that there were couple of other larger stones that could not be removed through the sheath. I also fragmented these into multiple smaller fragments. 2 large stones were identified in the lower pole calyx. These were fragmented into multiple smaller fragments. As the patient makes uric acid stones, and it was difficult to remove all of the stone fragments, especially once in the lower pole, it was felt best to leave a stent in for eventual chemolysis. Several smaller fragments were removed, there were multiple small stones remaining that I thought would easily pass once the stent was removed. I then passed a 24 cm x 6 French contour stent, taking the string of. Good proximal and distal curls were seen following removal of the guidewire. I then drained the bladder and removed the scope. The patient tolerated procedure well. His awakened and taken to PACU in stable condition.   PLAN OF CARE: Discharge to home after PACU  PATIENT DISPOSITION:  PACU - hemodynamically stable.

## 2013-07-17 NOTE — Anesthesia Preprocedure Evaluation (Addendum)
Anesthesia Evaluation  Patient identified by MRN, date of birth, ID band Patient awake    Reviewed: Allergy & Precautions, H&P , NPO status , Patient's Chart, lab work & pertinent test results  Airway Mallampati: II TM Distance: >3 FB Neck ROM: Full    Dental no notable dental hx.    Pulmonary neg pulmonary ROS,  breath sounds clear to auscultation  Pulmonary exam normal       Cardiovascular hypertension, Pt. on home beta blockers + CAD and + CABG Rhythm:Regular Rate:Normal     Neuro/Psych negative neurological ROS  negative psych ROS   GI/Hepatic negative GI ROS, Neg liver ROS,   Endo/Other  diabetes, Type 2, Insulin Dependent  Renal/GU negative Renal ROS  negative genitourinary   Musculoskeletal negative musculoskeletal ROS (+)   Abdominal   Peds negative pediatric ROS (+)  Hematology negative hematology ROS (+)   Anesthesia Other Findings   Reproductive/Obstetrics negative OB ROS                          Anesthesia Physical Anesthesia Plan  ASA: III  Anesthesia Plan: General   Post-op Pain Management:    Induction: Intravenous  Airway Management Planned: LMA  Additional Equipment:   Intra-op Plan:   Post-operative Plan:   Informed Consent: I have reviewed the patients History and Physical, chart, labs and discussed the procedure including the risks, benefits and alternatives for the proposed anesthesia with the patient or authorized representative who has indicated his/her understanding and acceptance.   Dental advisory given  Plan Discussed with: CRNA and Surgeon  Anesthesia Plan Comments:        Anesthesia Quick Evaluation

## 2013-07-17 NOTE — Anesthesia Postprocedure Evaluation (Signed)
  Anesthesia Post-op Note  Patient: Mitchell Lambert  Procedure(s) Performed: Procedure(s) (LRB): CYSTOSCOPY, LEFT URETEROSCOPY, BASKET EXTRACTION OF STONE, INSERTION OF LEFT URETERAL STENT,  (Left) HOLMIUM LASER APPLICATION (Left)  Patient Location: PACU  Anesthesia Type: General  Level of Consciousness: awake and alert   Airway and Oxygen Therapy: Patient Spontanous Breathing  Post-op Pain: mild  Post-op Assessment: Post-op Vital signs reviewed, Patient's Cardiovascular Status Stable, Respiratory Function Stable, Patent Airway and No signs of Nausea or vomiting  Last Vitals:  Filed Vitals:   07/17/13 0904  BP: 160/71  Pulse: 80  Temp: 36.3 C  Resp: 16    Post-op Vital Signs: stable   Complications: No apparent anesthesia complications

## 2013-07-17 NOTE — H&P (Signed)
Urology History and Physical Exam  CC: Kidney stone  HPI: 63 year old male presents for ureteroscopic management of a left mid ureteral stone that has been symptomatic for over a week. It   PMH: Past Medical History  Diagnosis Date  . Hypertension   . Hyperlipemia   . Gout   . Diabetes mellitus     Lantus x 10 yrs  . History of kidney stones 09-08-12    multiple times with some lithotripsies  . Chronic daily headache     "@ least every other day""improved since heart surgery"  . Arthritis     right ankle  . Angina     NO ANGINA SINCE CABG  . Coronary artery disease     PT STATES HEART DOING WELL SINCE CABG SURGERY - DR. TILLEY IS HIS CARDIOLOGIST    PSH: Past Surgical History  Procedure Laterality Date  . Lithotripsy      "many; probably 5 times"  . Kidney stone surgery      "probably 3 times"  . Cardiac catheterization  04/27/11  . Cataract extraction w/ intraocular lens implant  1990's    right; "lens was replaced twice over 3 months; lens is actually over iris"  . Retinal detachment surgery  1990's    right eye; "made me have an early cataract"  . Bone spur  1990's    right great toe; "probably related to gout"  . Radial artery harvest  04/30/2011    Procedure: RADIAL ARTERY HARVEST;  Surgeon: Melrose Nakayama, MD;  Location: [place];  Service: Open Heart Surgery;  Laterality: Left;  . Cystoscopy w/ ureteral stent placement Right 08/19/2012    Procedure: CYSTOSCOPY WITH RETROGRADE PYELOGRAM/URETERAL STENT PLACEMENT;  Surgeon: Molli Hazard, MD;  Location: [facility];  Service: Urology;  Laterality: Right;  . Coronary artery bypass graft  04/30/2011    Procedure: CORONARY ARTERY BYPASS GRAFTING (CABG);  Surgeon: Melrose Nakayama, MD;  Location: [place];  Service: Open Heart Surgery;  Laterality: N/A;,x4 vessels  . Back surgery      microsurgery: spinal stenosis -lumbar  . Vasectomy    . Cystoscopy/retrograde/ureteroscopy Right 09/12/2012    Procedure:  CYSTOSCOPY/RETROGRADE/URETEROSCOPY;  Surgeon: Franchot Gallo, MD;  Location: [facility];  Service: Urology;  Laterality: Right;  . Holmium laser application Right 05/15/1759    Procedure: HOLMIUM LASER APPLICATION;  Surgeon: Franchot Gallo, MD;  Location: [facility];  Service: Urology;  Laterality: Right;    Allergies: Allergies  Allergen Reactions  . Gabapentin Other (See Comments)    "made me so dizzy I missed work for 2 days"  . Metformin And Related Shortness Of Breath  . Sulfa Antibiotics Shortness Of Breath and Rash  . Amoxicillin     rash   . Lisinopril     Cough   . Doxycycline Rash    Medications: Prescriptions prior to admission  Medication Sig Dispense Refill  . allopurinol (ZYLOPRIM) 300 MG tablet Take 300 mg by mouth daily.      Marland Kitchen aspirin EC 81 MG tablet Take 81 mg by mouth every morning.       . Biotin 5000 MCG TABS Take 5,000 mcg by mouth daily.       . Cholecalciferol (VITAMIN D3) 5000 UNITS TABS Take 5,000 Units by mouth daily.       . fish oil-omega-3 fatty acids 1000 MG capsule Take 1 g by mouth 2 (two) times daily.      Marland Kitchen glucosamine-chondroitin 500-400 MG tablet Take  2 tablets by mouth daily.      Marland Kitchen HYDROcodone-acetaminophen (NORCO) 10-325 MG per tablet Take 0.5-1 tablets by mouth every 4 (four) hours as needed for pain.      Marland Kitchen ibuprofen (ADVIL,MOTRIN) 200 MG tablet Take 400 mg by mouth every 6 (six) hours as needed for moderate pain.      Marland Kitchen insulin glargine (LANTUS) 100 units/mL SOLN Inject 40 Units into the skin every morning.       . magnesium oxide (MAG-OX) 400 MG tablet Take 400 mg by mouth 2 (two) times daily.       . metoprolol tartrate (LOPRESSOR) 25 MG tablet Take 25 mg by mouth 2 (two) times daily.      . simvastatin (ZOCOR) 40 MG tablet Take 40 mg by mouth every morning.      . vitamin C (ASCORBIC ACID) 500 MG tablet Take 1,000 mg by mouth daily.       Marland Kitchen VITAMIN E PO Take 1 capsule by mouth daily.         Social History: History   Social  History  . Marital Status: Married    Spouse Name: N/A    Number of Children: N/A  . Years of Education: N/A   Occupational History  . Not on file.   Social History Main Topics  . Smoking status: Never Smoker   . Smokeless tobacco: Never Used  . Alcohol Use: Yes     Comment:  OCCAS BEER  OR MIXED DRINK LAST MARIJUANA COUPLE MONTHS AGO  . Drug Use: Yes    Special: Marijuana     Comment: last marijuana use ~ 2010; "very rare". since  . Sexual Activity: Yes   Other Topics Concern  . 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          Family History: History reviewed. No pertinent family history.  Review of Systems: Positive: Left flank pain Negative:   A further 10 point review of systems was negative except what is listed in the HPI.                  Physical Exam: @VITALS2 @ General: No acute distress.  Awake. Head:  Normocephalic.  Atraumatic. ENT:  EOMI.  Mucous membranes moist Neck:  Supple.  No lymphadenopathy. CV:  S1 present. S2 present. Regular rate. Pulmonary: Equal effort bilaterally.  Clear to auscultation bilaterally. Abdomen: Soft.  Non tender to palpation. Skin:  Normal turgor.  No visible rash. Extremity: No gross deformity of bilateral upper extremities.  No gross deformity of                             lower extremities. Neurologic: Alert. Appropriate mood.    Studies:  No results found for this basename: HGB, WBC, PLT,  in the last 72 hours  No results found for this basename: NA, K, CL, CO2, BUN, CREATININE, CALCIUM, MAGNESIUM, GFRNONAA, GFRAA,  in the last 72 hours   No results found for this basename: PT, INR, APTT,  in the last 72 hours   No components found with this basename: ABG,     Assessment:  Left mid ureteral stone  Plan: Anesthetic cystoscopy, left ureteroscopic stone management with laser, possible stent

## 2013-07-17 NOTE — Discharge Instructions (Signed)

## 2013-07-18 ENCOUNTER — Encounter (HOSPITAL_COMMUNITY): Payer: Self-pay | Admitting: Urology

## 2013-11-29 ENCOUNTER — Encounter: Payer: Self-pay | Admitting: Cardiology

## 2013-11-29 ENCOUNTER — Ambulatory Visit
Admission: RE | Admit: 2013-11-29 | Discharge: 2013-11-29 | Disposition: A | Discharge status: Home / Self Care | Payer: BC Managed Care – PPO | Source: Ambulatory Visit | Attending: Cardiology | Admitting: Cardiology

## 2013-11-29 ENCOUNTER — Other Ambulatory Visit: Payer: Self-pay | Admitting: Cardiology

## 2013-11-29 DIAGNOSIS — I2510 Atherosclerotic heart disease of native coronary artery without angina pectoris: Secondary | ICD-10-CM

## 2013-11-29 DIAGNOSIS — Z951 Presence of aortocoronary bypass graft: Secondary | ICD-10-CM | POA: Insufficient documentation

## 2014-02-08 ENCOUNTER — Encounter (HOSPITAL_COMMUNITY): Payer: Self-pay | Admitting: Cardiology

## 2014-04-27 ENCOUNTER — Emergency Department (HOSPITAL_COMMUNITY)
Admission: EM | Admit: 2014-04-27 | Discharge: 2014-04-27 | Disposition: A | Discharge status: Home / Self Care | Payer: Self-pay | Attending: Emergency Medicine | Admitting: Emergency Medicine

## 2014-04-27 ENCOUNTER — Encounter (HOSPITAL_COMMUNITY): Payer: Self-pay

## 2014-04-27 DIAGNOSIS — Z9889 Other specified postprocedural states: Secondary | ICD-10-CM | POA: Insufficient documentation

## 2014-04-27 DIAGNOSIS — Z794 Long term (current) use of insulin: Secondary | ICD-10-CM | POA: Insufficient documentation

## 2014-04-27 DIAGNOSIS — M109 Gout, unspecified: Secondary | ICD-10-CM | POA: Insufficient documentation

## 2014-04-27 DIAGNOSIS — I10 Essential (primary) hypertension: Secondary | ICD-10-CM | POA: Insufficient documentation

## 2014-04-27 DIAGNOSIS — M7989 Other specified soft tissue disorders: Secondary | ICD-10-CM

## 2014-04-27 DIAGNOSIS — Z951 Presence of aortocoronary bypass graft: Secondary | ICD-10-CM | POA: Insufficient documentation

## 2014-04-27 DIAGNOSIS — Z79899 Other long term (current) drug therapy: Secondary | ICD-10-CM | POA: Insufficient documentation

## 2014-04-27 DIAGNOSIS — Z7982 Long term (current) use of aspirin: Secondary | ICD-10-CM | POA: Insufficient documentation

## 2014-04-27 DIAGNOSIS — E785 Hyperlipidemia, unspecified: Secondary | ICD-10-CM | POA: Insufficient documentation

## 2014-04-27 DIAGNOSIS — Z880 Allergy status to penicillin: Secondary | ICD-10-CM | POA: Insufficient documentation

## 2014-04-27 DIAGNOSIS — Z87442 Personal history of urinary calculi: Secondary | ICD-10-CM | POA: Insufficient documentation

## 2014-04-27 DIAGNOSIS — M1990 Unspecified osteoarthritis, unspecified site: Secondary | ICD-10-CM | POA: Insufficient documentation

## 2014-04-27 DIAGNOSIS — I2510 Atherosclerotic heart disease of native coronary artery without angina pectoris: Secondary | ICD-10-CM | POA: Insufficient documentation

## 2014-04-27 DIAGNOSIS — R609 Edema, unspecified: Secondary | ICD-10-CM

## 2014-04-27 DIAGNOSIS — R600 Localized edema: Secondary | ICD-10-CM | POA: Insufficient documentation

## 2014-04-27 DIAGNOSIS — M79609 Pain in unspecified limb: Secondary | ICD-10-CM

## 2014-04-27 DIAGNOSIS — E119 Type 2 diabetes mellitus without complications: Secondary | ICD-10-CM | POA: Insufficient documentation

## 2014-04-27 LAB — BASIC METABOLIC PANEL
Anion gap: 9 (ref 5–15)
BUN: 23 mg/dL (ref 6–23)
CO2: 24 mmol/L (ref 19–32)
Calcium: 8.9 mg/dL (ref 8.4–10.5)
Chloride: 106 mmol/L (ref 96–112)
Creatinine, Ser: 1.28 mg/dL (ref 0.50–1.35)
GFR calc Af Amer: 67 mL/min — ABNORMAL LOW (ref >90)
GFR calc non Af Amer: 58 mL/min — ABNORMAL LOW (ref >90)
Glucose, Bld: 362 mg/dL — ABNORMAL HIGH (ref 70–99)
Potassium: 4.3 mmol/L (ref 3.5–5.1)
Sodium: 139 mmol/L (ref 135–145)

## 2014-04-27 LAB — CBC WITH DIFFERENTIAL/PLATELET
Basophils Absolute: 0 10*3/uL (ref 0.0–0.1)
Basophils Relative: 0 % (ref 0–1)
Eosinophils Absolute: 0.2 10*3/uL (ref 0.0–0.7)
Eosinophils Relative: 3 % (ref 0–5)
HCT: 41.4 % (ref 39.0–52.0)
Hemoglobin: 14 g/dL (ref 13.0–17.0)
Lymphocytes Relative: 28 % (ref 12–46)
Lymphs Abs: 1.5 10*3/uL (ref 0.7–4.0)
MCH: 30.6 pg (ref 26.0–34.0)
MCHC: 33.8 g/dL (ref 30.0–36.0)
MCV: 90.4 fL (ref 78.0–100.0)
Monocytes Absolute: 0.5 10*3/uL (ref 0.1–1.0)
Monocytes Relative: 9 % (ref 3–12)
Neutro Abs: 3.2 10*3/uL (ref 1.7–7.7)
Neutrophils Relative %: 60 % (ref 43–77)
Platelets: 205 10*3/uL (ref 150–400)
RBC: 4.58 MIL/uL (ref 4.22–5.81)
RDW: 12.8 % (ref 11.5–15.5)
WBC: 5.4 10*3/uL (ref 4.0–10.5)

## 2014-04-27 MED ORDER — ENOXAPARIN SODIUM 100 MG/ML ~~LOC~~ SOLN: 100.0000 mg | Freq: Two times a day (BID) | SUBCUTANEOUS | Status: DC

## 2014-04-27 MED ORDER — ENOXAPARIN SODIUM 100 MG/ML ~~LOC~~ SOLN
100.0000 mg | SUBCUTANEOUS | Status: DC
  Filled 2014-04-27: qty 1

## 2014-04-27 NOTE — Progress Notes (Signed)
VASCULAR LAB PRELIMINARY  PRELIMINARY  PRELIMINARY  PRELIMINARY  Left lower extremity venous duplex completed.    Preliminary report:  Left:  No evidence of DVT, superficial thrombosis, or Baker's cyst. Moderate interstitial fluid noted throughout the calf.  Larence Thone, RVS 04/27/2014, 10:35 PM

## 2014-04-27 NOTE — ED Notes (Signed)
Pt presents with c/o left leg pain and swelling. Pt had a torn achilles tendon on the left side around Thanksgiving, did not have surgery but was placed in a boot. Pt reports he was in the boot for approx 8 weeks without walking and has recently started walking. Pt reports he has had physical therapy recently. Pt reports his leg started swelling this week. Pt is a diabetic.

## 2014-04-27 NOTE — ED Notes (Signed)
Venous duplex being done now.

## 2014-04-27 NOTE — ED Notes (Signed)
Ordered to hold lovenox per MD due to awaiting doppler study.

## 2014-04-27 NOTE — ED Provider Notes (Signed)
CSN: [id number]     Arrival date & time 04/27/14  1751 History   First MD Initiated Contact with Patient 04/27/14 1755     Chief Complaint  Patient presents with  . Leg Pain   HPI Patient presents to the emergency room with complaints of left leg pain and swelling. Patient tore his Achilles several months ago. He was treated with a immobilization device for 8 weeks. Patient has started to walk around more recently. He is also started some physical therapy. However in the last couple of days he's noticed increased swelling and tenderness in his left calf. He contacted his orthopedic doctor today who suggested he come to the emergency room to be evaluated for deep venous thrombosis. Denies any prior history of DVT. He denies any trouble with any chest pain or shortness of breath. No fevers or chills. Past Medical History  Diagnosis Date  . Hypertension   . Hyperlipemia   . Gout   . Diabetes mellitus     Lantus x 10 yrs  . History of kidney stones 09-08-12    multiple times with some lithotripsies  . Chronic daily headache     "@ least every other day""improved since heart surgery"  . Arthritis     right ankle  . Angina     NO ANGINA SINCE CABG  . Coronary artery disease     PT STATES HEART DOING WELL SINCE CABG SURGERY - DR. TILLEY IS HIS CARDIOLOGIST   Past Surgical History  Procedure Laterality Date  . Lithotripsy      "many; probably 5 times"  . Kidney stone surgery      "probably 3 times"  . Cardiac catheterization  04/27/11  . Cataract extraction w/ intraocular lens implant  1990's    right; "lens was replaced twice over 3 months; lens is actually over iris"  . Retinal detachment surgery  1990's    right eye; "made me have an early cataract"  . Bone spur  1990's    right great toe; "probably related to gout"  . Radial artery harvest  04/30/2011    Procedure: RADIAL ARTERY HARVEST;  Surgeon: Melrose Nakayama, MD;  Location: [place];  Service: Open Heart Surgery;  Laterality:  Left;  . Cystoscopy w/ ureteral stent placement Right 08/19/2012    Procedure: CYSTOSCOPY WITH RETROGRADE PYELOGRAM/URETERAL STENT PLACEMENT;  Surgeon: Molli Hazard, MD;  Location: [facility];  Service: Urology;  Laterality: Right;  . Coronary artery bypass graft  04/30/2011    Procedure: CORONARY ARTERY BYPASS GRAFTING (CABG);  Surgeon: Melrose Nakayama, MD;  Location: [place];  Service: Open Heart Surgery;  Laterality: N/A;,x4 vessels  . Back surgery      microsurgery: spinal stenosis -lumbar  . Vasectomy    . Cystoscopy/retrograde/ureteroscopy Right 09/12/2012    Procedure: CYSTOSCOPY/RETROGRADE/URETEROSCOPY;  Surgeon: Franchot Gallo, MD;  Location: [facility];  Service: Urology;  Laterality: Right;  . Holmium laser application Right 11/19/1658    Procedure: HOLMIUM LASER APPLICATION;  Surgeon: Franchot Gallo, MD;  Location: [facility];  Service: Urology;  Laterality: Right;  . Cystoscopy with retrograde pyelogram, ureteroscopy and stent placement Left 07/17/2013    Procedure: CYSTOSCOPY, LEFT URETEROSCOPY, BASKET EXTRACTION OF STONE, INSERTION OF LEFT URETERAL STENT, ;  Surgeon: Jorja Loa, MD;  Location: [facility];  Service: Urology;  Laterality: Left;  . Holmium laser application Left 6/00/4599    Procedure: HOLMIUM LASER APPLICATION;  Surgeon: Jorja Loa, MD;  Location: [facility];  Service: Urology;  Laterality: Left;  . Left heart catheterization with coronary angiogram N/A 04/28/2011    Procedure: LEFT HEART CATHETERIZATION WITH CORONARY ANGIOGRAM;  Surgeon: Jacolyn Reedy, MD;  Location: [facility];  Service: Cardiovascular;  Laterality: N/A;   No family history on file. History  Substance Use Topics  . Smoking status: Never Smoker   . Smokeless tobacco: Never Used  . Alcohol Use: Yes     Comment:  OCCAS BEER  OR MIXED DRINK LAST MARIJUANA COUPLE MONTHS AGO    Review of Systems  All other systems reviewed and are negative.     Allergies  Gabapentin;  Metformin and related; Sulfa antibiotics; Amoxicillin; Lisinopril; and Doxycycline  Home Medications   Prior to Admission medications   Medication Sig Start Date End Date Taking? Authorizing Provider  allopurinol (ZYLOPRIM) 300 MG tablet Take 300 mg by mouth daily.   Yes Historical Provider, MD  aspirin EC 81 MG tablet Take 81 mg by mouth every morning.    Yes Historical Provider, MD  atorvastatin (LIPITOR) 40 MG tablet Take 40 mg by mouth daily.   Yes Historical Provider, MD  Biotin 5000 MCG TABS Take 5,000 mcg by mouth daily.    Yes Historical Provider, MD  Cholecalciferol (VITAMIN D3) 5000 UNITS TABS Take 5,000 Units by mouth daily.    Yes Historical Provider, MD  fish oil-omega-3 fatty acids 1000 MG capsule Take 1 g by mouth 2 (two) times daily.   Yes Historical Provider, MD  glucosamine-chondroitin 500-400 MG tablet Take 2 tablets by mouth daily.   Yes Historical Provider, MD  ibuprofen (ADVIL,MOTRIN) 200 MG tablet Take 400 mg by mouth every 8 (eight) hours as needed for moderate pain.    Yes Historical Provider, MD  insulin glargine (LANTUS) 100 units/mL SOLN Inject 50 Units into the skin every morning.    Yes Historical Provider, MD  L-Lysine HCl 1000 MG TABS Take 1 tablet by mouth daily.   Yes Historical Provider, MD  magnesium oxide (MAG-OX) 400 MG tablet Take 400 mg by mouth 2 (two) times daily.    Yes Historical Provider, MD  metoprolol tartrate (LOPRESSOR) 25 MG tablet Take 25 mg by mouth 2 (two) times daily.   Yes Historical Provider, MD  naproxen (NAPROSYN) 500 MG tablet Take 500 mg by mouth daily.   Yes Historical Provider, MD  vitamin C (ASCORBIC ACID) 500 MG tablet Take 1,000 mg by mouth daily.    Yes Historical Provider, MD  VITAMIN E PO Take 1 capsule by mouth daily.   Yes Historical Provider, MD  ciprofloxacin (CIPRO) 250 MG tablet Take 1 tablet (250 mg total) by mouth 2 (two) times daily. Patient not taking: Reported on 04/27/2014 07/17/13   Jorja Loa, MD   HYDROcodone-acetaminophen [facility]) 10-325 MG per tablet Take 0.5-1 tablets by mouth every 4 (four) hours as needed for pain.    Historical Provider, MD  oxybutynin (DITROPAN) 5 MG tablet Take 1 tablet (5 mg total) by mouth 3 (three) times daily. Patient not taking: Reported on 04/27/2014 07/17/13   Jorja Loa, MD  simvastatin (ZOCOR) 40 MG tablet Take 40 mg by mouth every morning.    Historical Provider, MD   BP 148/76 mmHg  Pulse 52  Temp(Src) 98.4 F (36.9 C) (Oral)  Resp 18  Wt 230 lb (104.327 kg)  SpO2 97% Physical Exam  Constitutional: He appears well-developed and well-nourished. No distress.  HENT:  Head: Normocephalic and atraumatic.  Right Ear: External ear normal.  Left Ear:  External ear normal.  Eyes: Conjunctivae are normal. Right eye exhibits no discharge. Left eye exhibits no discharge. No scleral icterus.  Neck: Neck supple. No tracheal deviation present.  Cardiovascular: Normal rate, regular rhythm and intact distal pulses.   Pulmonary/Chest: Effort normal and breath sounds normal. No stridor. No respiratory distress. He has no wheezes. He has no rales.  Abdominal: Soft. Bowel sounds are normal. He exhibits no distension. There is no tenderness. There is no rebound and no guarding.  Musculoskeletal: He exhibits edema and tenderness.       Left lower leg: He exhibits tenderness, swelling and edema. He exhibits no deformity and no laceration.  Neurological: He is alert. He has normal strength. No cranial nerve deficit (no facial droop, extraocular movements intact, no slurred speech) or sensory deficit. He exhibits normal muscle tone. He displays no seizure activity. Coordination normal.  Skin: Skin is warm and dry. No rash noted.  Psychiatric: He has a normal mood and affect.  Nursing note and vitals reviewed.   ED Course  Procedures (including critical care time) Labs Review Labs Reviewed  BASIC METABOLIC PANEL - Abnormal; Notable for the following:     Glucose, Bld 362 (*)    GFR calc non Af Amer 58 (*)    GFR calc Af Amer 67 (*)    All other components within normal limits  CBC WITH DIFFERENTIAL/PLATELET    Vascular ultrasound negative for DVT  MDM   Final diagnoses:  Peripheral edema    No DVT noted on doppler study.  Swelling may be related to his achilles injury.  Discussed wearing his brace.  Keep his leg elevated, rest.    Dorie Rank, MD 04/27/14 2245

## 2014-04-27 NOTE — Discharge Instructions (Signed)
Peripheral Edema °You have swelling in your legs (peripheral edema). This swelling is due to excess accumulation of salt and water in your body. Edema may be a sign of heart, kidney or liver disease, or a side effect of a medication. It may also be due to problems in the leg veins. Elevating your legs and using special support stockings may be very helpful, if the cause of the swelling is due to poor venous circulation. Avoid long periods of standing, whatever the cause. °Treatment of edema depends on identifying the cause. Chips, pretzels, pickles and other salty foods should be avoided. Restricting salt in your diet is almost always needed. Water pills (diuretics) are often used to remove the excess salt and water from your body via urine. These medicines prevent the kidney from reabsorbing sodium. This increases urine flow. °Diuretic treatment may also result in lowering of potassium levels in your body. Potassium supplements may be needed if you have to use diuretics daily. Daily weights can help you keep track of your progress in clearing your edema. You should call your caregiver for follow up care as recommended. °SEEK IMMEDIATE MEDICAL CARE IF:  °· You have increased swelling, pain, redness, or heat in your legs. °· You develop shortness of breath, especially when lying down. °· You develop chest or abdominal pain, weakness, or fainting. °· You have a fever. °Document Released: 03/26/2004 Document Revised: 05/11/2011 Document Reviewed: 03/06/2009 °[employer]® Patient Information ©2015 [employer]. This information is not intended to replace advice given to you by your health care provider. Make sure you discuss any questions you have with your health care provider. ° °

## 2015-03-31 ENCOUNTER — Ambulatory Visit (INDEPENDENT_AMBULATORY_CARE_PROVIDER_SITE_OTHER): Payer: No Typology Code available for payment source | Admitting: Urgent Care

## 2015-03-31 VITALS — BP 140/90 | HR 66 | Temp 98.7°F | Resp 20 | Wt 244.6 lb

## 2015-03-31 DIAGNOSIS — R109 Unspecified abdominal pain: Secondary | ICD-10-CM | POA: Diagnosis not present

## 2015-03-31 DIAGNOSIS — N200 Calculus of kidney: Secondary | ICD-10-CM

## 2015-03-31 DIAGNOSIS — R10A0 Flank pain, unspecified side: Secondary | ICD-10-CM

## 2015-03-31 LAB — POC MICROSCOPIC URINALYSIS (UMFC): Mucus: ABSENT

## 2015-03-31 LAB — POCT URINALYSIS DIP (MANUAL ENTRY)
Bilirubin, UA: NEGATIVE
Blood, UA: NEGATIVE
Glucose, UA: NEGATIVE
Ketones, POC UA: NEGATIVE
Leukocytes, UA: NEGATIVE
Nitrite, UA: NEGATIVE
Protein Ur, POC: NEGATIVE
Spec Grav, UA: 1.025
Urobilinogen, UA: 0.2
pH, UA: 5

## 2015-03-31 MED ORDER — KETOROLAC TROMETHAMINE 60 MG/2ML IM SOLN
60.0000 mg | Freq: Once | INTRAMUSCULAR | Status: AC
  Administered 2015-03-31: 60 mg via INTRAMUSCULAR

## 2015-03-31 MED ORDER — HYDROCODONE-ACETAMINOPHEN 5-325 MG PO TABS: 1.0000 | ORAL_TABLET | Freq: Four times a day (QID) | ORAL | Status: DC | PRN

## 2015-03-31 NOTE — Patient Instructions (Signed)
Renal Colic  Renal colic is pain that is caused by passing a kidney stone. The pain can be sharp and severe. It may be felt in the back, abdomen, side (flank), or groin. It can cause nausea. Renal colic can come and go.  HOME CARE INSTRUCTIONS  Watch your condition for any changes. The following actions may help to lessen any discomfort that you are feeling:  · Take medicines only as directed by your health care provider.  · Ask your health care provider if it is okay to take over-the-counter pain medicine.  · Drink enough fluid to keep your urine clear or pale yellow. Drink 6-8 glasses of water each day.  · Limit the amount of salt that you eat to less than 2 grams per day.  · Reduce the amount of protein in your diet. Eat less meat, fish, nuts, and dairy.  · Avoid foods such as spinach, rhubarb, nuts, or bran. These may make kidney stones more likely to form.  SEEK MEDICAL CARE IF:  · You have a fever or chills.  · Your urine smells bad or looks cloudy.  · You have pain or burning when you pass urine.  SEEK IMMEDIATE MEDICAL CARE IF:  · Your flank pain or groin pain suddenly worsens.  · You become confused or disoriented or you lose consciousness.     This information is not intended to replace advice given to you by your health care provider. Make sure you discuss any questions you have with your health care provider.     Document Released: 11/26/2004 Document Revised: 03/09/2014 Document Reviewed: 12/27/2013  Elsevier Interactive Patient Education ©2016 Elsevier Inc.

## 2015-03-31 NOTE — Progress Notes (Signed)
MRN: [id number] DOB: 08-Apr-1950  Subjective:   Mitchell Lambert is a 65 y.o. male presenting for chief complaint of Nephrolithiasis  Reports several year history of passing renal stones. He is currently having flank pain which is very similar to his previous episodes of renal stones. He denies fever, dysuria, hematuria, cloudy urine, n/v, abdominal pain. He has undergone multiple procedures to remove renal stones. Patient is seen frequently by Dr. Diona Fanti, has given him scripts for ketorolac which provides patient with significant relief. He plans on setting up an appointment for this week.  Eri has a current medication list which includes the following prescription(s): allopurinol, aspirin ec, atorvastatin, biotin, vitamin d3, fish oil-omega-3 fatty acids, glucosamine-chondroitin, hydrocodone-acetaminophen, ibuprofen, insulin glargine, l-lysine hcl, magnesium oxide, metoprolol tartrate, naproxen, vitamin c, vitamin e, ciprofloxacin, oxybutynin, and simvastatin. Also is allergic to gabapentin; metformin and related; sulfa antibiotics; amoxicillin; lisinopril; and doxycycline.  [name]  has a past medical history of Hypertension; Hyperlipemia; Gout; Diabetes mellitus; History of kidney stones (09-08-12); Chronic daily headache; Arthritis; Angina; and Coronary artery disease. Also  has past surgical history that includes Lithotripsy; Kidney stone surgery; Cardiac catheterization (04/27/11); Cataract extraction w/ intraocular lens implant (1990's); Retinal detachment surgery (1990's); bone spur (1990's); Radial artery harvest (04/30/2011); Cystoscopy w/ ureteral stent placement (Right, 08/19/2012); Coronary artery bypass graft (04/30/2011); Back surgery; Vasectomy; Cystoscopy/retrograde/ureteroscopy (Right, 09/12/2012); Holmium laser application (Right, [id number]); Cystoscopy with retrograde pyelogram, ureteroscopy and stent placement (Left, 07/17/2013); Holmium laser application (Left, 99991111); and left  heart catheterization with coronary angiogram (N/A, 04/28/2011).  Objective:   Vitals: BP 140/90 mmHg  Pulse 66  Temp(Src) 98.7 F (37.1 C) (Oral)  Resp 20  Wt 244 lb 9.6 oz (110.95 kg)  SpO2 94%  Physical Exam  Constitutional: He is oriented to person, place, and time. He appears well-developed and well-nourished.  Cardiovascular: Normal rate, regular rhythm and intact distal pulses.  Exam reveals no gallop and no friction rub.   No murmur heard. Pulmonary/Chest: No respiratory distress. He has no wheezes. He has no rales.  Abdominal: Soft. Bowel sounds are normal. He exhibits no distension and no mass. There is no tenderness.  No CVA tenderness.  Neurological: He is alert and oriented to person, place, and time.  Skin: Skin is warm and dry.   Results for orders placed or performed in visit on 03/31/15 (from the past 24 hour(s))  POCT urinalysis dipstick     Status: None   Collection Time: 03/31/15  5:23 PM  Result Value Ref Range   Color, UA yellow yellow   Clarity, UA clear clear   Glucose, UA negative negative   Bilirubin, UA negative negative   Ketones, POC UA negative negative   Spec Grav, UA 1.025    Blood, UA negative negative   pH, UA 5.0    Protein Ur, POC negative negative   Urobilinogen, UA 0.2    Nitrite, UA Negative Negative   Leukocytes, UA Negative Negative  POCT Microscopic Urinalysis ([facility])     Status: None   Collection Time: 03/31/15  5:25 PM  Result Value Ref Range   WBC,UR,HPF,POC None None WBC/hpf   RBC,UR,HPF,POC None None RBC/hpf   Bacteria None None, Too numerous to count   Mucus Absent Absent   Epithelial Cells, UR Per Microscopy None None, Too numerous to count cells/hpf    Assessment and Plan :   1. Flank pain 2. Nephrolithiasis - Patient insisted on having Toradol injection despite having history of decreased GFR. I explained risks  of using this medication like this but patient insisted. He plans on setting up an appointment with Dr.  Diona Fanti for f/u. In the meantime he will try hydrocodone for his pain. Labs pending.   Jaynee Eagles, PA-C [facility] [phone] 03/31/2015 4:08 PM

## 2015-04-01 ENCOUNTER — Encounter: Payer: Self-pay | Admitting: Urgent Care

## 2015-04-01 LAB — COMPLETE METABOLIC PANEL WITH GFR
ALT: 19 U/L (ref 9–46)
AST: 13 U/L (ref 10–35)
Albumin: 3.7 g/dL (ref 3.6–5.1)
Alkaline Phosphatase: 141 U/L — ABNORMAL HIGH (ref 40–115)
BUN: 26 mg/dL — ABNORMAL HIGH (ref 7–25)
CO2: 26 mmol/L (ref 20–31)
Calcium: 9.3 mg/dL (ref 8.6–10.3)
Chloride: 106 mmol/L (ref 98–110)
Creat: 1.25 mg/dL (ref 0.70–1.25)
GFR, Est African American: 69 mL/min (ref >=60)
GFR, Est Non African American: 60 mL/min (ref >=60)
Glucose, Bld: 243 mg/dL — ABNORMAL HIGH (ref 65–99)
Potassium: 4.5 mmol/L (ref 3.5–5.3)
Sodium: 139 mmol/L (ref 135–146)
Total Bilirubin: 1 mg/dL (ref 0.2–1.2)
Total Protein: 6.3 g/dL (ref 6.1–8.1)

## 2016-04-28 DIAGNOSIS — G5600 Carpal tunnel syndrome, unspecified upper limb: Secondary | ICD-10-CM | POA: Diagnosis not present

## 2016-04-28 DIAGNOSIS — Z794 Long term (current) use of insulin: Secondary | ICD-10-CM | POA: Diagnosis not present

## 2016-04-28 DIAGNOSIS — F419 Anxiety disorder, unspecified: Secondary | ICD-10-CM | POA: Diagnosis not present

## 2016-04-28 DIAGNOSIS — E1165 Type 2 diabetes mellitus with hyperglycemia: Secondary | ICD-10-CM | POA: Diagnosis not present

## 2016-04-28 DIAGNOSIS — I10 Essential (primary) hypertension: Secondary | ICD-10-CM | POA: Diagnosis not present

## 2016-04-28 DIAGNOSIS — K219 Gastro-esophageal reflux disease without esophagitis: Secondary | ICD-10-CM | POA: Diagnosis not present

## 2016-06-24 DIAGNOSIS — M79645 Pain in left finger(s): Secondary | ICD-10-CM | POA: Diagnosis not present

## 2016-06-24 DIAGNOSIS — E1165 Type 2 diabetes mellitus with hyperglycemia: Secondary | ICD-10-CM | POA: Diagnosis not present

## 2016-06-24 DIAGNOSIS — Z794 Long term (current) use of insulin: Secondary | ICD-10-CM | POA: Diagnosis not present

## 2016-06-24 DIAGNOSIS — I10 Essential (primary) hypertension: Secondary | ICD-10-CM | POA: Diagnosis not present

## 2016-08-17 DIAGNOSIS — F419 Anxiety disorder, unspecified: Secondary | ICD-10-CM | POA: Diagnosis not present

## 2016-08-17 DIAGNOSIS — R0789 Other chest pain: Secondary | ICD-10-CM | POA: Diagnosis not present

## 2016-08-17 DIAGNOSIS — H9190 Unspecified hearing loss, unspecified ear: Secondary | ICD-10-CM | POA: Diagnosis not present

## 2016-08-18 ENCOUNTER — Telehealth: Payer: Self-pay

## 2016-08-18 NOTE — Telephone Encounter (Signed)
SENT NOTES TO SCHEDULING 

## 2016-09-25 DIAGNOSIS — D229 Melanocytic nevi, unspecified: Secondary | ICD-10-CM | POA: Diagnosis not present

## 2016-09-25 DIAGNOSIS — L814 Other melanin hyperpigmentation: Secondary | ICD-10-CM | POA: Diagnosis not present

## 2016-09-25 DIAGNOSIS — H02739 Vitiligo of unspecified eye, unspecified eyelid and periocular area: Secondary | ICD-10-CM | POA: Diagnosis not present

## 2016-11-18 DIAGNOSIS — Z961 Presence of intraocular lens: Secondary | ICD-10-CM | POA: Diagnosis not present

## 2016-11-18 DIAGNOSIS — H43313 Vitreous membranes and strands, bilateral: Secondary | ICD-10-CM | POA: Diagnosis not present

## 2016-11-18 DIAGNOSIS — H5213 Myopia, bilateral: Secondary | ICD-10-CM | POA: Diagnosis not present

## 2017-02-25 DIAGNOSIS — E1010 Type 1 diabetes mellitus with ketoacidosis without coma: Secondary | ICD-10-CM | POA: Diagnosis not present

## 2017-02-25 DIAGNOSIS — H43399 Other vitreous opacities, unspecified eye: Secondary | ICD-10-CM | POA: Diagnosis not present

## 2017-04-12 DIAGNOSIS — Z23 Encounter for immunization: Secondary | ICD-10-CM | POA: Diagnosis not present

## 2017-04-12 DIAGNOSIS — E119 Type 2 diabetes mellitus without complications: Secondary | ICD-10-CM | POA: Diagnosis not present

## 2017-04-12 DIAGNOSIS — S61021S Laceration with foreign body of right thumb without damage to nail, sequela: Secondary | ICD-10-CM | POA: Diagnosis not present

## 2017-04-29 NOTE — Progress Notes (Signed)
[facility] Note  05/03/2017     CHIEF COMPLAINT Patient presents for Retina Evaluation   HISTORY OF PRESENT ILLNESS: Mitchell Lambert is a 67 y.o. male who presents to the clinic today for:   HPI    Retina Evaluation    In both eyes.  This started 2 weeks ago.  Associated Symptoms Flashes and Floaters.  Negative for Blind Spot, Photophobia, Scalp Tenderness, Fever, Pain, Glare, Jaw Claudication, Weight Loss, Distortion, Redness, Trauma, Shoulder/Hip pain and Fatigue.  Context:  distance vision, mid-range vision and near vision.  Treatments tried include no treatments.  Response to treatment was no improvement.  I, the attending physician,  performed the HPI with the patient and updated documentation appropriately.          Comments    Pt presents today for retinal evaluation on the referral of Dr. Agapito Games, pt has been having irritation OD for the past couple of weeks, pt has been experiencing fog, floaters OS, pt has had previous retinal detachment sx OD performed by Dr. Zigmund Daniel, after that pt had cat sx preformed by Dr. Dolores Lory, pt states lens had to be placed closer to iris than normal which makes images brighter and 30% larger OD than OS, pt states he has appt with Dr. Tommy Rainwater at the end of this month to have cat sx OS, pt states he has headaches every day, pt states he has had "lightening streaks" OS and sees one prominent floater OS as well that seems to come from the top, pt has been using Prednisolone since 04/29/16, pt is diabetic, cannot remember last A1C, pts BS was 110 last night, pt takes several different vitamins,        Last edited by Bernarda Caffey, MD on 05/03/2017  8:26 AM. (History)    Pt states he saw Dr. Marvel Plan last week for flashing OS; Pt states 5-6 weeks ago he felt like we was looking through a fog OS; Pt states 1-2 weeks ago he saw a "light flash" OS; Pt states he had retinal detachment sx with Dr. Zigmund Daniel in the 90s OD (gas  bubble placed and laser retinopexy), states Eps then did a cataract sx, reports he had 2 failed cataract sx OD, reports the last cataract sx IOL was placed "in front of iris"; Pt states he became concerned about a detachment in OS; Pt states he is seeing a type of string floater, pt states since seeing initial flash he no longer noticed any additional flashes; Pt reports he has been suffering from head cold for the past few weeks; Pt states he has an appointment with Dr. Talbert Forest for possible cataract sx OS;   Referring physician: Agapito Games [place], [place] [address]  HISTORICAL INFORMATION:   Selected notes from the MEDICAL RECORD NUMBER Referred by Dr. Gerarda Fraction for concern of new onset floaters/flashes OS;  LEE- 12.27.18 (W. Marvel Plan) [BCVA OD: 20/40 OS: 20/50+2] Ocular Hx- cataract OS, S/P RD OD PMH- DM    CURRENT MEDICATIONS: No current outpatient medications on file. (Ophthalmic Drugs)   No current facility-administered medications for this visit.  (Ophthalmic Drugs)   Current Outpatient Medications (Other)  Medication Sig  . bisoprolol (ZEBETA) 5 MG tablet TK 1 T PO QD  . ACCU-CHEK GUIDE test strip USE TO TEST BLOOD SUGAR 2 TO 3 TIMES A DAY  . allopurinol (ZYLOPRIM) 300 MG tablet Take 300 mg by mouth daily.  Marland Kitchen aspirin EC 81 MG tablet Take  81 mg by mouth every morning.   Marland Kitchen atorvastatin (LIPITOR) 40 MG tablet Take 40 mg by mouth daily.  . B-D ULTRAFINE III SHORT PEN 31G X 8 MM MISC USE UTD WITH FLEXPEN.  . Biotin 5000 MCG TABS Take 5,000 mcg by mouth daily.   . Cholecalciferol (VITAMIN D3) 5000 UNITS TABS Take 5,000 Units by mouth daily.   . ciprofloxacin (CIPRO) 250 MG tablet Take 1 tablet (250 mg total) by mouth 2 (two) times daily. (Patient not taking: Reported on 04/27/2014)  . fish oil-omega-3 fatty acids 1000 MG capsule Take 1 g by mouth 2 (two) times daily.  Marland Kitchen glucosamine-chondroitin 500-400 MG tablet Take 2 tablets by mouth daily.  Marland Kitchen  HYDROcodone-acetaminophen (NORCO) 5-325 MG tablet Take 1 tablet by mouth every 6 (six) hours as needed.  Marland Kitchen ibuprofen (ADVIL,MOTRIN) 200 MG tablet Take 400 mg by mouth every 8 (eight) hours as needed for moderate pain.   Marland Kitchen insulin glargine (LANTUS) 100 units/mL SOLN Inject 50 Units into the skin every morning.   Marland Kitchen L-Lysine HCl 1000 MG TABS Take 1 tablet by mouth daily.  . magnesium oxide (MAG-OX) 400 MG tablet Take 400 mg by mouth 2 (two) times daily.   . metoprolol tartrate (LOPRESSOR) 25 MG tablet Take 25 mg by mouth 2 (two) times daily.  . naproxen (NAPROSYN) 500 MG tablet Take 500 mg by mouth daily.  Marland Kitchen NOVOLOG FLEXPEN 100 UNIT/ML FlexPen INJECT 6 TO 12 UNITS INTO THE SKIN UTD BEFORE A MEAL  . oxybutynin (DITROPAN) 5 MG tablet Take 1 tablet (5 mg total) by mouth 3 (three) times daily. (Patient not taking: Reported on 04/27/2014)  . simvastatin (ZOCOR) 40 MG tablet Take 40 mg by mouth every morning. Reported on 03/31/2015  . vitamin C (ASCORBIC ACID) 500 MG tablet Take 1,000 mg by mouth daily.   Marland Kitchen VITAMIN E PO Take 1 capsule by mouth daily.   No current facility-administered medications for this visit.  (Other)      REVIEW OF SYSTEMS: ROS    Positive for: Constitutional, Musculoskeletal, Endocrine, Cardiovascular, Eyes   Negative for: Gastrointestinal, Neurological, Skin, Genitourinary, HENT, Respiratory, Psychiatric, Allergic/Imm, Heme/Lymph   Last edited by Debbrah Alar, COT on 05/03/2017  8:22 AM. (History)       ALLERGIES Allergies  Allergen Reactions  . Gabapentin Other (See Comments)    "made me so dizzy I missed work for 2 days"  . Metformin And Related Shortness Of Breath  . Sulfa Antibiotics Shortness Of Breath and Rash  . Amoxicillin     rash   . Lisinopril     Cough   . Doxycycline Rash    PAST MEDICAL HISTORY Past Medical History:  Diagnosis Date  . Angina    NO ANGINA SINCE CABG  . Arthritis    right ankle  . Chronic daily headache    "@ least every  other day""improved since heart surgery"  . Coronary artery disease    PT STATES HEART DOING WELL SINCE CABG SURGERY - DR. TILLEY IS HIS CARDIOLOGIST  . Diabetes mellitus    Lantus x 10 yrs  . Gout   . History of kidney stones 09-08-12   multiple times with some lithotripsies  . Hyperlipemia   . Hypertension    Past Surgical History:  Procedure Laterality Date  . BACK SURGERY     microsurgery: spinal stenosis -lumbar  . bone spur  1990's   right great toe; "probably related to gout"  . CARDIAC CATHETERIZATION  04/27/11  . CATARACT EXTRACTION W/ INTRAOCULAR LENS IMPLANT  1990's   right; "lens was replaced twice over 3 months; lens is actually over iris"  . CORONARY ARTERY BYPASS GRAFT  04/30/2011   Procedure: CORONARY ARTERY BYPASS GRAFTING (CABG);  Surgeon: Melrose Nakayama, MD;  Location: [facility];  Service: Open Heart Surgery;  Laterality: N/A;,x4 vessels  . CYSTOSCOPY W/ URETERAL STENT PLACEMENT Right 08/19/2012   Procedure: CYSTOSCOPY WITH RETROGRADE PYELOGRAM/URETERAL STENT PLACEMENT;  Surgeon: Molli Hazard, MD;  Location: [facility];  Service: Urology;  Laterality: Right;  . CYSTOSCOPY WITH RETROGRADE PYELOGRAM, URETEROSCOPY AND STENT PLACEMENT Left 07/17/2013   Procedure: CYSTOSCOPY, LEFT URETEROSCOPY, BASKET EXTRACTION OF STONE, INSERTION OF LEFT URETERAL STENT, ;  Surgeon: Jorja Loa, MD;  Location: [facility];  Service: Urology;  Laterality: Left;  . CYSTOSCOPY/RETROGRADE/URETEROSCOPY Right 09/12/2012   Procedure: CYSTOSCOPY/RETROGRADE/URETEROSCOPY;  Surgeon: Franchot Gallo, MD;  Location: [facility];  Service: Urology;  Laterality: Right;  . HOLMIUM LASER APPLICATION Right 0/53/9767   Procedure: HOLMIUM LASER APPLICATION;  Surgeon: Franchot Gallo, MD;  Location: [facility];  Service: Urology;  Laterality: Right;  . HOLMIUM LASER APPLICATION Left 3/41/9379   Procedure: HOLMIUM LASER APPLICATION;  Surgeon: Jorja Loa, MD;  Location: [facility];  Service: Urology;   Laterality: Left;  . KIDNEY STONE SURGERY     "probably 3 times"  . LEFT HEART CATHETERIZATION WITH CORONARY ANGIOGRAM N/A 04/28/2011   Procedure: LEFT HEART CATHETERIZATION WITH CORONARY ANGIOGRAM;  Surgeon: Jacolyn Reedy, MD;  Location: [facility];  Service: Cardiovascular;  Laterality: N/A;  . LITHOTRIPSY     "many; probably 5 times"  . RADIAL ARTERY HARVEST  04/30/2011   Procedure: RADIAL ARTERY HARVEST;  Surgeon: Melrose Nakayama, MD;  Location: [facility];  Service: Open Heart Surgery;  Laterality: Left;  . RETINAL DETACHMENT SURGERY  1990's   right eye; "made me have an early cataract"  . VASECTOMY      FAMILY HISTORY Family History  Problem Relation Age of Onset  . Cataracts Father   . Cataracts Mother   . Diabetes Paternal Aunt   . Diabetes Paternal Grandmother   . Amblyopia Neg Hx   . Blindness Neg Hx   . Glaucoma Neg Hx   . Macular degeneration Neg Hx   . Retinal detachment Neg Hx   . Strabismus Neg Hx   . Retinitis pigmentosa Neg Hx     SOCIAL HISTORY Social History   Tobacco Use  . Smoking status: Never Smoker  . Smokeless tobacco: Never Used  Substance Use Topics  . Alcohol use: Yes    Comment:  OCCAS BEER  OR MIXED DRINK LAST MARIJUANA COUPLE MONTHS AGO  . Drug use: Yes    Types: Marijuana    Comment: last marijuana use ~ 2010; "very rare". since         OPHTHALMIC EXAM:  Base Eye Exam    Visual Acuity (Snellen - Linear)      Right Left   Dist [place] 20/40    Dist cc  20/30 -1   Dist ph [place] 20/25 -2    Dist ph cc  20/25 -2   Correction:  Contacts       Tonometry (Tonopen, 8:40 AM)      Right Left   Pressure 21 23       Pupils      Dark Light Shape React APD   Right 5 3 Round Brisk None   Left 4 3 Round Brisk  None       Visual Fields (Counting fingers)      Left Right    Full Full       Extraocular Movement      Right Left    Full, Ortho Full, Ortho       Neuro/Psych    Oriented x3:  Yes   Mood/Affect:  Normal        Dilation    Both eyes:  1.0% Mydriacyl, 2.5% Phenylephrine @ 8:40 AM        Slit Lamp and Fundus Exam    Slit Lamp Exam      Right Left   Lids/Lashes Dermatochalasis - upper lid, mild Meibomian gland dysfunction Dermatochalasis - upper lid, mild Meibomian gland dysfunction   Conjunctiva/Sclera White and quiet White and quiet   Cornea Arcus, otherwise clear Arcus, otherwise clear   Anterior Chamber Deep, AC IOL in excellent postion; no cell/flare Deep and quiet   Iris Round and dilated. Patent PIs at 1030 and 1200 Round and dilated   Lens ACIOL; Mild Posterior capsular opacification / phimosis 2-3+ Nuclear sclerosis, 2+ Cortical cataract   Vitreous  Posterior vitreous detachment       Fundus Exam      Right Left   Disc compact, Peripapillary atrophy Compact, mild temporal Peripapillary atrophy, mildly Tilted disc   C/D Ratio 0.2 0.4   Macula Flat, RPE mottling and clumping, No heme or edema Blunted foveal reflex, Flat, Retinal pigment epithelial mottling   Vessels Vascular attenuation Vascular attenuation   Periphery Attached, Chorioretinal scarring almost 360 Attached, Chorioretinal scarring at 0300, pigmented cobblestoning inferiorly, VR tuft at 1030 ora / ora bays         Refraction    Wearing Rx      Sphere   Right    Left -6.00  Pt is wearing cl OS only       Manifest Refraction      Sphere Cylinder Axis Dist VA   Right -1.00 Sphere  20/25-1   Left -6.75 +0.50 015 20/25-2          IMAGING AND PROCEDURES  Imaging and Procedures for 05/03/17  OCT, Retina - OU - Both Eyes     Right Eye Quality was good. Central Foveal Thickness: 300. Progression has no prior data. Findings include normal foveal contour, no IRF, no SRF, epiretinal membrane.   Left Eye Quality was good. Central Foveal Thickness: 282. Progression has no prior data. Findings include normal foveal contour, no IRF, no SRF (Mild ERM).   Notes *Images captured and stored on drive  Diagnosis /  Impression:  NFP, No IRF/SRF OU  Clinical management:  See below  Abbreviations: NFP - Normal foveal profile. CME - cystoid macular edema. PED - pigment epithelial detachment. IRF - intraretinal fluid. SRF - subretinal fluid. EZ - ellipsoid zone. ERM - epiretinal membrane. ORA - outer retinal atrophy. ORT - outer retinal tubulation. SRHM - subretinal hyper-reflective material                  ASSESSMENT/PLAN:    ICD-10-CM   1. Posterior vitreous detachment of left eye H43.812   2. History of retinal detachment Z86.69   3. Retinal edema H35.81 OCT, Retina - OU - Both Eyes  4. Pseudophakia, right eye Z96.1   5. Iritis of right eye H20.9   6. 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 PVD / vitreous syneresis OS  Discussed findings and  prognosis  No RT or RD on 360 scleral depressed exam  Reviewed s/s of RT/RD  Strict return precautions for any such RT/RD signs/symptoms  F/U 3-4 weeks  2. History of retinal detachment OD- - retina attached - beautiful surgery done by expert surgeon Dr. Zigmund Daniel - per pt history sounds like a pneumatic retinopexy - monitor  3. No retinal edema on exam or OCT  4. Pseudophakia OD  - s/p CE w/ ACIOL OD  - ACIOL in good position, doing well  - monitor  5. Iritis OD - was placed on PF by Dr. Marvel Plan - iritis appears resolved -- pt now asymptomatic - pt taking PF 5-6x/day - recommend PF taper -- 4,3,2,1 drops daily, decrease weekly  6. Combined form age-related cataract OS- - The symptoms of cataract, surgical options, and treatments and risks were discussed with patient. - discussed diagnosis and progression - pt scheduled for cataract evaluation with Dr. Talbert Forest 25th of March    Ophthalmic Meds Ordered this visit:  No orders of the defined types were placed in this encounter.      Return in about 4 weeks (around 05/31/2017) for F/U PVD OS.  There are no Patient Instructions on file for this  visit.   Explained the diagnoses, plan, and follow up with the patient and they expressed understanding.  Patient expressed understanding of the importance of proper follow up care.   This document serves as a record of services personally performed by Gardiner Sleeper, MD, PhD. It was created on their behalf by Catha Brow, [place], a certified ophthalmic assistant. The creation of this record is the provider's dictation and/or activities during the visit.  Electronically signed by: Catha Brow, COA  05/03/17 12:13 PM   Gardiner Sleeper, M.D., Ph.D. Diseases & Surgery of the Retina and [place] 05/03/17   I have reviewed the above documentation for accuracy and completeness, and I agree with the above. Gardiner Sleeper, M.D., Ph.D. 05/03/17 12:13 PM     Abbreviations: M myopia (nearsighted); A astigmatism; H hyperopia (farsighted); P presbyopia; Mrx spectacle prescription;  CTL contact lenses; OD right eye; OS left eye; OU both eyes  XT exotropia; ET esotropia; PEK punctate epithelial keratitis; PEE punctate epithelial erosions; DES dry eye syndrome; MGD meibomian gland dysfunction; ATs artificial tears; PFAT's preservative free artificial tears; [place] nuclear sclerotic cataract; PSC posterior subcapsular cataract; ERM epi-retinal membrane; PVD posterior vitreous detachment; RD retinal detachment; DM diabetes mellitus; DR diabetic retinopathy; NPDR non-proliferative diabetic retinopathy; PDR proliferative diabetic retinopathy; CSME clinically significant macular edema; DME diabetic macular edema; dbh dot blot hemorrhages; CWS cotton wool spot; POAG primary open angle glaucoma; C/D cup-to-disc ratio; HVF humphrey visual field; GVF goldmann visual field; OCT optical coherence tomography; IOP intraocular pressure; BRVO Branch retinal vein occlusion; CRVO central retinal vein occlusion; CRAO central retinal artery occlusion; BRAO branch retinal artery occlusion;  RT retinal tear; SB scleral buckle; PPV pars plana vitrectomy; VH Vitreous hemorrhage; PRP panretinal laser photocoagulation; IVK intravitreal kenalog; VMT vitreomacular traction; MH Macular hole;  NVD neovascularization of the disc; NVE neovascularization elsewhere; AREDS age related eye disease study; ARMD age related macular degeneration; POAG primary open angle glaucoma; EBMD epithelial/anterior basement membrane dystrophy; ACIOL anterior chamber intraocular lens; IOL intraocular lens; PCIOL posterior chamber intraocular lens; Phaco/IOL phacoemulsification with intraocular lens placement; [place] photorefractive keratectomy; LASIK laser assisted in situ keratomileusis; HTN hypertension; DM diabetes mellitus; COPD chronic obstructive pulmonary disease

## 2017-05-03 ENCOUNTER — Encounter (INDEPENDENT_AMBULATORY_CARE_PROVIDER_SITE_OTHER): Payer: Self-pay | Admitting: Ophthalmology

## 2017-05-03 ENCOUNTER — Ambulatory Visit (INDEPENDENT_AMBULATORY_CARE_PROVIDER_SITE_OTHER): Payer: Medicare Other | Admitting: Ophthalmology

## 2017-05-03 DIAGNOSIS — H3581 Retinal edema: Secondary | ICD-10-CM | POA: Diagnosis not present

## 2017-05-03 DIAGNOSIS — Z8669 Personal history of other diseases of the nervous system and sense organs: Secondary | ICD-10-CM | POA: Diagnosis not present

## 2017-05-03 DIAGNOSIS — Z961 Presence of intraocular lens: Secondary | ICD-10-CM | POA: Diagnosis not present

## 2017-05-03 DIAGNOSIS — H25812 Combined forms of age-related cataract, left eye: Secondary | ICD-10-CM | POA: Diagnosis not present

## 2017-05-03 DIAGNOSIS — H43812 Vitreous degeneration, left eye: Secondary | ICD-10-CM | POA: Diagnosis not present

## 2017-05-03 DIAGNOSIS — H209 Unspecified iridocyclitis: Secondary | ICD-10-CM

## 2017-05-27 NOTE — Progress Notes (Deleted)
[facility] Note  06/01/2017     CHIEF COMPLAINT Patient presents for No chief complaint on file.   HISTORY OF PRESENT ILLNESS: Mitchell Lambert is a 67 y.o. male who presents to the clinic today for:   Pt states he saw Dr. Marvel Plan last week for flashing OS; Pt states 5-6 weeks ago he felt like we was looking through a fog OS; Pt states 1-2 weeks ago he saw a "light flash" OS; Pt states he had retinal detachment sx with Dr. Zigmund Daniel in the 90s OD (gas bubble placed and laser retinopexy), states Eps then did a cataract sx, reports he had 2 failed cataract sx OD, reports the last cataract sx IOL was placed "in front of iris"; Pt states he became concerned about a detachment in OS; Pt states he is seeing a type of string floater, pt states since seeing initial flash he no longer noticed any additional flashes; Pt reports he has been suffering from head cold for the past few weeks; Pt states he has an appointment with Dr. Talbert Forest for possible cataract sx OS;   Referring physician: Lawerance Cruel, MD [place], [place] [address]  HISTORICAL INFORMATION:   Selected notes from the MEDICAL RECORD NUMBER Referred by Dr. Gerarda Fraction for concern of new onset floaters/flashes OS;  LEE- 12.27.18 (W. Marvel Plan) [BCVA OD: 20/40 OS: 20/50+2] Ocular Hx- cataract OS, S/P RD OD PMH- DM    CURRENT MEDICATIONS: No current outpatient medications on file. (Ophthalmic Drugs)   No current facility-administered medications for this visit.  (Ophthalmic Drugs)   Current Outpatient Medications (Other)  Medication Sig  . ACCU-CHEK GUIDE test strip USE TO TEST BLOOD SUGAR 2 TO 3 TIMES A DAY  . allopurinol (ZYLOPRIM) 300 MG tablet Take 300 mg by mouth daily.  Marland Kitchen aspirin EC 81 MG tablet Take 81 mg by mouth every morning.   Marland Kitchen atorvastatin (LIPITOR) 40 MG tablet Take 40 mg by mouth daily.  . B-D ULTRAFINE III SHORT PEN 31G X 8 MM MISC USE UTD WITH FLEXPEN.  .  Biotin 5000 MCG TABS Take 5,000 mcg by mouth daily.   . bisoprolol (ZEBETA) 5 MG tablet TK 1 T PO QD  . Cholecalciferol (VITAMIN D3) 5000 UNITS TABS Take 5,000 Units by mouth daily.   . ciprofloxacin (CIPRO) 250 MG tablet Take 1 tablet (250 mg total) by mouth 2 (two) times daily. (Patient not taking: Reported on 04/27/2014)  . fish oil-omega-3 fatty acids 1000 MG capsule Take 1 g by mouth 2 (two) times daily.  Marland Kitchen glucosamine-chondroitin 500-400 MG tablet Take 2 tablets by mouth daily.  Marland Kitchen HYDROcodone-acetaminophen (NORCO) 5-325 MG tablet Take 1 tablet by mouth every 6 (six) hours as needed.  Marland Kitchen ibuprofen (ADVIL,MOTRIN) 200 MG tablet Take 400 mg by mouth every 8 (eight) hours as needed for moderate pain.   Marland Kitchen insulin glargine (LANTUS) 100 units/mL SOLN Inject 50 Units into the skin every morning.   Marland Kitchen L-Lysine HCl 1000 MG TABS Take 1 tablet by mouth daily.  . magnesium oxide (MAG-OX) 400 MG tablet Take 400 mg by mouth 2 (two) times daily.   . metoprolol tartrate (LOPRESSOR) 25 MG tablet Take 25 mg by mouth 2 (two) times daily.  . naproxen (NAPROSYN) 500 MG tablet Take 500 mg by mouth daily.  Marland Kitchen NOVOLOG FLEXPEN 100 UNIT/ML FlexPen INJECT 6 TO 12 UNITS INTO THE SKIN UTD BEFORE A MEAL  . oxybutynin (DITROPAN) 5 MG tablet Take  1 tablet (5 mg total) by mouth 3 (three) times daily. (Patient not taking: Reported on 04/27/2014)  . simvastatin (ZOCOR) 40 MG tablet Take 40 mg by mouth every morning. Reported on 03/31/2015  . vitamin C (ASCORBIC ACID) 500 MG tablet Take 1,000 mg by mouth daily.   Marland Kitchen VITAMIN E PO Take 1 capsule by mouth daily.   No current facility-administered medications for this visit.  (Other)      REVIEW OF SYSTEMS:    ALLERGIES Allergies  Allergen Reactions  . Gabapentin Other (See Comments)    "made me so dizzy I missed work for 2 days"  . Metformin And Related Shortness Of Breath  . Sulfa Antibiotics Shortness Of Breath and Rash  . Amoxicillin     rash   . Lisinopril      Cough   . Doxycycline Rash    PAST MEDICAL HISTORY Past Medical History:  Diagnosis Date  . Angina    NO ANGINA SINCE CABG  . Arthritis    right ankle  . Chronic daily headache    "@ least every other day""improved since heart surgery"  . Coronary artery disease    PT STATES HEART DOING WELL SINCE CABG SURGERY - DR. TILLEY IS HIS CARDIOLOGIST  . Diabetes mellitus    Lantus x 10 yrs  . Gout   . History of kidney stones 09-08-12   multiple times with some lithotripsies  . Hyperlipemia   . Hypertension    Past Surgical History:  Procedure Laterality Date  . BACK SURGERY     microsurgery: spinal stenosis -lumbar  . bone spur  1990's   right great toe; "probably related to gout"  . CARDIAC CATHETERIZATION  04/27/11  . CATARACT EXTRACTION W/ INTRAOCULAR LENS IMPLANT  1990's   right; "lens was replaced twice over 3 months; lens is actually over iris"  . CORONARY ARTERY BYPASS GRAFT  04/30/2011   Procedure: CORONARY ARTERY BYPASS GRAFTING (CABG);  Surgeon: Melrose Nakayama, MD;  Location: [facility];  Service: Open Heart Surgery;  Laterality: N/A;,x4 vessels  . CYSTOSCOPY W/ URETERAL STENT PLACEMENT Right 08/19/2012   Procedure: CYSTOSCOPY WITH RETROGRADE PYELOGRAM/URETERAL STENT PLACEMENT;  Surgeon: Molli Hazard, MD;  Location: [facility];  Service: Urology;  Laterality: Right;  . CYSTOSCOPY WITH RETROGRADE PYELOGRAM, URETEROSCOPY AND STENT PLACEMENT Left 07/17/2013   Procedure: CYSTOSCOPY, LEFT URETEROSCOPY, BASKET EXTRACTION OF STONE, INSERTION OF LEFT URETERAL STENT, ;  Surgeon: Jorja Loa, MD;  Location: [facility];  Service: Urology;  Laterality: Left;  . CYSTOSCOPY/RETROGRADE/URETEROSCOPY Right 09/12/2012   Procedure: CYSTOSCOPY/RETROGRADE/URETEROSCOPY;  Surgeon: Franchot Gallo, MD;  Location: [facility];  Service: Urology;  Laterality: Right;  . HOLMIUM LASER APPLICATION Right 0/62/3762   Procedure: HOLMIUM LASER APPLICATION;  Surgeon: Franchot Gallo, MD;  Location: [facility];  Service: Urology;  Laterality: Right;  . HOLMIUM LASER APPLICATION Left 10/31/5174   Procedure: HOLMIUM LASER APPLICATION;  Surgeon: Jorja Loa, MD;  Location: [facility];  Service: Urology;  Laterality: Left;  . KIDNEY STONE SURGERY     "probably 3 times"  . LEFT HEART CATHETERIZATION WITH CORONARY ANGIOGRAM N/A 04/28/2011   Procedure: LEFT HEART CATHETERIZATION WITH CORONARY ANGIOGRAM;  Surgeon: Jacolyn Reedy, MD;  Location: [facility] C[facility];  Service: Cardiovascular;  Laterality: N/A;  . LITHOTRIPSY     "many; probably 5 times"  . RADIAL ARTERY HARVEST  04/30/2011   Procedure: RADIAL ARTERY HARVEST;  Surgeon: Melrose Nakayama, MD;  Location: [facility];  Service: Open Heart  Surgery;  Laterality: Left;  . RETINAL DETACHMENT SURGERY  1990's   right eye; "made me have an early cataract"  . VASECTOMY      FAMILY HISTORY Family History  Problem Relation Age of Onset  . Cataracts Father   . Cataracts Mother   . Diabetes Paternal Aunt   . Diabetes Paternal Grandmother   . Amblyopia Neg Hx   . Blindness Neg Hx   . Glaucoma Neg Hx   . Macular degeneration Neg Hx   . Retinal detachment Neg Hx   . Strabismus Neg Hx   . Retinitis pigmentosa Neg Hx     SOCIAL HISTORY Social History   Tobacco Use  . Smoking status: Never Smoker  . Smokeless tobacco: Never Used  Substance Use Topics  . Alcohol use: Yes    Comment:  OCCAS BEER  OR MIXED DRINK LAST MARIJUANA COUPLE MONTHS AGO  . Drug use: Yes    Types: Marijuana    Comment: last marijuana use ~ 2010; "very rare". since         OPHTHALMIC EXAM:   Not recorded      IMAGING AND PROCEDURES  Imaging and Procedures for 05/27/17           ASSESSMENT/PLAN:    ICD-10-CM   1. Posterior vitreous detachment of left eye H43.812   2. History of retinal detachment Z86.69   3. Retinal edema H35.81 OCT, Retina - OU - Both Eyes  4. Pseudophakia, right eye Z96.1   5. Iritis of right eye H20.9   6. Combined forms of  age-related cataract, left eye H25.812     1. PVD / vitreous syneresis OS  Discussed findings and prognosis  No RT or RD on 360 scleral depressed exam  Reviewed s/s of RT/RD  Strict return precautions for any such RT/RD signs/symptoms  F/U 3-4 weeks  2. History of retinal detachment OD- - retina attached - beautiful surgery done by expert surgeon Dr. Zigmund Daniel - per pt history sounds like a pneumatic retinopexy - monitor  3. No retinal edema on exam or OCT  4. Pseudophakia OD  - s/p CE w/ ACIOL OD  - ACIOL in good position, doing well  - monitor  5. Iritis OD - was placed on PF by Dr. Marvel Plan - iritis appears resolved -- pt now asymptomatic - pt taking PF 5-6x/day - recommend PF taper -- 4,3,2,1 drops daily, decrease weekly  6. Combined form age-related cataract OS- - The symptoms of cataract, surgical options, and treatments and risks were discussed with patient. - discussed diagnosis and progression - pt scheduled for cataract evaluation with Dr. Talbert Forest 25th of March    Ophthalmic Meds Ordered this visit:  No orders of the defined types were placed in this encounter.      No follow-ups on file.  There are no Patient Instructions on file for this visit.   Explained the diagnoses, plan, and follow up with the patient and they expressed understanding.  Patient expressed understanding of the importance of proper follow up care.   This document serves as a record of services personally performed by Gardiner Sleeper, MD, PhD. It was created on their behalf by Catha Brow, [place], a certified ophthalmic assistant. The creation of this record is the provider's dictation and/or activities during the visit.  Electronically signed by: Catha Brow, [place]  05/27/17 8:33 AM   Gardiner Sleeper, M.D., Ph.D. Diseases & Surgery of the Retina and [place]  05/27/17     Abbreviations: M myopia (nearsighted); A astigmatism; H hyperopia  (farsighted); P presbyopia; Mrx spectacle prescription;  CTL contact lenses; OD right eye; OS left eye; OU both eyes  XT exotropia; ET esotropia; PEK punctate epithelial keratitis; PEE punctate epithelial erosions; DES dry eye syndrome; MGD meibomian gland dysfunction; ATs artificial tears; PFAT's preservative free artificial tears; [place] nuclear sclerotic cataract; PSC posterior subcapsular cataract; ERM epi-retinal membrane; PVD posterior vitreous detachment; RD retinal detachment; DM diabetes mellitus; DR diabetic retinopathy; NPDR non-proliferative diabetic retinopathy; PDR proliferative diabetic retinopathy; CSME clinically significant macular edema; DME diabetic macular edema; dbh dot blot hemorrhages; CWS cotton wool spot; POAG primary open angle glaucoma; C/D cup-to-disc ratio; HVF humphrey visual field; GVF goldmann visual field; OCT optical coherence tomography; IOP intraocular pressure; BRVO Branch retinal vein occlusion; CRVO central retinal vein occlusion; CRAO central retinal artery occlusion; BRAO branch retinal artery occlusion; RT retinal tear; SB scleral buckle; PPV pars plana vitrectomy; VH Vitreous hemorrhage; PRP panretinal laser photocoagulation; IVK intravitreal kenalog; VMT vitreomacular traction; MH Macular hole;  NVD neovascularization of the disc; NVE neovascularization elsewhere; AREDS age related eye disease study; ARMD age related macular degeneration; POAG primary open angle glaucoma; EBMD epithelial/anterior basement membrane dystrophy; ACIOL anterior chamber intraocular lens; IOL intraocular lens; PCIOL posterior chamber intraocular lens; Phaco/IOL phacoemulsification with intraocular lens placement; [place] photorefractive keratectomy; LASIK laser assisted in situ keratomileusis; HTN hypertension; DM diabetes mellitus; COPD chronic obstructive pulmonary disease

## 2017-06-01 ENCOUNTER — Encounter (INDEPENDENT_AMBULATORY_CARE_PROVIDER_SITE_OTHER): Payer: Medicare Other | Admitting: Ophthalmology

## 2017-08-04 DIAGNOSIS — Z794 Long term (current) use of insulin: Secondary | ICD-10-CM | POA: Diagnosis not present

## 2017-08-04 DIAGNOSIS — K219 Gastro-esophageal reflux disease without esophagitis: Secondary | ICD-10-CM | POA: Diagnosis not present

## 2017-08-04 DIAGNOSIS — J45991 Cough variant asthma: Secondary | ICD-10-CM | POA: Diagnosis not present

## 2017-08-04 DIAGNOSIS — M79609 Pain in unspecified limb: Secondary | ICD-10-CM | POA: Diagnosis not present

## 2017-08-04 DIAGNOSIS — Z125 Encounter for screening for malignant neoplasm of prostate: Secondary | ICD-10-CM | POA: Diagnosis not present

## 2017-08-04 DIAGNOSIS — E1169 Type 2 diabetes mellitus with other specified complication: Secondary | ICD-10-CM | POA: Diagnosis not present

## 2017-08-04 DIAGNOSIS — I10 Essential (primary) hypertension: Secondary | ICD-10-CM | POA: Diagnosis not present

## 2017-08-04 DIAGNOSIS — E7800 Pure hypercholesterolemia, unspecified: Secondary | ICD-10-CM | POA: Diagnosis not present

## 2017-08-04 DIAGNOSIS — Z0000 Encounter for general adult medical examination without abnormal findings: Secondary | ICD-10-CM | POA: Diagnosis not present

## 2017-08-04 DIAGNOSIS — Z23 Encounter for immunization: Secondary | ICD-10-CM | POA: Diagnosis not present

## 2017-08-04 DIAGNOSIS — M109 Gout, unspecified: Secondary | ICD-10-CM | POA: Diagnosis not present

## 2017-08-04 DIAGNOSIS — F419 Anxiety disorder, unspecified: Secondary | ICD-10-CM | POA: Diagnosis not present

## 2017-09-06 DIAGNOSIS — I10 Essential (primary) hypertension: Secondary | ICD-10-CM | POA: Diagnosis not present

## 2017-09-14 DIAGNOSIS — E1169 Type 2 diabetes mellitus with other specified complication: Secondary | ICD-10-CM | POA: Diagnosis not present

## 2017-09-14 DIAGNOSIS — I10 Essential (primary) hypertension: Secondary | ICD-10-CM | POA: Diagnosis not present

## 2017-09-14 DIAGNOSIS — Z794 Long term (current) use of insulin: Secondary | ICD-10-CM | POA: Diagnosis not present

## 2017-09-14 DIAGNOSIS — M5416 Radiculopathy, lumbar region: Secondary | ICD-10-CM | POA: Diagnosis not present

## 2017-09-14 DIAGNOSIS — E11649 Type 2 diabetes mellitus with hypoglycemia without coma: Secondary | ICD-10-CM | POA: Diagnosis not present

## 2017-10-12 DIAGNOSIS — M47816 Spondylosis without myelopathy or radiculopathy, lumbar region: Secondary | ICD-10-CM | POA: Diagnosis not present

## 2017-10-12 DIAGNOSIS — M5136 Other intervertebral disc degeneration, lumbar region: Secondary | ICD-10-CM | POA: Diagnosis not present

## 2017-10-12 DIAGNOSIS — M4317 Spondylolisthesis, lumbosacral region: Secondary | ICD-10-CM | POA: Diagnosis not present

## 2017-10-12 DIAGNOSIS — Z9889 Other specified postprocedural states: Secondary | ICD-10-CM | POA: Diagnosis not present

## 2017-10-12 DIAGNOSIS — I10 Essential (primary) hypertension: Secondary | ICD-10-CM | POA: Diagnosis not present

## 2017-10-12 DIAGNOSIS — M5416 Radiculopathy, lumbar region: Secondary | ICD-10-CM | POA: Diagnosis not present

## 2017-10-12 DIAGNOSIS — M4316 Spondylolisthesis, lumbar region: Secondary | ICD-10-CM | POA: Diagnosis not present

## 2017-10-12 DIAGNOSIS — M546 Pain in thoracic spine: Secondary | ICD-10-CM | POA: Diagnosis not present

## 2017-10-12 DIAGNOSIS — M549 Dorsalgia, unspecified: Secondary | ICD-10-CM | POA: Diagnosis not present

## 2017-10-12 DIAGNOSIS — Z6832 Body mass index (BMI) 32.0-32.9, adult: Secondary | ICD-10-CM | POA: Diagnosis not present

## 2017-10-12 DIAGNOSIS — G5712 Meralgia paresthetica, left lower limb: Secondary | ICD-10-CM | POA: Diagnosis not present

## 2017-10-21 DIAGNOSIS — M47816 Spondylosis without myelopathy or radiculopathy, lumbar region: Secondary | ICD-10-CM | POA: Diagnosis not present

## 2017-10-21 DIAGNOSIS — M5126 Other intervertebral disc displacement, lumbar region: Secondary | ICD-10-CM | POA: Diagnosis not present

## 2017-10-21 DIAGNOSIS — M4316 Spondylolisthesis, lumbar region: Secondary | ICD-10-CM | POA: Diagnosis not present

## 2017-10-21 DIAGNOSIS — Z9889 Other specified postprocedural states: Secondary | ICD-10-CM | POA: Diagnosis not present

## 2017-10-21 DIAGNOSIS — M5416 Radiculopathy, lumbar region: Secondary | ICD-10-CM | POA: Diagnosis not present

## 2017-10-21 DIAGNOSIS — M4317 Spondylolisthesis, lumbosacral region: Secondary | ICD-10-CM | POA: Diagnosis not present

## 2017-10-21 DIAGNOSIS — M48061 Spinal stenosis, lumbar region without neurogenic claudication: Secondary | ICD-10-CM | POA: Diagnosis not present

## 2017-11-04 DIAGNOSIS — M48061 Spinal stenosis, lumbar region without neurogenic claudication: Secondary | ICD-10-CM | POA: Diagnosis not present

## 2017-11-04 DIAGNOSIS — M4726 Other spondylosis with radiculopathy, lumbar region: Secondary | ICD-10-CM | POA: Diagnosis not present

## 2017-11-04 DIAGNOSIS — M5136 Other intervertebral disc degeneration, lumbar region: Secondary | ICD-10-CM | POA: Diagnosis not present

## 2018-01-07 DIAGNOSIS — M5126 Other intervertebral disc displacement, lumbar region: Secondary | ICD-10-CM | POA: Diagnosis not present

## 2018-01-07 DIAGNOSIS — M5136 Other intervertebral disc degeneration, lumbar region: Secondary | ICD-10-CM | POA: Diagnosis not present

## 2018-01-07 DIAGNOSIS — M4316 Spondylolisthesis, lumbar region: Secondary | ICD-10-CM | POA: Diagnosis not present

## 2018-01-07 DIAGNOSIS — M48062 Spinal stenosis, lumbar region with neurogenic claudication: Secondary | ICD-10-CM | POA: Diagnosis not present

## 2018-01-07 DIAGNOSIS — M5416 Radiculopathy, lumbar region: Secondary | ICD-10-CM | POA: Diagnosis not present

## 2018-01-07 DIAGNOSIS — Z9889 Other specified postprocedural states: Secondary | ICD-10-CM | POA: Diagnosis not present

## 2018-01-07 DIAGNOSIS — M47816 Spondylosis without myelopathy or radiculopathy, lumbar region: Secondary | ICD-10-CM | POA: Diagnosis not present

## 2018-01-14 DIAGNOSIS — N201 Calculus of ureter: Secondary | ICD-10-CM | POA: Diagnosis not present

## 2018-01-18 DIAGNOSIS — E119 Type 2 diabetes mellitus without complications: Secondary | ICD-10-CM | POA: Diagnosis not present

## 2018-01-18 DIAGNOSIS — L03019 Cellulitis of unspecified finger: Secondary | ICD-10-CM | POA: Diagnosis not present

## 2018-01-25 DIAGNOSIS — N202 Calculus of kidney with calculus of ureter: Secondary | ICD-10-CM | POA: Diagnosis not present

## 2018-02-14 DIAGNOSIS — E1169 Type 2 diabetes mellitus with other specified complication: Secondary | ICD-10-CM | POA: Diagnosis not present

## 2018-02-14 DIAGNOSIS — Z23 Encounter for immunization: Secondary | ICD-10-CM | POA: Diagnosis not present

## 2018-02-14 DIAGNOSIS — M549 Dorsalgia, unspecified: Secondary | ICD-10-CM | POA: Diagnosis not present

## 2018-02-14 DIAGNOSIS — Z951 Presence of aortocoronary bypass graft: Secondary | ICD-10-CM | POA: Diagnosis not present

## 2018-02-14 DIAGNOSIS — F419 Anxiety disorder, unspecified: Secondary | ICD-10-CM | POA: Diagnosis not present

## 2018-02-14 DIAGNOSIS — I10 Essential (primary) hypertension: Secondary | ICD-10-CM | POA: Diagnosis not present

## 2018-02-15 ENCOUNTER — Telehealth: Payer: Self-pay | Admitting: Cardiology

## 2018-02-15 NOTE — Telephone Encounter (Signed)
Called patient and LVM for him to call back and schedule appt.  Patient is a prior Dr. Wynonia Lawman patient.

## 2018-03-21 DIAGNOSIS — F418 Other specified anxiety disorders: Secondary | ICD-10-CM | POA: Diagnosis not present

## 2018-03-21 DIAGNOSIS — R071 Chest pain on breathing: Secondary | ICD-10-CM | POA: Diagnosis not present

## 2018-03-21 DIAGNOSIS — Z6835 Body mass index (BMI) 35.0-35.9, adult: Secondary | ICD-10-CM | POA: Diagnosis not present

## 2018-03-21 DIAGNOSIS — I10 Essential (primary) hypertension: Secondary | ICD-10-CM | POA: Diagnosis not present

## 2018-04-08 ENCOUNTER — Encounter: Payer: Self-pay | Admitting: Cardiovascular Disease

## 2018-04-08 ENCOUNTER — Encounter (INDEPENDENT_AMBULATORY_CARE_PROVIDER_SITE_OTHER): Payer: Self-pay

## 2018-04-08 ENCOUNTER — Ambulatory Visit (INDEPENDENT_AMBULATORY_CARE_PROVIDER_SITE_OTHER): Payer: Medicare Other | Admitting: Cardiovascular Disease

## 2018-04-08 DIAGNOSIS — I10 Essential (primary) hypertension: Secondary | ICD-10-CM | POA: Diagnosis not present

## 2018-04-08 DIAGNOSIS — E782 Mixed hyperlipidemia: Secondary | ICD-10-CM | POA: Diagnosis not present

## 2018-04-08 DIAGNOSIS — Z951 Presence of aortocoronary bypass graft: Secondary | ICD-10-CM

## 2018-04-08 NOTE — Assessment & Plan Note (Signed)
History of CAD status post cardiac catheterization performed by Dr. Wynonia Lawman February 2013 revealing three-vessel disease with normal LV function.  He underwent CABG x4 by Dr. Roxan Hockey 04/27/2011 with a LIMA to the LAD, left radial to OM1, vein graft to diagonal branch and PDA.  He apparently had a nuclear stress test after that which was negative.  He gets occasional atypical chest pain once or twice a year but otherwise has remained stable.

## 2018-04-08 NOTE — Addendum Note (Signed)
Addended by: Annita Brod on: 04/08/2018 09:37 AM   Modules accepted: Orders

## 2018-04-08 NOTE — Progress Notes (Signed)
04/08/2018 DANH BAYUS   06-Jun-1950  [id number]  Primary Physician Lawerance Cruel, MD Primary Cardiologist: Lorretta Harp MD [name], [place]  HPI:  Mitchell Lambert is a 68 y.o. moderately overweight married Caucasian male father 81, grandfather of 7 grandchildren referred by Dr. Harrington Challenger to be established in my practice for ongoing cardiovascular care.  He previously was taken care of by Dr. Wynonia Lawman.  He works as a Health and safety inspector at [employer].  His risk factors include treated hypertension, diabetes and hyperlipidemia.  There is no family history for heart disease.  He is never had a heart attack or stroke.  He did undergo cardiac catheterization because of chest pain Dr. Wynonia Lawman February 2013 revealing three-vessel disease with preserved LV function and underwent CABG x4 by Dr. Roxan Hockey 04/28/2011 with a LIMA to the LAD, left radial to OM1, vein to diagonal branch and PDA.  He apparently had a Myoview stress test sometime after which which was negative.  He gets occasional atypical chest pain once or twice a year.   Current Meds  Medication Sig  . ACCU-CHEK GUIDE test strip USE TO TEST BLOOD SUGAR 2 TO 3 TIMES A DAY  . allopurinol (ZYLOPRIM) 300 MG tablet Take 300 mg by mouth daily.  Marland Kitchen aspirin EC 81 MG tablet Take 81 mg by mouth every morning.   Marland Kitchen atorvastatin (LIPITOR) 40 MG tablet Take 40 mg by mouth daily.  . B-D ULTRAFINE III SHORT PEN 31G X 8 MM MISC USE UTD WITH FLEXPEN.  . Biotin 5000 MCG TABS Take 5,000 mcg by mouth daily.   . bisoprolol (ZEBETA) 5 MG tablet TK 1 T PO QD  . Cholecalciferol (VITAMIN D3) 5000 UNITS TABS Take 5,000 Units by mouth daily.   . ciprofloxacin (CIPRO) 250 MG tablet Take 1 tablet (250 mg total) by mouth 2 (two) times daily.  . fish oil-omega-3 fatty acids 1000 MG capsule Take 1 g by mouth 2 (two) times daily.  Marland Kitchen glucosamine-chondroitin 500-400 MG tablet Take 2 tablets by mouth daily.  Marland Kitchen HYDROcodone-acetaminophen (NORCO) 5-325 MG  tablet Take 1 tablet by mouth every 6 (six) hours as needed.  Marland Kitchen ibuprofen (ADVIL,MOTRIN) 200 MG tablet Take 400 mg by mouth every 8 (eight) hours as needed for moderate pain.   . Insulin Glargine (BASAGLAR KWIKPEN) 100 UNIT/ML SOPN Inject 100 Units into the skin daily.  Marland Kitchen L-Lysine HCl 1000 MG TABS Take 1 tablet by mouth daily.  . magnesium oxide (MAG-OX) 400 MG tablet Take 400 mg by mouth 2 (two) times daily.   . metoprolol tartrate (LOPRESSOR) 25 MG tablet Take 25 mg by mouth 2 (two) times daily.  . naproxen (NAPROSYN) 500 MG tablet Take 500 mg by mouth daily.  Marland Kitchen NOVOLOG FLEXPEN 100 UNIT/ML FlexPen INJECT 6 TO 12 UNITS INTO THE SKIN UTD BEFORE A MEAL  . omeprazole (PRILOSEC) 10 MG capsule Take 10 mg by mouth daily.  . simvastatin (ZOCOR) 40 MG tablet Take 40 mg by mouth every morning. Reported on 03/31/2015  . vitamin C (ASCORBIC ACID) 500 MG tablet Take 1,000 mg by mouth daily.   Marland Kitchen VITAMIN E PO Take 1 capsule by mouth daily.     Allergies  Allergen Reactions  . Gabapentin Other (See Comments)    "made me so dizzy I missed work for 2 days"  . Metformin And Related Shortness Of Breath  . Sulfa Antibiotics Shortness Of Breath and Rash  . Amoxicillin  rash   . Lisinopril     Cough   . Doxycycline Rash    Social History   Socioeconomic History  . Marital status: Married    Spouse name: Not on file  . Number of children: Not on file  . Years of education: Not on file  . Highest education level: Not on file  Occupational History  . Not on file  Social Needs  . Financial resource strain: Not on file  . Food insecurity:    Worry: Not on file    Inability: Not on file  . Transportation needs:    Medical: Not on file    Non-medical: Not on file  Tobacco Use  . Smoking status: Never Smoker  . Smokeless tobacco: Never Used  Substance and Sexual Activity  . Alcohol use: Yes    Comment:  OCCAS BEER  OR MIXED DRINK LAST MARIJUANA COUPLE MONTHS AGO  . Drug use: Yes    Types:  Marijuana    Comment: last marijuana use ~ 2010; "very rare". since  . Sexual activity: Yes  Lifestyle  . Physical activity:    Days per week: Not on file    Minutes per session: Not on file  . Stress: Not on file  Relationships  . Social connections:    Talks on phone: Not on file    Gets together: Not on file    Attends religious service: Not on file    Active member of club or organization: Not on file    Attends meetings of clubs or organizations: Not on file    Relationship status: Not on file  . Intimate partner violence:    Fear of current or ex partner: Not on file    Emotionally abused: Not on file    Physically abused: Not on file    Forced sexual activity: Not on file  Other Topics Concern  . 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           Review of Systems: General: negative for chills, fever, night sweats or weight changes.  Cardiovascular: negative for chest pain, dyspnea on exertion, edema, orthopnea, palpitations, paroxysmal nocturnal dyspnea or shortness of breath Dermatological: negative for rash Respiratory: negative for cough or wheezing Urologic: negative for hematuria Abdominal: negative for nausea, vomiting, diarrhea, bright red blood per rectum, melena, or hematemesis Neurologic: negative for visual changes, syncope, or dizziness All other systems reviewed and are otherwise negative except as noted above.    Blood pressure (!) 152/92, pulse (!) 55, height 5\' 11"  (1.803 m), weight 250 lb 9.6 oz (113.7 kg).  General appearance: alert and no distress Neck: no adenopathy, no carotid bruit, no JVD, supple, symmetrical, trachea midline and thyroid not enlarged, symmetric, no tenderness/mass/nodules Lungs: clear to auscultation bilaterally Heart: regular rate and rhythm, S1, S2 normal, no murmur, click, rub or gallop Extremities: edema Normal Pulses: 2+ and symmetric Skin: Skin color,  texture, turgor normal. No rashes or lesions Neurologic: Alert and oriented X 3, normal strength and tone. Normal symmetric reflexes. Normal coordination and gait  EKG sinus bradycardia 55 with poor R wave progression.  I personally reviewed this EKG.  ASSESSMENT AND PLAN:   Hyperlipidemia History of hyperlipidemia on statin therapy with lipid profile performed 03/21/2018 revealing total cholesterol 142, LDL of 83 and HDL 35.  We talked about diet.  Does admit to dietary discretion.  I am going to provide  him with an AHA diet and we will recheck a lipid liver profile in 3 months.  Hypertension History of essential hypertension her blood pressure measured today 152/92.  He is on bisoprolol.  I am going to have him keep a blood pressure log over the next 30 days.  If his blood pressure remains elevated he may be a candidate for low-dose ACE inhibition given his diabetes.  S/P CABG (coronary artery bypass graft) History of CAD status post cardiac catheterization performed by Dr. Wynonia Lawman February 2013 revealing three-vessel disease with normal LV function.  He underwent CABG x4 by Dr. Roxan Hockey 04/27/2011 with a LIMA to the LAD, left radial to OM1, vein graft to diagonal branch and PDA.  He apparently had a nuclear stress test after that which was negative.  He gets occasional atypical chest pain once or twice a year but otherwise has remained stable.      Lorretta Harp MD FACP,FACC,FAHA, [place] [facility] 04/08/2018 9:20 AM

## 2018-04-08 NOTE — Patient Instructions (Signed)
Medication Instructions:  Your physician recommends that you continue on your current medications as directed. Please refer to the Current Medication list given to you today. If you need a refill on your cardiac medications before your next appointment, please call your pharmacy.   Lab work: Your physician recommends that you return for lab work in 3 months: lipid/liver If you have labs (blood work) drawn today and your tests are completely normal, you will receive your results only by: Marland Kitchen MyChart Message (if you have MyChart) OR . A paper copy in the mail If you have any lab test that is abnormal or we need to change your treatment, we will call you to review the results.  Testing/Procedures: none  Follow-Up: At [facility], you and your health needs are our priority.  As part of our continuing mission to provide you with exceptional heart care, we have created designated Provider Care Teams.  These Care Teams include your primary Cardiologist (physician) and Advanced Practice Providers (APPs -  Physician Assistants and Nurse Practitioners) who all work together to provide you with the care you need, when you need it. . You will need a follow up appointment in 12 months.  Please call our office 2 months in advance to schedule this appointment.  You may see Dr. Gwenlyn Found or one of the following Advanced Practice Providers on your designated Care Team:   . Kerin Ransom, [place] . Almyra Deforest, PA-C . Fabian Sharp, PA-C . Jory Sims, DNP . Rosaria Ferries, PA-C . Roby Lofts, PA-C . Sande Rives, PA-C  Any Other Special Instructions Will Be Listed Below (If Applicable). Keep a daily blood pressure log for 30 days and then follow up with a [facility] clinical pharmacist in the Hypertension Clinic for review and medication titration

## 2018-04-08 NOTE — Assessment & Plan Note (Signed)
History of hyperlipidemia on statin therapy with lipid profile performed 03/21/2018 revealing total cholesterol 142, LDL of 83 and HDL 35.  We talked about diet.  Does admit to dietary discretion.  I am going to provide him with an AHA diet and we will recheck a lipid liver profile in 3 months.

## 2018-04-08 NOTE — Assessment & Plan Note (Signed)
History of essential hypertension her blood pressure measured today 152/92.  He is on bisoprolol.  I am going to have him keep a blood pressure log over the next 30 days.  If his blood pressure remains elevated he may be a candidate for low-dose ACE inhibition given his diabetes.

## 2018-04-08 NOTE — Addendum Note (Signed)
Addended by: Crissie Reese on: 04/08/2018 09:49 AM   Modules accepted: Orders

## 2018-04-26 DIAGNOSIS — E1169 Type 2 diabetes mellitus with other specified complication: Secondary | ICD-10-CM | POA: Diagnosis not present

## 2018-04-26 DIAGNOSIS — E119 Type 2 diabetes mellitus without complications: Secondary | ICD-10-CM | POA: Diagnosis not present

## 2018-04-26 DIAGNOSIS — E11649 Type 2 diabetes mellitus with hypoglycemia without coma: Secondary | ICD-10-CM | POA: Diagnosis not present

## 2018-04-26 DIAGNOSIS — J45991 Cough variant asthma: Secondary | ICD-10-CM | POA: Diagnosis not present

## 2018-04-26 DIAGNOSIS — I10 Essential (primary) hypertension: Secondary | ICD-10-CM | POA: Diagnosis not present

## 2018-05-03 DIAGNOSIS — R748 Abnormal levels of other serum enzymes: Secondary | ICD-10-CM | POA: Diagnosis not present

## 2018-05-09 NOTE — Progress Notes (Deleted)
05/09/2018 KEANU LESNIAK 12-12-1950 [id number]   HPI:  Mitchell Lambert is a 68 y.o. male patient of Dr Gwenlyn Found, with a PMH below who presents today for hypertension clinic evaluation.  In addition to hypertension, his medical history is significant for CABG x 4 (2013), hyperlipidemia, DM (A1c 7.8)  Blood Pressure Goal:  130/80  Current Medications: bisoprolol 5 mg qd  Family Hx:  Social Hx:  Diet:  Exercise:  Home BP readings:  Intolerances: lisinopril caused cough  Labs:  05/02/18: Na 144, K 4.6, Glu 86, BUN 27, SCr 1.5  (CrCl 75.8)  Wt Readings from Last 3 Encounters:  04/08/18 250 lb 9.6 oz (113.7 kg)  03/31/15 244 lb 9.6 oz (110.9 kg)  04/27/14 230 lb (104.3 kg)   BP Readings from Last 3 Encounters:  04/08/18 (!) 152/92  03/31/15 140/90  04/27/14 156/75   Pulse Readings from Last 3 Encounters:  04/08/18 (!) 55  03/31/15 66  04/27/14 (!) 50    Current Outpatient Medications  Medication Sig Dispense Refill  . ACCU-CHEK GUIDE test strip USE TO TEST BLOOD SUGAR 2 TO 3 TIMES A DAY  5  . allopurinol (ZYLOPRIM) 300 MG tablet Take 300 mg by mouth daily.    Marland Kitchen aspirin EC 81 MG tablet Take 81 mg by mouth every morning.     Marland Kitchen atorvastatin (LIPITOR) 40 MG tablet Take 40 mg by mouth daily.    . B-D ULTRAFINE III SHORT PEN 31G X 8 MM MISC USE UTD WITH FLEXPEN.  3  . Biotin 5000 MCG TABS Take 5,000 mcg by mouth daily.     . bisoprolol (ZEBETA) 5 MG tablet TK 1 T PO QD  7  . Cholecalciferol (VITAMIN D3) 5000 UNITS TABS Take 5,000 Units by mouth daily.     . ciprofloxacin (CIPRO) 250 MG tablet Take 1 tablet (250 mg total) by mouth 2 (two) times daily. 6 tablet 0  . fish oil-omega-3 fatty acids 1000 MG capsule Take 1 g by mouth 2 (two) times daily.    Marland Kitchen glucosamine-chondroitin 500-400 MG tablet Take 2 tablets by mouth daily.    Marland Kitchen HYDROcodone-acetaminophen (NORCO) 5-325 MG tablet Take 1 tablet by mouth every 6 (six) hours as needed. 30 tablet 0  . ibuprofen (ADVIL,MOTRIN)  200 MG tablet Take 400 mg by mouth every 8 (eight) hours as needed for moderate pain.     . Insulin Glargine (BASAGLAR KWIKPEN) 100 UNIT/ML SOPN Inject 100 Units into the skin daily.    Marland Kitchen L-Lysine HCl 1000 MG TABS Take 1 tablet by mouth daily.    . magnesium oxide (MAG-OX) 400 MG tablet Take 400 mg by mouth 2 (two) times daily.     . naproxen (NAPROSYN) 500 MG tablet Take 500 mg by mouth daily.    Marland Kitchen NOVOLOG FLEXPEN 100 UNIT/ML FlexPen INJECT 6 TO 12 UNITS INTO THE SKIN UTD BEFORE A MEAL  1  . omeprazole (PRILOSEC) 10 MG capsule Take 10 mg by mouth daily.    . simvastatin (ZOCOR) 40 MG tablet Take 40 mg by mouth every morning. Reported on 03/31/2015    . vitamin C (ASCORBIC ACID) 500 MG tablet Take 1,000 mg by mouth daily.     Marland Kitchen VITAMIN E PO Take 1 capsule by mouth daily.     No current facility-administered medications for this visit.     Allergies  Allergen Reactions  . Gabapentin Other (See Comments)    "made me so dizzy I missed work  for 2 days"  . Metformin And Related Shortness Of Breath  . Sulfa Antibiotics Shortness Of Breath and Rash  . Amoxicillin     rash   . Lisinopril     Cough   . Doxycycline Rash    Past Medical History:  Diagnosis Date  . Angina    NO ANGINA SINCE CABG  . Arthritis    right ankle  . Chronic daily headache    "@ least every other day""improved since heart surgery"  . Coronary artery disease    PT STATES HEART DOING WELL SINCE CABG SURGERY - DR. TILLEY IS HIS CARDIOLOGIST  . Diabetes mellitus    Lantus x 10 yrs  . Gout   . History of kidney stones 09-08-12   multiple times with some lithotripsies  . Hyperlipemia   . Hypertension     There were no vitals taken for this visit.  No problem-specific Assessment & Plan notes found for this encounter.   Tommy Medal PharmD CPP [facility] [address] [place], [place] [address] [phone]

## 2018-05-10 ENCOUNTER — Ambulatory Visit: Payer: Medicare Other

## 2018-07-28 DIAGNOSIS — I10 Essential (primary) hypertension: Secondary | ICD-10-CM | POA: Diagnosis not present

## 2018-07-28 DIAGNOSIS — J45991 Cough variant asthma: Secondary | ICD-10-CM | POA: Diagnosis not present

## 2018-07-28 DIAGNOSIS — M79606 Pain in leg, unspecified: Secondary | ICD-10-CM | POA: Diagnosis not present

## 2018-07-28 DIAGNOSIS — E1169 Type 2 diabetes mellitus with other specified complication: Secondary | ICD-10-CM | POA: Diagnosis not present

## 2018-07-28 DIAGNOSIS — E7800 Pure hypercholesterolemia, unspecified: Secondary | ICD-10-CM | POA: Diagnosis not present

## 2018-08-25 DIAGNOSIS — J45991 Cough variant asthma: Secondary | ICD-10-CM | POA: Diagnosis not present

## 2018-08-25 DIAGNOSIS — E7800 Pure hypercholesterolemia, unspecified: Secondary | ICD-10-CM | POA: Diagnosis not present

## 2018-08-25 DIAGNOSIS — E1169 Type 2 diabetes mellitus with other specified complication: Secondary | ICD-10-CM | POA: Diagnosis not present

## 2018-08-25 DIAGNOSIS — I10 Essential (primary) hypertension: Secondary | ICD-10-CM | POA: Diagnosis not present

## 2018-09-08 DIAGNOSIS — I10 Essential (primary) hypertension: Secondary | ICD-10-CM | POA: Diagnosis not present

## 2018-09-08 DIAGNOSIS — E1169 Type 2 diabetes mellitus with other specified complication: Secondary | ICD-10-CM | POA: Diagnosis not present

## 2018-09-08 DIAGNOSIS — N529 Male erectile dysfunction, unspecified: Secondary | ICD-10-CM | POA: Diagnosis not present

## 2018-09-08 DIAGNOSIS — F418 Other specified anxiety disorders: Secondary | ICD-10-CM | POA: Diagnosis not present

## 2018-09-08 DIAGNOSIS — Z125 Encounter for screening for malignant neoplasm of prostate: Secondary | ICD-10-CM | POA: Diagnosis not present

## 2018-09-08 DIAGNOSIS — M109 Gout, unspecified: Secondary | ICD-10-CM | POA: Diagnosis not present

## 2018-09-08 DIAGNOSIS — K219 Gastro-esophageal reflux disease without esophagitis: Secondary | ICD-10-CM | POA: Diagnosis not present

## 2018-09-08 DIAGNOSIS — Z0000 Encounter for general adult medical examination without abnormal findings: Secondary | ICD-10-CM | POA: Diagnosis not present

## 2018-09-08 DIAGNOSIS — E7800 Pure hypercholesterolemia, unspecified: Secondary | ICD-10-CM | POA: Diagnosis not present

## 2018-09-08 DIAGNOSIS — Z794 Long term (current) use of insulin: Secondary | ICD-10-CM | POA: Diagnosis not present

## 2018-09-08 DIAGNOSIS — E669 Obesity, unspecified: Secondary | ICD-10-CM | POA: Diagnosis not present

## 2018-10-19 DIAGNOSIS — E1010 Type 1 diabetes mellitus with ketoacidosis without coma: Secondary | ICD-10-CM | POA: Diagnosis not present

## 2018-11-08 DIAGNOSIS — N202 Calculus of kidney with calculus of ureter: Secondary | ICD-10-CM | POA: Diagnosis not present

## 2018-11-08 DIAGNOSIS — N201 Calculus of ureter: Secondary | ICD-10-CM | POA: Diagnosis not present

## 2018-11-22 DIAGNOSIS — N200 Calculus of kidney: Secondary | ICD-10-CM | POA: Diagnosis not present

## 2018-12-09 DIAGNOSIS — E7800 Pure hypercholesterolemia, unspecified: Secondary | ICD-10-CM | POA: Diagnosis not present

## 2018-12-09 DIAGNOSIS — J45991 Cough variant asthma: Secondary | ICD-10-CM | POA: Diagnosis not present

## 2018-12-09 DIAGNOSIS — I10 Essential (primary) hypertension: Secondary | ICD-10-CM | POA: Diagnosis not present

## 2018-12-09 DIAGNOSIS — E1169 Type 2 diabetes mellitus with other specified complication: Secondary | ICD-10-CM | POA: Diagnosis not present

## 2019-01-08 DIAGNOSIS — Z20828 Contact with and (suspected) exposure to other viral communicable diseases: Secondary | ICD-10-CM | POA: Diagnosis not present

## 2019-01-11 DIAGNOSIS — R52 Pain, unspecified: Secondary | ICD-10-CM | POA: Diagnosis not present

## 2019-01-11 DIAGNOSIS — R519 Headache, unspecified: Secondary | ICD-10-CM | POA: Diagnosis not present

## 2019-01-11 DIAGNOSIS — U071 COVID-19: Secondary | ICD-10-CM | POA: Diagnosis not present

## 2019-01-13 ENCOUNTER — Emergency Department (HOSPITAL_COMMUNITY): Payer: Medicare Other

## 2019-01-13 ENCOUNTER — Encounter (HOSPITAL_COMMUNITY): Payer: Self-pay

## 2019-01-13 ENCOUNTER — Other Ambulatory Visit: Payer: Self-pay

## 2019-01-13 ENCOUNTER — Inpatient Hospital Stay (HOSPITAL_COMMUNITY)
Admission: EM | Admit: 2019-01-13 | Discharge: 2019-01-15 | DRG: 177 | Disposition: A | Discharge status: Home / Self Care | Payer: Medicare Other | Attending: Internal Medicine | Admitting: Internal Medicine

## 2019-01-13 DIAGNOSIS — I5032 Chronic diastolic (congestive) heart failure: Secondary | ICD-10-CM | POA: Diagnosis not present

## 2019-01-13 DIAGNOSIS — Z6832 Body mass index (BMI) 32.0-32.9, adult: Secondary | ICD-10-CM

## 2019-01-13 DIAGNOSIS — K219 Gastro-esophageal reflux disease without esophagitis: Secondary | ICD-10-CM | POA: Diagnosis present

## 2019-01-13 DIAGNOSIS — E860 Dehydration: Secondary | ICD-10-CM

## 2019-01-13 DIAGNOSIS — U071 COVID-19: Principal | ICD-10-CM

## 2019-01-13 DIAGNOSIS — Z7982 Long term (current) use of aspirin: Secondary | ICD-10-CM

## 2019-01-13 DIAGNOSIS — J9811 Atelectasis: Secondary | ICD-10-CM | POA: Diagnosis not present

## 2019-01-13 DIAGNOSIS — E785 Hyperlipidemia, unspecified: Secondary | ICD-10-CM | POA: Diagnosis not present

## 2019-01-13 DIAGNOSIS — E669 Obesity, unspecified: Secondary | ICD-10-CM | POA: Diagnosis not present

## 2019-01-13 DIAGNOSIS — I130 Hypertensive heart and chronic kidney disease with heart failure and stage 1 through stage 4 chronic kidney disease, or unspecified chronic kidney disease: Secondary | ICD-10-CM | POA: Diagnosis not present

## 2019-01-13 DIAGNOSIS — R110 Nausea: Secondary | ICD-10-CM | POA: Diagnosis not present

## 2019-01-13 DIAGNOSIS — Z9849 Cataract extraction status, unspecified eye: Secondary | ICD-10-CM | POA: Diagnosis not present

## 2019-01-13 DIAGNOSIS — Z881 Allergy status to other antibiotic agents status: Secondary | ICD-10-CM

## 2019-01-13 DIAGNOSIS — M19071 Primary osteoarthritis, right ankle and foot: Secondary | ICD-10-CM | POA: Diagnosis not present

## 2019-01-13 DIAGNOSIS — J1289 Other viral pneumonia: Secondary | ICD-10-CM | POA: Diagnosis not present

## 2019-01-13 DIAGNOSIS — M109 Gout, unspecified: Secondary | ICD-10-CM | POA: Diagnosis not present

## 2019-01-13 DIAGNOSIS — Z880 Allergy status to penicillin: Secondary | ICD-10-CM

## 2019-01-13 DIAGNOSIS — Z882 Allergy status to sulfonamides status: Secondary | ICD-10-CM

## 2019-01-13 DIAGNOSIS — Z794 Long term (current) use of insulin: Secondary | ICD-10-CM | POA: Diagnosis not present

## 2019-01-13 DIAGNOSIS — N182 Chronic kidney disease, stage 2 (mild): Secondary | ICD-10-CM | POA: Diagnosis present

## 2019-01-13 DIAGNOSIS — Z833 Family history of diabetes mellitus: Secondary | ICD-10-CM | POA: Diagnosis not present

## 2019-01-13 DIAGNOSIS — I10 Essential (primary) hypertension: Secondary | ICD-10-CM

## 2019-01-13 DIAGNOSIS — Z87442 Personal history of urinary calculi: Secondary | ICD-10-CM

## 2019-01-13 DIAGNOSIS — E1122 Type 2 diabetes mellitus with diabetic chronic kidney disease: Secondary | ICD-10-CM | POA: Diagnosis not present

## 2019-01-13 DIAGNOSIS — Z961 Presence of intraocular lens: Secondary | ICD-10-CM | POA: Diagnosis not present

## 2019-01-13 DIAGNOSIS — R519 Headache, unspecified: Secondary | ICD-10-CM | POA: Diagnosis present

## 2019-01-13 DIAGNOSIS — R7989 Other specified abnormal findings of blood chemistry: Secondary | ICD-10-CM

## 2019-01-13 DIAGNOSIS — Z66 Do not resuscitate: Secondary | ICD-10-CM | POA: Diagnosis present

## 2019-01-13 DIAGNOSIS — F419 Anxiety disorder, unspecified: Secondary | ICD-10-CM | POA: Diagnosis not present

## 2019-01-13 DIAGNOSIS — N179 Acute kidney failure, unspecified: Secondary | ICD-10-CM | POA: Diagnosis not present

## 2019-01-13 DIAGNOSIS — R0602 Shortness of breath: Secondary | ICD-10-CM | POA: Diagnosis not present

## 2019-01-13 DIAGNOSIS — E782 Mixed hyperlipidemia: Secondary | ICD-10-CM

## 2019-01-13 DIAGNOSIS — Z791 Long term (current) use of non-steroidal anti-inflammatories (NSAID): Secondary | ICD-10-CM

## 2019-01-13 DIAGNOSIS — E119 Type 2 diabetes mellitus without complications: Secondary | ICD-10-CM

## 2019-01-13 DIAGNOSIS — I2510 Atherosclerotic heart disease of native coronary artery without angina pectoris: Secondary | ICD-10-CM | POA: Diagnosis present

## 2019-01-13 DIAGNOSIS — Z951 Presence of aortocoronary bypass graft: Secondary | ICD-10-CM | POA: Diagnosis not present

## 2019-01-13 DIAGNOSIS — R778 Other specified abnormalities of plasma proteins: Secondary | ICD-10-CM | POA: Diagnosis not present

## 2019-01-13 DIAGNOSIS — I248 Other forms of acute ischemic heart disease: Secondary | ICD-10-CM | POA: Diagnosis not present

## 2019-01-13 DIAGNOSIS — Z888 Allergy status to other drugs, medicaments and biological substances status: Secondary | ICD-10-CM

## 2019-01-13 DIAGNOSIS — Z79899 Other long term (current) drug therapy: Secondary | ICD-10-CM

## 2019-01-13 LAB — BASIC METABOLIC PANEL
Anion gap: 9 (ref 5–15)
BUN: 33 mg/dL — ABNORMAL HIGH (ref 8–23)
CO2: 16 mmol/L — ABNORMAL LOW (ref 22–32)
Calcium: 8.4 mg/dL — ABNORMAL LOW (ref 8.9–10.3)
Chloride: 110 mmol/L (ref 98–111)
Creatinine, Ser: 1.54 mg/dL — ABNORMAL HIGH (ref 0.61–1.24)
GFR calc Af Amer: 53 mL/min — ABNORMAL LOW (ref >60)
GFR calc non Af Amer: 45 mL/min — ABNORMAL LOW (ref >60)
Glucose, Bld: 157 mg/dL — ABNORMAL HIGH (ref 70–99)
Potassium: 5 mmol/L (ref 3.5–5.1)
Sodium: 135 mmol/L (ref 135–145)

## 2019-01-13 LAB — CBC WITH DIFFERENTIAL/PLATELET
Abs Immature Granulocytes: 0.01 10*3/uL (ref 0.00–0.07)
Basophils Absolute: 0 10*3/uL (ref 0.0–0.1)
Basophils Relative: 0 %
Eosinophils Absolute: 0 10*3/uL (ref 0.0–0.5)
Eosinophils Relative: 0 %
HCT: 46 % (ref 39.0–52.0)
Hemoglobin: 15.6 g/dL (ref 13.0–17.0)
Immature Granulocytes: 0 %
Lymphocytes Relative: 15 %
Lymphs Abs: 0.7 10*3/uL (ref 0.7–4.0)
MCH: 31.2 pg (ref 26.0–34.0)
MCHC: 33.9 g/dL (ref 30.0–36.0)
MCV: 92 fL (ref 80.0–100.0)
Monocytes Absolute: 0.5 10*3/uL (ref 0.1–1.0)
Monocytes Relative: 12 %
Neutro Abs: 3.1 10*3/uL (ref 1.7–7.7)
Neutrophils Relative %: 73 %
Platelets: 147 10*3/uL — ABNORMAL LOW (ref 150–400)
RBC: 5 MIL/uL (ref 4.22–5.81)
RDW: 13 % (ref 11.5–15.5)
WBC: 4.4 10*3/uL (ref 4.0–10.5)
nRBC: 0 % (ref 0.0–0.2)

## 2019-01-13 LAB — PROCALCITONIN: Procalcitonin: <0.1 ng/mL

## 2019-01-13 LAB — HEPATIC FUNCTION PANEL
ALT: 27 U/L (ref 0–44)
AST: 53 U/L — ABNORMAL HIGH (ref 15–41)
Albumin: 2.4 g/dL — ABNORMAL LOW (ref 3.5–5.0)
Alkaline Phosphatase: 125 U/L (ref 38–126)
Bilirubin, Direct: 0.8 mg/dL — ABNORMAL HIGH (ref 0.0–0.2)
Indirect Bilirubin: 1.5 mg/dL — ABNORMAL HIGH (ref 0.3–0.9)
Total Bilirubin: 2.3 mg/dL — ABNORMAL HIGH (ref 0.3–1.2)
Total Protein: 5.1 g/dL — ABNORMAL LOW (ref 6.5–8.1)

## 2019-01-13 LAB — TROPONIN I (HIGH SENSITIVITY)
Troponin I (High Sensitivity): 149 ng/L (ref <18)
Troponin I (High Sensitivity): 147 ng/L (ref <18)

## 2019-01-13 LAB — LACTATE DEHYDROGENASE: LDH: 728 U/L — ABNORMAL HIGH (ref 98–192)

## 2019-01-13 LAB — GLUCOSE, CAPILLARY: Glucose-Capillary: 97 mg/dL (ref 70–99)

## 2019-01-13 LAB — TRIGLYCERIDES: Triglycerides: 39 mg/dL (ref <150)

## 2019-01-13 MED ORDER — INSULIN ASPART 100 UNIT/ML ~~LOC~~ SOLN
0.0000 [IU] | Freq: Every day | SUBCUTANEOUS | Status: DC
  Administered 2019-01-14: 3 [IU] via SUBCUTANEOUS

## 2019-01-13 MED ORDER — SODIUM CHLORIDE 0.9 % IV BOLUS
1000.0000 mL | Freq: Once | INTRAVENOUS | Status: AC
  Administered 2019-01-13: 1000 mL via INTRAVENOUS

## 2019-01-13 MED ORDER — ONDANSETRON HCL 4 MG PO TABS: 4.0000 mg | ORAL_TABLET | Freq: Four times a day (QID) | ORAL | Status: DC | PRN

## 2019-01-13 MED ORDER — ALLOPURINOL 100 MG PO TABS
300.0000 mg | ORAL_TABLET | Freq: Every day | ORAL | Status: DC
  Administered 2019-01-14 – 2019-01-15 (×2): 300 mg via ORAL
  Filled 2019-01-13 (×2): qty 3

## 2019-01-13 MED ORDER — PANTOPRAZOLE SODIUM 40 MG PO TBEC
40.0000 mg | DELAYED_RELEASE_TABLET | Freq: Every day | ORAL | Status: DC
  Administered 2019-01-14 – 2019-01-15 (×2): 40 mg via ORAL
  Filled 2019-01-13 (×2): qty 1

## 2019-01-13 MED ORDER — ENSURE ENLIVE PO LIQD
237.0000 mL | Freq: Two times a day (BID) | ORAL | Status: DC
  Administered 2019-01-14 – 2019-01-15 (×2): 237 mL via ORAL

## 2019-01-13 MED ORDER — INSULIN GLARGINE 100 UNIT/ML ~~LOC~~ SOLN
100.0000 [IU] | Freq: Every day | SUBCUTANEOUS | Status: DC
  Administered 2019-01-14 – 2019-01-15 (×2): 100 [IU] via SUBCUTANEOUS
  Filled 2019-01-13 (×2): qty 1

## 2019-01-13 MED ORDER — ASPIRIN EC 81 MG PO TBEC
81.0000 mg | DELAYED_RELEASE_TABLET | Freq: Every morning | ORAL | Status: DC
  Administered 2019-01-14 – 2019-01-15 (×2): 81 mg via ORAL
  Filled 2019-01-13 (×2): qty 1

## 2019-01-13 MED ORDER — SODIUM CHLORIDE 0.9 % IV SOLN
INTRAVENOUS | Status: DC
  Administered 2019-01-13: 1000 mL via INTRAVENOUS
  Administered 2019-01-14: 12:00:00 via INTRAVENOUS

## 2019-01-13 MED ORDER — TAMSULOSIN HCL 0.4 MG PO CAPS
0.4000 mg | ORAL_CAPSULE | Freq: Every day | ORAL | Status: DC
  Administered 2019-01-14: 0.4 mg via ORAL
  Filled 2019-01-13: qty 1

## 2019-01-13 MED ORDER — INSULIN ASPART 100 UNIT/ML ~~LOC~~ SOLN
0.0000 [IU] | Freq: Three times a day (TID) | SUBCUTANEOUS | Status: DC
  Administered 2019-01-14: 3 [IU] via SUBCUTANEOUS
  Administered 2019-01-15: 2 [IU] via SUBCUTANEOUS
  Administered 2019-01-15: 3 [IU] via SUBCUTANEOUS

## 2019-01-13 MED ORDER — LORAZEPAM 1 MG PO TABS
1.0000 mg | ORAL_TABLET | Freq: Once | ORAL | Status: AC
Start: 2019-01-13 — End: 2019-01-13
  Administered 2019-01-13: 1 mg via ORAL
  Filled 2019-01-13: qty 1

## 2019-01-13 MED ORDER — ATORVASTATIN CALCIUM 40 MG PO TABS
40.0000 mg | ORAL_TABLET | Freq: Every day | ORAL | Status: DC
  Administered 2019-01-14: 40 mg via ORAL
  Filled 2019-01-13: qty 1

## 2019-01-13 MED ORDER — LORAZEPAM 0.5 MG PO TABS
0.5000 mg | ORAL_TABLET | Freq: Every day | ORAL | Status: DC | PRN
  Administered 2019-01-14 (×2): 0.5 mg via ORAL
  Filled 2019-01-13 (×2): qty 1

## 2019-01-13 MED ORDER — GUAIFENESIN-DM 100-10 MG/5ML PO SYRP
10.0000 mL | ORAL_SOLUTION | ORAL | Status: DC | PRN
  Administered 2019-01-14 – 2019-01-15 (×3): 10 mL via ORAL
  Filled 2019-01-13 (×3): qty 10

## 2019-01-13 MED ORDER — ONDANSETRON HCL 4 MG/2ML IJ SOLN: 4.0000 mg | Freq: Four times a day (QID) | INTRAMUSCULAR | Status: DC | PRN

## 2019-01-13 MED ORDER — SODIUM CHLORIDE 0.9% FLUSH
3.0000 mL | Freq: Two times a day (BID) | INTRAVENOUS | Status: DC
  Administered 2019-01-13 – 2019-01-15 (×4): 3 mL via INTRAVENOUS

## 2019-01-13 MED ORDER — ENOXAPARIN SODIUM 60 MG/0.6ML ~~LOC~~ SOLN
55.0000 mg | Freq: Every day | SUBCUTANEOUS | Status: DC
  Administered 2019-01-14 (×2): 55 mg via SUBCUTANEOUS
  Filled 2019-01-13 (×2): qty 0.6

## 2019-01-13 MED ORDER — ACETAMINOPHEN 325 MG PO TABS
650.0000 mg | ORAL_TABLET | Freq: Four times a day (QID) | ORAL | Status: DC | PRN
  Administered 2019-01-14 (×3): 650 mg via ORAL
  Filled 2019-01-13 (×3): qty 2

## 2019-01-13 MED ORDER — INFLUENZA VAC A&B SA ADJ QUAD 0.5 ML IM PRSY
0.5000 mL | PREFILLED_SYRINGE | INTRAMUSCULAR | Status: DC
  Filled 2019-01-13: qty 0.5

## 2019-01-13 MED ORDER — BISOPROLOL FUMARATE 10 MG PO TABS
10.0000 mg | ORAL_TABLET | Freq: Every day | ORAL | Status: DC
  Administered 2019-01-14 – 2019-01-15 (×2): 10 mg via ORAL
  Filled 2019-01-13 (×3): qty 1

## 2019-01-13 MED ORDER — HYDROCODONE-ACETAMINOPHEN 5-325 MG PO TABS: 1.0000 | ORAL_TABLET | ORAL | Status: DC | PRN

## 2019-01-13 NOTE — ED Notes (Signed)
Unsuccessful IV attempt x 3 

## 2019-01-13 NOTE — ED Triage Notes (Signed)
Pt  arrived via EMS from home. Pt reports that he tested positive  on Monday and was instructed by PCP to come to ED if he is feeling worse,   C/o Increased fatigue,  loss of appetite, SOB with exertion, denies any fevers,     136/78, HR 68, RR 18, O2 97% RA

## 2019-01-13 NOTE — Plan of Care (Signed)

## 2019-01-13 NOTE — ED Notes (Signed)
Date and time results received: 01/13/19 1649 (use smartphrase ".now" to insert current time)  Test: Troponin Critical Value: 149  Name of Provider Notified: PA Wylder  Orders Received? Or Actions Taken?: Orders Received - See Orders for details

## 2019-01-13 NOTE — ED Provider Notes (Signed)
Accepted handoff at shift change from [name]. Please see prior provider note for more detail.   S: 68 y.o. male with history of CAD with quadruple bypass, DM, HTN, HLD presenting 5 days post positive Covid test with chest congestion, decreased taste and smell, aches, cough.  Fevers and chills which have improved.  Patient was given last pain prescription from his PCP for his ID due to diagnosis of COVID-19.  PE/labs: Patient notably anxious.  Otherwise well-appearing per prior PE note.  DDX: concern for ACS, COVID-19 symptoms other viral illness,  Plan: Due to patient's extensive cardiac history will obtain labs for cardiac work-up including EKG, and troponins.  Chest x-ray pending.   Cardiac work-up is WNL will do ambulatory pulse ox challenge patient has no new oxygen requirement discharge home with prednisone fevers.      Physical Exam  BP 139/75 (BP Location: Right Arm)   Pulse (!) 56   Temp 99.1 F (37.3 C) (Oral)   Resp 20   SpO2 100%   CONSTITUTIONAL:  well-appearing, NAD NEURO:  Alert and oriented x 3, no focal deficits EYES:  pupils equal and reactive ENT/NECK:  trachea midline, no JVD CARDIO:  reg rate, reg rhythm, well-perfused PULM:  None labored breathing GI/GU:  Abdomen non-distended MSK/SPINE:  No gross deformities, no edema SKIN:  no rash, atraumatic PSYCH:  Appropriate speech and behavior   ED Course/Procedures     Procedures  Labs Reviewed  CBC WITH DIFFERENTIAL/PLATELET - Abnormal; Notable for the following components:      Result Value   Platelets 147 (*)    All other components within normal limits  BASIC METABOLIC PANEL - Abnormal; Notable for the following components:   CO2 16 (*)    Glucose, Bld 157 (*)    BUN 33 (*)    Creatinine, Ser 1.54 (*)    Calcium 8.4 (*)    GFR calc non Af Amer 45 (*)    GFR calc Af Amer 53 (*)    All other components within normal limits  TROPONIN I (HIGH SENSITIVITY) - Abnormal; Notable for the  following components:   Troponin I (High Sensitivity) 149 (*)    All other components within normal limits  TROPONIN I (HIGH SENSITIVITY)    Dg Chest Portable 1 View  Result Date: 01/13/2019 CLINICAL DATA:  COVID-19 positive, chest congestion. EXAM: PORTABLE CHEST 1 VIEW COMPARISON:  October 08, 2015. FINDINGS: The heart size and mediastinal contours are within normal limits. Status post coronary bypass graft. Hypoinflation of the lungs is noted with minimal bibasilar subsegmental atelectasis. No pneumothorax or pleural effusion is noted. The visualized skeletal structures are unremarkable. IMPRESSION: Hypoinflation of the lungs is noted with minimal bibasilar subsegmental atelectasis. Electronically Signed   By: Marijo Conception M.D.   On: 01/13/2019 15:18       MDM  Patient with initial troponin 149 EKG x2 interpreted by myself and Dr. Sedonia Small was nonischemic.  Patient currently with some chest pressure no chest pain not short of breath is experiencing his usual anxiety attack which she states is much improved after lorazepam administration.  Denies any radiation of pain states that chest pain is not present and chest pressure is resolved almost entirely.  I discussed this case with my attending physician who cosigned this note including patient's presenting symptoms, physical exam, and planned diagnostics and interventions. Attending physician stated agreement with plan or made changes to plan which were implemented.   Concern for patient high risk  for cardiac disease will admit patient to [facility].  Discussed with patient and wife over the phone  Discussed with Dr. Roel Cluck who agrees to event patient would like additional laceration ordered.  Requested cardiology consult before transfer to [place].   Discussed case with Dr. Domenic Polite who recommended echo be done during hospitalization to rule out cardiomyopathy.  States that this echocardiogram can be done at [facility].  Message Dr.  Roel Cluck over epic messaging system where cardiology recommendations.  Note pending.  Care transition to Dr. Roel Cluck at this time. 7:30 PM   Mitchell Lambert was evaluated in Emergency Department on 01/13/2019 for the symptoms described in the history of present illness. He was evaluated in the context of the global COVID-19 pandemic, which necessitated consideration that the patient might be at risk for infection with the SARS-CoV-2 virus that causes COVID-19. Institutional protocols and algorithms that pertain to the evaluation of patients at risk for COVID-19 are in a state of rapid change based on information released by regulatory bodies including the CDC and federal and state organizations. These policies and algorithms were followed during the patient's care in the ED.     Mitchell Lambert, [place] 01/13/19 1930    Maudie Flakes, MD 01/14/19 [phone]

## 2019-01-13 NOTE — ED Notes (Signed)
Unable to draw all labs, IV access not drawing back.

## 2019-01-13 NOTE — ED Provider Notes (Signed)
[facility] DEPT Provider Note   CSN: [id number] Arrival date & time: 01/13/19  1245     History   Chief Complaint Chief Complaint  Patient presents with  . COVID positive  . Fatigue  . Anorexia    HPI Mitchell Lambert is a 68 y.o. male with PMH significant for DM, quadruple bypass, HTN, CAD, and HLD who presents to the ED for worsening chest congestion.  Patient was diagnosed COVID-19 positive 5 days ago and has been symptomatic for 9 days.  He presents to the ED due to concerns over his chest congestion given his previous quadruple bypass.  He also reports that he has had diminished appetite due to his loss of taste and smell and has not consumed much food or liquid since symptom onset.  He has been taking approximately 500 mg Tylenol and 250 mg ibuprofen each day, with some relief.  He reports that his fevers and chills have subsided for the most part, but he continues to endorse body aches, chest congestion, and mild cough.  His PCP is also prescribe him lorazepam for his anxiety over his COVID-19 diagnosis.  He denies any history of clots, clotting disorder, cancer, immobilization, or recent unilateral leg swelling.  Also denies any overt respiratory distress, uncontrolled nausea or vomiting, squeezing chest pain, diaphoresis, headache, dizziness, or other symptoms.    HPI  Past Medical History:  Diagnosis Date  . Angina    NO ANGINA SINCE CABG  . Arthritis    right ankle  . Chronic daily headache    "@ least every other day""improved since heart surgery"  . Coronary artery disease    PT STATES HEART DOING WELL SINCE CABG SURGERY - DR. TILLEY IS HIS CARDIOLOGIST  . Diabetes mellitus    Lantus x 10 yrs  . Gout   . History of kidney stones 09-08-12   multiple times with some lithotripsies  . Hyperlipemia   . Hypertension     Patient Active Problem List   Diagnosis Date Noted  . S/P CABG (coronary artery bypass graft)   . CAD (coronary  artery disease) 04/28/2011  . Insulin dependent type 2 diabetes mellitus, controlled ([place])   . Hyperlipidemia   . Hypertension   . Obesity (BMI 30-39.9)   . Nephrolithiasis, uric acid     Past Surgical History:  Procedure Laterality Date  . BACK SURGERY     microsurgery: spinal stenosis -lumbar  . bone spur  1990's   right great toe; "probably related to gout"  . CARDIAC CATHETERIZATION  04/27/11  . CATARACT EXTRACTION W/ INTRAOCULAR LENS IMPLANT  1990's   right; "lens was replaced twice over 3 months; lens is actually over iris"  . CORONARY ARTERY BYPASS GRAFT  04/30/2011   Procedure: CORONARY ARTERY BYPASS GRAFTING (CABG);  Surgeon: Melrose Nakayama, MD;  Location: [facility];  Service: Open Heart Surgery;  Laterality: N/A;,x4 vessels  . CYSTOSCOPY W/ URETERAL STENT PLACEMENT Right 08/19/2012   Procedure: CYSTOSCOPY WITH RETROGRADE PYELOGRAM/URETERAL STENT PLACEMENT;  Surgeon: Molli Hazard, MD;  Location: [facility];  Service: Urology;  Laterality: Right;  . CYSTOSCOPY WITH RETROGRADE PYELOGRAM, URETEROSCOPY AND STENT PLACEMENT Left 07/17/2013   Procedure: CYSTOSCOPY, LEFT URETEROSCOPY, BASKET EXTRACTION OF STONE, INSERTION OF LEFT URETERAL STENT, ;  Surgeon: Jorja Loa, MD;  Location: [facility];  Service: Urology;  Laterality: Left;  . CYSTOSCOPY/RETROGRADE/URETEROSCOPY Right 09/12/2012   Procedure: CYSTOSCOPY/RETROGRADE/URETEROSCOPY;  Surgeon: Franchot Gallo, MD;  Location: [facility];  Service: Urology;  Laterality: Right;  . HOLMIUM LASER APPLICATION Right [id number]   Procedure: HOLMIUM LASER APPLICATION;  Surgeon: Franchot Gallo, MD;  Location: [facility];  Service: Urology;  Laterality: Right;  . HOLMIUM LASER APPLICATION Left [id number]   Procedure: HOLMIUM LASER APPLICATION;  Surgeon: Jorja Loa, MD;  Location: [facility];  Service: Urology;  Laterality: Left;  . KIDNEY STONE SURGERY     "probably 3 times"  . LEFT HEART CATHETERIZATION WITH CORONARY ANGIOGRAM N/A  04/28/2011   Procedure: LEFT HEART CATHETERIZATION WITH CORONARY ANGIOGRAM;  Surgeon: Jacolyn Reedy, MD;  Location: [facility] C[facility];  Service: Cardiovascular;  Laterality: N/A;  . LITHOTRIPSY     "many; probably 5 times"  . RADIAL ARTERY HARVEST  04/30/2011   Procedure: RADIAL ARTERY HARVEST;  Surgeon: Melrose Nakayama, MD;  Location: [facility];  Service: Open Heart Surgery;  Laterality: Left;  . RETINAL DETACHMENT SURGERY  1990's   right eye; "made me have an early cataract"  . VASECTOMY          Home Medications    Prior to Admission medications   Medication Sig Start Date End Date Taking? Authorizing Provider  ACCU-CHEK GUIDE test strip USE TO TEST BLOOD SUGAR 2 TO 3 TIMES A DAY 03/18/17   [provider]  allopurinol (ZYLOPRIM) 300 MG tablet Take 300 mg by mouth daily.    [provider]  aspirin EC 81 MG tablet Take 81 mg by mouth every morning.     [provider]  atorvastatin (LIPITOR) 40 MG tablet Take 40 mg by mouth daily.    [provider]  B-D ULTRAFINE III SHORT PEN 31G X 8 MM MISC USE UTD WITH FLEXPEN. 03/18/17   [provider]  Biotin 5000 MCG TABS Take 5,000 mcg by mouth daily.     [provider]  bisoprolol (ZEBETA) 5 MG tablet TK 1 T PO QD 04/26/17   [provider]  Cholecalciferol (VITAMIN D3) 5000 UNITS TABS Take 5,000 Units by mouth daily.     [provider]  ciprofloxacin (CIPRO) 250 MG tablet Take 1 tablet (250 mg total) by mouth 2 (two) times daily. 07/17/13   Franchot Gallo, MD  fish oil-omega-3 fatty acids 1000 MG capsule Take 1 g by mouth 2 (two) times daily.    [provider]  glucosamine-chondroitin 500-400 MG tablet Take 2 tablets by mouth daily.    [provider]  HYDROcodone-acetaminophen (NORCO) 5-325 MG tablet Take 1 tablet by mouth every 6 (six) hours as needed. 03/31/15   Jaynee Eagles, PA-C  ibuprofen (ADVIL,MOTRIN) 200 MG tablet Take 400 mg by mouth every 8  (eight) hours as needed for moderate pain.     [provider]  Insulin Glargine (BASAGLAR KWIKPEN) 100 UNIT/ML SOPN Inject 100 Units into the skin daily.    [provider]  L-Lysine HCl 1000 MG TABS Take 1 tablet by mouth daily.    [provider]  magnesium oxide (MAG-OX) 400 MG tablet Take 400 mg by mouth 2 (two) times daily.     [provider]  naproxen (NAPROSYN) 500 MG tablet Take 500 mg by mouth daily.    [provider]  NOVOLOG FLEXPEN 100 UNIT/ML FlexPen INJECT 6 TO 12 UNITS INTO THE SKIN UTD BEFORE A MEAL 01/31/17   [provider]  omeprazole (PRILOSEC) 10 MG capsule Take 10 mg by mouth daily.    [provider]  simvastatin (ZOCOR) 40 MG tablet Take 40 mg by  mouth every morning. Reported on 03/31/2015    [provider]  vitamin C (ASCORBIC ACID) 500 MG tablet Take 1,000 mg by mouth daily.     [provider]  VITAMIN E PO Take 1 capsule by mouth daily.    [provider]    Family History Family History  Problem Relation Age of Onset  . Cataracts Father   . Cataracts Mother   . Diabetes Paternal Aunt   . Diabetes Paternal Grandmother   . Amblyopia Neg Hx   . Blindness Neg Hx   . Glaucoma Neg Hx   . Macular degeneration Neg Hx   . Retinal detachment Neg Hx   . Strabismus Neg Hx   . Retinitis pigmentosa Neg Hx     Social History Social History   Tobacco Use  . Smoking status: Never Smoker  . Smokeless tobacco: Never Used  Substance Use Topics  . Alcohol use: Yes    Comment:  OCCAS BEER  OR MIXED DRINK LAST MARIJUANA COUPLE MONTHS AGO  . Drug use: Yes    Types: Marijuana    Comment: last marijuana use ~ 2010; "very rare". since     Allergies   Gabapentin, Metformin and related, Sulfa antibiotics, Amoxicillin, Lisinopril, and Doxycycline   Review of Systems Review of Systems  All other systems reviewed and are negative.    Physical Exam Updated Vital Signs BP  139/75 (BP Location: Right Arm)   Pulse (!) 56   Temp 99.1 F (37.3 C) (Oral)   Resp 20   SpO2 100%   Physical Exam Vitals signs and nursing note reviewed. Exam conducted with a chaperone present.  Constitutional:      Appearance: Normal appearance. He is not toxic-appearing or diaphoretic.  HENT:     Head: Normocephalic and atraumatic.     Nose: Nose normal.  Eyes:     General: No scleral icterus.    Conjunctiva/sclera: Conjunctivae normal.  Neck:     Musculoskeletal: Normal range of motion and neck supple. No neck rigidity.  Cardiovascular:     Rate and Rhythm: Normal rate and regular rhythm.     Pulses: Normal pulses.     Heart sounds: Normal heart sounds.  Pulmonary:     Effort: Pulmonary effort is normal. No respiratory distress.     Breath sounds: No wheezing.  Abdominal:     General: Abdomen is flat. There is no distension.     Palpations: Abdomen is soft.     Tenderness: There is no abdominal tenderness. There is no guarding.  Musculoskeletal: Normal range of motion.     Right lower leg: No edema.     Left lower leg: No edema.  Skin:    General: Skin is dry.  Neurological:     Mental Status: He is alert.     GCS: GCS eye subscore is 4. GCS verbal subscore is 5. GCS motor subscore is 6.  Psychiatric:        Mood and Affect: Mood normal.        Behavior: Behavior normal.        Thought Content: Thought content normal.     Comments: Anxious.      ED Treatments / Results  Labs (all labs ordered are listed, but only abnormal results are displayed) Labs Reviewed  CBC WITH DIFFERENTIAL/PLATELET  BASIC METABOLIC PANEL  TROPONIN I (HIGH SENSITIVITY)    EKG None  Radiology Dg Chest Portable 1 View  Result Date: 01/13/2019 CLINICAL DATA:  COVID-19 positive, chest congestion. EXAM: PORTABLE CHEST 1 VIEW COMPARISON:  October 08, 2015. FINDINGS: The heart size and mediastinal contours are within normal limits. Status post coronary bypass graft. Hypoinflation of  the lungs is noted with minimal bibasilar subsegmental atelectasis. No pneumothorax or pleural effusion is noted. The visualized skeletal structures are unremarkable. IMPRESSION: Hypoinflation of the lungs is noted with minimal bibasilar subsegmental atelectasis. Electronically Signed   By: Marijo Conception M.D.   On: 01/13/2019 15:18    Procedures Procedures (including critical care time)  Medications Ordered in ED Medications  sodium chloride 0.9 % bolus 1,000 mL (1,000 mLs Intravenous Bolus 01/13/19 1440)     Initial Impression / Assessment and Plan / ED Course  I have reviewed the triage vital signs and the nursing notes.  Pertinent labs & imaging results that were available during my care of the patient were reviewed by me and considered in my medical decision making (see chart for details).        Patient would be PERC negative if not for his age and have low suspicion for pulmonary embolism at this time.  He endorses mild pleuritic chest discomfort, particularly worse with coughing, but denies history of squeezing chest pain that would otherwise be concerning for ACS.  He takes 1 baby aspirin each day.  His quadruple bypass was performed before he ever endured a myocardial infarction.  He denies any diaphoresis, nausea or vomiting, or exertional chest pain that might suggest ACS.  Given patient's diminished p.o. intake, will provide 1 L normal saline over concerns of dehydration.  We will obtain portable DG chest to assess for pneumonia and then will have patient ambulate on pulse oximetry to assess for new onset hypoxia.  DG chest was interpreted and demonstrates mild bibasilar atelectasis, no significant effusions or consolidation.  AZURIAH DESOCIO was evaluated in Emergency Department on 01/13/2019 for the symptoms described in the history of present illness. He was evaluated in the context of the global COVID-19 pandemic, which necessitated consideration that the patient might be  at risk for infection with the SARS-CoV-2 virus that causes COVID-19. Institutional protocols and algorithms that pertain to the evaluation of patients at risk for COVID-19 are in a state of rapid change based on information released by regulatory bodies including the CDC and federal and state organizations. These policies and algorithms were followed during the patient's care in the ED.   At shift change care was transferred to [facility], PA-C who will follow pending studies, re-evaluate, and determine disposition.      Final Clinical Impressions(s) / ED Diagnoses   Final diagnoses:  [id number]    ED Discharge Orders    None       Corena Herter, PA-C 01/13/19 1603    Lacretia Leigh, MD 01/16/19 [phone]

## 2019-01-13 NOTE — Progress Notes (Signed)
   Progress Note  Patient Name: Mitchell Lambert Date of Encounter: 01/13/2019  Primary Cardiologist: Quay Burow, MD  Called my Mr. Lavone Orn PA-C per Dr. Roel Cluck regarding recommendation for appropriate place of admission with SARS coronavirus 2 positive diagnosis in the setting of mildly abnormal high-sensitivity troponin I levels.  Mr. Boegel follows with Dr. Gwenlyn Found, was seen in the office in February of multivessel CAD status post CABG in 2013 (former patient of Dr. Wynonia Lawman).  That time, reportedly with history of atypical chest pain a few times a year on medical therapy.  Patient presents now to the ER with approximately 5 days of chest congestion and tightness, decreased taste and smell, diffuse body aches and cough with active COVID-19 diagnosis.  As part of his work-up high-sensitivity troponin I levels were obtained and found to be 149 and 147 respectively.  Additional lab work shows potassium 5.0, BUN 33, creatinine 1.54, hemoglobin 15.6, platelets 147.  Chest x-ray shows bibasilar atelectasis without obvious infiltrates.  I personally reviewed his ECG which shows sinus rhythm with possible old inferior infarct pattern and overall nonspecific ST-T changes.  Vital signs show systolics in the [id number] to 0000000, heart rate in the 50s to 60s in sinus rhythm.  Recent temperature 99.1 degrees.  Patient is awaiting bed, possibly at the [facility].  I would suggest cycling additional cardiac markers, if trend remains flat, do not necessarily suspect ACS and therefore [facility] admission is most likely appropriate.  He does need an echocardiogram however to investigate possible myocarditis/cardiomyopathy.  If cardiac enzymes trend more consistently into ACS range or he does have evidence of cardiomyopathy, cardiology can certainly be formally consulted.  Signed, Rozann Lesches, MD  01/13/2019, 7:21 PM

## 2019-01-13 NOTE — ED Notes (Addendum)
Provider at bedside

## 2019-01-13 NOTE — H&P (Signed)
Mitchell Lambert [id number] DOB: 1950-06-20 DOA: 01/13/2019     PCP: Lawerance Cruel, MD   Outpatient Specialists:   CARDS:   Dr. Gwenlyn Found   Patient arrived to ER on 01/13/19 at 1245  Patient coming from: home Lives With family    Chief Complaint:   Chief Complaint  Patient presents with  . COVID positive  . Fatigue  . Anorexia    HPI: Mitchell Lambert is a 68 y.o. male with medical history significant of CAD sp CABG 2013, HLD, HTN, GOUT, kidney stones    Presented with  Initially diagnosed with COVID 9 days ago he had decreased PO intake felt dehydrated reports decreased taste. No fever Reports body aches and headaches Reports cough but no severe SOB reports some chest pressure worse with exertion any exertion makes him cough and makes him feel short of breath No leg swelling No chest pain with inspiration He works and have had people at work who possibly had [place] He work in Paediatric nurse for [employer]  Infectious risk factors:  Reports shortness of breath, dry cough ,URI symptoms, anosmia/change in taste,  Diarrhea  Body aches, severe fatigue sick contacts,     Outside test is positive      In  ER RAPID COVID TEST  in house testing  Pending  No results found for: SARSCOV2NAA   Regarding pertinent Chronic problems:     Hyperlipidemia -  on statins lipitor   HTN on bisoprolol   CHF diastolic  - last echo 123456 EF 55-60% Grade 1 DDx     CAD  - On Aspirin, statin, betablocker,                 -  followed by cardiology                - last cardiac cath  He did undergo cardiac catheterization because of chest pain Dr. Wynonia Lawman February 2013 revealing three-vessel disease with preserved LV function and underwent CABG x4 by Dr. Roxan Hockey 04/28/2011 with a LIMA to the LAD, left radial to OM1, vein to diagonal branch and PDA.  He apparently had a Myoview stress test sometime after which which was negative.   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 Blood Culture (routine x 2)  . DG Chest Portable 1 View  . CBC with Differential  . Basic metabolic panel  . Lactic acid, plasma  . D-dimer, quantitative  . Procalcitonin  . Lactate dehydrogenase  . Ferritin  . Triglycerides  . Fibrinogen  . C-reactive protein  . Hepatic function panel  . Diet NPO time specified  . Cardiac monitoring  . Insert peripheral IV x 2  . Initiate Carrier Fluid Protocol  . Place surgical mask on patient  . Patient to wear surgical mask during transportation  . Assess patient for ability to self-prone. If able (can move self in bed, ambulate) and stable (SpO2 and oxygen requirement):  . RN/NT -  Document specific oxygen requirements in CHL  . Notify EDP if new oxygen requirements escalates > 4L per minute [place]  . RN to draw the following extra tubes:  . Consult to hospitalist  ALL PATIENTS BEING ADMITTED/HAVING PROCEDURES NEED COVID-19 SCREENING  . Inpatient consult to Cardiology  ALL PATIENTS BEING ADMITTED/HAVING PROCEDURES NEED COVID-19 SCREENING  . Airborne and Contact precautions  . Pulse oximetry, continuous  . ED EKG  . EKG 12-Lead  . Repeat EKG  . EKG 12-Lead     Following Medications were ordered in ER: Medications  sodium chloride 0.9 % bolus 1,000 mL (1,000 mLs Intravenous Bolus 01/13/19 1440)  LORazepam (ATIVAN) tablet 1 mg (1 mg Oral Given 01/13/19 1706)        Consult Orders  (From admission, onward)         Start     Ordered   01/13/19 1728  Consult to hospitalist  ALL PATIENTS BEING ADMITTED/HAVING PROCEDURES NEED COVID-19 SCREENING   Once    Comments: ALL PATIENTS BEING ADMITTED/HAVING PROCEDURES NEED COVID-19 SCREENING  Provider:  (Not yet assigned)  Question Answer Comment  Place call to: [facility]   Reason for Consult Admit      01/13/19 1731          ER Provider Called:   cardiology  Dr. Domenic Polite  They Recommend admit to medicine  To [place] and obtain Echo    Significant initial  Findings: Abnormal Labs Reviewed  CBC WITH DIFFERENTIAL/PLATELET - Abnormal; Notable for the following components:      Result Value   Platelets 147 (*)    All other components within normal limits  BASIC METABOLIC PANEL - Abnormal; Notable for the following components:   CO2 16 (*)    Glucose, Bld 157 (*)    BUN 33 (*)    Creatinine, Ser 1.54 (*)    Calcium 8.4 (*)    GFR calc non Af Amer 45 (*)    GFR calc Af Amer 53 (*)    All other components within normal limits  TROPONIN I (HIGH SENSITIVITY) - Abnormal; Notable for the following components:   Troponin I (High Sensitivity) 149 (*)    All other components within normal limits  TROPONIN I (HIGH SENSITIVITY) - Abnormal; Notable for the following components:   Troponin I (High Sensitivity) 147 (*)    All other components within normal limits    Otherwise labs showing:    Recent Labs  Lab 01/13/19 1345  NA 135  K 5.0  CO2 16*  GLUCOSE 157*  BUN 33*  CREATININE 1.54*  CALCIUM 8.4*    Cr     Up from baseline see below Lab Results  Component Value Date   CREATININE 1.54 (H) 01/13/2019   CREATININE 1.25 03/31/2015   CREATININE 1.28 04/27/2014    No results for input(s): AST, ALT, ALKPHOS, BILITOT, PROT, ALBUMIN in the last 168 hours. Lab Results  Component Value Date   CALCIUM 8.4 (L) 01/13/2019     WBC       Component Value Date/Time   WBC 4.4 01/13/2019 1345   ANC    Component Value Date/Time   NEUTROABS 3.1 01/13/2019 1345   ALC 0.7  Plt: Lab Results  Component Value Date   PLT 147 (L) 01/13/2019     Procalcitonin   Ordered    COVID-19 Labs  No results for input(s): DDIMER, FERRITIN, LDH, CRP in the last 72 hours.  No results found for: SARSCOV2NAA   HG/HCT  stable,      Component Value Date/Time   HGB 15.6 01/13/2019 1345   HCT 46.0 01/13/2019 1345    Troponin 147-149 ordered   ECG: Ordered Personally reviewed by me showing: HR : 69 Rhythm:  NSR  no evidence of ischemic changes QTC 416      UA  ordered      Ordered   CXR - Hypoinflation of the lungs is noted with minimal bibasilar subsegmental atelectasis.    ED Triage Vitals  Enc Vitals Group     BP 01/13/19 1357 139/75     Pulse Rate 01/13/19 1357 (!) 56     Resp 01/13/19 1357 20     Temp 01/13/19 1357 99.1 F (37.3 C)     Temp Source 01/13/19 1357 Oral     SpO2 01/13/19 1357 100 %     Weight --      Height --      Head Circumference --      Peak Flow --      Pain Score 01/13/19 1359 6     Pain Loc --      Pain Edu? --      Excl. in [place]? --   TMAX(24)@       Latest  Blood pressure (!) 162/68, pulse (!) 35, temperature 99.1 F (37.3 C), temperature source Oral, resp. rate (!) 22, SpO2 96 %.    Hospitalist was called for admission for Covid viral infection   Review of Systems:    Pertinent positives include:  fatigue, shortness of breath at rest dyspnea on exertion,non-productive cough,  Constitutional:  No weight loss, night sweats, Fevers, chills, weight loss  HEENT:  No headaches, Difficulty swallowing,Tooth/dental problems,Sore throat,  No sneezing, itching, ear ache, nasal congestion, post nasal drip,  Cardio-vascular:  No chest pain, Orthopnea, PND, anasarca, dizziness, palpitations.no Bilateral lower extremity swelling  GI:  No heartburn, indigestion, abdominal pain, nausea, vomiting, diarrhea, change in bowel habits, loss of appetite, melena, blood in stool, hematemesis Resp:    No excess mucus, no productive cough, No No coughing up of blood.No change in color of mucus.No wheezing. Skin:  no rash or lesions.  No jaundice GU:  no dysuria, change in color of urine, no urgency or frequency. No straining to urinate.  No flank pain.  Musculoskeletal:  No joint pain or no joint swelling. No decreased range of motion. No back pain.  Psych:  No change in mood or affect. No depression or anxiety. No memory loss.  Neuro: no localizing neurological complaints, no tingling, no weakness, no double vision, no gait abnormality, no slurred speech, no confusion  All systems reviewed and apart from [place] all are negative  Past Medical History:   Past Medical History:  Diagnosis Date  . Angina    NO ANGINA SINCE CABG  . Arthritis    right ankle  . Chronic daily headache    "@ least every other day""improved since heart surgery"  . Coronary artery disease    PT STATES HEART DOING WELL SINCE CABG SURGERY - DR. TILLEY IS HIS CARDIOLOGIST  . Diabetes mellitus    Lantus x 10 yrs  . Gout   . History of kidney stones 09-08-12   multiple times with some lithotripsies  . Hyperlipemia   . Hypertension      Past Surgical History:  Procedure Laterality Date  . BACK SURGERY     microsurgery: spinal stenosis -lumbar  . bone spur  1990's   right great  toe; "probably related to gout"  . CARDIAC CATHETERIZATION  04/27/11  . CATARACT EXTRACTION W/ INTRAOCULAR LENS IMPLANT  1990's   right; "lens was replaced twice over 3 months; lens is actually over iris"  . CORONARY ARTERY BYPASS GRAFT  04/30/2011   Procedure: CORONARY ARTERY BYPASS GRAFTING (CABG);  Surgeon: Melrose Nakayama, MD;  Location: [facility];  Service: Open Heart Surgery;  Laterality: N/A;,x4 vessels  . CYSTOSCOPY W/ URETERAL STENT PLACEMENT Right 08/19/2012   Procedure: CYSTOSCOPY WITH RETROGRADE PYELOGRAM/URETERAL STENT PLACEMENT;  Surgeon: Molli Hazard, MD;  Location: [facility];  Service: Urology;  Laterality: Right;  . CYSTOSCOPY WITH RETROGRADE PYELOGRAM, URETEROSCOPY AND STENT PLACEMENT Left 07/17/2013   Procedure: CYSTOSCOPY, LEFT  URETEROSCOPY, BASKET EXTRACTION OF STONE, INSERTION OF LEFT URETERAL STENT, ;  Surgeon: Jorja Loa, MD;  Location: [facility];  Service: Urology;  Laterality: Left;  . CYSTOSCOPY/RETROGRADE/URETEROSCOPY Right 09/12/2012   Procedure: CYSTOSCOPY/RETROGRADE/URETEROSCOPY;  Surgeon: Franchot Gallo, MD;  Location: [facility];  Service: Urology;  Laterality: Right;  . HOLMIUM LASER APPLICATION Right [id number]   Procedure: HOLMIUM LASER APPLICATION;  Surgeon: Franchot Gallo, MD;  Location: [facility];  Service: Urology;  Laterality: Right;  . HOLMIUM LASER APPLICATION Left [id number]   Procedure: HOLMIUM LASER APPLICATION;  Surgeon: Jorja Loa, MD;  Location: [facility];  Service: Urology;  Laterality: Left;  . KIDNEY STONE SURGERY     "probably 3 times"  . LEFT HEART CATHETERIZATION WITH CORONARY ANGIOGRAM N/A 04/28/2011   Procedure: LEFT HEART CATHETERIZATION WITH CORONARY ANGIOGRAM;  Surgeon: Jacolyn Reedy, MD;  Location: [facility];  Service: Cardiovascular;  Laterality: N/A;  . LITHOTRIPSY     "many; probably 5 times"  . RADIAL ARTERY HARVEST  04/30/2011   Procedure: RADIAL ARTERY HARVEST;  Surgeon: Melrose Nakayama, MD;  Location: [place];  Service: Open Heart Surgery;  Laterality: Left;  . RETINAL DETACHMENT SURGERY  1990's   right eye; "made me have an early cataract"  . VASECTOMY      Social History:  Ambulatory  independently      reports that he has never smoked. He has never used smokeless tobacco. He reports current alcohol use. He reports current drug use. Drug: Marijuana.     Family History:   Family History  Problem Relation Age of Onset  . Cataracts Father   . Cataracts Mother   . Diabetes Paternal Aunt   . Diabetes Paternal Grandmother   . Amblyopia Neg Hx   . Blindness Neg Hx   . Glaucoma Neg Hx   . Macular degeneration Neg Hx   . Retinal detachment Neg Hx   . Strabismus Neg Hx   . Retinitis pigmentosa Neg Hx     Allergies: Allergies  Allergen  Reactions  . Gabapentin Other (See Comments)    "made me so dizzy I missed work for 2 days"  . Metformin And Related Shortness Of Breath  . Sulfa Antibiotics Shortness Of Breath and Rash  . Amoxicillin     rash   . Lisinopril     Cough   . Doxycycline Rash     Prior to Admission medications   Medication Sig Start Date End Date Taking? Authorizing Provider  ACCU-CHEK GUIDE test strip USE TO TEST BLOOD SUGAR 2 TO 3 TIMES A DAY 03/18/17   [provider]  allopurinol (ZYLOPRIM) 300 MG tablet Take 300 mg by mouth daily.    [provider]  aspirin EC 81 MG tablet Take 81 mg by  mouth every morning.     [provider]  atorvastatin (LIPITOR) 40 MG tablet Take 40 mg by mouth daily.    [provider]  B-D ULTRAFINE III SHORT PEN 31G X 8 MM MISC USE UTD WITH FLEXPEN. 03/18/17   [provider]  Biotin 5000 MCG TABS Take 5,000 mcg by mouth daily.     [provider]  bisoprolol (ZEBETA) 5 MG tablet TK 1 T PO QD 04/26/17   [provider]  Cholecalciferol (VITAMIN D3) 5000 UNITS TABS Take 5,000 Units by mouth daily.     [provider]  ciprofloxacin (CIPRO) 250 MG tablet Take 1 tablet (250 mg total) by mouth 2 (two) times daily. 07/17/13   Franchot Gallo, MD  fish oil-omega-3 fatty acids 1000 MG capsule Take 1 g by mouth 2 (two) times daily.    [provider]  glucosamine-chondroitin 500-400 MG tablet Take 2 tablets by mouth daily.    [provider]  HYDROcodone-acetaminophen (NORCO) 5-325 MG tablet Take 1 tablet by mouth every 6 (six) hours as needed. 03/31/15   Jaynee Eagles, PA-C  ibuprofen (ADVIL,MOTRIN) 200 MG tablet Take 400 mg by mouth every 8 (eight) hours as needed for moderate pain.     [provider]  Insulin Glargine (BASAGLAR KWIKPEN) 100 UNIT/ML SOPN Inject 100 Units into the skin daily.    [provider]  L-Lysine HCl 1000 MG TABS Take 1 tablet by mouth daily.     [provider]  magnesium oxide (MAG-OX) 400 MG tablet Take 400 mg by mouth 2 (two) times daily.     [provider]  naproxen (NAPROSYN) 500 MG tablet Take 500 mg by mouth daily.    [provider]  NOVOLOG FLEXPEN 100 UNIT/ML FlexPen INJECT 6 TO 12 UNITS INTO THE SKIN UTD BEFORE A MEAL 01/31/17   [provider]  omeprazole (PRILOSEC) 10 MG capsule Take 10 mg by mouth daily.    [provider]  simvastatin (ZOCOR) 40 MG tablet Take 40 mg by mouth every morning. Reported on 03/31/2015    [provider]  vitamin C (ASCORBIC ACID) 500 MG tablet Take 1,000 mg by mouth daily.     [provider]  VITAMIN E PO Take 1 capsule by mouth daily.    [provider]   Physical Exam: Blood pressure (!) 162/68, pulse (!) 35, temperature 99.1 F (37.3 C), temperature source Oral, resp. rate (!) 22, SpO2 96 %. 1. General:  in No  Acute distress   well  -appearing 2. Psychological: Alert and   Oriented 3. Head/ENT:   Dry Mucous Membranes                          Head Non traumatic, neck supple                            Poor Dentition 4. SKIN:  decreased Skin turgor,  Skin clean Dry and intact no rash 5. Heart: Regular rate and rhythm no  Murmur, no Rub or gallop 6. Lungs:  Clear to auscultation bilaterally, no wheezes or crackles   7. Abdomen: Soft,  non-tender, Non distended  Obese bowel sounds present 8. Lower extremities: no clubbing, cyanosis, no  edema 9. Neurologically Grossly intact, moving all 4 extremities equally  10. MSK: Normal range of motion   All other LABS:     Recent Labs  Lab 01/13/19 1345  WBC 4.4  NEUTROABS 3.1  HGB 15.6  HCT 46.0  MCV 92.0  PLT 147*     Recent Labs  Lab 01/13/19 1345  NA 135  K 5.0  CL 110  CO2 16*  GLUCOSE 157*  BUN 33*  CREATININE 1.54*  CALCIUM 8.4*     No results for input(s): AST, ALT, ALKPHOS, BILITOT, PROT, ALBUMIN in the last 168 hours.     Cultures: No  results found for: SDES, [place], CULT, REPTSTATUS   Radiological Exams on Admission: Dg Chest Portable 1 View  Result Date: 01/13/2019 CLINICAL DATA:  COVID-19 positive, chest congestion. EXAM: PORTABLE CHEST 1 VIEW COMPARISON:  October 08, 2015. FINDINGS: The heart size and mediastinal contours are within normal limits. Status post coronary bypass graft. Hypoinflation of the lungs is noted with minimal bibasilar subsegmental atelectasis. No pneumothorax or pleural effusion is noted. The visualized skeletal structures are unremarkable. IMPRESSION: Hypoinflation of the lungs is noted with minimal bibasilar subsegmental atelectasis. Electronically Signed   By: Marijo Conception M.D.   On: 01/13/2019 15:18    Chart has been reviewed   Assessment/Plan   68 y.o. male with medical history significant of CAD sp CABG 2013, HLD, HTN, GOUT, kidney stones  Admitted for Covid infection with elevated troponin   Present on Admission: . COVID-19 virus infection -  FROM HOME WITH KNOWN HX OF COVID19     - As per hx pt with evidence of  Cough:   chills, congestion  shortness of breath GI symptoms:  Severe fatigue    Following concerning LAB/ imaging findings:  CBC: leukopenia, lymphopenia       BMP: increased BUN/Cr        -Following work-up initiated:        sputum cultures   Ordered 01/13/19,   Following complications noted:  elevated troponin noted likely demand ischemia will continue to follow   evidence of AKI - will provide gentle rehydration   will be monitoring for evidence of other complications such as PE/DVT CVA or  cardiovascular events  Plan of treatment: - Transfer to [facility] facility if positive and beds are available     - Will follow daily d.dimer    - Supportive management -Fluid sparing resuscitation  -Provide oxygen as needed currently on RA  SpO2: 99 % - IF d.dimer elvated >5 will increase dose of lovenox    Poor Prognostic factors*  68 y.o.  Personal hx of  DM2, CAD, , HTN, obesity  Evidence of  organ damage  Present elevated trop, AKI,     Airborne precautions ordered     Will order Airborne and Contact precautions     AKI -setting of chronic CKD   stage II hold ibesartan and obtain urine electrolytes gently rehydrate and follow kidney function  DM2 -  - Order Sensitive  SSI   - continue home insulin regimen   -  check TSH and HgA1C  - Hold by mouth medications    . Hyperlipidemia -chronic continue home medications  . Hypertension -continue home medications  . CAD (coronary artery disease) -currently no chest pain more shortness of breath or dyspnea on exertion most likely related to ongoing viral infection continue to cycle cardiac enzymes continue aspirin statin and beta-blocker  . Elevated troponin -likely demand ischemia in the setting of Covid infection, cardiology aware recommend echogram okay to transfer to [facility]   Other plan as per orders.  DVT prophylaxis:  Lovenox     Code Status:  FULL CODE  as per patient   I had personally discussed CODE STATUS with patient    Family Communication:   Family not at  Bedside    Disposition Plan:      To home once workup is complete and patient is stable                      Consults called:  cardiology is aware , discussed with [facility] provider on call  Admission status:  ED Disposition    ED Disposition Condition Comment   Admit  The patient appears reasonably stabilized for admission considering the current resources, flow, and capabilities available in the ED at this time, and I doubt any other [facility] requiring further screening and/or treatment in the ED prior to admission is  present.       Obs  Level of care         SDU tele indefinitely please discontinue once patient no longer qualifies  Precautions:  Airborne and Contact precautions  PPE: Used by the provider:   P100  eye Goggles,  Gloves   gown     Zaki Gertsch 01/13/2019, 9:49 PM     [facility]     after 2 AM please page floor coverage PA If 7AM-7PM, please contact the day team taking care of the patient using [email or web address]

## 2019-01-13 NOTE — ED Notes (Signed)
Called [facility] for transport.  

## 2019-01-13 NOTE — ED Notes (Signed)
ED TO INPATIENT HANDOFF REPORT  ED Nurse Name and Phone #:  Rico Junker [phone]  S Name/Age/Gender Mitchell Lambert 68 y.o. male Room/Bed: WA12/WA12  Code Status   Code Status: Prior  Home/SNF/Other Home Patient oriented to: self, place, time and situation Is this baseline? Yes   Triage Complete: Triage complete  Chief Complaint covid+ fatigue  Triage Note Pt  arrived via EMS from home. Pt reports that he tested positive  on Monday and was instructed by PCP to come to ED if he is feeling worse,   C/o Increased fatigue,  loss of appetite, SOB with exertion, denies any fevers,     136/78, HR 68, RR 18, O2 97% RA   Allergies Allergies  Allergen Reactions  . Gabapentin Other (See Comments)    "made me so dizzy I missed work for 2 days"  . Metformin And Related Shortness Of Breath  . Sulfa Antibiotics Shortness Of Breath and Rash  . Amoxicillin     rash Did it involve swelling of the face/tongue/throat, SOB, or low BP? N Did it involve sudden or severe rash/hives, skin peeling, or any reaction on the inside of your mouth or nose?  Y Did you need to seek medical attention at a hospital or doctor's office? N When did it last happen?"years and years ago" If all above answers are "NO", may proceed with cephalosporin use.   Marland Kitchen Lisinopril     Cough   . Doxycycline Rash    Level of Care/Admitting Diagnosis ED Disposition    ED Disposition Condition [facility] Area: [place] [[id number]]  Level of Care: Progressive [102]  Covid Evaluation: Confirmed COVID Positive  Diagnosis: COVID-19 virus infection [id number]  Admitting Physician: Toy Baker [[id number]]  Attending Physician: Toy Baker [[id number]]  PT Class (Do Not Modify): Observation [104]  PT Acc Code (Do Not Modify): Observation [10022]       B Medical/Surgery History Past Medical History:  Diagnosis Date  . Angina    NO ANGINA SINCE CABG  . Arthritis     right ankle  . Chronic daily headache    "@ least every other day""improved since heart surgery"  . Coronary artery disease    PT STATES HEART DOING WELL SINCE CABG SURGERY - DR. TILLEY IS HIS CARDIOLOGIST  . Diabetes mellitus    Lantus x 10 yrs  . Gout   . History of kidney stones 09-08-12   multiple times with some lithotripsies  . Hyperlipemia   . Hypertension    Past Surgical History:  Procedure Laterality Date  . BACK SURGERY     microsurgery: spinal stenosis -lumbar  . bone spur  1990's   right great toe; "probably related to gout"  . CARDIAC CATHETERIZATION  04/27/11  . CATARACT EXTRACTION W/ INTRAOCULAR LENS IMPLANT  1990's   right; "lens was replaced twice over 3 months; lens is actually over iris"  . CORONARY ARTERY BYPASS GRAFT  04/30/2011   Procedure: CORONARY ARTERY BYPASS GRAFTING (CABG);  Surgeon: Melrose Nakayama, MD;  Location: [place];  Service: Open Heart Surgery;  Laterality: N/A;,x4 vessels  . CYSTOSCOPY W/ URETERAL STENT PLACEMENT Right 08/19/2012   Procedure: CYSTOSCOPY WITH RETROGRADE PYELOGRAM/URETERAL STENT PLACEMENT;  Surgeon: Molli Hazard, MD;  Location: [facility];  Service: Urology;  Laterality: Right;  . CYSTOSCOPY WITH RETROGRADE PYELOGRAM, URETEROSCOPY AND STENT PLACEMENT Left 07/17/2013   Procedure: CYSTOSCOPY, LEFT URETEROSCOPY, BASKET EXTRACTION OF STONE, INSERTION OF LEFT URETERAL STENT, ;  Surgeon: Jorja Loa, MD;  Location: [facility];  Service: Urology;  Laterality: Left;  . CYSTOSCOPY/RETROGRADE/URETEROSCOPY Right 09/12/2012   Procedure: CYSTOSCOPY/RETROGRADE/URETEROSCOPY;  Surgeon: Franchot Gallo, MD;  Location[facility];  Service: Urology;  Laterality: Right;  . HOLMIUM LASER APPLICATION Right [id number]   Procedure: HOLMIUM LASER APPLICATION;  Surgeon: Franchot Gallo, MD;  Location[facility];  Service: Urology;  Laterality: Right;  . HOLMIUM LASER APPLICATION Left [id number]   Procedure: HOLMIUM LASER APPLICATION;  Surgeon: Jorja Loa, MD;  Location[facility];  Service: Urology;  Laterality: Left;  . KIDNEY STONE SURGERY     "probably 3 times"  . LEFT HEART CATHETERIZATION WITH CORONARY ANGIOGRAM N/A 04/28/2011   Procedure: LEFT HEART CATHETERIZATION WITH CORONARY ANGIOGRAM;  Surgeon: Jacolyn Reedy, MD;  Location: [facility] C[facility];  Service: Cardiovascular;  Laterality: N/A;  . LITHOTRIPSY     "many; probably 5 times"  . RADIAL ARTERY HARVEST  04/30/2011   Procedure: RADIAL ARTERY HARVEST;  Surgeon: Melrose Nakayama, MD;  Location: [place];  Service: Open Heart Surgery;  Laterality: Left;  . RETINAL DETACHMENT SURGERY  1990's   right eye; "made me have an early cataract"  . VASECTOMY       A IV Location/Drains/Wounds Patient Lines/Drains/Airways Status   Active Line/Drains/Airways    Name:   Placement date:   Placement time:   Site:   Days:   Peripheral IV 01/13/19 Right Antecubital   01/13/19    1405    Antecubital   less than 1   Ureteral Drain/Stent Right ureter 4.8 Fr.   08/19/12    1434    Right ureter   2338   Ureteral Drain/Stent Left ureter 6 Fr.   07/17/13    0849    Left ureter   2006   Incision 04/30/11 Leg Right   04/30/11    1219     2815   Incision 04/30/11 Chest Other (Comment)   04/30/11    1219     2815   Incision (Closed) 07/17/13 N/A Other (Comment)   07/17/13    0815     2006          Intake/Output Last 24 hours No intake or output data in the 24 hours ending 01/13/19 2136  Labs/Imaging Results for orders placed or performed during the hospital encounter of 01/13/19 (from the past 48 hour(s))  CBC with Differential     Status: Abnormal   Collection Time: 01/13/19  1:45 PM  Result Value Ref Range   WBC 4.4 4.0 - 10.5 K/uL   RBC 5.00 4.22 - 5.81 MIL/uL   Hemoglobin 15.6 13.0 - 17.0 g/dL   HCT 46.0 39.0 - 52.0 %   MCV 92.0 80.0 - 100.0 fL   MCH 31.2 26.0 - 34.0 pg   MCHC 33.9 30.0 - 36.0 g/dL   RDW 13.0 11.5 - 15.5 %   Platelets 147 (L) 150 - 400 K/uL   nRBC 0.0 0.0 - 0.2 %    Neutrophils Relative % 73 %   Neutro Abs 3.1 1.7 - 7.7 K/uL   Lymphocytes Relative 15 %   Lymphs Abs 0.7 0.7 - 4.0 K/uL   Monocytes Relative 12 %   Monocytes Absolute 0.5 0.1 - 1.0 K/uL   Eosinophils Relative 0 %   Eosinophils Absolute 0.0 0.0 - 0.5 K/uL   Basophils Relative 0 %   Basophils Absolute 0.0 0.0 - 0.1 K/uL   Immature Granulocytes 0 %  Abs Immature Granulocytes 0.01 0.00 - 0.07 K/uL    Comment: Performed at [facility], [place] [address], [place], [place] [id number]  Basic metabolic panel     Status: Abnormal   Collection Time: 01/13/19  1:45 PM  Result Value Ref Range   Sodium 135 135 - 145 mmol/L   Potassium 5.0 3.5 - 5.1 mmol/L   Chloride 110 98 - 111 mmol/L   CO2 16 (L) 22 - 32 mmol/L   Glucose, Bld 157 (H) 70 - 99 mg/dL   BUN 33 (H) 8 - 23 mg/dL   Creatinine, Ser 1.54 (H) 0.61 - 1.24 mg/dL   Calcium 8.4 (L) 8.9 - 10.3 mg/dL   GFR calc non Af Amer 45 (L) >60 mL/min   GFR calc Af Amer 53 (L) >60 mL/min   Anion gap 9 5 - 15    Comment: Performed at [facility], [place] [address] [place], [place] [address]  Troponin I (High Sensitivity)     Status: Abnormal   Collection Time: 01/13/19  1:45 PM  Result Value Ref Range   Troponin I (High Sensitivity) 149 (HH) <18 ng/L    Comment: CRITICAL RESULT CALLED TO, READ BACK BY AND VERIFIED WITH: TEETERS,Z RN @1648  ON 01/13/2019 JACKSON,K (NOTE) Elevated high sensitivity troponin I (hsTnI) values and significant  changes across serial measurements may suggest ACS but many other  chronic and acute conditions are known to elevate hsTnI results.  Refer to the Links section for chest pain algorithms and additional  guidance. Performed at [facility], [place] [address] [place], [place] [address]   Troponin I (High Sensitivity)     Status: Abnormal   Collection Time: 01/13/19  4:59 PM  Result Value Ref Range   Troponin I (High Sensitivity) 147 (HH) <18 ng/L    Comment:  CRITICAL VALUE NOTED.  VALUE IS CONSISTENT WITH PREVIOUSLY REPORTED AND CALLED VALUE. (NOTE) Elevated high sensitivity troponin I (hsTnI) values and significant  changes across serial measurements may suggest ACS but many other  chronic and acute conditions are known to elevate hsTnI results.  Refer to the Links section for chest pain algorithms and additional  guidance. Performed at [facility], [place] [address] [place], [place] [address]    Dg Chest Portable 1 View  Result Date: 01/13/2019 CLINICAL DATA:  COVID-19 positive, chest congestion. EXAM: PORTABLE CHEST 1 VIEW COMPARISON:  October 08, 2015. FINDINGS: The heart size and mediastinal contours are within normal limits. Status post coronary bypass graft. Hypoinflation of the lungs is noted with minimal bibasilar subsegmental atelectasis. No pneumothorax or pleural effusion is noted. The visualized skeletal structures are unremarkable. IMPRESSION: Hypoinflation of the lungs is noted with minimal bibasilar subsegmental atelectasis. Electronically Signed   By: Marijo Conception M.D.   On: 01/13/2019 15:18    Pending Labs Unresulted Labs (From admission, onward)    Start     Ordered   01/13/19 2018  Sodium, urine, random  Once,   STAT     01/13/19 2017   01/13/19 2018  Creatinine, urine, random  Once,   STAT     01/13/19 2017   01/13/19 1840  Hepatic function panel  ONCE - STAT,   STAT     01/13/19 1839   01/13/19 1839  Lactic acid, plasma  Now then every 2 hours,   STAT     01/13/19 1839   01/13/19 1839  Blood Culture (routine x 2)  BLOOD CULTURE  X 2,   STAT     01/13/19 1839   01/13/19 1839  D-dimer, quantitative  ONCE - STAT,   STAT    Comments: Used for prognosis and bed placement. Do not order CT or V/Q.    01/13/19 1839   01/13/19 1839  Procalcitonin  ONCE - STAT,   STAT     01/13/19 1839   01/13/19 1839  Lactate dehydrogenase  Once,   STAT     01/13/19 1839   01/13/19 1839  Ferritin  Once,   STAT      01/13/19 1839   01/13/19 1839  Triglycerides  Once,   STAT     01/13/19 1839   01/13/19 1839  Fibrinogen  Once,   STAT     01/13/19 1839   01/13/19 1839  C-reactive protein  Once,   STAT     01/13/19 1839   Signed and Held  ABO/Rh  Once,   R     Signed and Held   Signed and Held  CBC with Differential/Platelet  Daily,   R     Signed and Held   Signed and Held  Magnesium  Daily,   R     Signed and Held   Signed and Held  Culture, sputum-assessment  Once,   R     Signed and Held   Signed and Held  HIV Antibody (routine testing w rflx)  (HIV Antibody (Routine testing w reflex) panel)  Tomorrow morning,   R     Signed and Held   Signed and Held  CBC with Differential/Platelet  Daily,   R     Signed and Held   Signed and Held  Comprehensive metabolic panel  Daily,   R     Signed and Held   Signed and Held  C-reactive protein  Daily,   R     Signed and Held   Signed and Held  D-dimer, quantitative (not at [facility])  Daily,   R     Signed and Held   Signed and Held  Ferritin  Daily,   R     Signed and Held   Signed and Held  Magnesium  Daily,   R     Signed and Held   Signed and Held  Phosphorus  Daily,   R     Signed and Held   Signed and Held  Hemoglobin A1c  Once,   R    Comments: To assess prior glycemic control    Signed and Held          Vitals/Pain Today's Vitals   01/13/19 2000 01/13/19 2030 01/13/19 2100 01/13/19 2108  BP: (!) 141/94 (!) 151/76 (!) 160/73   Pulse: 62 (!) 51 (!) 52   Resp: 20 14 20    Temp:      TempSrc:      SpO2: 96% 97% 99%   PainSc:    5     Isolation Precautions Airborne and Contact precautions  Medications Medications  sodium chloride 0.9 % bolus 1,000 mL (1,000 mLs Intravenous Bolus 01/13/19 1440)  LORazepam (ATIVAN) tablet 1 mg (1 mg Oral Given 01/13/19 1706)    Mobility walks     R Recommendations: See Admitting Provider Note  Report given to:   [place]

## 2019-01-14 ENCOUNTER — Observation Stay (HOSPITAL_COMMUNITY): Payer: Medicare Other

## 2019-01-14 DIAGNOSIS — I5032 Chronic diastolic (congestive) heart failure: Secondary | ICD-10-CM | POA: Diagnosis present

## 2019-01-14 DIAGNOSIS — K219 Gastro-esophageal reflux disease without esophagitis: Secondary | ICD-10-CM | POA: Diagnosis present

## 2019-01-14 DIAGNOSIS — Z794 Long term (current) use of insulin: Secondary | ICD-10-CM | POA: Diagnosis not present

## 2019-01-14 DIAGNOSIS — F419 Anxiety disorder, unspecified: Secondary | ICD-10-CM | POA: Diagnosis present

## 2019-01-14 DIAGNOSIS — E669 Obesity, unspecified: Secondary | ICD-10-CM | POA: Diagnosis present

## 2019-01-14 DIAGNOSIS — R778 Other specified abnormalities of plasma proteins: Secondary | ICD-10-CM | POA: Diagnosis not present

## 2019-01-14 DIAGNOSIS — Z87442 Personal history of urinary calculi: Secondary | ICD-10-CM | POA: Diagnosis not present

## 2019-01-14 DIAGNOSIS — I340 Nonrheumatic mitral (valve) insufficiency: Secondary | ICD-10-CM

## 2019-01-14 DIAGNOSIS — Z833 Family history of diabetes mellitus: Secondary | ICD-10-CM | POA: Diagnosis not present

## 2019-01-14 DIAGNOSIS — I10 Essential (primary) hypertension: Secondary | ICD-10-CM | POA: Diagnosis not present

## 2019-01-14 DIAGNOSIS — M109 Gout, unspecified: Secondary | ICD-10-CM | POA: Diagnosis present

## 2019-01-14 DIAGNOSIS — M19071 Primary osteoarthritis, right ankle and foot: Secondary | ICD-10-CM | POA: Diagnosis present

## 2019-01-14 DIAGNOSIS — Z66 Do not resuscitate: Secondary | ICD-10-CM | POA: Diagnosis present

## 2019-01-14 DIAGNOSIS — N182 Chronic kidney disease, stage 2 (mild): Secondary | ICD-10-CM | POA: Diagnosis present

## 2019-01-14 DIAGNOSIS — E785 Hyperlipidemia, unspecified: Secondary | ICD-10-CM | POA: Diagnosis present

## 2019-01-14 DIAGNOSIS — Z9849 Cataract extraction status, unspecified eye: Secondary | ICD-10-CM | POA: Diagnosis not present

## 2019-01-14 DIAGNOSIS — I2510 Atherosclerotic heart disease of native coronary artery without angina pectoris: Secondary | ICD-10-CM | POA: Diagnosis present

## 2019-01-14 DIAGNOSIS — U071 COVID-19: Secondary | ICD-10-CM | POA: Diagnosis present

## 2019-01-14 DIAGNOSIS — E119 Type 2 diabetes mellitus without complications: Secondary | ICD-10-CM | POA: Diagnosis not present

## 2019-01-14 DIAGNOSIS — I248 Other forms of acute ischemic heart disease: Secondary | ICD-10-CM | POA: Diagnosis present

## 2019-01-14 DIAGNOSIS — Z961 Presence of intraocular lens: Secondary | ICD-10-CM | POA: Diagnosis present

## 2019-01-14 DIAGNOSIS — J1289 Other viral pneumonia: Secondary | ICD-10-CM | POA: Diagnosis present

## 2019-01-14 DIAGNOSIS — E860 Dehydration: Secondary | ICD-10-CM | POA: Diagnosis not present

## 2019-01-14 DIAGNOSIS — E1122 Type 2 diabetes mellitus with diabetic chronic kidney disease: Secondary | ICD-10-CM | POA: Diagnosis present

## 2019-01-14 DIAGNOSIS — R519 Headache, unspecified: Secondary | ICD-10-CM | POA: Diagnosis present

## 2019-01-14 DIAGNOSIS — N179 Acute kidney failure, unspecified: Secondary | ICD-10-CM | POA: Diagnosis present

## 2019-01-14 DIAGNOSIS — I130 Hypertensive heart and chronic kidney disease with heart failure and stage 1 through stage 4 chronic kidney disease, or unspecified chronic kidney disease: Secondary | ICD-10-CM | POA: Diagnosis present

## 2019-01-14 DIAGNOSIS — Z951 Presence of aortocoronary bypass graft: Secondary | ICD-10-CM | POA: Diagnosis not present

## 2019-01-14 DIAGNOSIS — E782 Mixed hyperlipidemia: Secondary | ICD-10-CM | POA: Diagnosis not present

## 2019-01-14 DIAGNOSIS — J9811 Atelectasis: Secondary | ICD-10-CM | POA: Diagnosis present

## 2019-01-14 DIAGNOSIS — Z6832 Body mass index (BMI) 32.0-32.9, adult: Secondary | ICD-10-CM | POA: Diagnosis not present

## 2019-01-14 LAB — CREATININE, URINE, RANDOM: Creatinine, Urine: 103.52 mg/dL

## 2019-01-14 LAB — CBC WITH DIFFERENTIAL/PLATELET
Abs Immature Granulocytes: 0.01 10*3/uL (ref 0.00–0.07)
Basophils Absolute: 0 10*3/uL (ref 0.0–0.1)
Basophils Relative: 0 %
Eosinophils Absolute: 0 10*3/uL (ref 0.0–0.5)
Eosinophils Relative: 0 %
HCT: 44.2 % (ref 39.0–52.0)
Hemoglobin: 15 g/dL (ref 13.0–17.0)
Immature Granulocytes: 0 %
Lymphocytes Relative: 23 %
Lymphs Abs: 0.9 10*3/uL (ref 0.7–4.0)
MCH: 31.1 pg (ref 26.0–34.0)
MCHC: 33.9 g/dL (ref 30.0–36.0)
MCV: 91.7 fL (ref 80.0–100.0)
Monocytes Absolute: 0.5 10*3/uL (ref 0.1–1.0)
Monocytes Relative: 11 %
Neutro Abs: 2.6 10*3/uL (ref 1.7–7.7)
Neutrophils Relative %: 66 %
Platelets: 153 10*3/uL (ref 150–400)
RBC: 4.82 MIL/uL (ref 4.22–5.81)
RDW: 12.7 % (ref 11.5–15.5)
WBC: 4 10*3/uL (ref 4.0–10.5)
nRBC: 0 % (ref 0.0–0.2)

## 2019-01-14 LAB — COMPREHENSIVE METABOLIC PANEL
ALT: 19 U/L (ref 0–44)
AST: 24 U/L (ref 15–41)
Albumin: 2.7 g/dL — ABNORMAL LOW (ref 3.5–5.0)
Alkaline Phosphatase: 175 U/L — ABNORMAL HIGH (ref 38–126)
Anion gap: 7 (ref 5–15)
BUN: 31 mg/dL — ABNORMAL HIGH (ref 8–23)
CO2: 22 mmol/L (ref 22–32)
Calcium: 8.2 mg/dL — ABNORMAL LOW (ref 8.9–10.3)
Chloride: 110 mmol/L (ref 98–111)
Creatinine, Ser: 1.62 mg/dL — ABNORMAL HIGH (ref 0.61–1.24)
GFR calc Af Amer: 49 mL/min — ABNORMAL LOW (ref >60)
GFR calc non Af Amer: 43 mL/min — ABNORMAL LOW (ref >60)
Glucose, Bld: 117 mg/dL — ABNORMAL HIGH (ref 70–99)
Potassium: 4.6 mmol/L (ref 3.5–5.1)
Sodium: 139 mmol/L (ref 135–145)
Total Bilirubin: 0.7 mg/dL (ref 0.3–1.2)
Total Protein: 5.9 g/dL — ABNORMAL LOW (ref 6.5–8.1)

## 2019-01-14 LAB — ECHOCARDIOGRAM COMPLETE
Height: 71 in
Weight: 3781.33 oz

## 2019-01-14 LAB — TROPONIN I (HIGH SENSITIVITY)
Troponin I (High Sensitivity): 160 ng/L (ref <18)
Troponin I (High Sensitivity): 141 ng/L (ref <18)

## 2019-01-14 LAB — HEMOGLOBIN A1C
Hgb A1c MFr Bld: 8 % — ABNORMAL HIGH (ref 4.8–5.6)
Mean Plasma Glucose: 182.9 mg/dL

## 2019-01-14 LAB — TSH: TSH: 1.543 u[IU]/mL (ref 0.350–4.500)

## 2019-01-14 LAB — ABO/RH: ABO/RH(D): B POS

## 2019-01-14 LAB — PHOSPHORUS: Phosphorus: 2.8 mg/dL (ref 2.5–4.6)

## 2019-01-14 LAB — MRSA PCR SCREENING: MRSA by PCR: NEGATIVE

## 2019-01-14 LAB — C-REACTIVE PROTEIN: CRP: 3 mg/dL — ABNORMAL HIGH (ref <1.0)

## 2019-01-14 LAB — HIV ANTIBODY (ROUTINE TESTING W REFLEX): HIV Screen 4th Generation wRfx: NONREACTIVE

## 2019-01-14 LAB — GLUCOSE, CAPILLARY
Glucose-Capillary: 201 mg/dL — ABNORMAL HIGH (ref 70–99)
Glucose-Capillary: 290 mg/dL — ABNORMAL HIGH (ref 70–99)

## 2019-01-14 LAB — FERRITIN: Ferritin: 225 ng/mL (ref 24–336)

## 2019-01-14 LAB — SODIUM, URINE, RANDOM: Sodium, Ur: 75 mmol/L

## 2019-01-14 LAB — LACTIC ACID, PLASMA
Lactic Acid, Venous: 1.2 mmol/L (ref 0.5–1.9)
Lactic Acid, Venous: 0.8 mmol/L (ref 0.5–1.9)

## 2019-01-14 LAB — MAGNESIUM: Magnesium: 1.7 mg/dL (ref 1.7–2.4)

## 2019-01-14 LAB — D-DIMER, QUANTITATIVE: D-Dimer, Quant: 0.95 ug/mL-FEU — ABNORMAL HIGH (ref 0.00–0.50)

## 2019-01-14 MED ORDER — DEXAMETHASONE 6 MG PO TABS
6.0000 mg | ORAL_TABLET | Freq: Every day | ORAL | Status: DC
  Administered 2019-01-14 – 2019-01-15 (×2): 6 mg via ORAL
  Filled 2019-01-14: qty 1

## 2019-01-14 NOTE — Progress Notes (Signed)
  Echocardiogram 2D Echocardiogram has been performed.  Mitchell Lambert 01/14/2019, 11:03 AM

## 2019-01-14 NOTE — Progress Notes (Signed)
PROGRESS NOTE    Mitchell Lambert  [id number] DOB: November 04, 1950 DOA: 01/13/2019 PCP: Lawerance Cruel, MD   Brief Narrative:  68 year old with history of CAD status post CABG 2013, HLD, HTN, gout, renal stone initially diagnosed with COVID-19 about a week ago prior to admission reported of loss of taste and poor oral intake.  Slowly also developed some URI type symptoms.  In the ER had questionable atypical chest pain.  Due to elevated troponin, was reviewed by cardiologist who did not think at that time had ACS therefore transferred to [facility].   Assessment & Plan:   Active Problems:   Insulin dependent type 2 diabetes mellitus, controlled (HCC)   Hyperlipidemia   Hypertension   CAD (coronary artery disease)   Elevated troponin   COVID-19 virus infection  Elevated troponin -Demand ischemia, not consistent with active ACS.  Continue to monitor him symptomatically.  Case reviewed by cardiology -Echocardiogram-ejection fraction 55 to [id number], grade 1 diastolic dysfunction.  COVID-19 pneumonia -Oxygen levels-Currently on RA -On p.o. Decadron.  Hold off on remdesivir. -Routine: Labs have been reviewed including ferritin, LDH, CRP, d-dimer, fibrinogen.  Will need to trend this lab daily. -procalcitonin-Neg -Chest x-ray-minimal bilateral subsegmental atelectasis -Supportive care-antitussive, inhalers, I-S/flutter -CODE STATUS confirmed  CAD status post CABG Chronic congestive heart failure with preserved ejection fraction, 55% -Cardiac enzymes remained flat.  EKG shows nonspecific ST changes reviewed by cardiology -Left heart cath 2013 showed three-vessel disease with preserved LV function followed by CABG. -Aspirin, statin, bisoprolol -Irbesartan on hold due to AKI  AKI on CKD stage II -Baseline creatinine 1.2.  Admission creatinine 1.54, trended up to 1.6 -Avapro on hold.  Gentle hydration.  Diabetes mellitus type 2 -Insulin sliding scale and Accu-Cheks -Lantus  100 units daily.    Essential hypertension -On bisoprolol  Hyperlipidemia -Atorvastatin  Obesity with BMI greater than 30 -Advised outpatient follow-up with PCP  GERD -PPI  DVT prophylaxis: Lovenox Code Status: Full code Family Communication:   Disposition Plan: Maintain hospital stay as we will need to monitor his inflammatory markers to ensure that do not worsen especially in the setting of COVID-19 and known to cause cardiac conditions.  Consultants:   Cardiology   Subjective: No complaints, denies any chest pain.  Review of Systems Otherwise negative except as per HPI, including: General: Denies fever, chills, night sweats or unintended weight loss. Resp: Denies cough, wheezing, shortness of breath. Cardiac: Denies chest pain, palpitations, orthopnea, paroxysmal nocturnal dyspnea. GI: Denies abdominal pain, nausea, vomiting, diarrhea or constipation GU: Denies dysuria, frequency, hesitancy or incontinence MS: Denies muscle aches, joint pain or swelling Neuro: Denies headache, neurologic deficits (focal weakness, numbness, tingling), abnormal gait Psych: Denies anxiety, depression, SI/HI/AVH Skin: Denies new rashes or lesions ID: Denies sick contacts, exotic exposures, travel  Objective: Vitals:   01/14/19 0030 01/14/19 0057 01/14/19 0315 01/14/19 0739  BP: (!) 159/73 (!) 159/73 (!) 148/65 (!) 142/80  Pulse: 60 60 60 61  Resp: 18 18 (!) 24 18  Temp: 100.1 F (37.8 C) 100 F (37.8 C) 99.1 F (37.3 C) 99.6 F (37.6 C)  TempSrc: Oral Oral Oral Oral  SpO2: 99%  95% 97%  Weight:  107.2 kg    Height:  5\' 11"  (1.803 m)      Intake/Output Summary (Last 24 hours) at 01/14/2019 0744 Last data filed at 01/14/2019 0300 Gross per 24 hour  Intake 184.36 ml  Output 500 ml  Net -315.64 ml   Filed Weights   01/14/19 0057  Weight: 107.2 kg    Examination:  General exam: Appears calm and comfortable  Respiratory system: Clear to auscultation. Respiratory effort  normal. Cardiovascular system: S1 & S2 heard, RRR. No JVD, murmurs, rubs, gallops or clicks. No pedal edema. Gastrointestinal system: Abdomen is nondistended, soft and nontender. No organomegaly or masses felt. Normal bowel sounds heard. Central nervous system: Alert and oriented. No focal neurological deficits. Extremities: Symmetric 5 x 5 power. Skin: No rashes, lesions or ulcers Psychiatry: Judgement and insight appear normal. Mood & affect appropriate.   Data Reviewed:   CBC: Recent Labs  Lab 01/13/19 1345 01/14/19 0010  WBC 4.4 4.0  NEUTROABS 3.1 2.6  HGB 15.6 15.0  HCT 46.0 44.2  MCV 92.0 91.7  PLT 147* 0000000   Basic Metabolic Panel: Recent Labs  Lab 01/13/19 1345 01/14/19 0010  NA 135 139  K 5.0 4.6  CL 110 110  CO2 16* 22  GLUCOSE 157* 117*  BUN 33* 31*  CREATININE 1.54* 1.62*  CALCIUM 8.4* 8.2*  MG  --  1.7  PHOS  --  2.8   GFR: Estimated Creatinine Clearance: 53.6 mL/min (A) (by C-G formula based on SCr of 1.62 mg/dL (H)). Liver Function Tests: Recent Labs  Lab 01/13/19 1957 01/14/19 0010  AST 53* 24  ALT 27 19  ALKPHOS 125 175*  BILITOT 2.3* 0.7  PROT 5.1* 5.9*  ALBUMIN 2.4* 2.7*   No results for input(s): LIPASE, AMYLASE in the last 168 hours. No results for input(s): AMMONIA in the last 168 hours. Coagulation Profile: No results for input(s): INR, PROTIME in the last 168 hours. Cardiac Enzymes: No results for input(s): CKTOTAL, CKMB, CKMBINDEX, TROPONINI in the last 168 hours. BNP (last 3 results) No results for input(s): PROBNP in the last 8760 hours. HbA1C: No results for input(s): HGBA1C in the last 72 hours. CBG: Recent Labs  Lab 01/13/19 2253  GLUCAP 97   Lipid Profile: Recent Labs    01/13/19 1957  TRIG 39   Thyroid Function Tests: Recent Labs    01/14/19 0010  TSH 1.543   Anemia Panel: Recent Labs    01/14/19 0010  FERRITIN 225   Sepsis Labs: Recent Labs  Lab 01/13/19 1957 01/14/19 0010 01/14/19 0232   PROCALCITON <0.10  --   --   LATICACIDVEN  --  1.2 0.8    Recent Results (from the past 240 hour(s))  Blood Culture (routine x 2)     Status: None (Preliminary result)   Collection Time: 01/13/19 12:10 AM   Specimen: BLOOD  Result Value Ref Range Status   Specimen Description BLOOD RIGHT ANTECUBITAL  Final   Special Requests   Final    BOTTLES DRAWN AEROBIC AND ANAEROBIC Blood Culture adequate volume Performed at [facility], [place] [address] [place], [place] [address]    Culture PENDING  Incomplete   Report Status PENDING  Incomplete  MRSA PCR Screening     Status: None   Collection Time: 01/13/19 11:16 PM   Specimen: Nasal Mucosa; Nasopharyngeal  Result Value Ref Range Status   MRSA by PCR NEGATIVE NEGATIVE Final    Comment:        The GeneXpert MRSA Assay (FDA approved for NASAL specimens only), is one component of a comprehensive MRSA colonization surveillance program. It is not intended to diagnose MRSA infection nor to guide or monitor treatment for MRSA infections. Performed at [facility], [place] [address] [place], [place] [address]  Radiology Studies: Dg Chest Portable 1 View  Result Date: 01/13/2019 CLINICAL DATA:  COVID-19 positive, chest congestion. EXAM: PORTABLE CHEST 1 VIEW COMPARISON:  October 08, 2015. FINDINGS: The heart size and mediastinal contours are within normal limits. Status post coronary bypass graft. Hypoinflation of the lungs is noted with minimal bibasilar subsegmental atelectasis. No pneumothorax or pleural effusion is noted. The visualized skeletal structures are unremarkable. IMPRESSION: Hypoinflation of the lungs is noted with minimal bibasilar subsegmental atelectasis. Electronically Signed   By: Marijo Conception M.D.   On: 01/13/2019 15:18        Scheduled Meds: . allopurinol  300 mg Oral Daily  . aspirin EC  81 mg Oral q morning - 10a  . atorvastatin  40 mg Oral q1800  . bisoprolol  10 mg Oral  Daily  . enoxaparin (LOVENOX) injection  55 mg Subcutaneous QHS  . feeding supplement (ENSURE ENLIVE)  237 mL Oral BID BM  . influenza vaccine adjuvanted  0.5 mL Intramuscular Tomorrow-1000  . insulin aspart  0-5 Units Subcutaneous QHS  . insulin aspart  0-9 Units Subcutaneous TID WC  . insulin glargine  100 Units Subcutaneous Daily  . pantoprazole  40 mg Oral Daily  . sodium chloride flush  3 mL Intravenous Q12H  . tamsulosin  0.4 mg Oral QPC supper   Continuous Infusions: . sodium chloride 75 mL/hr at 01/14/19 0000     LOS: 0 days   Time spent=25 mins    Anique Beckley Arsenio Loader, MD [facility]  If 7PM-7AM, please contact night-coverage  01/14/2019, 7:44 AM

## 2019-01-14 NOTE — Progress Notes (Signed)
0500 EKG performed.  ST elevation noted in leads III and aVF with old Q waves in V3, however unchanged if not slightly improved from previous EKGs in ED yesterday evening.  No chest pain/tightness/pressure.  Dr. Alcario Drought paged and on unit to look at EKG. Troponin down to 140 from 160.  Will continue to monitor.

## 2019-01-14 NOTE — Progress Notes (Addendum)
Attempted to call patient's wife Mitchell Lambert for daily update. Message left with call back number. Patient is alert and oriented and has personal cellphone at bedside. Patient is currently resting comfortably in a side lying position. Patient has IS and flutter valve at bedside. Use encouraged.  14:05 Spoke to patient's wife Mitchell Lambert, update given. All questions answered.

## 2019-01-15 LAB — CBC WITH DIFFERENTIAL/PLATELET
Abs Immature Granulocytes: 0.03 10*3/uL (ref 0.00–0.07)
Basophils Absolute: 0 10*3/uL (ref 0.0–0.1)
Basophils Relative: 0 %
Eosinophils Absolute: 0 10*3/uL (ref 0.0–0.5)
Eosinophils Relative: 0 %
HCT: 43.5 % (ref 39.0–52.0)
Hemoglobin: 14.6 g/dL (ref 13.0–17.0)
Immature Granulocytes: 1 %
Lymphocytes Relative: 12 %
Lymphs Abs: 0.6 10*3/uL — ABNORMAL LOW (ref 0.7–4.0)
MCH: 30.7 pg (ref 26.0–34.0)
MCHC: 33.6 g/dL (ref 30.0–36.0)
MCV: 91.4 fL (ref 80.0–100.0)
Monocytes Absolute: 0.4 10*3/uL (ref 0.1–1.0)
Monocytes Relative: 8 %
Neutro Abs: 3.8 10*3/uL (ref 1.7–7.7)
Neutrophils Relative %: 79 %
Platelets: 155 10*3/uL (ref 150–400)
RBC: 4.76 MIL/uL (ref 4.22–5.81)
RDW: 12.6 % (ref 11.5–15.5)
WBC: 4.8 10*3/uL (ref 4.0–10.5)
nRBC: 0 % (ref 0.0–0.2)

## 2019-01-15 LAB — COMPREHENSIVE METABOLIC PANEL
ALT: 19 U/L (ref 0–44)
AST: 20 U/L (ref 15–41)
Albumin: 2.5 g/dL — ABNORMAL LOW (ref 3.5–5.0)
Alkaline Phosphatase: 126 U/L (ref 38–126)
Anion gap: 9 (ref 5–15)
BUN: 38 mg/dL — ABNORMAL HIGH (ref 8–23)
CO2: 18 mmol/L — ABNORMAL LOW (ref 22–32)
Calcium: 8.3 mg/dL — ABNORMAL LOW (ref 8.9–10.3)
Chloride: 111 mmol/L (ref 98–111)
Creatinine, Ser: 1.55 mg/dL — ABNORMAL HIGH (ref 0.61–1.24)
GFR calc Af Amer: 52 mL/min — ABNORMAL LOW (ref >60)
GFR calc non Af Amer: 45 mL/min — ABNORMAL LOW (ref >60)
Glucose, Bld: 239 mg/dL — ABNORMAL HIGH (ref 70–99)
Potassium: 4.9 mmol/L (ref 3.5–5.1)
Sodium: 138 mmol/L (ref 135–145)
Total Bilirubin: 0.4 mg/dL (ref 0.3–1.2)
Total Protein: 5.6 g/dL — ABNORMAL LOW (ref 6.5–8.1)

## 2019-01-15 LAB — GLUCOSE, CAPILLARY
Glucose-Capillary: 195 mg/dL — ABNORMAL HIGH (ref 70–99)
Glucose-Capillary: 216 mg/dL — ABNORMAL HIGH (ref 70–99)

## 2019-01-15 LAB — D-DIMER, QUANTITATIVE: D-Dimer, Quant: 0.54 ug/mL-FEU — ABNORMAL HIGH (ref 0.00–0.50)

## 2019-01-15 LAB — MAGNESIUM: Magnesium: 1.8 mg/dL (ref 1.7–2.4)

## 2019-01-15 LAB — C-REACTIVE PROTEIN: CRP: 4.1 mg/dL — ABNORMAL HIGH (ref <1.0)

## 2019-01-15 LAB — PHOSPHORUS: Phosphorus: 3.2 mg/dL (ref 2.5–4.6)

## 2019-01-15 LAB — FERRITIN: Ferritin: 279 ng/mL (ref 24–336)

## 2019-01-15 MED ORDER — BENZONATATE 100 MG PO CAPS: 100.0000 mg | ORAL_CAPSULE | Freq: Four times a day (QID) | ORAL | 0 refills | Status: DC | PRN

## 2019-01-15 MED ORDER — GUAIFENESIN-DM 100-10 MG/5ML PO SYRP: 10.0000 mL | ORAL_SOLUTION | ORAL | 0 refills | Status: DC | PRN

## 2019-01-15 MED ORDER — IPRATROPIUM-ALBUTEROL 20-100 MCG/ACT IN AERS: 1.0000 | INHALATION_SPRAY | Freq: Four times a day (QID) | RESPIRATORY_TRACT | 1 refills | Status: DC | PRN

## 2019-01-15 MED ORDER — PREDNISONE 10 MG PO TABS: 40.0000 mg | ORAL_TABLET | Freq: Every day | ORAL | 0 refills | Status: AC

## 2019-01-15 NOTE — Discharge Summary (Signed)
Physician Discharge Summary  Mitchell Lambert [id number] DOB: [birth year] DOA: 01/13/2019  PCP: Lawerance Cruel, MD  Admit date: 01/13/2019 Discharge date: 01/15/2019  Admitted From: Home Disposition:  Home   Recommendations for Outpatient Follow-up:  1. Follow up with PCP in 1-2 weeks 2. Please obtain BMP/CBC in one week your next doctors visit.  3. Follow up outpatient With Cardiology 4. Tessalon Perles, antitussive prescribed 5. Combivent prescribed 6. 5 days p.o. prednisone prescribed  Discharge Condition: Stable CODE STATUS: DNR Diet recommendation: Heart Healthy.   Brief/Interim Summary: 68 year old with history of CAD status post CABG 2013, HLD, HTN, gout, renal stone initially diagnosed with COVID-19 about a week ago prior to admission reported of loss of taste and poor oral intake.  Slowly also developed some URI type symptoms.  In the ER had questionable atypical chest pain.  Due to elevated troponin, was reviewed by cardiologist who did not think at that time had ACS therefore transferred to [facility].  Echocardiogram showed ejection fraction 55 to 60% with grade 1 diastolic dysfunction.  He remained chest pain-free.  Only reported of cough but denied any shortness of breath or other complaints.  Inflammatory markers are stable.  Spoke with his wife on the day of discharge as well. Stable for discharge today.    Discharge Diagnoses:  Active Problems:   Insulin dependent type 2 diabetes mellitus, controlled (HCC)   Hyperlipidemia   Hypertension   CAD (coronary artery disease)   Elevated troponin   COVID-19 virus infection  Elevated troponin, resolved -Demand ischemia, not consistent with active ACS.  Continue to monitor him symptomatically.  Case reviewed by cardiology -Echocardiogram-ejection fraction 55 to [id number], grade 1 diastolic dysfunction.  COVID-19 pneumonia, stable -Oxygen levels-Currently on RA -Complete prednisone at home. -Routine: Labs  have been reviewed including ferritin, LDH, CRP, d-dimer, fibrinogen.  Will need to trend this lab daily. -procalcitonin-Neg -Chest x-ray-minimal bilateral subsegmental atelectasis -Supportive care-antitussive, inhalers, I-S/flutter -CODE STATUS confirmed  CAD status post CABG Chronic congestive heart failure with preserved ejection fraction, 55% -Cardiac enzymes remained flat.  EKG shows nonspecific ST changes reviewed by cardiology -Left heart cath 2013 showed three-vessel disease with preserved LV function followed by CABG. -Aspirin, statin, bisoprolol  AKI on CKD stage II -Baseline creatinine 1.2.  Admission creatinine 1.54, trended up to 1.6 -Resume home medications.  1.5 might be around his baseline now.  Diabetes mellitus type 2 -Insulin sliding scale and Accu-Cheks -Lantus 100 units daily.    Essential hypertension -On bisoprolol  Hyperlipidemia -Atorvastatin  Obesity with BMI greater than 30 -Advised outpatient follow-up with PCP  GERD -PPI  Consultations:  Cardiology  Subjective: Feels okay, no complaints.  Wishes to go home.  Discharge Exam: Vitals:   01/15/19 0320 01/15/19 0730  BP: (!) 142/88 (!) 162/85  Pulse: (!) 43 60  Resp: 19 20  Temp: 98.4 F (36.9 C) 99.2 F (37.3 C)  SpO2: 94% 97%   Vitals:   01/14/19 1646 01/14/19 2043 01/15/19 0320 01/15/19 0730  BP: 137/79 (!) 143/77 (!) 142/88 (!) 162/85  Pulse: (!) 54 (!) 52 (!) 43 60  Resp: 20 16 19 20   Temp: 98 F (36.7 C) 98.7 F (37.1 C) 98.4 F (36.9 C) 99.2 F (37.3 C)  TempSrc: Oral Oral Oral Oral  SpO2: 97% 95% 94% 97%  Weight:      Height:        General: Pt is alert, awake, not in acute distress Cardiovascular: RRR, S1/S2 +, no rubs, no  gallops Respiratory: CTA bilaterally, no wheezing, no rhonchi Abdominal: Soft, NT, ND, bowel sounds + Extremities: no edema, no cyanosis  Discharge Instructions  Discharge Instructions    Discharge patient   Complete by: As directed     Discharge disposition: 01-Home or Self Care   Discharge patient date: 01/15/2019   MyChart COVID-19 home monitoring program   Complete by: Jan 15, 2019    Is the patient willing to use the [facility] for home monitoring?: Yes   Temperature monitoring   Complete by: Jan 15, 2019    After how many days would you like to receive a notification of this patient's flowsheet entries?: 1     Allergies as of 01/15/2019      Reactions   Gabapentin Other (See Comments)   "made me so dizzy I missed work for 2 days"   Metformin And Related Shortness Of Breath   Sulfa Antibiotics Shortness Of Breath, Rash   Amoxicillin    rash Did it involve swelling of the face/tongue/throat, SOB, or low BP? N Did it involve sudden or severe rash/hives, skin peeling, or any reaction on the inside of your mouth or nose?  Y Did you need to seek medical attention at a hospital or doctor's office? N When did it last happen?"years and years ago" If all above answers are "NO", may proceed with cephalosporin use.   Lisinopril    Cough   Doxycycline Rash      Medication List    STOP taking these medications   ibuprofen 200 MG tablet Commonly known as: ADVIL   naproxen 500 MG tablet Commonly known as: NAPROSYN     TAKE these medications   Accu-Chek Guide test strip Generic drug: glucose blood USE TO TEST BLOOD SUGAR 2 TO 3 TIMES A DAY   allopurinol 300 MG tablet Commonly known as: ZYLOPRIM Take 300 mg by mouth daily.   aspirin EC 81 MG tablet Take 81 mg by mouth every morning.   atorvastatin 40 MG tablet Commonly known as: LIPITOR Take 40 mg by mouth daily.   B-D ULTRAFINE III SHORT PEN 31G X 8 MM Misc Generic drug: Insulin Pen Needle USE UTD WITH FLEXPEN.   Basaglar KwikPen 100 UNIT/ML Sopn Inject 100 Units into the skin daily. In AM   benzonatate 100 MG capsule Commonly known as: Tessalon Perles Take 1 capsule (100 mg total) by mouth every 6 (six) hours as needed for  cough.   Biotin 5000 MCG Tabs Take 5,000 mcg by mouth daily.   bisoprolol 5 MG tablet Commonly known as: ZEBETA Take 10 mg by mouth daily.   fish oil-omega-3 fatty acids 1000 MG capsule Take 1 g by mouth 2 (two) times daily.   glucosamine-chondroitin 500-400 MG tablet Take 2 tablets by mouth daily.   guaiFENesin-dextromethorphan 100-10 MG/5ML syrup Commonly known as: ROBITUSSIN DM Take 10 mLs by mouth every 4 (four) hours as needed for cough.   Ipratropium-Albuterol 20-100 MCG/ACT Aers respimat Commonly known as: COMBIVENT Inhale 1 puff into the lungs every 6 (six) hours as needed for wheezing.   irbesartan 300 MG tablet Commonly known as: AVAPRO Take 300 mg by mouth daily.   L-Lysine HCl 1000 MG Tabs Take 1 tablet by mouth daily.   LORazepam 0.5 MG tablet Commonly known as: ATIVAN Take 0.5 mg by mouth daily as needed for anxiety.   magnesium oxide 400 MG tablet Commonly known as: MAG-OX Take 400 mg by mouth 2 (two) times daily.   nitroGLYCERIN  0.4 MG SL tablet Commonly known as: NITROSTAT Place 0.4 mg under the tongue as directed.   NovoLOG FlexPen 100 UNIT/ML FlexPen Generic drug: insulin aspart Inject 6-12 Units into the skin 3 (three) times daily as needed (with arge, high carbohydrate meals.).   omeprazole 10 MG capsule Commonly known as: PRILOSEC Take 20 mg by mouth daily as needed (reflux.).   predniSONE 10 MG tablet Commonly known as: DELTASONE Take 4 tablets (40 mg total) by mouth daily for 5 days.   tamsulosin 0.4 MG Caps capsule Commonly known as: FLOMAX Take 0.4 mg by mouth daily as needed. Kidney stone issues.   vitamin C 500 MG tablet Commonly known as: ASCORBIC ACID Take 500 mg by mouth daily.   Vitamin D3 125 MCG (5000 UT) Tabs Take 5,000 Units by mouth daily.   VITAMIN E PO Take 1 capsule by mouth daily.      Follow-up Information    Lawerance Cruel, MD. Schedule an appointment as soon as possible for a visit in 2 week(s).    Specialty: Family Medicine Contact information: [id number] [place] [address] [phone]        Lorretta Harp, MD .   Specialties: Cardiology, Radiology Contact information: [address] [place] [address] [phone]          Allergies  Allergen Reactions  . Gabapentin Other (See Comments)    "made me so dizzy I missed work for 2 days"  . Metformin And Related Shortness Of Breath  . Sulfa Antibiotics Shortness Of Breath and Rash  . Amoxicillin     rash Did it involve swelling of the face/tongue/throat, SOB, or low BP? N Did it involve sudden or severe rash/hives, skin peeling, or any reaction on the inside of your mouth or nose?  Y Did you need to seek medical attention at a hospital or doctor's office? N When did it last happen?"years and years ago" If all above answers are "NO", may proceed with cephalosporin use.   Marland Kitchen Lisinopril     Cough   . Doxycycline Rash    You were cared for by a hospitalist during your hospital stay. If you have any questions about your discharge medications or the care you received while you were in the hospital after you are discharged, you can call the unit and asked to speak with the hospitalist on call if the hospitalist that took care of you is not available. Once you are discharged, your primary care physician will handle any further medical issues. Please note that no refills for any discharge medications will be authorized once you are discharged, as it is imperative that you return to your primary care physician (or establish a relationship with a primary care physician if you do not have one) for your aftercare needs so that they can reassess your need for medications and monitor your lab values.   Procedures/Studies: Dg Chest Portable 1 View  Result Date: 01/13/2019 CLINICAL DATA:  COVID-19 positive, chest congestion. EXAM: PORTABLE CHEST 1 VIEW COMPARISON:  October 08, 2015. FINDINGS: The  heart size and mediastinal contours are within normal limits. Status post coronary bypass graft. Hypoinflation of the lungs is noted with minimal bibasilar subsegmental atelectasis. No pneumothorax or pleural effusion is noted. The visualized skeletal structures are unremarkable. IMPRESSION: Hypoinflation of the lungs is noted with minimal bibasilar subsegmental atelectasis. Electronically Signed   By: Marijo Conception M.D.   On: 01/13/2019 15:18  The results of significant diagnostics from this hospitalization (including imaging, microbiology, ancillary and laboratory) are listed below for reference.     Microbiology: Recent Results (from the past 240 hour(s))  Blood Culture (routine x 2)     Status: None (Preliminary result)   Collection Time: 01/13/19 12:10 AM   Specimen: BLOOD  Result Value Ref Range Status   Specimen Description BLOOD RIGHT ANTECUBITAL  Final   Special Requests   Final    BOTTLES DRAWN AEROBIC AND ANAEROBIC Blood Culture adequate volume   Culture   Final    NO GROWTH < 12 HOURS Performed at [facility], [address] [place], [place] [address]    Report Status PENDING  Incomplete  MRSA PCR Screening     Status: None   Collection Time: 01/13/19 11:16 PM   Specimen: Nasal Mucosa; Nasopharyngeal  Result Value Ref Range Status   MRSA by PCR NEGATIVE NEGATIVE Final    Comment:        The GeneXpert MRSA Assay (FDA approved for NASAL specimens only), is one component of a comprehensive MRSA colonization surveillance program. It is not intended to diagnose MRSA infection nor to guide or monitor treatment for MRSA infections. Performed at [facility], [place] [address], [place], [place] [address]   Blood Culture (routine x 2)     Status: None (Preliminary result)   Collection Time: 01/14/19  2:32 AM   Specimen: BLOOD  Result Value Ref Range Status   Specimen Description BLOOD RIGHT ANTECUBITAL  Final   Special Requests   Final     BOTTLES DRAWN AEROBIC ONLY Blood Culture adequate volume   Culture   Final    NO GROWTH < 12 HOURS Performed at [facility], [place] [address] [place], [place] [address]    Report Status PENDING  Incomplete     Labs: BNP (last 3 results) No results for input(s): BNP in the last 8760 hours. Basic Metabolic Panel: Recent Labs  Lab 01/13/19 1345 01/14/19 0010 01/15/19 0450  NA 135 139 138  K 5.0 4.6 4.9  CL 110 110 111  CO2 16* 22 18*  GLUCOSE 157* 117* 239*  BUN 33* 31* 38*  CREATININE 1.54* 1.62* 1.55*  CALCIUM 8.4* 8.2* 8.3*  MG  --  1.7 1.8  PHOS  --  2.8 3.2   Liver Function Tests: Recent Labs  Lab 01/13/19 1957 01/14/19 0010 01/15/19 0450  AST 53* 24 20  ALT 27 19 19   ALKPHOS 125 175* 126  BILITOT 2.3* 0.7 0.4  PROT 5.1* 5.9* 5.6*  ALBUMIN 2.4* 2.7* 2.5*   No results for input(s): LIPASE, AMYLASE in the last 168 hours. No results for input(s): AMMONIA in the last 168 hours. CBC: Recent Labs  Lab 01/13/19 1345 01/14/19 0010 01/15/19 0450  WBC 4.4 4.0 4.8  NEUTROABS 3.1 2.6 3.8  HGB 15.6 15.0 14.6  HCT 46.0 44.2 43.5  MCV 92.0 91.7 91.4  PLT 147* 153 155   Cardiac Enzymes: No results for input(s): CKTOTAL, CKMB, CKMBINDEX, TROPONINI in the last 168 hours. BNP: Invalid input(s): POCBNP CBG: Recent Labs  Lab 01/13/19 2253 01/14/19 1644 01/14/19 2054 01/15/19 0725  GLUCAP 97 201* 290* 195*   D-Dimer Recent Labs    01/14/19 0010 01/15/19 0450  DDIMER 0.95* 0.54*   Hgb A1c No results for input(s): HGBA1C in the last 72 hours. Lipid Profile Recent Labs    01/13/19 1957  TRIG 39   Thyroid function  studies Recent Labs    01/14/19 0010  TSH 1.543   Anemia work up Recent Labs    01/14/19 0010 01/15/19 0450  FERRITIN 225 279   Urinalysis    Component Value Date/Time   COLORURINE YELLOW 04/29/2011 0004   APPEARANCEUR CLEAR 04/29/2011 0004   LABSPEC 1.021 04/29/2011 0004   PHURINE 5.5 04/29/2011 0004   GLUCOSEU NEGATIVE  04/29/2011 0004   HGBUR NEGATIVE 04/29/2011 0004   BILIRUBINUR negative 03/31/2015 1723   BILIRUBINUR neg 08/16/2012 0912   KETONESUR negative 03/31/2015 1723   KETONESUR NEGATIVE 04/29/2011 0004   PROTEINUR negative 03/31/2015 1723   PROTEINUR neg 08/16/2012 0912   PROTEINUR NEGATIVE 04/29/2011 0004   UROBILINOGEN 0.2 03/31/2015 1723   UROBILINOGEN 0.2 04/29/2011 0004   NITRITE Negative 03/31/2015 1723   NITRITE neg 08/16/2012 0912   NITRITE NEGATIVE 04/29/2011 0004   LEUKOCYTESUR Negative 03/31/2015 1723   Sepsis Labs Invalid input(s): PROCALCITONIN,  WBC,  LACTICIDVEN Microbiology Recent Results (from the past 240 hour(s))  Blood Culture (routine x 2)     Status: None (Preliminary result)   Collection Time: 01/13/19 12:10 AM   Specimen: BLOOD  Result Value Ref Range Status   Specimen Description BLOOD RIGHT ANTECUBITAL  Final   Special Requests   Final    BOTTLES DRAWN AEROBIC AND ANAEROBIC Blood Culture adequate volume   Culture   Final    NO GROWTH < 12 HOURS Performed at [facility], [address] [place], [place] [address]    Report Status PENDING  Incomplete  MRSA PCR Screening     Status: None   Collection Time: 01/13/19 11:16 PM   Specimen: Nasal Mucosa; Nasopharyngeal  Result Value Ref Range Status   MRSA by PCR NEGATIVE NEGATIVE Final    Comment:        The GeneXpert MRSA Assay (FDA approved for NASAL specimens only), is one component of a comprehensive MRSA colonization surveillance program. It is not intended to diagnose MRSA infection nor to guide or monitor treatment for MRSA infections. Performed at [facility], [place] [address], [place], [place] [address]   Blood Culture (routine x 2)     Status: None (Preliminary result)   Collection Time: 01/14/19  2:32 AM   Specimen: BLOOD  Result Value Ref Range Status   Specimen Description BLOOD RIGHT ANTECUBITAL  Final   Special Requests   Final    BOTTLES DRAWN AEROBIC ONLY  Blood Culture adequate volume   Culture   Final    NO GROWTH < 12 HOURS Performed at [facility], [place] [address], [place],  [address]    Report Status PENDING  Incomplete     Time coordinating discharge:  I have spent 35 minutes face to face with the patient and on the ward discussing the patients care, assessment, plan and disposition with other care givers. >50% of the time was devoted counseling the patient about the risks and benefits of treatment/Discharge disposition and coordinating care.   SIGNED:   Damita Lack, MD  [facility] 01/15/2019, 10:50 AM   If 7PM-7AM, please contact night-coverage

## 2019-01-15 NOTE — Progress Notes (Signed)
Reviewed discharge instructions with patient's wife [name] and patient. Educated on medication changes. Temperature checks, quarantine for 14 days and when to follow up with PCP and cardiology. All questions answered.

## 2019-01-15 NOTE — Discharge Instructions (Signed)
Person Under Monitoring Name: Mitchell Lambert  Location: [address] [place] [address]   Infection Prevention Recommendations for Individuals Confirmed to have, or Being Evaluated for, 2019 Novel Coronavirus (COVID-19) Infection Who Receive Care at Home  Individuals who are confirmed to have, or are being evaluated for, COVID-19 should follow the prevention steps below until a healthcare provider or local or state health department says they can return to normal activities.  Stay home except to get medical care You should restrict activities outside your home, except for getting medical care. Do not go to work, school, or public areas, and do not use public transportation or taxis.  Call ahead before visiting your doctor Before your medical appointment, call the healthcare provider and tell them that you have, or are being evaluated for, COVID-19 infection. This will help the healthcare providers office take steps to keep other people from getting infected. Ask your healthcare provider to call the local or state health department.  Monitor your symptoms Seek prompt medical attention if your illness is worsening (e.g., difficulty breathing). Before going to your medical appointment, call the healthcare provider and tell them that you have, or are being evaluated for, COVID-19 infection. Ask your healthcare provider to call the local or state health department.  Wear a facemask You should wear a facemask that covers your nose and mouth when you are in the same room with other people and when you visit a healthcare provider. People who live with or visit you should also wear a facemask while they are in the same room with you.  Separate yourself from other people in your home As much as possible, you should stay in a different room from other people in your home. Also, you should use a separate bathroom, if available.  Avoid sharing household items You should not share  dishes, drinking glasses, cups, eating utensils, towels, bedding, or other items with other people in your home. After using these items, you should wash them thoroughly with soap and water.  Cover your coughs and sneezes Cover your mouth and nose with a tissue when you cough or sneeze, or you can cough or sneeze into your sleeve. Throw used tissues in a lined trash can, and immediately wash your hands with soap and water for at least 20 seconds or use an alcohol-based hand rub.  Wash your [facility] your hands often and thoroughly with soap and water for at least 20 seconds. You can use an alcohol-based hand sanitizer if soap and water are not available and if your hands are not visibly dirty. Avoid touching your eyes, nose, and mouth with unwashed hands.   Prevention Steps for Caregivers and Household Members of Individuals Confirmed to have, or Being Evaluated for, COVID-19 Infection Being Cared for in the Home  If you live with, or provide care at home for, a person confirmed to have, or being evaluated for, COVID-19 infection please follow these guidelines to prevent infection:  Follow healthcare providers instructions Make sure that you understand and can help the patient follow any healthcare provider instructions for all care.  Provide for the patients basic needs You should help the patient with basic needs in the home and provide support for getting groceries, prescriptions, and other personal needs.  Monitor the patients symptoms If they are getting sicker, call his or her medical provider and tell them that the patient has, or is being evaluated for, COVID-19 infection. This will help the healthcare providers office  take steps to keep other people from getting infected. Ask the healthcare provider to call the local or state health department.  Limit the number of people who have contact with the patient  If possible, have only one caregiver for the patient.  Other  household members should stay in another home or place of residence. If this is not possible, they should stay  in another room, or be separated from the patient as much as possible. Use a separate bathroom, if available.  Restrict visitors who do not have an essential need to be in the home.  Keep older adults, very young children, and other sick people away from the patient Keep older adults, very young children, and those who have compromised immune systems or chronic health conditions away from the patient. This includes people with chronic heart, lung, or kidney conditions, diabetes, and cancer.  Ensure good ventilation Make sure that shared spaces in the home have good air flow, such as from an air conditioner or an opened window, weather permitting.  Wash your hands often  Wash your hands often and thoroughly with soap and water for at least 20 seconds. You can use an alcohol based hand sanitizer if soap and water are not available and if your hands are not visibly dirty.  Avoid touching your eyes, nose, and mouth with unwashed hands.  Use disposable paper towels to dry your hands. If not available, use dedicated cloth towels and replace them when they become wet.  Wear a facemask and gloves  Wear a disposable facemask at all times in the room and gloves when you touch or have contact with the patients blood, body fluids, and/or secretions or excretions, such as sweat, saliva, sputum, nasal mucus, vomit, urine, or feces.  Ensure the mask fits over your nose and mouth tightly, and do not touch it during use.  Throw out disposable facemasks and gloves after using them. Do not reuse.  Wash your hands immediately after removing your facemask and gloves.  If your personal clothing becomes contaminated, carefully remove clothing and launder. Wash your hands after handling contaminated clothing.  Place all used disposable facemasks, gloves, and other waste in a lined container before  disposing them with other household waste.  Remove gloves and wash your hands immediately after handling these items.  Do not share dishes, glasses, or other household items with the patient  Avoid sharing household items. You should not share dishes, drinking glasses, cups, eating utensils, towels, bedding, or other items with a patient who is confirmed to have, or being evaluated for, COVID-19 infection.  After the person uses these items, you should wash them thoroughly with soap and water.  Wash laundry thoroughly  Immediately remove and wash clothes or bedding that have blood, body fluids, and/or secretions or excretions, such as sweat, saliva, sputum, nasal mucus, vomit, urine, or feces, on them.  Wear gloves when handling laundry from the patient.  Read and follow directions on labels of laundry or clothing items and detergent. In general, wash and dry with the warmest temperatures recommended on the label.  Clean all areas the individual has used often  Clean all touchable surfaces, such as counters, tabletops, doorknobs, bathroom fixtures, toilets, phones, keyboards, tablets, and bedside tables, every day. Also, clean any surfaces that may have blood, body fluids, and/or secretions or excretions on them.  Wear gloves when cleaning surfaces the patient has come in contact with.  Use a diluted bleach solution (e.g., dilute bleach with 1 part  bleach and 10 parts water) or a household disinfectant with a label that says EPA-registered for coronaviruses. To make a bleach solution at home, add 1 tablespoon of bleach to 1 quart (4 cups) of water. For a larger supply, add  cup of bleach to 1 gallon (16 cups) of water.  Read labels of cleaning products and follow recommendations provided on product labels. Labels contain instructions for safe and effective use of the cleaning product including precautions you should take when applying the product, such as wearing gloves or eye protection  and making sure you have good ventilation during use of the product.  Remove gloves and wash hands immediately after cleaning.  Monitor yourself for signs and symptoms of illness Caregivers and household members are considered close contacts, should monitor their health, and will be asked to limit movement outside of the home to the extent possible. Follow the monitoring steps for close contacts listed on the symptom monitoring form.   ? If you have additional questions, contact your local health department or call the epidemiologist on call at [phone] (available 24/7). ? This guidance is subject to change. For the most up-to-date guidance from [facility], please refer to their website: [email or web address]

## 2019-01-17 LAB — GLUCOSE, CAPILLARY
Glucose-Capillary: 97 mg/dL (ref 70–99)
Glucose-Capillary: 101 mg/dL — ABNORMAL HIGH (ref 70–99)

## 2019-01-19 LAB — CULTURE, BLOOD (ROUTINE X 2)
Culture: NO GROWTH
Culture: NO GROWTH
Special Requests: ADEQUATE
Special Requests: ADEQUATE

## 2019-01-23 DIAGNOSIS — Z09 Encounter for follow-up examination after completed treatment for conditions other than malignant neoplasm: Secondary | ICD-10-CM | POA: Diagnosis not present

## 2019-01-23 DIAGNOSIS — U071 COVID-19: Secondary | ICD-10-CM | POA: Diagnosis not present

## 2019-01-31 ENCOUNTER — Other Ambulatory Visit: Payer: Self-pay

## 2019-03-06 DIAGNOSIS — Z0000 Encounter for general adult medical examination without abnormal findings: Secondary | ICD-10-CM | POA: Diagnosis not present

## 2019-03-06 DIAGNOSIS — N529 Male erectile dysfunction, unspecified: Secondary | ICD-10-CM | POA: Diagnosis not present

## 2019-03-06 DIAGNOSIS — F418 Other specified anxiety disorders: Secondary | ICD-10-CM | POA: Diagnosis not present

## 2019-03-06 DIAGNOSIS — Z125 Encounter for screening for malignant neoplasm of prostate: Secondary | ICD-10-CM | POA: Diagnosis not present

## 2019-03-06 DIAGNOSIS — I10 Essential (primary) hypertension: Secondary | ICD-10-CM | POA: Diagnosis not present

## 2019-03-06 DIAGNOSIS — M109 Gout, unspecified: Secondary | ICD-10-CM | POA: Diagnosis not present

## 2019-03-06 DIAGNOSIS — K219 Gastro-esophageal reflux disease without esophagitis: Secondary | ICD-10-CM | POA: Diagnosis not present

## 2019-03-06 DIAGNOSIS — E7800 Pure hypercholesterolemia, unspecified: Secondary | ICD-10-CM | POA: Diagnosis not present

## 2019-03-06 DIAGNOSIS — E1169 Type 2 diabetes mellitus with other specified complication: Secondary | ICD-10-CM | POA: Diagnosis not present

## 2019-03-06 DIAGNOSIS — Z794 Long term (current) use of insulin: Secondary | ICD-10-CM | POA: Diagnosis not present

## 2019-03-16 DIAGNOSIS — E1169 Type 2 diabetes mellitus with other specified complication: Secondary | ICD-10-CM | POA: Diagnosis not present

## 2019-03-16 DIAGNOSIS — Z794 Long term (current) use of insulin: Secondary | ICD-10-CM | POA: Diagnosis not present

## 2019-03-16 DIAGNOSIS — M79673 Pain in unspecified foot: Secondary | ICD-10-CM | POA: Diagnosis not present

## 2019-03-16 DIAGNOSIS — N529 Male erectile dysfunction, unspecified: Secondary | ICD-10-CM | POA: Diagnosis not present

## 2019-04-18 ENCOUNTER — Encounter: Payer: Self-pay | Admitting: Cardiovascular Disease

## 2019-04-18 ENCOUNTER — Other Ambulatory Visit: Payer: Self-pay

## 2019-04-18 ENCOUNTER — Ambulatory Visit (INDEPENDENT_AMBULATORY_CARE_PROVIDER_SITE_OTHER): Payer: Medicare Other | Admitting: Cardiovascular Disease

## 2019-04-18 DIAGNOSIS — E782 Mixed hyperlipidemia: Secondary | ICD-10-CM | POA: Diagnosis not present

## 2019-04-18 DIAGNOSIS — Z951 Presence of aortocoronary bypass graft: Secondary | ICD-10-CM

## 2019-04-18 DIAGNOSIS — I10 Essential (primary) hypertension: Secondary | ICD-10-CM

## 2019-04-18 NOTE — Assessment & Plan Note (Signed)
History of hyperlipidemia on atorvastatin 40 mg a day with lipid profile performed 03/06/2019 revealing a total cholesterol of 178, LDL 110 HDL 35.  We will explore starting Repatha.

## 2019-04-18 NOTE — Progress Notes (Signed)
04/18/2019 Mitchell Lambert   05-22-1950  [id number]  Primary Physician Mitchell Cruel, MD Primary Cardiologist: Mitchell Harp MD Mitchell Lambert, [place]  HPI:  Mitchell Lambert is a 69 y.o.  moderately overweight married Caucasian male father 58, grandfather of 7 grandchildren referred by Mitchell Lambert to be established in my practice for ongoing cardiovascular care.  He previously was taken care of by Mitchell Lambert.  He works as a Mitchell Lambert.  I last saw him in the office 04/08/2018. His risk factors include treated hypertension, diabetes and hyperlipidemia.  There is no family history for heart disease.  He is never had a heart attack or stroke.  He did undergo cardiac catheterization because of chest pain Mitchell Lambert February 2013 revealing three-vessel disease with preserved LV function and underwent CABG x4 by Mitchell Lambert 04/28/2011 with a LIMA to the LAD, left radial to OM1, vein to diagonal branch and PDA.  He apparently had a Myoview stress test sometime after which which was negative.  He gets occasional atypical chest pain once or twice a year.  Since I saw him a year ago he did come down with COVID-19 in November and was hospitalized for 2 days.  His troponins were mildly elevated.  2D echocardiogram performed 01/13/2019 was entirely normal.   Current Meds  Medication Sig  . ACCU-CHEK GUIDE test strip USE TO TEST BLOOD SUGAR 2 TO 3 TIMES A DAY  . allopurinol (ZYLOPRIM) 300 MG tablet Take 300 mg by mouth daily.  Marland Kitchen aspirin EC 81 MG tablet Take 81 mg by mouth every morning.   Marland Kitchen atorvastatin (LIPITOR) 40 MG tablet Take 40 mg by mouth daily.  . B-D ULTRAFINE III SHORT PEN 31G X 8 MM MISC USE UTD WITH FLEXPEN.  . Biotin 5000 MCG TABS Take 5,000 mcg by mouth daily.   . bisoprolol (ZEBETA) 10 MG tablet Take 10 mg by mouth daily.  . Cholecalciferol (VITAMIN D3) 5000 UNITS TABS Take 5,000 Units by mouth daily.   . fish oil-omega-3 fatty acids 1000 MG capsule Take 1  g by mouth 2 (two) times daily.  Marland Kitchen glucosamine-chondroitin 500-400 MG tablet Take 2 tablets by mouth daily.  . insulin aspart (NOVOLOG FLEXPEN) 100 UNIT/ML FlexPen Inject into the skin as needed for high blood sugar.  . Insulin Glargine (BASAGLAR KWIKPEN) 100 UNIT/ML SOPN Inject 100 Units into the skin daily. In AM  . irbesartan (AVAPRO) 300 MG tablet Take 300 mg by mouth daily.  Marland Kitchen L-Lysine HCl 1000 MG TABS Take 1 tablet by mouth daily.  Marland Kitchen LORazepam (ATIVAN) 0.5 MG tablet Take 0.5 mg by mouth daily as needed for anxiety.  . magnesium oxide (MAG-OX) 400 MG tablet Take 400 mg by mouth 2 (two) times daily.   . nitroGLYCERIN (NITROSTAT) 0.4 MG SL tablet Place 0.4 mg under the tongue as directed.  Marland Kitchen omeprazole (PRILOSEC) 20 MG capsule Take 20 mg by mouth daily as needed.  . tamsulosin (FLOMAX) 0.4 MG CAPS capsule Take 0.4 mg by mouth daily as needed. Kidney stone issues.  . vitamin C (ASCORBIC ACID) 500 MG tablet Take 500 mg by mouth daily.   Marland Kitchen VITAMIN E PO Take 1 capsule by mouth daily.     Allergies  Allergen Reactions  . Gabapentin Other (See Comments)    "made me so dizzy I missed work for 2 days"  . Metformin And Related Shortness Of Breath  . Sulfa Antibiotics Shortness Of Breath  and Rash  . Amoxicillin     rash Did it involve swelling of the face/tongue/throat, SOB, or low BP? N Did it involve sudden or severe rash/hives, skin peeling, or any reaction on the inside of your mouth or nose?  Y Did you need to seek medical attention at a hospital or doctor's office? N When did it last happen?"years and years ago" If all above answers are "NO", may proceed with cephalosporin use.   Marland Kitchen Lisinopril     Cough   . Doxycycline Rash    Social History   Socioeconomic History  . Marital status: Married    Spouse name: Not on file  . Number of children: Not on file  . Years of education: Not on file  . Highest education level: Not on file  Occupational History  . Not on file    Tobacco Use  . Smoking status: Never Smoker  . Smokeless tobacco: Never Used  Substance and Sexual Activity  . Alcohol use: Yes    Comment:  OCCAS BEER  OR MIXED DRINK LAST MARIJUANA COUPLE MONTHS AGO  . Drug use: Yes    Types: Marijuana    Comment: last marijuana use ~ 2010; "very rare". since  . Sexual activity: Yes  Other Topics Concern  . 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         Social Determinants of Mitchell   Financial Resource Strain:   . Difficulty of Paying Living Expenses: Not on file  Food Insecurity:   . Worried About Charity fundraiser in the Last Year: Not on file  . Ran Out of Food in the Last Year: Not on file  Transportation Needs:   . Lack of Transportation (Medical): Not on file  . Lack of Transportation (Non-Medical): Not on file  Physical Activity:   . Days of Exercise per Week: Not on file  . Minutes of Exercise per Session: Not on file  Stress:   . Feeling of Stress : Not on file  Social Connections:   . Frequency of Communication with Friends and Family: Not on file  . Frequency of Social Gatherings with Friends and Family: Not on file  . Attends Religious Services: Not on file  . Active Member of Clubs or Organizations: Not on file  . Attends Archivist Meetings: Not on file  . Marital Status: Not on file  Intimate Partner Violence:   . Fear of Current or Ex-Partner: Not on file  . Emotionally Abused: Not on file  . Physically Abused: Not on file  . Sexually Abused: Not on file     Review of Systems: General: negative for chills, fever, night sweats or weight changes.  Cardiovascular: negative for chest pain, dyspnea on exertion, edema, orthopnea, palpitations, paroxysmal nocturnal dyspnea or shortness of breath Dermatological: negative for rash Respiratory: negative for cough or wheezing Urologic: negative for hematuria Abdominal: negative for nausea,  vomiting, diarrhea, bright red blood per rectum, melena, or hematemesis Neurologic: negative for visual changes, syncope, or dizziness All other systems reviewed and are otherwise negative except as noted above.    Blood pressure (!) 156/84, pulse (!) 55, height 5\' 11"  (1.803 m), weight 242 lb (109.8 kg).  General appearance: alert and no distress Neck: no adenopathy, no carotid bruit, no JVD, supple, symmetrical, trachea midline and thyroid not enlarged, symmetric, no tenderness/mass/nodules Lungs: clear to auscultation bilaterally Heart: regular rate and  rhythm, S1, S2 normal, no murmur, click, rub or gallop Extremities: extremities normal, atraumatic, no cyanosis or edema Pulses: 2+ and symmetric Skin: Skin color, texture, turgor normal. No rashes or lesions Neurologic: Alert and oriented X 3, normal strength and tone. Normal symmetric reflexes. Normal coordination and gait  EKG not performed today  ASSESSMENT AND PLAN:   Hyperlipidemia History of hyperlipidemia on atorvastatin 40 mg a day with lipid profile performed 03/06/2019 revealing a total cholesterol of 178, LDL 110 HDL 35.  We will explore starting Repatha.  Hypertension History of essential hypertension with blood pressure measured today at 156/84.  He is on Zebeta and Avapro.  His creatinines run in the mid 1 range.  He does admit to dietary indiscretion with regards to salt which we discussed.  S/P CABG (coronary artery bypass graft) History of CAD status post coronary artery bypass grafting X 4 by Mitchell Lambert in 2013 with a LIMA to his LAD, left radial to OM1, vein to diagonal branch and PDA.  LV function was normal.  Myoview stress test since that time has been negative.  Gets occasional atypical chest pain.      Mitchell Harp MD FACP,FACC,FAHA, [facility] 04/18/2019 4:26 PM

## 2019-04-18 NOTE — Patient Instructions (Addendum)
Medication Instructions:  Start taking Repatha   If you need a refill on your cardiac medications before your next appointment, please call your pharmacy.   Lab work: Lipids and Hepatic Function in 2 months If you have labs (blood work) drawn today and your tests are completely normal, you will receive your results only by: [place] (if you have MyChart) OR A paper copy in the mail If you have any lab test that is abnormal or we need to change your treatment, we will call you to review the results.  Testing/Procedures: NONE  Follow-Up: At [facility], you and your health needs are our priority.  As part of our continuing mission to provide you with exceptional heart care, we have created designated Provider Care Teams.  These Care Teams include your primary Cardiologist (physician) and Advanced Practice Providers (APPs -  Physician Assistants and Nurse Practitioners) who all work together to provide you with the care you need, when you need it. You may see Quay Burow, MD or one of the following Advanced Practice Providers on your designated Care Team:    Kerin Ransom, PA-C  [place], [place]  Coletta Memos, [place]  Your physician wants you to follow-up in: 1 year with Dr. Gwenlyn Found. You will receive a reminder letter in the mail two months in advance. If you don't receive a letter, please call our office to schedule the follow-up appointment.  Any Other Special Instructions Will Be Listed Below (If Applicable).   Patient Assistance:  The [employer] foundation offers assistance to help pay for medication copays.  They will cover copays for all cholesterol lowering meds, including statins, fibrates, omega-3 oils, ezetimibe, Repatha, Praluent, Nexletol, Nexlizet.  The cards are usually good for $2,500 or 12 months, whichever comes first. 1. Go to healthwellfoundation.org 2. Click on "Apply Now" 3. Answer questions as to whom is applying (patient or representative) 4. Your  disease fund will be "hypercholesterolemia - Medicare access" 5. They will ask questions about finances and which medications you are taking for cholesterol 6. When you submit, the approval is usually within minutes.  You will need to print the card information from the site 7. You will need to show this information to your pharmacy, they will bill your Medicare Part D plan first -then bill [employer] --for the copay.   You can also call them at [phone], although the hold times can be quite long.   Thank you for choosing [facility]

## 2019-04-18 NOTE — Assessment & Plan Note (Signed)
History of essential hypertension with blood pressure measured today at 156/84.  He is on Zebeta and Avapro.  His creatinines run in the mid 1 range.  He does admit to dietary indiscretion with regards to salt which we discussed.

## 2019-04-18 NOTE — Assessment & Plan Note (Signed)
History of CAD status post coronary artery bypass grafting X 4 by Dr. Roxan Hockey in 2013 with a LIMA to his LAD, left radial to OM1, vein to diagonal branch and PDA.  LV function was normal.  Myoview stress test since that time has been negative.  Gets occasional atypical chest pain.

## 2019-04-20 ENCOUNTER — Telehealth: Payer: Self-pay

## 2019-04-20 MED ORDER — PRALUENT 150 MG/ML ~~LOC~~ SOAJ: 150.0000 mg | SUBCUTANEOUS | 11 refills | Status: DC

## 2019-04-20 NOTE — Telephone Encounter (Signed)
lmomed the pt to start praluent 150mg , rx sent, instructed pt to call back if issues arise

## 2019-04-26 DIAGNOSIS — E1169 Type 2 diabetes mellitus with other specified complication: Secondary | ICD-10-CM | POA: Diagnosis not present

## 2019-04-26 DIAGNOSIS — J45991 Cough variant asthma: Secondary | ICD-10-CM | POA: Diagnosis not present

## 2019-04-26 DIAGNOSIS — Z794 Long term (current) use of insulin: Secondary | ICD-10-CM | POA: Diagnosis not present

## 2019-04-26 DIAGNOSIS — E7800 Pure hypercholesterolemia, unspecified: Secondary | ICD-10-CM | POA: Diagnosis not present

## 2019-04-26 DIAGNOSIS — I10 Essential (primary) hypertension: Secondary | ICD-10-CM | POA: Diagnosis not present

## 2019-05-04 DIAGNOSIS — I872 Venous insufficiency (chronic) (peripheral): Secondary | ICD-10-CM | POA: Diagnosis not present

## 2019-05-04 DIAGNOSIS — L80 Vitiligo: Secondary | ICD-10-CM | POA: Diagnosis not present

## 2019-06-26 DIAGNOSIS — I10 Essential (primary) hypertension: Secondary | ICD-10-CM | POA: Diagnosis not present

## 2019-06-26 DIAGNOSIS — J45991 Cough variant asthma: Secondary | ICD-10-CM | POA: Diagnosis not present

## 2019-06-26 DIAGNOSIS — E1169 Type 2 diabetes mellitus with other specified complication: Secondary | ICD-10-CM | POA: Diagnosis not present

## 2019-06-26 DIAGNOSIS — E7800 Pure hypercholesterolemia, unspecified: Secondary | ICD-10-CM | POA: Diagnosis not present

## 2019-06-26 DIAGNOSIS — N183 Chronic kidney disease, stage 3 unspecified: Secondary | ICD-10-CM | POA: Diagnosis not present

## 2019-07-07 DIAGNOSIS — Z1152 Encounter for screening for COVID-19: Secondary | ICD-10-CM | POA: Diagnosis not present

## 2019-07-07 DIAGNOSIS — R05 Cough: Secondary | ICD-10-CM | POA: Diagnosis not present

## 2019-07-15 DIAGNOSIS — H6123 Impacted cerumen, bilateral: Secondary | ICD-10-CM | POA: Diagnosis not present

## 2019-07-15 DIAGNOSIS — H9193 Unspecified hearing loss, bilateral: Secondary | ICD-10-CM | POA: Diagnosis not present

## 2019-07-15 DIAGNOSIS — R42 Dizziness and giddiness: Secondary | ICD-10-CM | POA: Diagnosis not present

## 2019-07-15 DIAGNOSIS — H9313 Tinnitus, bilateral: Secondary | ICD-10-CM | POA: Diagnosis not present

## 2019-09-13 DIAGNOSIS — N183 Chronic kidney disease, stage 3 unspecified: Secondary | ICD-10-CM | POA: Diagnosis not present

## 2019-09-13 DIAGNOSIS — N529 Male erectile dysfunction, unspecified: Secondary | ICD-10-CM | POA: Diagnosis not present

## 2019-09-13 DIAGNOSIS — M79673 Pain in unspecified foot: Secondary | ICD-10-CM | POA: Diagnosis not present

## 2019-09-13 DIAGNOSIS — E1169 Type 2 diabetes mellitus with other specified complication: Secondary | ICD-10-CM | POA: Diagnosis not present

## 2019-09-13 DIAGNOSIS — Z0000 Encounter for general adult medical examination without abnormal findings: Secondary | ICD-10-CM | POA: Diagnosis not present

## 2019-09-13 DIAGNOSIS — E7800 Pure hypercholesterolemia, unspecified: Secondary | ICD-10-CM | POA: Diagnosis not present

## 2019-09-13 DIAGNOSIS — M109 Gout, unspecified: Secondary | ICD-10-CM | POA: Diagnosis not present

## 2019-09-13 DIAGNOSIS — F419 Anxiety disorder, unspecified: Secondary | ICD-10-CM | POA: Diagnosis not present

## 2019-09-13 DIAGNOSIS — I10 Essential (primary) hypertension: Secondary | ICD-10-CM | POA: Diagnosis not present

## 2019-09-13 DIAGNOSIS — Z794 Long term (current) use of insulin: Secondary | ICD-10-CM | POA: Diagnosis not present

## 2019-09-20 DIAGNOSIS — R52 Pain, unspecified: Secondary | ICD-10-CM | POA: Diagnosis not present

## 2019-09-20 DIAGNOSIS — Z20822 Contact with and (suspected) exposure to covid-19: Secondary | ICD-10-CM | POA: Diagnosis not present

## 2019-09-26 DIAGNOSIS — N183 Chronic kidney disease, stage 3 unspecified: Secondary | ICD-10-CM | POA: Diagnosis not present

## 2019-09-26 DIAGNOSIS — E1169 Type 2 diabetes mellitus with other specified complication: Secondary | ICD-10-CM | POA: Diagnosis not present

## 2019-09-26 DIAGNOSIS — E7800 Pure hypercholesterolemia, unspecified: Secondary | ICD-10-CM | POA: Diagnosis not present

## 2019-09-26 DIAGNOSIS — I10 Essential (primary) hypertension: Secondary | ICD-10-CM | POA: Diagnosis not present

## 2019-09-26 DIAGNOSIS — J45991 Cough variant asthma: Secondary | ICD-10-CM | POA: Diagnosis not present

## 2019-10-11 DIAGNOSIS — Z23 Encounter for immunization: Secondary | ICD-10-CM | POA: Diagnosis not present

## 2019-10-11 DIAGNOSIS — M79673 Pain in unspecified foot: Secondary | ICD-10-CM | POA: Diagnosis not present

## 2019-10-11 DIAGNOSIS — M109 Gout, unspecified: Secondary | ICD-10-CM | POA: Diagnosis not present

## 2019-10-11 DIAGNOSIS — F419 Anxiety disorder, unspecified: Secondary | ICD-10-CM | POA: Diagnosis not present

## 2019-10-11 DIAGNOSIS — E1169 Type 2 diabetes mellitus with other specified complication: Secondary | ICD-10-CM | POA: Diagnosis not present

## 2019-10-11 DIAGNOSIS — J45991 Cough variant asthma: Secondary | ICD-10-CM | POA: Diagnosis not present

## 2019-10-11 DIAGNOSIS — N183 Chronic kidney disease, stage 3 unspecified: Secondary | ICD-10-CM | POA: Diagnosis not present

## 2019-10-11 DIAGNOSIS — R52 Pain, unspecified: Secondary | ICD-10-CM | POA: Diagnosis not present

## 2019-10-11 DIAGNOSIS — I10 Essential (primary) hypertension: Secondary | ICD-10-CM | POA: Diagnosis not present

## 2019-10-11 DIAGNOSIS — Z20822 Contact with and (suspected) exposure to covid-19: Secondary | ICD-10-CM | POA: Diagnosis not present

## 2019-10-11 DIAGNOSIS — Z0000 Encounter for general adult medical examination without abnormal findings: Secondary | ICD-10-CM | POA: Diagnosis not present

## 2019-10-11 DIAGNOSIS — N529 Male erectile dysfunction, unspecified: Secondary | ICD-10-CM | POA: Diagnosis not present

## 2019-10-11 DIAGNOSIS — E7800 Pure hypercholesterolemia, unspecified: Secondary | ICD-10-CM | POA: Diagnosis not present

## 2019-10-11 DIAGNOSIS — Z794 Long term (current) use of insulin: Secondary | ICD-10-CM | POA: Diagnosis not present

## 2019-11-01 DIAGNOSIS — Z23 Encounter for immunization: Secondary | ICD-10-CM | POA: Diagnosis not present

## 2020-02-22 DIAGNOSIS — B349 Viral infection, unspecified: Secondary | ICD-10-CM | POA: Diagnosis not present

## 2020-02-22 DIAGNOSIS — U071 COVID-19: Secondary | ICD-10-CM | POA: Diagnosis not present

## 2020-02-22 DIAGNOSIS — R0989 Other specified symptoms and signs involving the circulatory and respiratory systems: Secondary | ICD-10-CM | POA: Diagnosis not present

## 2020-04-29 DIAGNOSIS — M5459 Other low back pain: Secondary | ICD-10-CM | POA: Diagnosis not present

## 2020-05-27 ENCOUNTER — Emergency Department (HOSPITAL_COMMUNITY): Payer: Medicare Other

## 2020-05-27 ENCOUNTER — Observation Stay (HOSPITAL_COMMUNITY): Payer: Medicare Other

## 2020-05-27 ENCOUNTER — Encounter (HOSPITAL_COMMUNITY): Payer: Self-pay

## 2020-05-27 ENCOUNTER — Observation Stay (HOSPITAL_COMMUNITY)
Admission: EM | Admit: 2020-05-27 | Discharge: 2020-05-28 | Disposition: A | Discharge status: Home / Self Care | Payer: Medicare Other | Attending: Internal Medicine | Admitting: Internal Medicine

## 2020-05-27 ENCOUNTER — Other Ambulatory Visit: Payer: Self-pay

## 2020-05-27 DIAGNOSIS — Z79899 Other long term (current) drug therapy: Secondary | ICD-10-CM | POA: Insufficient documentation

## 2020-05-27 DIAGNOSIS — E782 Mixed hyperlipidemia: Secondary | ICD-10-CM

## 2020-05-27 DIAGNOSIS — R2990 Unspecified symptoms and signs involving the nervous system: Secondary | ICD-10-CM

## 2020-05-27 DIAGNOSIS — I2510 Atherosclerotic heart disease of native coronary artery without angina pectoris: Secondary | ICD-10-CM | POA: Diagnosis not present

## 2020-05-27 DIAGNOSIS — N1832 Chronic kidney disease, stage 3b: Secondary | ICD-10-CM | POA: Diagnosis not present

## 2020-05-27 DIAGNOSIS — R29818 Other symptoms and signs involving the nervous system: Secondary | ICD-10-CM | POA: Diagnosis not present

## 2020-05-27 DIAGNOSIS — R410 Disorientation, unspecified: Secondary | ICD-10-CM | POA: Diagnosis not present

## 2020-05-27 DIAGNOSIS — Z794 Long term (current) use of insulin: Secondary | ICD-10-CM | POA: Diagnosis not present

## 2020-05-27 DIAGNOSIS — E785 Hyperlipidemia, unspecified: Secondary | ICD-10-CM | POA: Diagnosis present

## 2020-05-27 DIAGNOSIS — I129 Hypertensive chronic kidney disease with stage 1 through stage 4 chronic kidney disease, or unspecified chronic kidney disease: Secondary | ICD-10-CM | POA: Diagnosis not present

## 2020-05-27 DIAGNOSIS — H539 Unspecified visual disturbance: Secondary | ICD-10-CM

## 2020-05-27 DIAGNOSIS — Z951 Presence of aortocoronary bypass graft: Secondary | ICD-10-CM

## 2020-05-27 DIAGNOSIS — E1122 Type 2 diabetes mellitus with diabetic chronic kidney disease: Secondary | ICD-10-CM | POA: Diagnosis not present

## 2020-05-27 DIAGNOSIS — Z7982 Long term (current) use of aspirin: Secondary | ICD-10-CM | POA: Insufficient documentation

## 2020-05-27 DIAGNOSIS — E669 Obesity, unspecified: Secondary | ICD-10-CM | POA: Diagnosis present

## 2020-05-27 DIAGNOSIS — Z20822 Contact with and (suspected) exposure to covid-19: Secondary | ICD-10-CM | POA: Insufficient documentation

## 2020-05-27 DIAGNOSIS — R471 Dysarthria and anarthria: Secondary | ICD-10-CM | POA: Diagnosis not present

## 2020-05-27 DIAGNOSIS — R4701 Aphasia: Principal | ICD-10-CM | POA: Insufficient documentation

## 2020-05-27 DIAGNOSIS — E119 Type 2 diabetes mellitus without complications: Secondary | ICD-10-CM | POA: Diagnosis not present

## 2020-05-27 DIAGNOSIS — I10 Essential (primary) hypertension: Secondary | ICD-10-CM | POA: Diagnosis not present

## 2020-05-27 DIAGNOSIS — N179 Acute kidney failure, unspecified: Secondary | ICD-10-CM

## 2020-05-27 DIAGNOSIS — I160 Hypertensive urgency: Secondary | ICD-10-CM

## 2020-05-27 DIAGNOSIS — N189 Chronic kidney disease, unspecified: Secondary | ICD-10-CM

## 2020-05-27 DIAGNOSIS — J45909 Unspecified asthma, uncomplicated: Secondary | ICD-10-CM | POA: Diagnosis not present

## 2020-05-27 DIAGNOSIS — I639 Cerebral infarction, unspecified: Secondary | ICD-10-CM | POA: Diagnosis not present

## 2020-05-27 LAB — RESP PANEL BY RT-PCR (FLU A&B, COVID) ARPGX2
Influenza A by PCR: NEGATIVE
Influenza B by PCR: NEGATIVE
SARS Coronavirus 2 by RT PCR: NEGATIVE

## 2020-05-27 LAB — DIFFERENTIAL
Abs Immature Granulocytes: 0.02 10*3/uL (ref 0.00–0.07)
Basophils Absolute: 0 10*3/uL (ref 0.0–0.1)
Basophils Relative: 1 %
Eosinophils Absolute: 0.2 10*3/uL (ref 0.0–0.5)
Eosinophils Relative: 3 %
Immature Granulocytes: 0 %
Lymphocytes Relative: 23 %
Lymphs Abs: 1.5 10*3/uL (ref 0.7–4.0)
Monocytes Absolute: 0.6 10*3/uL (ref 0.1–1.0)
Monocytes Relative: 9 %
Neutro Abs: 4.2 10*3/uL (ref 1.7–7.7)
Neutrophils Relative %: 64 %

## 2020-05-27 LAB — PROTIME-INR
INR: 1 (ref 0.8–1.2)
Prothrombin Time: 12.3 seconds (ref 11.4–15.2)

## 2020-05-27 LAB — URINALYSIS, ROUTINE W REFLEX MICROSCOPIC
Bacteria, UA: NONE SEEN
Bilirubin Urine: NEGATIVE
Glucose, UA: 150 mg/dL — AB
Hgb urine dipstick: NEGATIVE
Ketones, ur: NEGATIVE mg/dL
Leukocytes,Ua: NEGATIVE
Nitrite: NEGATIVE
Protein, ur: >=300 mg/dL — AB
Specific Gravity, Urine: 1.035 — ABNORMAL HIGH (ref 1.005–1.030)
pH: 5 (ref 5.0–8.0)

## 2020-05-27 LAB — COMPREHENSIVE METABOLIC PANEL WITH GFR
ALT: 19 U/L (ref 0–44)
AST: 19 U/L (ref 15–41)
Albumin: 2.8 g/dL — ABNORMAL LOW (ref 3.5–5.0)
Alkaline Phosphatase: 135 U/L — ABNORMAL HIGH (ref 38–126)
Anion gap: 8 (ref 5–15)
BUN: 35 mg/dL — ABNORMAL HIGH (ref 8–23)
CO2: 23 mmol/L (ref 22–32)
Calcium: 8.6 mg/dL — ABNORMAL LOW (ref 8.9–10.3)
Chloride: 108 mmol/L (ref 98–111)
Creatinine, Ser: 1.88 mg/dL — ABNORMAL HIGH (ref 0.61–1.24)
GFR, Estimated: 38 mL/min — ABNORMAL LOW
Glucose, Bld: 244 mg/dL — ABNORMAL HIGH (ref 70–99)
Potassium: 4.4 mmol/L (ref 3.5–5.1)
Sodium: 139 mmol/L (ref 135–145)
Total Bilirubin: 0.8 mg/dL (ref 0.3–1.2)
Total Protein: 5.8 g/dL — ABNORMAL LOW (ref 6.5–8.1)

## 2020-05-27 LAB — I-STAT CHEM 8, ED
BUN: 34 mg/dL — ABNORMAL HIGH (ref 8–23)
Calcium, Ion: 1.15 mmol/L (ref 1.15–1.40)
Chloride: 107 mmol/L (ref 98–111)
Creatinine, Ser: 1.8 mg/dL — ABNORMAL HIGH (ref 0.61–1.24)
Glucose, Bld: 240 mg/dL — ABNORMAL HIGH (ref 70–99)
HCT: 40 % (ref 39.0–52.0)
Hemoglobin: 13.6 g/dL (ref 13.0–17.0)
Potassium: 4.4 mmol/L (ref 3.5–5.1)
Sodium: 142 mmol/L (ref 135–145)
TCO2: 24 mmol/L (ref 22–32)

## 2020-05-27 LAB — CBC
HCT: 41.7 % (ref 39.0–52.0)
Hemoglobin: 14.3 g/dL (ref 13.0–17.0)
MCH: 31.7 pg (ref 26.0–34.0)
MCHC: 34.3 g/dL (ref 30.0–36.0)
MCV: 92.5 fL (ref 80.0–100.0)
Platelets: 195 10*3/uL (ref 150–400)
RBC: 4.51 MIL/uL (ref 4.22–5.81)
RDW: 12.9 % (ref 11.5–15.5)
WBC: 6.5 10*3/uL (ref 4.0–10.5)
nRBC: 0 % (ref 0.0–0.2)

## 2020-05-27 LAB — RAPID URINE DRUG SCREEN, HOSP PERFORMED
Amphetamines: NOT DETECTED
Barbiturates: NOT DETECTED
Benzodiazepines: NOT DETECTED
Cocaine: NOT DETECTED
Opiates: NOT DETECTED
Tetrahydrocannabinol: NOT DETECTED

## 2020-05-27 LAB — APTT: aPTT: 25 seconds (ref 24–36)

## 2020-05-27 LAB — ETHANOL: Alcohol, Ethyl (B): <10 mg/dL (ref <10)

## 2020-05-27 MED ORDER — CLOPIDOGREL BISULFATE 75 MG PO TABS
75.0000 mg | ORAL_TABLET | Freq: Every day | ORAL | Status: DC
  Administered 2020-05-28: 75 mg via ORAL
  Filled 2020-05-27: qty 1

## 2020-05-27 MED ORDER — CLOPIDOGREL BISULFATE 300 MG PO TABS
300.0000 mg | ORAL_TABLET | Freq: Once | ORAL | Status: AC
  Administered 2020-05-27: 300 mg via ORAL
  Filled 2020-05-27: qty 1

## 2020-05-27 MED ORDER — LORAZEPAM 2 MG/ML IJ SOLN
1.0000 mg | Freq: Once | INTRAMUSCULAR | Status: AC | PRN
  Administered 2020-05-27: 1 mg via INTRAVENOUS
  Filled 2020-05-27: qty 1

## 2020-05-27 MED ORDER — IOHEXOL 350 MG/ML SOLN
100.0000 mL | Freq: Once | INTRAVENOUS | Status: AC | PRN
  Administered 2020-05-27: 100 mL via INTRAVENOUS

## 2020-05-27 MED ORDER — STROKE: EARLY STAGES OF RECOVERY BOOK
Freq: Once | Status: DC
  Filled 2020-05-27: qty 1

## 2020-05-27 MED ORDER — ACETAMINOPHEN 160 MG/5ML PO SOLN: 650.0000 mg | ORAL | Status: DC | PRN

## 2020-05-27 MED ORDER — ACETAMINOPHEN 325 MG PO TABS
650.0000 mg | ORAL_TABLET | ORAL | Status: DC | PRN
Start: 2020-05-27 — End: 2020-05-28
  Administered 2020-05-28: 650 mg via ORAL
  Filled 2020-05-27: qty 2

## 2020-05-27 MED ORDER — ACETAMINOPHEN 650 MG RE SUPP: 650.0000 mg | RECTAL | Status: DC | PRN

## 2020-05-27 MED ORDER — CLEVIDIPINE BUTYRATE 0.5 MG/ML IV EMUL
0.0000 mg/h | INTRAVENOUS | Status: DC
  Filled 2020-05-27 (×2): qty 50

## 2020-05-27 MED ORDER — LIDOCAINE-EPINEPHRINE (PF) 1 %-1:200000 IJ SOLN: 10.0000 mL | Freq: Once | INTRAMUSCULAR | Status: DC

## 2020-05-27 MED ORDER — LABETALOL HCL 5 MG/ML IV SOLN
10.0000 mg | INTRAVENOUS | Status: DC | PRN
  Administered 2020-05-27: 10 mg via INTRAVENOUS
  Filled 2020-05-27: qty 4

## 2020-05-27 MED ORDER — LABETALOL HCL 5 MG/ML IV SOLN: 10.0000 mg | INTRAVENOUS | Status: DC | PRN

## 2020-05-27 MED ORDER — ASPIRIN 81 MG PO CHEW
81.0000 mg | CHEWABLE_TABLET | Freq: Every day | ORAL | Status: DC
  Administered 2020-05-28: 81 mg via ORAL
  Filled 2020-05-27: qty 1

## 2020-05-27 MED ORDER — ENOXAPARIN SODIUM 60 MG/0.6ML ~~LOC~~ SOLN
50.0000 mg | SUBCUTANEOUS | Status: DC
  Administered 2020-05-28: 50 mg via SUBCUTANEOUS
  Filled 2020-05-27: qty 0.5

## 2020-05-27 NOTE — H&P (Signed)
History and Physical    Mitchell Lambert CHY:[id number] DOB: [birth year] DOA: 05/27/2020  PCP: Lawerance Cruel, MD  Patient coming from: [facility]   I have personally briefly reviewed patient's old medical records in [place]  Chief Complaint: aphasia and right eye vision change  HPI: Mitchell Lambert is a 70 y.o. male with medical history significant for CAD s/p CABG, hypertension, asthma, insulin type 2 diabetes, CKD 3, hyperlipidemia and remote right eye retinal detachment who presented with acute onset aphasia to [facility] and transferred here to [facility] for neurology follow up of left MCA territory stroke.  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  Also briefly noted right eye blurriness for about 5 to 10 minutes that resolved.  He then went home to his wife and she could noticed that he had trouble word finding and decided to call EMS.  Patient's symptom rapidly improved as he was being evaluated by Tele-neuro so no TPA was given. He denies any headache. No extremity weakness.  No chest pain or palpitation.  ED Course: He was afebrile and hypertensive up to systolic of 878/676 room air.  CBC unremarkable.  Creatinine mildly elevated at 1.80 from baseline from 1.5-1.6.  Blood glucose of 240.  UDS negative.  CT head code stroke showed ill-defined hypoattenuation of the left parietal white matter CTA head and neck showed no LVO but has severe stenosis of the proximal right P2 segment.   Review of Systems:  Constitutional: No Weight Change, No Fever ENT/Mouth: No sore throat, No Rhinorrhea Eyes: No Eye Pain, + Vision Changes Cardiovascular: No Chest Pain, no SOB,No Edema, No Palpitations Respiratory: No Cough, No Sputum Gastrointestinal: No Nausea, No Vomiting, No Diarrhea, No Constipation, No Pain Genitourinary: no Urinary Incontinence Musculoskeletal: No  Arthralgias, No Myalgias Skin: No Skin Lesions, No Pruritus, Neuro: no Weakness, No Numbness,  No Loss of Consciousness, No Syncope Psych: No Anxiety/Panic, No Depression, no decrease appetite Heme/Lymph: No Bruising, No Bleeding  Past Medical History:  Diagnosis Date  . Angina    NO ANGINA SINCE CABG  . Arthritis    right ankle  . Chronic daily headache    "@ least every other day""improved since heart surgery"  . Coronary artery disease    PT STATES HEART DOING WELL SINCE CABG SURGERY - DR. TILLEY IS HIS CARDIOLOGIST  . Diabetes mellitus    Lantus x 10 yrs  . Gout   . History of kidney stones 09-08-12   multiple times with some lithotripsies  . Hyperlipemia   . Hypertension     Past Surgical History:  Procedure Laterality Date  . BACK SURGERY     microsurgery: spinal stenosis -lumbar  . bone spur  1990's   right great toe; "probably related to gout"  . CARDIAC CATHETERIZATION  04/27/11  . CATARACT EXTRACTION W/ INTRAOCULAR LENS IMPLANT  1990's   right; "lens was replaced twice over 3 months; lens is actually over iris"  . CORONARY ARTERY BYPASS GRAFT  04/30/2011   Procedure: CORONARY ARTERY BYPASS GRAFTING (CABG);  Surgeon: Melrose Nakayama, MD;  Location: [place];  Service: Open Heart Surgery;  Laterality: N/A;,x4 vessels  . CYSTOSCOPY W/ URETERAL STENT PLACEMENT Right 08/19/2012   Procedure: CYSTOSCOPY WITH RETROGRADE PYELOGRAM/URETERAL STENT PLACEMENT;  Surgeon: Molli Hazard, MD;  Location: [facility];  Service: Urology;  Laterality: Right;  .  CYSTOSCOPY WITH RETROGRADE PYELOGRAM, URETEROSCOPY AND STENT PLACEMENT Left 07/17/2013   Procedure: CYSTOSCOPY, LEFT URETEROSCOPY, BASKET EXTRACTION OF STONE, INSERTION OF LEFT URETERAL STENT, ;  Surgeon: Jorja Loa, MD;  Location: [facility];  Service: Urology;  Laterality: Left;  . CYSTOSCOPY/RETROGRADE/URETEROSCOPY Right 09/12/2012   Procedure: CYSTOSCOPY/RETROGRADE/URETEROSCOPY;  Surgeon: Franchot Gallo, MD;  Location:  [facility];  Service: Urology;  Laterality: Right;  . HOLMIUM LASER APPLICATION Right 0/97/3532   Procedure: HOLMIUM LASER APPLICATION;  Surgeon: Franchot Gallo, MD;  Location: [facility];  Service: Urology;  Laterality: Right;  . HOLMIUM LASER APPLICATION Left 9/92/4268   Procedure: HOLMIUM LASER APPLICATION;  Surgeon: Jorja Loa, MD;  Location: [facility];  Service: Urology;  Laterality: Left;  . KIDNEY STONE SURGERY     "probably 3 times"  . LEFT HEART CATHETERIZATION WITH CORONARY ANGIOGRAM N/A 04/28/2011   Procedure: LEFT HEART CATHETERIZATION WITH CORONARY ANGIOGRAM;  Surgeon: Jacolyn Reedy, MD;  Location: [facility];  Service: Cardiovascular;  Laterality: N/A;  . LITHOTRIPSY     "many; probably 5 times"  . RADIAL ARTERY HARVEST  04/30/2011   Procedure: RADIAL ARTERY HARVEST;  Surgeon: Melrose Nakayama, MD;  Location: [place];  Service: Open Heart Surgery;  Laterality: Left;  . RETINAL DETACHMENT SURGERY  1990's   right eye; "made me have an early cataract"  . VASECTOMY       reports that he has never smoked. He has never used smokeless tobacco. He reports current alcohol use. He reports current drug use. Drug: Marijuana. Social History  Allergies  Allergen Reactions  . Gabapentin Other (See Comments)    "made me so dizzy I missed work for 2 days"  . Metformin And Related Shortness Of Breath  . Sulfa Antibiotics Shortness Of Breath and Rash  . Amoxicillin     rash Did it involve swelling of the face/tongue/throat, SOB, or low BP? N Did it involve sudden or severe rash/hives, skin peeling, or any reaction on the inside of your mouth or nose?  Y Did you need to seek medical attention at a hospital or doctor's office? N When did it last happen?"years and years ago" If all above answers are "NO", may proceed with cephalosporin use.   Marland Kitchen Lisinopril     Cough   . Doxycycline Rash    Family History  Problem Relation Age of Onset  . Cataracts Father   . Cataracts  Mother   . Diabetes Paternal Aunt   . Diabetes Paternal Grandmother   . Amblyopia Neg Hx   . Blindness Neg Hx   . Glaucoma Neg Hx   . Macular degeneration Neg Hx   . Retinal detachment Neg Hx   . Strabismus Neg Hx   . Retinitis pigmentosa Neg Hx      Prior to Admission medications   Medication Sig Start Date End Date Taking? Authorizing Provider  bisoprolol (ZEBETA) 5 MG tablet Take 5 mg by mouth daily.   Yes [provider]  Insulin Glargine (BASAGLAR KWIKPEN) 100 UNIT/ML SOPN Inject 100 Units into the skin in the morning. In AM   Yes [provider]  ACCU-CHEK GUIDE test strip USE TO TEST BLOOD SUGAR 2 TO 3 TIMES A DAY 03/18/17   [provider]  Alirocumab (PRALUENT) 150 MG/ML SOAJ Inject 150 mg into the skin every 14 (fourteen) days. 04/20/19   Lorretta Harp, MD  allopurinol (ZYLOPRIM) 300 MG tablet Take 300 mg by mouth daily.    [provider]  aspirin EC 81 MG tablet Take 81 mg by mouth every morning.     [provider]  atorvastatin (LIPITOR) 40 MG tablet Take 40 mg by mouth daily.    [provider]  B-D ULTRAFINE III SHORT PEN 31G X 8 MM MISC USE UTD WITH FLEXPEN. 03/18/17   [provider]  Biotin 5000 MCG TABS Take 5,000 mcg by mouth daily.     [provider]  bisoprolol (ZEBETA) 10 MG tablet Take 10 mg by mouth daily. 02/28/19   [provider]  Cholecalciferol (VITAMIN D3) 5000 UNITS TABS Take 5,000 Units by mouth daily.     [provider]  fish oil-omega-3 fatty acids 1000 MG capsule Take 1 g by mouth 2 (two) times daily.    [provider]  glucosamine-chondroitin 500-400 MG tablet Take 2 tablets by mouth daily.    [provider]  insulin aspart (NOVOLOG FLEXPEN) 100 UNIT/ML FlexPen Inject into the skin as needed for high blood sugar.    [provider]  Ipratropium-Albuterol (COMBIVENT) 20-100 MCG/ACT AERS respimat Inhale 1 puff into the lungs every 6  (six) hours as needed for wheezing. 01/15/19 03/16/19  Amin, Jeanella Flattery, MD  irbesartan (AVAPRO) 300 MG tablet Take 300 mg by mouth daily. 12/06/18   [provider]  L-Lysine HCl 1000 MG TABS Take 1 tablet by mouth daily.    [provider]  LORazepam (ATIVAN) 0.5 MG tablet Take 0.5 mg by mouth daily as needed for anxiety. 10/27/18   [provider]  magnesium oxide (MAG-OX) 400 MG tablet Take 400 mg by mouth 2 (two) times daily.     [provider]  nitroGLYCERIN (NITROSTAT) 0.4 MG SL tablet Place 0.4 mg under the tongue as needed for chest pain. 12/13/18   [provider]  olmesartan (BENICAR) 10 mg TABS tablet Take 10 mg by mouth daily.    [provider]  omeprazole (PRILOSEC) 20 MG capsule Take 20 mg by mouth daily as needed. 10/26/18   [provider]  tamsulosin (FLOMAX) 0.4 MG CAPS capsule Take 0.4 mg by mouth daily as needed ("for kidney stone issues"). 11/01/18   [provider]  traMADol (ULTRAM) 50 MG tablet Take 50 mg by mouth every 6 (six) hours as needed for moderate pain.    [provider]  VENTOLIN HFA 108 (90 Base) MCG/ACT inhaler Inhale 2 puffs into the lungs every 6 (six) hours as needed for wheezing or shortness of breath.    [provider]  vitamin C (ASCORBIC ACID) 500 MG tablet Take 500 mg by mouth daily.     [provider]  VITAMIN E PO Take 1 capsule by mouth daily.    [provider]    Physical Exam: Vitals:   05/27/20 2130 05/27/20 2149 05/27/20 2200 05/27/20 2230  BP: (!) 204/91 (!) 189/89 (!) 184/87 (!) 168/87  Pulse: 60 64 64 62  Resp: 12 14 14 12   Temp:      TempSrc:      SpO2: 100% 98% 99% 98%  Weight:      Height:        Constitutional: NAD, calm, comfortable, obese male laying flat in bed Vitals:   05/27/20 2130 05/27/20 2149 05/27/20 2200 05/27/20 2230  BP: (!) 204/91 (!) 189/89 (!) 184/87 (!) 168/87  Pulse: 60 64 64 62  Resp: 12 14 14 12    Temp:      TempSrc:      SpO2:  100% 98% 99% 98%  Weight:      Height:       Eyes: PERRL, lids and conjunctivae normal ENMT: Mucous membranes are moist.  Neck: normal, supple Respiratory: clear to auscultation bilaterally, no wheezing, no crackles. Normal respiratory effort. No accessory muscle use.  Cardiovascular: Regular rate and rhythm, no murmurs / rubs / gallops. No extremity edema.  Abdomen: no tenderness, no masses palpated.  Bowel sounds positive.  Musculoskeletal: no clubbing / cyanosis. No joint deformity upper and lower extremities. Good ROM, no contractures. Normal muscle tone.  Skin: no rashes, lesions, ulcers. No induration Neurologic: CN 2-12 grossly intact. Sensation intact. Strength 5/5 in all 4.  Tach finger-nose.  Intact heel-to-shin.  No dysarthria or aphasia.  Able to speak in full clear sentences Psychiatric: Normal judgment and insight. Alert and oriented x 3. Normal mood.     Labs on Admission: I have personally reviewed following labs and imaging studies  CBC: Recent Labs  Lab 05/27/20 1905 05/27/20 1915  WBC 6.5  --   NEUTROABS 4.2  --   HGB 14.3 13.6  HCT 41.7 40.0  MCV 92.5  --   PLT 195  --    Basic Metabolic Panel: Recent Labs  Lab 05/27/20 1905 05/27/20 1915  NA 139 142  K 4.4 4.4  CL 108 107  CO2 23  --   GLUCOSE 244* 240*  BUN 35* 34*  CREATININE 1.88* 1.80*  CALCIUM 8.6*  --    GFR: Estimated Creatinine Clearance: 47.9 mL/min (A) (by C-G formula based on SCr of 1.8 mg/dL (H)). Liver Function Tests: Recent Labs  Lab 05/27/20 1905  AST 19  ALT 19  ALKPHOS 135*  BILITOT 0.8  PROT 5.8*  ALBUMIN 2.8*   No results for input(s): LIPASE, AMYLASE in the last 168 hours. No results for input(s): AMMONIA in the last 168 hours. Coagulation Profile: Recent Labs  Lab 05/27/20 1905  INR 1.0   Cardiac Enzymes: No results for input(s): CKTOTAL, CKMB, CKMBINDEX, TROPONINI in the last 168 hours. BNP (last 3 results) No results for  input(s): PROBNP in the last 8760 hours. HbA1C: No results for input(s): HGBA1C in the last 72 hours. CBG: No results for input(s): GLUCAP in the last 168 hours. Lipid Profile: No results for input(s): CHOL, HDL, LDLCALC, TRIG, CHOLHDL, LDLDIRECT in the last 72 hours. Thyroid Function Tests: No results for input(s): TSH, T4TOTAL, FREET4, T3FREE, THYROIDAB in the last 72 hours. Anemia Panel: No results for input(s): VITAMINB12, FOLATE, FERRITIN, TIBC, IRON, RETICCTPCT in the last 72 hours. Urine analysis:    Component Value Date/Time   COLORURINE STRAW (A) 05/27/2020 2133   APPEARANCEUR CLEAR 05/27/2020 2133   LABSPEC 1.035 (H) 05/27/2020 2133   PHURINE 5.0 05/27/2020 2133   GLUCOSEU 150 (A) 05/27/2020 2133   HGBUR NEGATIVE 05/27/2020 2133   BILIRUBINUR NEGATIVE 05/27/2020 2133   BILIRUBINUR negative 03/31/2015 1723   BILIRUBINUR neg 08/16/2012 0912   KETONESUR NEGATIVE 05/27/2020 2133   PROTEINUR >=300 (A) 05/27/2020 2133   UROBILINOGEN 0.2 03/31/2015 1723   UROBILINOGEN 0.2 04/29/2011 0004   NITRITE NEGATIVE 05/27/2020 2133   LEUKOCYTESUR NEGATIVE 05/27/2020 2133    Radiological Exams on Admission: CT ANGIO HEAD W OR WO CONTRAST  Result Date: 05/27/2020 CLINICAL DATA:  Expressive aphasia and confusion EXAM: CT ANGIOGRAPHY HEAD AND NECK CT PERFUSION BRAIN TECHNIQUE: Multidetector CT imaging of the head and neck was performed using the standard protocol during bolus administration of intravenous contrast. Multiplanar CT image reconstructions and  MIPs were obtained to evaluate the vascular anatomy. Carotid stenosis measurements (when applicable) are obtained utilizing NASCET criteria, using the distal internal carotid diameter as the denominator. Multiphase CT imaging of the brain was performed following IV bolus contrast injection. Subsequent parametric perfusion maps were calculated using RAPID software. CONTRAST:  159mL OMNIPAQUE IOHEXOL 350 MG/ML SOLN COMPARISON:  None. FINDINGS:  CTA NECK FINDINGS SKELETON: There is no bony spinal canal stenosis. No lytic or blastic lesion. OTHER NECK: Normal pharynx, larynx and major salivary glands. No cervical lymphadenopathy. Unremarkable thyroid gland. UPPER CHEST: No pneumothorax or pleural effusion. No nodules or masses. AORTIC ARCH: There is calcific atherosclerosis of the aortic arch. There is no aneurysm, dissection or hemodynamically significant stenosis of the visualized portion of the aorta. Conventional 3 vessel aortic branching pattern. The visualized proximal subclavian arteries are widely patent. RIGHT CAROTID SYSTEM: Normal without aneurysm, dissection or stenosis. LEFT CAROTID SYSTEM: Normal without aneurysm, dissection or stenosis. VERTEBRAL ARTERIES: Right dominant configuration. Poor visualization of left origin. There is no dissection, occlusion or flow-limiting stenosis to the skull base (V1-V3 segments). CTA HEAD FINDINGS POSTERIOR CIRCULATION: --Vertebral arteries: Moderate atherosclerotic calcification of the right V4 segment. Left V4 terminates in PICA. --Inferior cerebellar arteries: Normal. --Basilar artery: Normal. --Superior cerebellar arteries: Normal. --Posterior cerebral arteries (PCA): There is severe stenosis/short segment occlusion of the proximal right P2 segment. The distal right PCA is patent. ANTERIOR CIRCULATION: --Intracranial internal carotid arteries: Normal. --Anterior cerebral arteries (ACA): Normal. Both A1 segments are present. Patent anterior communicating artery (a-comm). --Middle cerebral arteries (MCA): Normal. VENOUS SINUSES: As permitted by contrast timing, patent. ANATOMIC VARIANTS: None Review of the MIP images confirms the above findings. CT Brain Perfusion Findings: Technically inadequate CT perfusion study. IMPRESSION: 1. No emergent large vessel occlusion. 2. Severe stenosis/short segment occlusion of the proximal right P2 segment. 3. Technically inadequate CT perfusion study. Aortic  Atherosclerosis (ICD10-I70.0). Electronically Signed   By: Ulyses Jarred M.D.   On: 05/27/2020 20:42   CT ANGIO NECK W OR WO CONTRAST  Result Date: 05/27/2020 CLINICAL DATA:  Expressive aphasia and confusion EXAM: CT ANGIOGRAPHY HEAD AND NECK CT PERFUSION BRAIN TECHNIQUE: Multidetector CT imaging of the head and neck was performed using the standard protocol during bolus administration of intravenous contrast. Multiplanar CT image reconstructions and MIPs were obtained to evaluate the vascular anatomy. Carotid stenosis measurements (when applicable) are obtained utilizing NASCET criteria, using the distal internal carotid diameter as the denominator. Multiphase CT imaging of the brain was performed following IV bolus contrast injection. Subsequent parametric perfusion maps were calculated using RAPID software. CONTRAST:  137mL OMNIPAQUE IOHEXOL 350 MG/ML SOLN COMPARISON:  None. FINDINGS: CTA NECK FINDINGS SKELETON: There is no bony spinal canal stenosis. No lytic or blastic lesion. OTHER NECK: Normal pharynx, larynx and major salivary glands. No cervical lymphadenopathy. Unremarkable thyroid gland. UPPER CHEST: No pneumothorax or pleural effusion. No nodules or masses. AORTIC ARCH: There is calcific atherosclerosis of the aortic arch. There is no aneurysm, dissection or hemodynamically significant stenosis of the visualized portion of the aorta. Conventional 3 vessel aortic branching pattern. The visualized proximal subclavian arteries are widely patent. RIGHT CAROTID SYSTEM: Normal without aneurysm, dissection or stenosis. LEFT CAROTID SYSTEM: Normal without aneurysm, dissection or stenosis. VERTEBRAL ARTERIES: Right dominant configuration. Poor visualization of left origin. There is no dissection, occlusion or flow-limiting stenosis to the skull base (V1-V3 segments). CTA HEAD FINDINGS POSTERIOR CIRCULATION: --Vertebral arteries: Moderate atherosclerotic calcification of the right V4 segment. Left V4  terminates in PICA. --Inferior cerebellar  arteries: Normal. --Basilar artery: Normal. --Superior cerebellar arteries: Normal. --Posterior cerebral arteries (PCA): There is severe stenosis/short segment occlusion of the proximal right P2 segment. The distal right PCA is patent. ANTERIOR CIRCULATION: --Intracranial internal carotid arteries: Normal. --Anterior cerebral arteries (ACA): Normal. Both A1 segments are present. Patent anterior communicating artery (a-comm). --Middle cerebral arteries (MCA): Normal. VENOUS SINUSES: As permitted by contrast timing, patent. ANATOMIC VARIANTS: None Review of the MIP images confirms the above findings. CT Brain Perfusion Findings: Technically inadequate CT perfusion study. IMPRESSION: 1. No emergent large vessel occlusion. 2. Severe stenosis/short segment occlusion of the proximal right P2 segment. 3. Technically inadequate CT perfusion study. Aortic Atherosclerosis (ICD10-I70.0). Electronically Signed   By: Ulyses Jarred M.D.   On: 05/27/2020 20:42   CT CEREBRAL PERFUSION W CONTRAST  Result Date: 05/27/2020 CLINICAL DATA:  Expressive aphasia and confusion EXAM: CT ANGIOGRAPHY HEAD AND NECK CT PERFUSION BRAIN TECHNIQUE: Multidetector CT imaging of the head and neck was performed using the standard protocol during bolus administration of intravenous contrast. Multiplanar CT image reconstructions and MIPs were obtained to evaluate the vascular anatomy. Carotid stenosis measurements (when applicable) are obtained utilizing NASCET criteria, using the distal internal carotid diameter as the denominator. Multiphase CT imaging of the brain was performed following IV bolus contrast injection. Subsequent parametric perfusion maps were calculated using RAPID software. CONTRAST:  18mL OMNIPAQUE IOHEXOL 350 MG/ML SOLN COMPARISON:  None. FINDINGS: CTA NECK FINDINGS SKELETON: There is no bony spinal canal stenosis. No lytic or blastic lesion. OTHER NECK: Normal pharynx, larynx and major  salivary glands. No cervical lymphadenopathy. Unremarkable thyroid gland. UPPER CHEST: No pneumothorax or pleural effusion. No nodules or masses. AORTIC ARCH: There is calcific atherosclerosis of the aortic arch. There is no aneurysm, dissection or hemodynamically significant stenosis of the visualized portion of the aorta. Conventional 3 vessel aortic branching pattern. The visualized proximal subclavian arteries are widely patent. RIGHT CAROTID SYSTEM: Normal without aneurysm, dissection or stenosis. LEFT CAROTID SYSTEM: Normal without aneurysm, dissection or stenosis. VERTEBRAL ARTERIES: Right dominant configuration. Poor visualization of left origin. There is no dissection, occlusion or flow-limiting stenosis to the skull base (V1-V3 segments). CTA HEAD FINDINGS POSTERIOR CIRCULATION: --Vertebral arteries: Moderate atherosclerotic calcification of the right V4 segment. Left V4 terminates in PICA. --Inferior cerebellar arteries: Normal. --Basilar artery: Normal. --Superior cerebellar arteries: Normal. --Posterior cerebral arteries (PCA): There is severe stenosis/short segment occlusion of the proximal right P2 segment. The distal right PCA is patent. ANTERIOR CIRCULATION: --Intracranial internal carotid arteries: Normal. --Anterior cerebral arteries (ACA): Normal. Both A1 segments are present. Patent anterior communicating artery (a-comm). --Middle cerebral arteries (MCA): Normal. VENOUS SINUSES: As permitted by contrast timing, patent. ANATOMIC VARIANTS: None Review of the MIP images confirms the above findings. CT Brain Perfusion Findings: Technically inadequate CT perfusion study. IMPRESSION: 1. No emergent large vessel occlusion. 2. Severe stenosis/short segment occlusion of the proximal right P2 segment. 3. Technically inadequate CT perfusion study. Aortic Atherosclerosis (ICD10-I70.0). Electronically Signed   By: Ulyses Jarred M.D.   On: 05/27/2020 20:42   CT HEAD CODE STROKE WO CONTRAST  Result Date:  05/27/2020 CLINICAL DATA:  Code stroke.  Aphasia. EXAM: CT HEAD WITHOUT CONTRAST TECHNIQUE: Contiguous axial images were obtained from the base of the skull through the vertex without intravenous contrast. COMPARISON:  None. FINDINGS: Brain: No evidence of acute large vascular territory infarct. No acute hemorrhage. No mass lesion or abnormal mass effect. No hydrocephalus. Cavum septum pellucidum et vergae, anatomic variant. Vascular: Calcific atherosclerosis.  No hyperdense vessel. Skull:  No acute fracture. Small bony hyperostosis along the right frontal calvarium. Sinuses/Orbits: Small right maxillary sinus with mucosal thickening, possibly the sequela of chronic sinusitis. Otherwise, sinuses are largely clear. Unremarkable orbits. Other: No mastoid effusions. IMPRESSION: Ill-defined hypoattenuation involving the left parietal white matter with slight loss of gray-white differentiation, concerning for age indeterminate infarct. Given the patient's symptoms and absence of priors for comparison, recommend MRI to further evaluate. Code stroke imaging results were communicated on 05/27/2020 at 7:01 pm to provider Dr. Vanita Panda Via telephone, who verbally acknowledged these results. Electronically Signed   By: Margaretha Sheffield MD   On: 05/27/2020 19:07      Assessment/Plan  Left MCA territory stroke - CTA head and neck showed no LVO - obtain MRI brain - obtain echocardiogram  - continue daily aspirin and start Plavix per neuro - Obtain A1c and lipids - PT/OT/SLT - Frequent neuro checks and keep on telemetry- 30 event monitor if no arrhythmias captured - Allow for permissive hypertension with blood pressure treatment as needed only if systolic goes above 761/607  AKI on CKD stage 3b Give 500cc bolus Follow with repeat labs in the morning Avoid nephrotoxic agents  Insulin-dependent type 2 diabetes On 100U daily of Lantus at home. Start 50 U tomorrow morning with resistant SSI. Give 20 U of Novolog  now for hyperglycemia Check HbA1C   Hypertension Hold home antihypertensives to allow for permissive hypertension  CAD s/p CABG Continue aspirin  Hyperlipidemia Continue atorvastatin  Morbid obesity BMI of greater than 33  DVT prophylaxis:.Lovenox Code Status: Full Family Communication: Plan discussed with patient at bedside  disposition Plan: Home with observation Consults called: Neurology Admission status: Observation   Level of care: Telemetry Medical  Status is: Observation  The patient remains OBS appropriate and will d/c before 2 midnights.  Dispo: The patient is from: Home              Anticipated d/c is to: Home              Patient currently is not medically stable to d/c.   Difficult to place patient No         Orene Desanctis DO [facility]   If 7PM-7AM, please contact night-coverage [email or web address]   05/27/2020, 10:42 PM

## 2020-05-27 NOTE — ED Triage Notes (Signed)
Pt brought in by wife after coming home from work around [place] w expressive aphasia and mild confusion. No hx of stroke, denies anticoags

## 2020-05-27 NOTE — ED Notes (Signed)
Transported to MRI

## 2020-05-27 NOTE — ED Notes (Signed)
Pt arrived with [facility] to [facility] as a Code Stroke. LKW 1615 while at work, pt reports R sided visual changes and aphasia. NIH 0 with [facility]. On arrival, pt ambulatory to restroom with steady gait.

## 2020-05-27 NOTE — ED Provider Notes (Signed)
Patient is a patient who was activated as a code stroke and transferred for further management.  I just assessed the patient and his symptoms are nearly completely resolved.  He denies any visual changes right now and he reports speech is almost back to baseline.  I examined him otherwise did not find any other focal neurologic deficits.  Lungs clear and chest nontender.  Abdomen nontender.  Patient resting comfortably.  I spoke with Dr. Leonel Ramsay with neurology who reviewed the case and images.  The CTs finally resulted and neurology team feels he needs admission for further secondary stroke work-up.  Medicine team called for admission.   Valary Manahan, Gwenyth Allegra, MD 05/27/20 2352

## 2020-05-27 NOTE — ED Provider Notes (Signed)
[place] DEPT Provider Note   CSN: [id number] Arrival date & time: 05/27/20  1825     History No chief complaint on file.   Mitchell Lambert is a 70 y.o. male.  HPI Patient seen emergently as a transfer from the waiting room to the resuscitation bay due to concern of aphasia.  History is obtained by him and his wife who accompanied him. During this transfer from waiting room to resuscitation, code stroke was activated. Patient was in his usual state of health until about 2.5 hours prior to ED arrival.  At that point he noticed aphasia, described as word finding difficulty, though with preservation of ability to speak. He denies a focal weakness.  He did have some right sided visual deficits, though these have resolved. He has no pain, no extremity sensory loss. He has multiple medical issues including diabetes, hypertension, but again was in his usual state of health prior to the onset of stroke-like symptoms.  Past Medical History:  Diagnosis Date  . Angina    NO ANGINA SINCE CABG  . Arthritis    right ankle  . Chronic daily headache    "@ least every other day""improved since heart surgery"  . Coronary artery disease    PT STATES HEART DOING WELL SINCE CABG SURGERY - DR. TILLEY IS HIS CARDIOLOGIST  . Diabetes mellitus    Lantus x 10 yrs  . Gout   . History of kidney stones 09-08-12   multiple times with some lithotripsies  . Hyperlipemia   . Hypertension     Patient Active Problem List   Diagnosis Date Noted  . Elevated troponin 01/13/2019  . COVID-19 virus infection 01/13/2019  . S/P CABG (coronary artery bypass graft)   . CAD (coronary artery disease) 04/28/2011  . Insulin dependent type 2 diabetes mellitus, controlled ([place])   . Hyperlipidemia   . Hypertension   . Obesity (BMI 30-39.9)   . Nephrolithiasis, uric acid     Past Surgical History:  Procedure Laterality Date  . BACK SURGERY     microsurgery: spinal stenosis -lumbar   . bone spur  1990's   right great toe; "probably related to gout"  . CARDIAC CATHETERIZATION  04/27/11  . CATARACT EXTRACTION W/ INTRAOCULAR LENS IMPLANT  1990's   right; "lens was replaced twice over 3 months; lens is actually over iris"  . CORONARY ARTERY BYPASS GRAFT  04/30/2011   Procedure: CORONARY ARTERY BYPASS GRAFTING (CABG);  Surgeon: Melrose Nakayama, MD;  Location: [place];  Service: Open Heart Surgery;  Laterality: N/A;,x4 vessels  . CYSTOSCOPY W/ URETERAL STENT PLACEMENT Right 08/19/2012   Procedure: CYSTOSCOPY WITH RETROGRADE PYELOGRAM/URETERAL STENT PLACEMENT;  Surgeon: Molli Hazard, MD;  [facility];  Service: Urology;  Laterality: Right;  . CYSTOSCOPY WITH RETROGRADE PYELOGRAM, URETEROSCOPY AND STENT PLACEMENT Left 07/17/2013   Procedure: CYSTOSCOPY, LEFT URETEROSCOPY, BASKET EXTRACTION OF STONE, INSERTION OF LEFT URETERAL STENT, ;  Surgeon: Jorja Loa, MD;  [facility];  Service: Urology;  Laterality: Left;  . CYSTOSCOPY/RETROGRADE/URETEROSCOPY Right 09/12/2012   Procedure: CYSTOSCOPY/RETROGRADE/URETEROSCOPY;  Surgeon: Franchot Gallo, MD;  [facility];  Service: Urology;  Laterality: Right;  . HOLMIUM LASER APPLICATION Right 5/80/9983   Procedure: HOLMIUM LASER APPLICATION;  Surgeon: Franchot Gallo, MD;  [facility];  Service: Urology;  Laterality: Right;  . HOLMIUM LASER APPLICATION Left 3/82/5053   Procedure: HOLMIUM LASER APPLICATION;  Surgeon: Jorja Loa, MD;  [facility];  Service: Urology;  Laterality:  Left;  . KIDNEY STONE SURGERY     "probably 3 times"  . LEFT HEART CATHETERIZATION WITH CORONARY ANGIOGRAM N/A 04/28/2011   Procedure: LEFT HEART CATHETERIZATION WITH CORONARY ANGIOGRAM;  Surgeon: Jacolyn Reedy, MD;  Location: [facility] C[facility] LAB;  Service: Cardiovascular;  Laterality: N/A;  . LITHOTRIPSY     "many; probably 5 times"  . RADIAL ARTERY HARVEST  04/30/2011   Procedure: RADIAL ARTERY HARVEST;  Surgeon: Melrose Nakayama, MD;  Location: [place];  Service: Open Heart Surgery;  Laterality: Left;  . RETINAL DETACHMENT SURGERY  1990's   right eye; "made me have an early cataract"  . VASECTOMY         Family History  Problem Relation Age of Onset  . Cataracts Father   . Cataracts Mother   . Diabetes Paternal Aunt   . Diabetes Paternal Grandmother   . Amblyopia Neg Hx   . Blindness Neg Hx   . Glaucoma Neg Hx   . Macular degeneration Neg Hx   . Retinal detachment Neg Hx   . Strabismus Neg Hx   . Retinitis pigmentosa Neg Hx     Social History   Tobacco Use  . Smoking status: Never Smoker  . Smokeless tobacco: Never Used  Vaping Use  . Vaping Use: Never used  Substance Use Topics  . Alcohol use: Yes    Comment:  OCCAS BEER  OR MIXED DRINK LAST MARIJUANA COUPLE MONTHS AGO  . Drug use: Yes    Types: Marijuana    Comment: last marijuana use ~ 2010; "very rare". since    Home Medications Prior to Admission medications   Medication Sig Start Date End Date Taking? Authorizing Provider  ACCU-CHEK GUIDE test strip USE TO TEST BLOOD SUGAR 2 TO 3 TIMES A DAY 03/18/17   [provider]  Alirocumab (PRALUENT) 150 MG/ML SOAJ Inject 150 mg into the skin every 14 (fourteen) days. 04/20/19   Lorretta Harp, MD  allopurinol (ZYLOPRIM) 300 MG tablet Take 300 mg by mouth daily.    [provider]  aspirin EC 81 MG tablet Take 81 mg by mouth every morning.     [provider]  atorvastatin (LIPITOR) 40 MG tablet Take 40 mg by mouth daily.    [provider]  B-D ULTRAFINE III SHORT PEN 31G X 8 MM MISC USE UTD WITH FLEXPEN. 03/18/17   [provider]  Biotin 5000 MCG TABS Take 5,000 mcg by mouth daily.     [provider]  bisoprolol (ZEBETA) 10 MG tablet Take 10 mg by mouth daily. 02/28/19   [provider]  Cholecalciferol (VITAMIN D3) 5000 UNITS TABS Take 5,000 Units by mouth daily.     [provider]  fish oil-omega-3 fatty  acids 1000 MG capsule Take 1 g by mouth 2 (two) times daily.    [provider]  glucosamine-chondroitin 500-400 MG tablet Take 2 tablets by mouth daily.    [provider]  insulin aspart (NOVOLOG FLEXPEN) 100 UNIT/ML FlexPen Inject into the skin as needed for high blood sugar.    [provider]  Insulin Glargine (BASAGLAR KWIKPEN) 100 UNIT/ML SOPN Inject 100 Units into the skin daily. In AM    [provider]  Ipratropium-Albuterol (COMBIVENT) 20-100 MCG/ACT AERS respimat Inhale 1 puff into the lungs every 6 (six) hours as needed for wheezing. 01/15/19 03/16/19  Amin, Jeanella Flattery, MD  irbesartan (AVAPRO) 300 MG tablet Take 300 mg by mouth daily. 12/06/18  [provider]  L-Lysine HCl 1000 MG TABS Take 1 tablet by mouth daily.    [provider]  LORazepam (ATIVAN) 0.5 MG tablet Take 0.5 mg by mouth daily as needed for anxiety. 10/27/18   [provider]  magnesium oxide (MAG-OX) 400 MG tablet Take 400 mg by mouth 2 (two) times daily.     [provider]  nitroGLYCERIN (NITROSTAT) 0.4 MG SL tablet Place 0.4 mg under the tongue as directed. 12/13/18   [provider]  omeprazole (PRILOSEC) 20 MG capsule Take 20 mg by mouth daily as needed. 10/26/18   [provider]  tamsulosin (FLOMAX) 0.4 MG CAPS capsule Take 0.4 mg by mouth daily as needed. Kidney stone issues. 11/01/18   [provider]  vitamin C (ASCORBIC ACID) 500 MG tablet Take 500 mg by mouth daily.     [provider]  VITAMIN E PO Take 1 capsule by mouth daily.    [provider]    Allergies    Gabapentin, Metformin and related, Sulfa antibiotics, Amoxicillin, Lisinopril, and Doxycycline  Review of Systems   Review of Systems  Constitutional:       Per HPI, otherwise negative  HENT:       Per HPI, otherwise negative  Eyes: Positive for visual disturbance.  Respiratory:       Per HPI, otherwise negative   Cardiovascular:       Per HPI, otherwise negative  Gastrointestinal: Negative for vomiting.  Endocrine:       Negative aside from HPI  Genitourinary:       Neg aside from HPI   Musculoskeletal:       Per HPI, otherwise negative  Skin: Negative.   Neurological: Positive for speech difficulty. Negative for syncope.    Physical Exam Updated Vital Signs There were no vitals taken for this visit.  Physical Exam Vitals and nursing note reviewed.  Constitutional:      General: He is not in acute distress.    Appearance: He is well-developed.  HENT:     Head: Normocephalic and atraumatic.  Eyes:     Conjunctiva/sclera: Conjunctivae normal.  Cardiovascular:     Rate and Rhythm: Normal rate and regular rhythm.  Pulmonary:     Effort: Pulmonary effort is normal. No respiratory distress.     Breath sounds: No stridor.  Abdominal:     General: There is no distension.  Skin:    General: Skin is warm and dry.  Neurological:     Mental Status: He is alert and oriented to person, place, and time.     Cranial Nerves: Dysarthria present.     Motor: No weakness, tremor, atrophy or abnormal muscle tone.     Coordination: Coordination normal.     Comments: No facial asymmetry, extraocular motion intact,      ED Results / Procedures / Treatments   Labs (all labs ordered are listed, but only abnormal results are displayed) Labs Reviewed  COMPREHENSIVE METABOLIC PANEL - Abnormal; Notable for the following components:      Result Value   Glucose, Bld 244 (*)    BUN 35 (*)    Creatinine, Ser 1.88 (*)    Calcium 8.6 (*)    Total Protein 5.8 (*)    Albumin 2.8 (*)    Alkaline Phosphatase 135 (*)    GFR, Estimated 38 (*)    All other components within normal limits  I-STAT CHEM 8, ED - Abnormal; Notable for the following  components:   BUN 34 (*)    Creatinine, Ser 1.80 (*)    Glucose, Bld 240 (*)    All other components within normal limits  RESP PANEL BY RT-PCR (FLU A&B, COVID)  ARPGX2  ETHANOL  PROTIME-INR  APTT  CBC  DIFFERENTIAL  RAPID URINE DRUG SCREEN, HOSP PERFORMED  URINALYSIS, ROUTINE W REFLEX MICROSCOPIC    EKG EKG Interpretation  Date/Time:  Monday May 27 2020 18:35:40 EDT Ventricular Rate:  76 PR Interval:    QRS Duration: 104 QT Interval:  395 QTC Calculation: 445 R Axis:   35 Text Interpretation: Sinus rhythm Borderline repolarization abnormality unremarkable ECG Confirmed by Carmin Muskrat [id number]) on 05/27/2020 6:45:02 PM   Radiology CT HEAD CODE STROKE WO CONTRAST  Result Date: 05/27/2020 CLINICAL DATA:  Code stroke.  Aphasia. EXAM: CT HEAD WITHOUT CONTRAST TECHNIQUE: Contiguous axial images were obtained from the base of the skull through the vertex without intravenous contrast. COMPARISON:  None. FINDINGS: Brain: No evidence of acute large vascular territory infarct. No acute hemorrhage. No mass lesion or abnormal mass effect. No hydrocephalus. Cavum septum pellucidum et vergae, anatomic variant. Vascular: Calcific atherosclerosis.  No hyperdense vessel. Skull: No acute fracture. Small bony hyperostosis along the right frontal calvarium. Sinuses/Orbits: Small right maxillary sinus with mucosal thickening, possibly the sequela of chronic sinusitis. Otherwise, sinuses are largely clear. Unremarkable orbits. Other: No mastoid effusions. IMPRESSION: Ill-defined hypoattenuation involving the left parietal white matter with slight loss of gray-white differentiation, concerning for age indeterminate infarct. Given the patient's symptoms and absence of priors for comparison, recommend MRI to further evaluate. Code stroke imaging results were communicated on 05/27/2020 at 7:01 pm to provider Dr. Vanita Panda Via telephone, who verbally acknowledged these results. Electronically Signed   By: Margaretha Sheffield MD   On: 05/27/2020 19:07    Procedures Procedures   Medications Ordered in ED Medications  labetalol (NORMODYNE) injection 10 mg (10 mg  Intravenous Given 05/27/20 1922)  clevidipine (CLEVIPREX) infusion 0.5 mg/mL (has no administration in time range)  iohexol (OMNIPAQUE) 350 MG/ML injection 100 mL (100 mLs Intravenous Contrast Given 05/27/20 1947)    ED Course  I have reviewed the triage vital signs and the nursing notes.  Pertinent labs & imaging results that were available during my care of the patient were reviewed by me and considered in my medical decision making (see chart for details).   With consideration of stroke as etiology for his new dysarthria, patient had expedited work-up with neurology for via telemedicine, was placed on continuous cardiac monitoring, pulse oximetry Cardiac 80s sinus unremarkable Pulse ox 99% room air normal   Update: Symptoms continue to improve.  Have again discussed his case with our neurologist via telemedicine monitor, in the room.  Patient states that he cannot have an MRI, due to anxiety.  He is however, amenable to CTA, perfusion study, which are ordered. His blood pressure remains elevated, greater than 774 systolic after initial dose of labetalol.  I've discussed his case with Dr. Almyra Free (ED), Dr. Curly Shores and Dr. Leonel Ramsay.  Given concern for his hypertension, dysarthria, patient will transfer to our affiliated emergency department for likely hospitalist admission. MDM Rules/Calculators/A&P MDM Number of Diagnoses or Management Options Dysarthria: new, needed workup Hypertensive urgency: new, needed workup Vision disturbance: new, needed workup   Amount and/or Complexity of Data Reviewed Clinical lab tests: reviewed and ordered Tests in the radiology section of CPT: ordered and reviewed Tests in the medicine section of CPT: reviewed and ordered Discussion of test  results with the performing providers: yes Decide to obtain previous medical records or to obtain history from someone other than the patient: yes Obtain history from someone other than the patient: yes Discuss  the patient with other providers: yes Independent visualization of images, tracings, or specimens: yes  Risk of Complications, Morbidity, and/or Mortality Presenting problems: high Diagnostic procedures: high Management options: high  Critical Care Total time providing critical care: 30-74 minutes (35)  Patient Progress Patient progress: stable  Final Clinical Impression(s) / ED Diagnoses Final diagnoses:  Dysarthria  Vision disturbance  Hypertensive urgency     Carmin Muskrat, MD 05/27/20 2251

## 2020-05-27 NOTE — ED Notes (Signed)
Code stroke called

## 2020-05-27 NOTE — Consult Note (Signed)
[facility] Telemedicine Consult  Requesting Provider: Carmin Muskrat Consult Participants: Patient, wife, bedside nurse, atrium nurse Location of the provider: [place], [place] Location of the patient: [facility]   This consult was provided via telemedicine with 2-way video and audio communication. The patient/family was informed that care would be provided in this way and agreed to receive care in this manner.   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  He had to put incoming calls on hold because he knew what he wanted to say but could not get the words out.  He drove himself home and reported his symptoms to his wife who brought him to the ED for further evaluation.  On initial ED arrival his blood pressure was in the 200s which is unusual for him, glucose was in the 240s.  His aphasia and visual field deficit rapidly improved over the course of my evaluation and had already markedly improved since the peak of his symptoms at the beginning of my evaluation.  Given age-indeterminate left parietal hypodensity and rapidly improving symptoms, decision was made not to give TPA given his significant history of vasculopathy, concern was for potential LVO with intermittently effective collaterals for which CTA/CTP was obtained    LKW: 16:00 tpa given?: No, symptoms minor/rapidly improving and age indeterminant left parietal hypodensity concerning for potential subacute infarct Modified Rankin Scale: 0-Completely asymptomatic  Time of teleneurologist evaluation: 7:00 PM  Past  Medical History:  Diagnosis Date  . Angina    NO ANGINA SINCE CABG  . Arthritis    right ankle  . Chronic daily headache    "@ least every other day""improved since heart surgery"  . Coronary artery disease    PT STATES HEART DOING WELL SINCE CABG SURGERY - DR. TILLEY IS HIS CARDIOLOGIST  . Diabetes mellitus    Lantus x 10 yrs  . Gout   . History of kidney stones 09-08-12   multiple times with some lithotripsies  . Hyperlipemia   . Hypertension     Past Surgical History:  Procedure Laterality Date  . BACK SURGERY     microsurgery: spinal stenosis -lumbar  . bone spur  1990's   right great toe; "probably related to gout"  . CARDIAC CATHETERIZATION  04/27/11  . CATARACT EXTRACTION W/ INTRAOCULAR LENS IMPLANT  1990's   right; "lens was replaced twice over 3 months; lens is actually over iris"  . CORONARY ARTERY BYPASS GRAFT  04/30/2011   Procedure: CORONARY ARTERY BYPASS GRAFTING (CABG);  Surgeon: Melrose Nakayama, MD;  Location: [place];  Service: Open Heart Surgery;  Laterality: N/A;,x4 vessels  . CYSTOSCOPY W/ URETERAL STENT PLACEMENT Right 08/19/2012   Procedure: CYSTOSCOPY WITH RETROGRADE PYELOGRAM/URETERAL STENT PLACEMENT;  Surgeon: Molli Hazard, MD;  [facility];  Service: Urology;  Laterality: Right;  . CYSTOSCOPY WITH RETROGRADE PYELOGRAM, URETEROSCOPY AND STENT PLACEMENT Left 07/17/2013   Procedure: CYSTOSCOPY, LEFT URETEROSCOPY, BASKET EXTRACTION OF STONE, INSERTION OF LEFT URETERAL STENT, ;  Surgeon: Jorja Loa, MD;  [facility];  Service: Urology;  Laterality: Left;  . CYSTOSCOPY/RETROGRADE/URETEROSCOPY Right 09/12/2012   Procedure: CYSTOSCOPY/RETROGRADE/URETEROSCOPY;  Surgeon: Franchot Gallo, MD;  [facility];  Service: Urology;  Laterality: Right;  . HOLMIUM LASER APPLICATION Right 11/21/1939   Procedure: HOLMIUM LASER APPLICATION;  Surgeon: Franchot Gallo, MD;  [facility];  Service: Urology;  Laterality: Right;  . HOLMIUM  LASER APPLICATION Left 7/40/8144   Procedure: HOLMIUM LASER APPLICATION;  Surgeon: Jorja Loa, MD;  [facility];  Service: Urology;  Laterality: Left;  . KIDNEY STONE SURGERY     "probably 3 times"  . LEFT HEART CATHETERIZATION WITH CORONARY ANGIOGRAM N/A 04/28/2011   Procedure: LEFT HEART CATHETERIZATION WITH CORONARY ANGIOGRAM;  Surgeon: Jacolyn Reedy, MD;  Location: [facility] C[facility];  Service: Cardiovascular;  Laterality: N/A;  . LITHOTRIPSY     "many; probably 5 times"  . RADIAL ARTERY HARVEST  04/30/2011   Procedure: RADIAL ARTERY HARVEST;  Surgeon: Melrose Nakayama, MD;  Location: [place];  Service: Open Heart Surgery;  Laterality: Left;  . RETINAL DETACHMENT SURGERY  1990's   right eye; "made me have an early cataract"  . VASECTOMY     Current Outpatient Medications  Medication Instructions  . ACCU-CHEK GUIDE test strip USE TO TEST BLOOD SUGAR 2 TO 3 TIMES A DAY  . allopurinol (ZYLOPRIM) 300 mg, Daily  . aspirin EC 81 mg, Every morning  . atorvastatin (LIPITOR) 40 mg, Oral, Daily  . B-D ULTRAFINE III SHORT PEN 31G X 8 MM MISC USE UTD WITH FLEXPEN.  Nancee Liter KwikPen 100 Units, Subcutaneous, Every morning, In AM  . Biotin 5,000 mcg, Daily  . bisoprolol (ZEBETA) 10 mg, Oral, Daily  . bisoprolol (ZEBETA) 5 mg, Oral, Daily  . fish oil-omega-3 fatty acids 1 g, 2 times daily  . glucosamine-chondroitin 500-400 MG tablet 2 tablets, Daily  . insulin aspart (NOVOLOG FLEXPEN) 100 UNIT/ML FlexPen Subcutaneous, As needed  . Ipratropium-Albuterol (COMBIVENT) 20-100 MCG/ACT AERS respimat 1 puff, Inhalation, Every 6 hours PRN  . irbesartan (AVAPRO) 300 mg, Oral, Daily  . L-Lysine HCl 1000 MG TABS 1 tablet, Oral, Daily  . LORazepam (ATIVAN) 0.5 mg, Oral, Daily PRN  . magnesium oxide (MAG-OX) 400 mg, 2 times daily  . nitroGLYCERIN (NITROSTAT) 0.4 mg, Sublingual, As needed  . olmesartan (BENICAR) 10 mg, Oral, Daily  . omeprazole (PRILOSEC) 20 mg, Oral, Daily PRN  . Praluent 150  mg, Subcutaneous, Every 14 days  . tamsulosin (FLOMAX) 0.4 mg, Oral, Daily PRN  . traMADol (ULTRAM) 50 mg, Oral, Every 6 hours PRN  . VENTOLIN HFA 108 (90 Base) MCG/ACT inhaler 2 puffs, Inhalation, Every 6 hours PRN  . vitamin C (ASCORBIC ACID) 500 mg, Oral, Daily  . Vitamin D3 5,000 Units, Daily  . VITAMIN E PO 1 capsule, Daily    Exam: Vitals:   05/27/20 1837  BP: (!) 200/104  Pulse: 76  Resp: (!) 21  Temp: 98.6 F (37 C)  SpO2: 98%    General: Comfortable, anxious at times Pulmonary: breathing comfortably Cardiac: regular rate and rhythm on monitor   NIH Stroke scale 1A: Level of Consciousness - 0 1B: Ask Month and Age - 0 -- very slow to respond initially  1C: 'Blink Eyes' & 'Squeeze Hands' - 0 2: Test Horizontal Extraocular Movements - 0 3: Test Visual Fields - 0 (initially 1 for RUQ field cut)  4: Test Facial Palsy - 0  5A: Test Left Arm Motor Drift - 0 5B: Test Right Arm Motor Drift - 0 6A: Test Left Leg Motor Drift - 0 6B: Test Right Leg Motor Drift -  0 7: Test Limb Ataxia - 0 8: Test Sensation - 0 9: Test Language/Aphasia- 1 10: Test Dysarthria - 0 11: Test Extinction/Inattention - 0 NIHSS score: 1   Imaging Reviewed:  Head CT with age-indeterminate high left parietal hypodensity, potentially concerning for subacute infarct on review with radiology CTA/CTP -- CTP non diagnostic   Labs reviewed in epic and pertinent values follow: Cr 1.8 up from prior of 1.5 with    Assessment: This is a 70 year old man with a past medical history significant for hypertension, hyperlipidemia, diabetes, coronary artery disease, presenting with rapidly resolving aphasia and right-sided visual field deficit concerning for left MCA territory stroke.  Suspect he had a larger embolic event which largely if not completely has resolved.  tPA initially discussed but then due to continued improvement of aphasia during the time of discussion, risks felt to outweigh benefit especially  given parietal lesion concerning for potential subacute stroke.   Recommendations:   # L MCA territory stroke - Stroke labs HgbA1c, fasting lipid panel - MRI brain when able, patient will need premedication given anxiety - Frequent neuro checks - Echocardiogram - Continue home ASA 81 mg daily - Plavix 300 mg load with 75 mg daily with final course per stroke team - Risk factor modification - Telemetry monitoring; 30 day event monitor on discharge if no arrythmias captured  - Blood pressure goal   - Permissive hypertension to 220/120 due to potential LVO / severe M2 stenosis  - PT consult, OT consult, Speech consult, unless patient is back to baseline - Stroke team to follow  This patient is receiving care for possible acute neurological changes. There was 82 minutes of care by this provider at the time of service, including time for direct evaluation via telemedicine, review of medical records, imaging studies and discussion of findings with providers, the patient and/or family.  Lesleigh Noe MD-PhD [facility] [phone]  If 8pm-8am, please page neurology on call as listed in [facility].

## 2020-05-28 ENCOUNTER — Emergency Department (HOSPITAL_BASED_OUTPATIENT_CLINIC_OR_DEPARTMENT_OTHER): Payer: Medicare Other

## 2020-05-28 DIAGNOSIS — I6389 Other cerebral infarction: Secondary | ICD-10-CM

## 2020-05-28 DIAGNOSIS — N179 Acute kidney failure, unspecified: Secondary | ICD-10-CM

## 2020-05-28 DIAGNOSIS — Z794 Long term (current) use of insulin: Secondary | ICD-10-CM | POA: Diagnosis not present

## 2020-05-28 DIAGNOSIS — N1832 Chronic kidney disease, stage 3b: Secondary | ICD-10-CM | POA: Diagnosis not present

## 2020-05-28 DIAGNOSIS — N189 Chronic kidney disease, unspecified: Secondary | ICD-10-CM

## 2020-05-28 DIAGNOSIS — R4701 Aphasia: Secondary | ICD-10-CM | POA: Diagnosis not present

## 2020-05-28 DIAGNOSIS — G459 Transient cerebral ischemic attack, unspecified: Secondary | ICD-10-CM

## 2020-05-28 DIAGNOSIS — I6300 Cerebral infarction due to thrombosis of unspecified precerebral artery: Secondary | ICD-10-CM

## 2020-05-28 DIAGNOSIS — E119 Type 2 diabetes mellitus without complications: Secondary | ICD-10-CM | POA: Diagnosis not present

## 2020-05-28 DIAGNOSIS — Z951 Presence of aortocoronary bypass graft: Secondary | ICD-10-CM | POA: Diagnosis not present

## 2020-05-28 LAB — HEMOGLOBIN A1C
Hgb A1c MFr Bld: 8.4 % — ABNORMAL HIGH (ref 4.8–5.6)
Mean Plasma Glucose: 194.38 mg/dL

## 2020-05-28 LAB — ECHOCARDIOGRAM COMPLETE
Area-P 1/2: 2.62 cm2
Height: 71 in
S' Lateral: 3.6 cm
Weight: 3840 oz

## 2020-05-28 LAB — CBG MONITORING, ED
Glucose-Capillary: 75 mg/dL (ref 70–99)
Glucose-Capillary: 61 mg/dL — ABNORMAL LOW (ref 70–99)
Glucose-Capillary: 194 mg/dL — ABNORMAL HIGH (ref 70–99)
Glucose-Capillary: 159 mg/dL — ABNORMAL HIGH (ref 70–99)

## 2020-05-28 LAB — LIPID PANEL
Cholesterol: 176 mg/dL (ref 0–200)
HDL: 34 mg/dL — ABNORMAL LOW (ref >40)
LDL Cholesterol: 119 mg/dL — ABNORMAL HIGH (ref 0–99)
Total CHOL/HDL Ratio: 5.2 RATIO
Triglycerides: 115 mg/dL (ref <150)
VLDL: 23 mg/dL (ref 0–40)

## 2020-05-28 MED ORDER — ATORVASTATIN CALCIUM 40 MG PO TABS
40.0000 mg | ORAL_TABLET | Freq: Every day | ORAL | Status: DC
  Administered 2020-05-28: 40 mg via ORAL
  Filled 2020-05-28: qty 1

## 2020-05-28 MED ORDER — INSULIN ASPART 100 UNIT/ML ~~LOC~~ SOLN
0.0000 [IU] | Freq: Three times a day (TID) | SUBCUTANEOUS | Status: DC
  Administered 2020-05-28: 4 [IU] via SUBCUTANEOUS

## 2020-05-28 MED ORDER — TRAMADOL HCL 50 MG PO TABS
50.0000 mg | ORAL_TABLET | Freq: Four times a day (QID) | ORAL | Status: DC | PRN
  Administered 2020-05-28: 50 mg via ORAL
  Filled 2020-05-28: qty 1

## 2020-05-28 MED ORDER — CLOPIDOGREL BISULFATE 75 MG PO TABS: 75.0000 mg | ORAL_TABLET | Freq: Every day | ORAL | 0 refills | Status: DC

## 2020-05-28 MED ORDER — INSULIN ASPART 100 UNIT/ML ~~LOC~~ SOLN: 0.0000 [IU] | Freq: Every day | SUBCUTANEOUS | Status: DC

## 2020-05-28 MED ORDER — INSULIN ASPART 100 UNIT/ML ~~LOC~~ SOLN: 20.0000 [IU] | Freq: Once | SUBCUTANEOUS | Status: DC

## 2020-05-28 MED ORDER — INSULIN GLARGINE 100 UNIT/ML ~~LOC~~ SOLN
50.0000 [IU] | Freq: Every day | SUBCUTANEOUS | Status: DC
  Administered 2020-05-28: 50 [IU] via SUBCUTANEOUS
  Filled 2020-05-28: qty 0.5

## 2020-05-28 MED ORDER — ASPIRIN EC 81 MG PO TBEC: 81.0000 mg | DELAYED_RELEASE_TABLET | Freq: Every morning | ORAL | Status: DC

## 2020-05-28 MED ORDER — SODIUM CHLORIDE 0.9 % IV BOLUS
500.0000 mL | Freq: Once | INTRAVENOUS | Status: AC
  Administered 2020-05-28: 500 mL via INTRAVENOUS

## 2020-05-28 NOTE — ED Notes (Signed)
Provided pt with 2 cups of Apple juice and watched pt drink it.

## 2020-05-28 NOTE — Progress Notes (Signed)
  Echocardiogram 2D Echocardiogram has been performed.  Randa Lynn Shanessa Hodak 05/28/2020, 2:54 PM

## 2020-05-28 NOTE — ED Notes (Signed)
Pt given Stroke book and a quick review with understanding voiced

## 2020-05-28 NOTE — Progress Notes (Signed)
SLP Cancellation Note  Patient Details Name: Mitchell Lambert MRN: [id number] DOB: 1950/06/16   Cancelled evaluation: Attempted to see pt earlier this afternoon but he was with Echo.  Orders were for speech/language eval only, not swallow eval since he had passed [facility] yesterday. D/C is pending today. If speech issues have not resolved,he will need OP assessment. If he is back at baseline, no SLP f/u will be needed.                                                                                                   Mitchell Lambert, [place] CCC/SLP Acute Rehabilitation Services Office number [phone] Pager [phone]    Mitchell Lambert 05/28/2020, 3:13 PM

## 2020-05-28 NOTE — Evaluation (Signed)
Physical Therapy Evaluation and Discharge Patient Details Name: Mitchell Lambert MRN: [id number] DOB: 06-04-1950 Today's Date: 05/28/2020   History of Present Illness  Pt is a 70 y/o male admitted 3/28 secondary to R visual changes and aphasia. MRI negative for acute abnormality. PMH includes HTN, CVA, asthma, DM, and CAD s/p CABG.  Clinical Impression  Patient evaluated by Physical Therapy with no further acute PT needs identified. All education has been completed and the patient has no further questions. Pt overall steady with mobility. Reports L foot and back issues at baseline. No LOB noted and overall at a mod I level with transfers and gait. Educated about "BE FAST" acronym in recognizing CVA symptoms. See below for any follow-up Physical Therapy or equipment needs. PT is signing off. Thank you for this referral. If needs change, please re-consult.      Follow Up Recommendations No PT follow up    Equipment Recommendations  None recommended by PT    Recommendations for Other Services       Precautions / Restrictions Precautions Precautions: None Restrictions Weight Bearing Restrictions: No      Mobility  Bed Mobility Overal bed mobility: Needs Assistance Bed Mobility: Supine to Sit;Sit to Supine     Supine to sit: Min assist Sit to supine: Modified independent (Device/Increase time)   General bed mobility comments: Min A to come to sitting on stretcher. Required assist for trunk elevation as stretcher was uneven.    Transfers Overall transfer level: Modified independent                  Ambulation/Gait Ambulation/Gait assistance: Modified independent (Device/Increase time) Gait Distance (Feet): 200 Feet Assistive device: None Gait Pattern/deviations: Decreased weight shift to left Gait velocity: Decreased   General Gait Details: Pt reports walking with a "limp" at baseline secondary to L achilles injury. No LOB noted. Slower speed. Able to perform  vertical and horizontal head turns without LOB.  Stairs Stairs: Yes       General stair comments: Had pt march in place for stair clearance simulation as no steps in ED. No LOB noted.  Wheelchair Mobility    Modified Rankin (Stroke Patients Only)       Balance Overall balance assessment: No apparent balance deficits (not formally assessed)                                           Pertinent Vitals/Pain Pain Assessment: 0-10 Pain Score: 5  Pain Location: headache Pain Descriptors / Indicators: Headache Pain Intervention(s): Limited activity within patient's tolerance;Monitored during session;Repositioned    Home Living Family/patient expects to be discharged to:: Private residence Living Arrangements: Spouse/significant other Available Help at Discharge: Family Type of Home: House Home Access: Stairs to enter Entrance Stairs-Rails: None Technical brewer of Steps: 2 Home Layout: Two level Home Equipment: Clinical cytogeneticist - 2 wheels;Crutches      Prior Function Level of Independence: Independent         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 Reports he walks with a limp at baseline    Cervical / Trunk  Assessment Cervical / Trunk Assessment: Other exceptions Cervical / Trunk Exceptions: has hematoma on back secondary to fall on ice.  Communication   Communication: Expressive difficulties (mild word finding difficulty noted)  Cognition Arousal/Alertness: Awake/alert Behavior During Therapy: WFL for tasks assessed/performed Overall Cognitive Status: Within Functional Limits for tasks assessed                                  General Comments: Mild word finding difficulty noted. Had difficulty coming up with the word stroke and called a walker a "pusher"      General Comments General comments (skin integrity, edema, etc.): Educated about "BE FAST" acronym in recognizing CVA symptoms.    Exercises     Assessment/Plan    PT Assessment Patent does not need any further PT services  PT Problem List         PT Treatment Interventions      PT Goals (Current goals can be found in the Care Plan section)  Acute Rehab PT Goals Patient Stated Goal: to go home PT Goal Formulation: With patient Time For Goal Achievement: 05/28/20 Potential to Achieve Goals: Good    Frequency     Barriers to discharge        Co-evaluation               AM-PAC PT "6 Clicks" Mobility  Outcome Measure Help needed turning from your back to your side while in a flat bed without using bedrails?: None Help needed moving from lying on your back to sitting on the side of a flat bed without using bedrails?: A Little Help needed moving to and from a bed to a chair (including a wheelchair)?: None Help needed standing up from a chair using your arms (e.g., wheelchair or bedside chair)?: None Help needed to walk in hospital room?: None Help needed climbing 3-5 steps with a railing? : A Little 6 Click Score: 22    End of Session Equipment Utilized During Treatment: Gait belt Activity Tolerance: Patient tolerated treatment well Patient left: in bed;with call bell/phone within reach;with family/visitor present (on stretcher in ED) Nurse Communication: Mobility status PT Visit Diagnosis: Other symptoms and signs involving the nervous system (R29.898)    Time: 3845-3646 PT Time Calculation (min) (ACUTE ONLY): 25 min   Charges:   PT Evaluation $PT Eval Low Complexity: 1 Low PT Treatments $Gait Training: 8-22 mins        Lou Miner, DPT  Acute Rehabilitation Services  Pager: [phone] Office: [phone]   Rudean Hitt 05/28/2020, 2:18 PM

## 2020-05-28 NOTE — ED Notes (Signed)
AVS printed, given and reviewed with pt and wife.  Both aware of importance of follow up.  Orders received from Dr Starla Link to D/C home.  Dr. Starla Link aware pt has not been seen by speech therapy.  Pt has been eating and drinking without any difficulties.  Modified NIH has remained a Zero.  No complaints

## 2020-05-28 NOTE — Progress Notes (Signed)
STROKE TEAM PROGRESS NOTE   INTERVAL HISTORY No acute events since arrival.  Hypertensive to 160s this morning.   He reports episode of about 60 minutes of  difficulty speaking and vision disturbance on the right which onset at work yesterday. Symptoms were nearly  resolved by the time he got initially evaluated in the ED so not  tPA candidate. BP was elevated to 200 in the ED. NIHSS1.  Also reports unexplained dizzy spells lately for few months. Description is of mild transient episodes.  Denies knowledge of previous stroke,   Headaches for years, unchanged and infrequent.   Wife is present at bedside. We discussed current work up, findings and ongoing plan of care including high suspicion for atrial fibrillation with possible need for LOOP placement. They state understanding and questions were addressed.   Vitals:   05/28/20 0500 05/28/20 0545 05/28/20 0615 05/28/20 0915  BP: (!) 156/62 (!) 167/83 (!) 172/79 (!) 151/78  Pulse: (!) 52 (!) 51 (!) 55 66  Resp: 18 17 19 19   Temp:      TempSrc:      SpO2: 96% 95% 97% 97%  Weight:      Height:       CBC:  Recent Labs  Lab 05/27/20 1905 05/27/20 1915  WBC 6.5  --   NEUTROABS 4.2  --   HGB 14.3 13.6  HCT 41.7 40.0  MCV 92.5  --   PLT 195  --    Basic Metabolic Panel:  Recent Labs  Lab 05/27/20 1905 05/27/20 1915  NA 139 142  K 4.4 4.4  CL 108 107  CO2 23  --   GLUCOSE 244* 240*  BUN 35* 34*  CREATININE 1.88* 1.80*  CALCIUM 8.6*  --    Lipid Panel:  Recent Labs  Lab 05/28/20 0336  CHOL 176  TRIG 115  HDL 34*  CHOLHDL 5.2  VLDL 23  LDLCALC 119*   HgbA1c:  Recent Labs  Lab 05/28/20 0336  HGBA1C 8.4*   Urine Drug Screen:  Recent Labs  Lab 05/27/20 2133  LABOPIA NONE DETECTED  COCAINSCRNUR NONE DETECTED  LABBENZ NONE DETECTED  AMPHETMU NONE DETECTED  THCU NONE DETECTED  LABBARB NONE DETECTED    Alcohol Level  Recent Labs  Lab 05/27/20 1905  ETH <10    IMAGING past 24 hours CT ANGIO HEAD W OR  WO CONTRAST  Result Date: 05/27/2020 CLINICAL DATA:  Expressive aphasia and confusion EXAM: CT ANGIOGRAPHY HEAD AND NECK CT PERFUSION BRAIN TECHNIQUE: Multidetector CT imaging of the head and neck was performed using the standard protocol during bolus administration of intravenous contrast. Multiplanar CT image reconstructions and MIPs were obtained to evaluate the vascular anatomy. Carotid stenosis measurements (when applicable) are obtained utilizing NASCET criteria, using the distal internal carotid diameter as the denominator. Multiphase CT imaging of the brain was performed following IV bolus contrast injection. Subsequent parametric perfusion maps were calculated using RAPID software. CONTRAST:  136mL OMNIPAQUE IOHEXOL 350 MG/ML SOLN COMPARISON:  None. FINDINGS: CTA NECK FINDINGS SKELETON: There is no bony spinal canal stenosis. No lytic or blastic lesion. OTHER NECK: Normal pharynx, larynx and major salivary glands. No cervical lymphadenopathy. Unremarkable thyroid gland. UPPER CHEST: No pneumothorax or pleural effusion. No nodules or masses. AORTIC ARCH: There is calcific atherosclerosis of the aortic arch. There is no aneurysm, dissection or hemodynamically significant stenosis of the visualized portion of the aorta. Conventional 3 vessel aortic branching pattern. The visualized proximal subclavian arteries are widely patent. RIGHT CAROTID  SYSTEM: Normal without aneurysm, dissection or stenosis. LEFT CAROTID SYSTEM: Normal without aneurysm, dissection or stenosis. VERTEBRAL ARTERIES: Right dominant configuration. Poor visualization of left origin. There is no dissection, occlusion or flow-limiting stenosis to the skull base (V1-V3 segments). CTA HEAD FINDINGS POSTERIOR CIRCULATION: --Vertebral arteries: Moderate atherosclerotic calcification of the right V4 segment. Left V4 terminates in PICA. --Inferior cerebellar arteries: Normal. --Basilar artery: Normal. --Superior cerebellar arteries: Normal.  --Posterior cerebral arteries (PCA): There is severe stenosis/short segment occlusion of the proximal right P2 segment. The distal right PCA is patent. ANTERIOR CIRCULATION: --Intracranial internal carotid arteries: Normal. --Anterior cerebral arteries (ACA): Normal. Both A1 segments are present. Patent anterior communicating artery (a-comm). --Middle cerebral arteries (MCA): Normal. VENOUS SINUSES: As permitted by contrast timing, patent. ANATOMIC VARIANTS: None Review of the MIP images confirms the above findings. CT Brain Perfusion Findings: Technically inadequate CT perfusion study. IMPRESSION: 1. No emergent large vessel occlusion. 2. Severe stenosis/short segment occlusion of the proximal right P2 segment. 3. Technically inadequate CT perfusion study. Aortic Atherosclerosis (ICD10-I70.0). Electronically Signed   By: Ulyses Jarred M.D.   On: 05/27/2020 20:42   CT ANGIO NECK W OR WO CONTRAST  Result Date: 05/27/2020 CLINICAL DATA:  Expressive aphasia and confusion EXAM: CT ANGIOGRAPHY HEAD AND NECK CT PERFUSION BRAIN TECHNIQUE: Multidetector CT imaging of the head and neck was performed using the standard protocol during bolus administration of intravenous contrast. Multiplanar CT image reconstructions and MIPs were obtained to evaluate the vascular anatomy. Carotid stenosis measurements (when applicable) are obtained utilizing NASCET criteria, using the distal internal carotid diameter as the denominator. Multiphase CT imaging of the brain was performed following IV bolus contrast injection. Subsequent parametric perfusion maps were calculated using RAPID software. CONTRAST:  11mL OMNIPAQUE IOHEXOL 350 MG/ML SOLN COMPARISON:  None. FINDINGS: CTA NECK FINDINGS SKELETON: There is no bony spinal canal stenosis. No lytic or blastic lesion. OTHER NECK: Normal pharynx, larynx and major salivary glands. No cervical lymphadenopathy. Unremarkable thyroid gland. UPPER CHEST: No pneumothorax or pleural effusion. No  nodules or masses. AORTIC ARCH: There is calcific atherosclerosis of the aortic arch. There is no aneurysm, dissection or hemodynamically significant stenosis of the visualized portion of the aorta. Conventional 3 vessel aortic branching pattern. The visualized proximal subclavian arteries are widely patent. RIGHT CAROTID SYSTEM: Normal without aneurysm, dissection or stenosis. LEFT CAROTID SYSTEM: Normal without aneurysm, dissection or stenosis. VERTEBRAL ARTERIES: Right dominant configuration. Poor visualization of left origin. There is no dissection, occlusion or flow-limiting stenosis to the skull base (V1-V3 segments). CTA HEAD FINDINGS POSTERIOR CIRCULATION: --Vertebral arteries: Moderate atherosclerotic calcification of the right V4 segment. Left V4 terminates in PICA. --Inferior cerebellar arteries: Normal. --Basilar artery: Normal. --Superior cerebellar arteries: Normal. --Posterior cerebral arteries (PCA): There is severe stenosis/short segment occlusion of the proximal right P2 segment. The distal right PCA is patent. ANTERIOR CIRCULATION: --Intracranial internal carotid arteries: Normal. --Anterior cerebral arteries (ACA): Normal. Both A1 segments are present. Patent anterior communicating artery (a-comm). --Middle cerebral arteries (MCA): Normal. VENOUS SINUSES: As permitted by contrast timing, patent. ANATOMIC VARIANTS: None Review of the MIP images confirms the above findings. CT Brain Perfusion Findings: Technically inadequate CT perfusion study. IMPRESSION: 1. No emergent large vessel occlusion. 2. Severe stenosis/short segment occlusion of the proximal right P2 segment. 3. Technically inadequate CT perfusion study. Aortic Atherosclerosis (ICD10-I70.0). Electronically Signed   By: Ulyses Jarred M.D.   On: 05/27/2020 20:42   MR BRAIN WO CONTRAST  Result Date: 05/28/2020 CLINICAL DATA:  Aphasia EXAM: MRI HEAD WITHOUT  CONTRAST TECHNIQUE: Multiplanar, multiecho pulse sequences of the brain and  surrounding structures were obtained without intravenous contrast. COMPARISON:  None. FINDINGS: Brain: No acute infarct, mass effect or extra-axial collection. No acute or chronic hemorrhage. Old left parietal infarct. White matter signal otherwise normal. Normal volume of CSF spaces. Incidentally noted cavum septum pellucidum et vergae. Vascular: Major flow voids are preserved. Skull and upper cervical spine: Normal calvarium and skull base. Visualized upper cervical spine and soft tissues are normal. Sinuses/Orbits:No paranasal sinus fluid levels or advanced mucosal thickening. No mastoid or middle ear effusion. Normal orbits. IMPRESSION: 1. No acute intracranial abnormality. 2. Old left parietal infarct. Electronically Signed   By: Ulyses Jarred M.D.   On: 05/28/2020 00:20   CT CEREBRAL PERFUSION W CONTRAST  Result Date: 05/27/2020 CLINICAL DATA:  Expressive aphasia and confusion EXAM: CT ANGIOGRAPHY HEAD AND NECK CT PERFUSION BRAIN TECHNIQUE: Multidetector CT imaging of the head and neck was performed using the standard protocol during bolus administration of intravenous contrast. Multiplanar CT image reconstructions and MIPs were obtained to evaluate the vascular anatomy. Carotid stenosis measurements (when applicable) are obtained utilizing NASCET criteria, using the distal internal carotid diameter as the denominator. Multiphase CT imaging of the brain was performed following IV bolus contrast injection. Subsequent parametric perfusion maps were calculated using RAPID software. CONTRAST:  156mL OMNIPAQUE IOHEXOL 350 MG/ML SOLN COMPARISON:  None. FINDINGS: CTA NECK FINDINGS SKELETON: There is no bony spinal canal stenosis. No lytic or blastic lesion. OTHER NECK: Normal pharynx, larynx and major salivary glands. No cervical lymphadenopathy. Unremarkable thyroid gland. UPPER CHEST: No pneumothorax or pleural effusion. No nodules or masses. AORTIC ARCH: There is calcific atherosclerosis of the aortic arch.  There is no aneurysm, dissection or hemodynamically significant stenosis of the visualized portion of the aorta. Conventional 3 vessel aortic branching pattern. The visualized proximal subclavian arteries are widely patent. RIGHT CAROTID SYSTEM: Normal without aneurysm, dissection or stenosis. LEFT CAROTID SYSTEM: Normal without aneurysm, dissection or stenosis. VERTEBRAL ARTERIES: Right dominant configuration. Poor visualization of left origin. There is no dissection, occlusion or flow-limiting stenosis to the skull base (V1-V3 segments). CTA HEAD FINDINGS POSTERIOR CIRCULATION: --Vertebral arteries: Moderate atherosclerotic calcification of the right V4 segment. Left V4 terminates in PICA. --Inferior cerebellar arteries: Normal. --Basilar artery: Normal. --Superior cerebellar arteries: Normal. --Posterior cerebral arteries (PCA): There is severe stenosis/short segment occlusion of the proximal right P2 segment. The distal right PCA is patent. ANTERIOR CIRCULATION: --Intracranial internal carotid arteries: Normal. --Anterior cerebral arteries (ACA): Normal. Both A1 segments are present. Patent anterior communicating artery (a-comm). --Middle cerebral arteries (MCA): Normal. VENOUS SINUSES: As permitted by contrast timing, patent. ANATOMIC VARIANTS: None Review of the MIP images confirms the above findings. CT Brain Perfusion Findings: Technically inadequate CT perfusion study. IMPRESSION: 1. No emergent large vessel occlusion. 2. Severe stenosis/short segment occlusion of the proximal right P2 segment. 3. Technically inadequate CT perfusion study. Aortic Atherosclerosis (ICD10-I70.0). Electronically Signed   By: Ulyses Jarred M.D.   On: 05/27/2020 20:42   CT HEAD CODE STROKE WO CONTRAST  Result Date: 05/27/2020 CLINICAL DATA:  Code stroke.  Aphasia. EXAM: CT HEAD WITHOUT CONTRAST TECHNIQUE: Contiguous axial images were obtained from the base of the skull through the vertex without intravenous contrast.  COMPARISON:  None. FINDINGS: Brain: No evidence of acute large vascular territory infarct. No acute hemorrhage. No mass lesion or abnormal mass effect. No hydrocephalus. Cavum septum pellucidum et vergae, anatomic variant. Vascular: Calcific atherosclerosis.  No hyperdense vessel. Skull: No acute fracture. Small  bony hyperostosis along the right frontal calvarium. Sinuses/Orbits: Small right maxillary sinus with mucosal thickening, possibly the sequela of chronic sinusitis. Otherwise, sinuses are largely clear. Unremarkable orbits. Other: No mastoid effusions. IMPRESSION: Ill-defined hypoattenuation involving the left parietal white matter with slight loss of gray-white differentiation, concerning for age indeterminate infarct. Given the patient's symptoms and absence of priors for comparison, recommend MRI to further evaluate. Code stroke imaging results were communicated on 05/27/2020 at 7:01 pm to provider Dr. Vanita Panda Via telephone, who verbally acknowledged these results. Electronically Signed   By: Margaretha Sheffield MD   On: 05/27/2020 19:07   PHYSICAL EXAM Pleasant elderly Caucasian male not in distress. . Afebrile. Head is nontraumatic. Neck is supple without bruit.    Cardiac exam no murmur or gallop. Lungs are clear to auscultation. Distal pulses are well felt. Neurological Exam ;  Awake  Alert oriented x 3. Normal speech and language.eye movements full without nystagmus.fundi were not visualized. Vision acuity and fields appear normal. Hearing is normal. Palatal movements are normal. Face symmetric. Tongue midline. Normal strength, tone, reflexes and coordination. Normal sensation. Gait deferred.  ASSESSMENT/PLAN  Mr. 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 He had to put incoming calls on hold because he knew what he wanted to say but could not get the words out.  He drove himself home and reported his symptoms to his wife who brought him to the ED for further evaluation.  On initial ED arrival his blood pressure was in the 200s which is unusual for him, glucose was in the 240s. His aphasia and visual field deficit rapidly improved over the course of my evaluation and had already markedly improved since the peak of his symptoms at the beginning of my evaluation.  Given age-indeterminate left parietal hypodensity and rapidly improving symptoms, decision was made not to give TPA given his significant history of vasculopathy, concern was for potential LVO with intermittently effective collaterals for which CTA/CTP was obtained    Stroke like episode involving Left MCA territory, likely TIA due to cryptogenic embolic source.   Head CT  age-indeterminate high left parietal hypodensity, potentially concerning for subacute infarct   CTA/CTP No LVO. Severe stenosis/short segment occlusion of the proximal right P2 Segment.  MRI:  Old left parietal infarct.   2D Echo pending  LDL 119  HgbA1c 8.4  Passed swallow eval    Diet   Diet Heart Room service appropriate? Yes; Fluid consistency: Thin     ASA 81mg  PTA   Plan for Plavix and ASA 81mg  x 3 weeks then Plavix 75 mg alone   Therapy recommendations:  PT and ST are pending, OT no needs  Disposition:  TBD, likely home  Loop consideration per EP cardiology requested.   Hypertension . Permissive hypertension (OK if < 220/120) but gradually normalize in 5-7 days . Long-term BP goal normotensive  Hyperlipidemia  Home meds: Lipitor 40mg , continued.    LDL 119, not at goal < 70  Continue statin at discharge  Diabetes type II Uncontrolled  HgbA1c 8.4,  goal < 7.0  CBGs Recent Labs    05/28/20 0313 05/28/20 0736 05/28/20 1058  GLUCAP  89 61* 194*      SSI  Close PCP follow up Other Stroke Risk Factors  Advanced Age >/= 3   ETOH use, alcohol level <10, advised to drink no more than 2 drink(s) a day  Obesity, Body mass index is 33.47 kg/m., BMI >/= 30 associated with increased stroke risk, recommend weight loss, diet and exercise as appropriate   Hx of stroke: Previously unknown left parietal infarct found on stroke work up.   Coronary artery disease  Occasionally uses marijuana, advised to avoid.   Other [facility] day # 0  I have personally obtained history,examined this patient, reviewed notes, independently viewed imaging studies, participated in medical decision making and plan of care.ROS completed by me personally and pertinent positives fully documented  I have made any additions or clarifications directly to the above note. Agree with note above.  He presented with transient episode of aphasia with right-sided peripheral vision loss likely left hemispheric TIA.  Given the fact that he has a silent left parietal infarct strong suspicion for paroxysmal A. fib hence recommend loop recorder but this could be done as an outpatient after discharge.  Recommend aspirin and Plavix for 3 weeks followed by Plavix alone and aggressive risk factor modification.  Follow-up as an outpatient stroke clinic in 6 weeks.  Greater than 50% time during this 25-minute visit was spent in counseling and coordination of care about TIA and atrial fibrillation discussion about stroke risk and prevention and answering questions.  Discussed with Dr. Rafael Bihari, [place] Pager: [phone] 05/28/2020 3:55 PM   To contact Stroke Continuity provider, please refer to [email or web address] After hours, contact General Neurology

## 2020-05-28 NOTE — Progress Notes (Addendum)
OT Cancellation Note  Patient Details Name: [name] MRN: [id number] DOB: February 23, 1951   Cancelled Treatment:    Reason Eval/Treat Not Completed: OT screened, no needs identified, will sign off (Per PT, pt is at functional baseline. All visual deficits have resolved per PT. Will sign off.)  [place], OTR/L Acute Rehab Pager: [phone] Office: [phone] 05/28/2020, 12:42 PM

## 2020-05-28 NOTE — ED Notes (Signed)
Back from MRI.

## 2020-05-28 NOTE — Discharge Summary (Addendum)
Physician Discharge Summary  Mitchell Lambert VPX:[id number] DOB: June 29, 1950 DOA: 05/27/2020  PCP: Lawerance Cruel, MD  Admit date: 05/27/2020 Discharge date: 05/28/2020  Admitted From: Home Disposition: Home  Recommendations for Outpatient Follow-up:  1. Follow up with PCP in 1 week with repeat CBC/BMP 2. Outpatient followup with electrophysiology for loop recorder placement 3. Outpatient follow-up with neurology 4. Follow up in ED if symptoms worsen or new appear   Home Health: No Equipment/Devices: None Discharge Condition: Stable CODE STATUS: Full Diet recommendation: Heart healthy/carb modified  Brief/Interim Summary: 70 y.o. male with medical history significant for CAD s/p CABG, hypertension, asthma, insulin type 2 diabetes, CKD 3, hyperlipidemia and remote right eye retinal detachment who presented with acute onset aphasia and right eye vision change to [employer] and transferred here to [facility] per neurology recommendations.  Symptoms were rapidly improving so TPA was not given.  On presentation, creatinine was 1.8 from baseline of 1.5-1.6 and blood pressure was 200/104.  CT head showed ill-defined hypoattenuation of the left parietal white matter.  CTA head and neck showed no LVO but had severe stenosis of the proximal right P2 segment.  MRI of the brain showed no acute abnormality.  His symptoms have much improved and almost resolved.  Patient will be discharged home today after echo is done today.  Patient will be evaluated by EP as an outpatient for loop recorder placement.  Neurology has cleared the patient for discharge.  He has tolerated physical therapy.  Discharge Diagnoses:   Probable TIA -Patient presented with acute aphasia and right vision change. -CT head showed ill-defined hypoattenuation of the left parietal white matter.  CTA head and neck showed no LVO but had severe stenosis of the proximal right P2 segment.  MRI of the brain showed no acute abnormality.   His symptoms have much improved and almost resolved.   -Patient will be discharged home today after echo is done today. Patient will be evaluated by EP as an outpatient for loop recorder placement. Neurology has cleared the patient for discharge.  He has tolerated physical therapy. -Neurology recommends aspirin 81 mg along with Plavix 75 mg daily for 3 weeks then Plavix alone.  Outpatient follow-up with neurology  Hyperlipidemia -Continue Lipitor.  LDL 119  Diabetes mellitus type II with hypoglycemia and hyperglycemia -A1c 8.4.  Carb modified diet.  Resume home regimen upon discharge.  Outpatient follow-up  Acute kidney injury on chronic kidney disease stage IIIb -Presented with creatinine of 1.88.  Creatinine 1.8 today.  Outpatient follow-up  Hypertension -Presented with extremely high blood pressure.  Blood pressure improving.  Resume home antihypertensive regimen  History of CAD status post CABG -No chest pain.  Continue home regimen.  Outpatient follow-up with cardiology  Obesity -Outpatient follow-up   Discharge Instructions   Allergies as of 05/28/2020      Reactions   Gabapentin Other (See Comments)   "made me so dizzy I missed work for 2 days"   Metformin And Related Shortness Of Breath   Sulfa Antibiotics Shortness Of Breath, Rash   Azithromycin Other (See Comments)   not good for heart condition   Ciprofloxacin Other (See Comments)   tendon rupture   Lisinopril Cough   Losartan Potassium Other (See Comments)   hypotension   Olmesartan Cough   Amoxicillin Rash, Other (See Comments)   rash Did it involve swelling of the face/tongue/throat, SOB, or low BP? N Did it involve sudden or severe rash/hives, skin peeling, or any reaction on  the inside of your mouth or nose?  Y Did you need to seek medical attention at a hospital or doctor's office? N When did it last happen?"years and years ago" If all above answers are "NO", may proceed with cephalosporin use.    Doxycycline Rash   Penicillins Rash      Medication List    STOP taking these medications   Praluent 150 MG/ML Soaj Generic drug: Alirocumab     TAKE these medications   Accu-Chek Guide test strip Generic drug: glucose blood USE TO TEST BLOOD SUGAR 2 TO 3 TIMES A DAY   allopurinol 300 MG tablet Commonly known as: ZYLOPRIM Take 300 mg by mouth daily.   aspirin EC 81 MG tablet Take 1 tablet (81 mg total) by mouth every morning. Continue for 3 more weeks then discontinue (as per Neurology) What changed: additional instructions   atorvastatin 40 MG tablet Commonly known as: LIPITOR Take 40 mg by mouth daily.   B-D ULTRAFINE III SHORT PEN 31G X 8 MM Misc Generic drug: Insulin Pen Needle USE UTD WITH FLEXPEN.   Basaglar KwikPen 100 UNIT/ML Inject 100 Units into the skin in the morning. In AM   Biotin 5000 MCG Tabs Take 5,000 mcg by mouth daily.   bisoprolol 5 MG tablet Commonly known as: ZEBETA Take 5 mg by mouth daily.   clopidogrel 75 MG tablet Commonly known as: PLAVIX Take 1 tablet (75 mg total) by mouth daily. Start taking on: May 29, 2020   fish oil-omega-3 fatty acids 1000 MG capsule Take 1 g by mouth 2 (two) times daily.   glucosamine-chondroitin 500-400 MG tablet Take 2 tablets by mouth daily.   Ipratropium-Albuterol 20-100 MCG/ACT Aers respimat Commonly known as: COMBIVENT Inhale 1 puff into the lungs every 6 (six) hours as needed for wheezing.   L-Lysine HCl 1000 MG Tabs Take 1 tablet by mouth daily.   LORazepam 0.5 MG tablet Commonly known as: ATIVAN Take 0.5 mg by mouth daily as needed for anxiety.   magnesium oxide 400 MG tablet Commonly known as: MAG-OX Take 400 mg by mouth 2 (two) times daily.   nitroGLYCERIN 0.4 MG SL tablet Commonly known as: NITROSTAT Place 0.4 mg under the tongue as needed for chest pain.   NovoLOG FlexPen 100 UNIT/ML FlexPen Generic drug: insulin aspart Inject 10-15 Units into the skin as needed for high blood  sugar.   olmesartan 10 mg Tabs tablet Commonly known as: BENICAR Take 10 mg by mouth daily.   omeprazole 20 MG capsule Commonly known as: PRILOSEC Take 20 mg by mouth as needed (acid reflux).   tamsulosin 0.4 MG Caps capsule Commonly known as: FLOMAX Take 0.4 mg by mouth as needed ("for kidney stone issues").   traMADol 50 MG tablet Commonly known as: ULTRAM Take 50 mg by mouth as needed for moderate pain.   Ventolin HFA 108 (90 Base) MCG/ACT inhaler Generic drug: albuterol Inhale 2 puffs into the lungs as needed for wheezing or shortness of breath.   vitamin C 500 MG tablet Commonly known as: ASCORBIC ACID Take 500 mg by mouth daily.   Vitamin D3 125 MCG (5000 UT) Tabs Take 5,000 Units by mouth daily.   VITAMIN E PO Take 1 capsule by mouth daily.        Follow-up Information    Lawerance Cruel, MD. Schedule an appointment as soon as possible for a visit in 1 week(s).   Specialty: Family Medicine Why: Elevated CBC/BMP Contact information: [address]  [place] [address] [phone]        Lorretta Harp, MD .   Specialties: Cardiology, Radiology Contact information: [address] [place] [address] [place] [address] [phone]              Allergies  Allergen Reactions  . Gabapentin Other (See Comments)    "made me so dizzy I missed work for 2 days"  . Metformin And Related Shortness Of Breath  . Sulfa Antibiotics Shortness Of Breath and Rash  . Amoxicillin     rash Did it involve swelling of the face/tongue/throat, SOB, or low BP? N Did it involve sudden or severe rash/hives, skin peeling, or any reaction on the inside of your mouth or nose?  Y Did you need to seek medical attention at a hospital or doctor's office? N When did it last happen?"years and years ago" If all above answers are "NO", may proceed with cephalosporin use.   Marland Kitchen Lisinopril     Cough   . Doxycycline Rash    Consultations:  Neurology.  EP  evaluation will be done as an outpatient   Procedures/Studies: CT ANGIO HEAD W OR WO CONTRAST  Result Date: 05/27/2020 CLINICAL DATA:  Expressive aphasia and confusion EXAM: CT ANGIOGRAPHY HEAD AND NECK CT PERFUSION BRAIN TECHNIQUE: Multidetector CT imaging of the head and neck was performed using the standard protocol during bolus administration of intravenous contrast. Multiplanar CT image reconstructions and MIPs were obtained to evaluate the vascular anatomy. Carotid stenosis measurements (when applicable) are obtained utilizing NASCET criteria, using the distal internal carotid diameter as the denominator. Multiphase CT imaging of the brain was performed following IV bolus contrast injection. Subsequent parametric perfusion maps were calculated using RAPID software. CONTRAST:  176mL OMNIPAQUE IOHEXOL 350 MG/ML SOLN COMPARISON:  None. FINDINGS: CTA NECK FINDINGS SKELETON: There is no bony spinal canal stenosis. No lytic or blastic lesion. OTHER NECK: Normal pharynx, larynx and major salivary glands. No cervical lymphadenopathy. Unremarkable thyroid gland. UPPER CHEST: No pneumothorax or pleural effusion. No nodules or masses. AORTIC ARCH: There is calcific atherosclerosis of the aortic arch. There is no aneurysm, dissection or hemodynamically significant stenosis of the visualized portion of the aorta. Conventional 3 vessel aortic branching pattern. The visualized proximal subclavian arteries are widely patent. RIGHT CAROTID SYSTEM: Normal without aneurysm, dissection or stenosis. LEFT CAROTID SYSTEM: Normal without aneurysm, dissection or stenosis. VERTEBRAL ARTERIES: Right dominant configuration. Poor visualization of left origin. There is no dissection, occlusion or flow-limiting stenosis to the skull base (V1-V3 segments). CTA HEAD FINDINGS POSTERIOR CIRCULATION: --Vertebral arteries: Moderate atherosclerotic calcification of the right V4 segment. Left V4 terminates in PICA. --Inferior cerebellar  arteries: Normal. --Basilar artery: Normal. --Superior cerebellar arteries: Normal. --Posterior cerebral arteries (PCA): There is severe stenosis/short segment occlusion of the proximal right P2 segment. The distal right PCA is patent. ANTERIOR CIRCULATION: --Intracranial internal carotid arteries: Normal. --Anterior cerebral arteries (ACA): Normal. Both A1 segments are present. Patent anterior communicating artery (a-comm). --Middle cerebral arteries (MCA): Normal. VENOUS SINUSES: As permitted by contrast timing, patent. ANATOMIC VARIANTS: None Review of the MIP images confirms the above findings. CT Brain Perfusion Findings: Technically inadequate CT perfusion study. IMPRESSION: 1. No emergent large vessel occlusion. 2. Severe stenosis/short segment occlusion of the proximal right P2 segment. 3. Technically inadequate CT perfusion study. Aortic Atherosclerosis (ICD10-I70.0). Electronically Signed   By: Ulyses Jarred M.D.   On: 05/27/2020 20:42   CT ANGIO NECK W OR WO CONTRAST  Result Date: 05/27/2020 CLINICAL DATA:  Expressive  aphasia and confusion EXAM: CT ANGIOGRAPHY HEAD AND NECK CT PERFUSION BRAIN TECHNIQUE: Multidetector CT imaging of the head and neck was performed using the standard protocol during bolus administration of intravenous contrast. Multiplanar CT image reconstructions and MIPs were obtained to evaluate the vascular anatomy. Carotid stenosis measurements (when applicable) are obtained utilizing NASCET criteria, using the distal internal carotid diameter as the denominator. Multiphase CT imaging of the brain was performed following IV bolus contrast injection. Subsequent parametric perfusion maps were calculated using RAPID software. CONTRAST:  163mL OMNIPAQUE IOHEXOL 350 MG/ML SOLN COMPARISON:  None. FINDINGS: CTA NECK FINDINGS SKELETON: There is no bony spinal canal stenosis. No lytic or blastic lesion. OTHER NECK: Normal pharynx, larynx and major salivary glands. No cervical  lymphadenopathy. Unremarkable thyroid gland. UPPER CHEST: No pneumothorax or pleural effusion. No nodules or masses. AORTIC ARCH: There is calcific atherosclerosis of the aortic arch. There is no aneurysm, dissection or hemodynamically significant stenosis of the visualized portion of the aorta. Conventional 3 vessel aortic branching pattern. The visualized proximal subclavian arteries are widely patent. RIGHT CAROTID SYSTEM: Normal without aneurysm, dissection or stenosis. LEFT CAROTID SYSTEM: Normal without aneurysm, dissection or stenosis. VERTEBRAL ARTERIES: Right dominant configuration. Poor visualization of left origin. There is no dissection, occlusion or flow-limiting stenosis to the skull base (V1-V3 segments). CTA HEAD FINDINGS POSTERIOR CIRCULATION: --Vertebral arteries: Moderate atherosclerotic calcification of the right V4 segment. Left V4 terminates in PICA. --Inferior cerebellar arteries: Normal. --Basilar artery: Normal. --Superior cerebellar arteries: Normal. --Posterior cerebral arteries (PCA): There is severe stenosis/short segment occlusion of the proximal right P2 segment. The distal right PCA is patent. ANTERIOR CIRCULATION: --Intracranial internal carotid arteries: Normal. --Anterior cerebral arteries (ACA): Normal. Both A1 segments are present. Patent anterior communicating artery (a-comm). --Middle cerebral arteries (MCA): Normal. VENOUS SINUSES: As permitted by contrast timing, patent. ANATOMIC VARIANTS: None Review of the MIP images confirms the above findings. CT Brain Perfusion Findings: Technically inadequate CT perfusion study. IMPRESSION: 1. No emergent large vessel occlusion. 2. Severe stenosis/short segment occlusion of the proximal right P2 segment. 3. Technically inadequate CT perfusion study. Aortic Atherosclerosis (ICD10-I70.0). Electronically Signed   By: Ulyses Jarred M.D.   On: 05/27/2020 20:42   MR BRAIN WO CONTRAST  Result Date: 05/28/2020 CLINICAL DATA:  Aphasia EXAM:  MRI HEAD WITHOUT CONTRAST TECHNIQUE: Multiplanar, multiecho pulse sequences of the brain and surrounding structures were obtained without intravenous contrast. COMPARISON:  None. FINDINGS: Brain: No acute infarct, mass effect or extra-axial collection. No acute or chronic hemorrhage. Old left parietal infarct. White matter signal otherwise normal. Normal volume of CSF spaces. Incidentally noted cavum septum pellucidum et vergae. Vascular: Major flow voids are preserved. Skull and upper cervical spine: Normal calvarium and skull base. Visualized upper cervical spine and soft tissues are normal. Sinuses/Orbits:No paranasal sinus fluid levels or advanced mucosal thickening. No mastoid or middle ear effusion. Normal orbits. IMPRESSION: 1. No acute intracranial abnormality. 2. Old left parietal infarct. Electronically Signed   By: Ulyses Jarred M.D.   On: 05/28/2020 00:20   CT CEREBRAL PERFUSION W CONTRAST  Result Date: 05/27/2020 CLINICAL DATA:  Expressive aphasia and confusion EXAM: CT ANGIOGRAPHY HEAD AND NECK CT PERFUSION BRAIN TECHNIQUE: Multidetector CT imaging of the head and neck was performed using the standard protocol during bolus administration of intravenous contrast. Multiplanar CT image reconstructions and MIPs were obtained to evaluate the vascular anatomy. Carotid stenosis measurements (when applicable) are obtained utilizing NASCET criteria, using the distal internal carotid diameter as the denominator. Multiphase CT imaging of  the brain was performed following IV bolus contrast injection. Subsequent parametric perfusion maps were calculated using RAPID software. CONTRAST:  156mL OMNIPAQUE IOHEXOL 350 MG/ML SOLN COMPARISON:  None. FINDINGS: CTA NECK FINDINGS SKELETON: There is no bony spinal canal stenosis. No lytic or blastic lesion. OTHER NECK: Normal pharynx, larynx and major salivary glands. No cervical lymphadenopathy. Unremarkable thyroid gland. UPPER CHEST: No pneumothorax or pleural  effusion. No nodules or masses. AORTIC ARCH: There is calcific atherosclerosis of the aortic arch. There is no aneurysm, dissection or hemodynamically significant stenosis of the visualized portion of the aorta. Conventional 3 vessel aortic branching pattern. The visualized proximal subclavian arteries are widely patent. RIGHT CAROTID SYSTEM: Normal without aneurysm, dissection or stenosis. LEFT CAROTID SYSTEM: Normal without aneurysm, dissection or stenosis. VERTEBRAL ARTERIES: Right dominant configuration. Poor visualization of left origin. There is no dissection, occlusion or flow-limiting stenosis to the skull base (V1-V3 segments). CTA HEAD FINDINGS POSTERIOR CIRCULATION: --Vertebral arteries: Moderate atherosclerotic calcification of the right V4 segment. Left V4 terminates in PICA. --Inferior cerebellar arteries: Normal. --Basilar artery: Normal. --Superior cerebellar arteries: Normal. --Posterior cerebral arteries (PCA): There is severe stenosis/short segment occlusion of the proximal right P2 segment. The distal right PCA is patent. ANTERIOR CIRCULATION: --Intracranial internal carotid arteries: Normal. --Anterior cerebral arteries (ACA): Normal. Both A1 segments are present. Patent anterior communicating artery (a-comm). --Middle cerebral arteries (MCA): Normal. VENOUS SINUSES: As permitted by contrast timing, patent. ANATOMIC VARIANTS: None Review of the MIP images confirms the above findings. CT Brain Perfusion Findings: Technically inadequate CT perfusion study. IMPRESSION: 1. No emergent large vessel occlusion. 2. Severe stenosis/short segment occlusion of the proximal right P2 segment. 3. Technically inadequate CT perfusion study. Aortic Atherosclerosis (ICD10-I70.0). Electronically Signed   By: Ulyses Jarred M.D.   On: 05/27/2020 20:42   CT HEAD CODE STROKE WO CONTRAST  Result Date: 05/27/2020 CLINICAL DATA:  Code stroke.  Aphasia. EXAM: CT HEAD WITHOUT CONTRAST TECHNIQUE: Contiguous axial images  were obtained from the base of the skull through the vertex without intravenous contrast. COMPARISON:  None. FINDINGS: Brain: No evidence of acute large vascular territory infarct. No acute hemorrhage. No mass lesion or abnormal mass effect. No hydrocephalus. Cavum septum pellucidum et vergae, anatomic variant. Vascular: Calcific atherosclerosis.  No hyperdense vessel. Skull: No acute fracture. Small bony hyperostosis along the right frontal calvarium. Sinuses/Orbits: Small right maxillary sinus with mucosal thickening, possibly the sequela of chronic sinusitis. Otherwise, sinuses are largely clear. Unremarkable orbits. Other: No mastoid effusions. IMPRESSION: Ill-defined hypoattenuation involving the left parietal white matter with slight loss of gray-white differentiation, concerning for age indeterminate infarct. Given the patient's symptoms and absence of priors for comparison, recommend MRI to further evaluate. Code stroke imaging results were communicated on 05/27/2020 at 7:01 pm to provider Dr. Vanita Panda Via telephone, who verbally acknowledged these results. Electronically Signed   By: Margaretha Sheffield MD   On: 05/27/2020 19:07    Echo    Subjective: Patient seen and examined at bedside.  He feels that his symptoms have much improved and almost back to his baseline.  No overnight fever, vomiting reported.  Discharge Exam: Vitals:   05/28/20 0615 05/28/20 0915  BP: (!) 172/79 (!) 151/78  Pulse: (!) 55 66  Resp: 19 19  Temp:    SpO2: 97% 97%    General: Pt is alert, awake, not in acute distress Cardiovascular: rate controlled, S1/S2 + Respiratory: bilateral decreased breath sounds at bases Abdominal: Soft, NT, ND, bowel sounds + Extremities: no edema, no cyanosis  The results of significant diagnostics from this hospitalization (including imaging, microbiology, ancillary and laboratory) are listed below for reference.     Microbiology: Recent Results (from the past 240 hour(s))   Resp Panel by RT-PCR (Flu A&B, Covid) Nasopharyngeal Swab     Status: None   Collection Time: 05/27/20  9:33 PM   Specimen: Nasopharyngeal Swab; Nasopharyngeal(NP) swabs in vial transport medium  Result Value Ref Range Status   SARS Coronavirus 2 by RT PCR NEGATIVE NEGATIVE Final    Comment: (NOTE) SARS-CoV-2 target nucleic acids are NOT DETECTED.  The SARS-CoV-2 RNA is generally detectable in upper respiratory specimens during the acute phase of infection. The lowest concentration of SARS-CoV-2 viral copies this assay can detect is 138 copies/mL. A negative result does not preclude SARS-Cov-2 infection and should not be used as the sole basis for treatment or other patient management decisions. A negative result may occur with  improper specimen collection/handling, submission of specimen other than nasopharyngeal swab, presence of viral mutation(s) within the areas targeted by this assay, and inadequate number of viral copies(<138 copies/mL). A negative result must be combined with clinical observations, patient history, and epidemiological information. The expected result is Negative.  Fact Sheet for Patients:  EntrepreneurPulse.com.au  Fact Sheet for Healthcare Providers:  IncredibleEmployment.be  This test is no t yet approved or cleared by the [place] FDA and  has been authorized for detection and/or diagnosis of SARS-CoV-2 by FDA under an Emergency Use Authorization (EUA). This EUA will remain  in effect (meaning this test can be used) for the duration of the COVID-19 declaration under Section 564(b)(1) of the Act, 21 U.S.C.section 360bbb-3(b)(1), unless the authorization is terminated  or revoked sooner.       Influenza A by PCR NEGATIVE NEGATIVE Final   Influenza B by PCR NEGATIVE NEGATIVE Final    Comment: (NOTE) The Xpert Xpress SARS-CoV-2/FLU/RSV plus assay is intended as an aid in the diagnosis of influenza from  Nasopharyngeal swab specimens and should not be used as a sole basis for treatment. Nasal washings and aspirates are unacceptable for Xpert Xpress SARS-CoV-2/FLU/RSV testing.  Fact Sheet for Patients: EntrepreneurPulse.com.au  Fact Sheet for Healthcare Providers: IncredibleEmployment.be  This test is not yet approved or cleared by the [place] FDA and has been authorized for detection and/or diagnosis of SARS-CoV-2 by FDA under an Emergency Use Authorization (EUA). This EUA will remain in effect (meaning this test can be used) for the duration of the COVID-19 declaration under Section 564(b)(1) of the Act, 21 U.S.C. section 360bbb-3(b)(1), unless the authorization is terminated or revoked.  Performed at [facility], [place] [address], [place],  [address]      Labs: BNP (last 3 results) No results for input(s): BNP in the last 8760 hours. Basic Metabolic Panel: Recent Labs  Lab 05/27/20 1905 05/27/20 1915  NA 139 142  K 4.4 4.4  CL 108 107  CO2 23  --   GLUCOSE 244* 240*  BUN 35* 34*  CREATININE 1.88* 1.80*  CALCIUM 8.6*  --    Liver Function Tests: Recent Labs  Lab 05/27/20 1905  AST 19  ALT 19  ALKPHOS 135*  BILITOT 0.8  PROT 5.8*  ALBUMIN 2.8*   No results for input(s): LIPASE, AMYLASE in the last 168 hours. No results for input(s): AMMONIA in the last 168 hours. CBC: Recent Labs  Lab 05/27/20 1905 05/27/20 1915  WBC 6.5  --   NEUTROABS 4.2  --   HGB 14.3  13.6  HCT 41.7 40.0  MCV 92.5  --   PLT 195  --    Cardiac Enzymes: No results for input(s): CKTOTAL, CKMB, CKMBINDEX, TROPONINI in the last 168 hours. BNP: Invalid input(s): POCBNP CBG: Recent Labs  Lab 05/28/20 0313 05/28/20 0736 05/28/20 1058  GLUCAP 75 61* 194*   D-Dimer No results for input(s): DDIMER in the last 72 hours. Hgb A1c Recent Labs    05/28/20 0336  HGBA1C 8.4*   Lipid Profile Recent Labs    05/28/20 0336   CHOL 176  HDL 34*  LDLCALC 119*  TRIG 115  CHOLHDL 5.2   Thyroid function studies No results for input(s): TSH, T4TOTAL, T3FREE, THYROIDAB in the last 72 hours.  Invalid input(s): FREET3 Anemia work up No results for input(s): VITAMINB12, FOLATE, FERRITIN, TIBC, IRON, RETICCTPCT in the last 72 hours. Urinalysis    Component Value Date/Time   COLORURINE STRAW (A) 05/27/2020 2133   APPEARANCEUR CLEAR 05/27/2020 2133   LABSPEC 1.035 (H) 05/27/2020 2133   PHURINE 5.0 05/27/2020 2133   GLUCOSEU 150 (A) 05/27/2020 2133   HGBUR NEGATIVE 05/27/2020 2133   BILIRUBINUR NEGATIVE 05/27/2020 2133   BILIRUBINUR negative 03/31/2015 1723   BILIRUBINUR neg 08/16/2012 0912   KETONESUR NEGATIVE 05/27/2020 2133   PROTEINUR >=300 (A) 05/27/2020 2133   UROBILINOGEN 0.2 03/31/2015 1723   UROBILINOGEN 0.2 04/29/2011 0004   NITRITE NEGATIVE 05/27/2020 2133   LEUKOCYTESUR NEGATIVE 05/27/2020 2133   Sepsis Labs Invalid input(s): PROCALCITONIN,  WBC,  LACTICIDVEN Microbiology Recent Results (from the past 240 hour(s))  Resp Panel by RT-PCR (Flu A&B, Covid) Nasopharyngeal Swab     Status: None   Collection Time: 05/27/20  9:33 PM   Specimen: Nasopharyngeal Swab; Nasopharyngeal(NP) swabs in vial transport medium  Result Value Ref Range Status   SARS Coronavirus 2 by RT PCR NEGATIVE NEGATIVE Final    Comment: (NOTE) SARS-CoV-2 target nucleic acids are NOT DETECTED.  The SARS-CoV-2 RNA is generally detectable in upper respiratory specimens during the acute phase of infection. The lowest concentration of SARS-CoV-2 viral copies this assay can detect is 138 copies/mL. A negative result does not preclude SARS-Cov-2 infection and should not be used as the sole basis for treatment or other patient management decisions. A negative result may occur with  improper specimen collection/handling, submission of specimen other than nasopharyngeal swab, presence of viral mutation(s) within the areas targeted  by this assay, and inadequate number of viral copies(<138 copies/mL). A negative result must be combined with clinical observations, patient history, and epidemiological information. The expected result is Negative.  Fact Sheet for Patients:  EntrepreneurPulse.com.au  Fact Sheet for Healthcare Providers:  IncredibleEmployment.be  This test is no t yet approved or cleared by the [place] FDA and  has been authorized for detection and/or diagnosis of SARS-CoV-2 by FDA under an Emergency Use Authorization (EUA). This EUA will remain  in effect (meaning this test can be used) for the duration of the COVID-19 declaration under Section 564(b)(1) of the Act, 21 U.S.C.section 360bbb-3(b)(1), unless the authorization is terminated  or revoked sooner.       Influenza A by PCR NEGATIVE NEGATIVE Final   Influenza B by PCR NEGATIVE NEGATIVE Final    Comment: (NOTE) The Xpert Xpress SARS-CoV-2/FLU/RSV plus assay is intended as an aid in the diagnosis of influenza from Nasopharyngeal swab specimens and should not be used as a sole basis for treatment. Nasal washings and aspirates are unacceptable for Xpert Xpress SARS-CoV-2/FLU/RSV testing.  Fact Sheet for  Patients: EntrepreneurPulse.com.au  Fact Sheet for Healthcare Providers: IncredibleEmployment.be  This test is not yet approved or cleared by the [place] FDA and has been authorized for detection and/or diagnosis of SARS-CoV-2 by FDA under an Emergency Use Authorization (EUA). This EUA will remain in effect (meaning this test can be used) for the duration of the COVID-19 declaration under Section 564(b)(1) of the Act, 21 U.S.C. section 360bbb-3(b)(1), unless the authorization is terminated or revoked.  Performed at [facility], [place] [address] [place], [place] [address]      Time coordinating discharge: 35 minutes  SIGNED:   Aline August, MD  [facility] 05/28/2020, 11:44 AM

## 2020-05-28 NOTE — ED Notes (Signed)
Given patient Mitchell Lambert due to glucose 75 after speaking with Dr Myna Hidalgo about not giving insulin due to lower blood sugar

## 2020-06-04 ENCOUNTER — Encounter: Payer: Self-pay | Admitting: Cardiovascular Disease

## 2020-06-04 ENCOUNTER — Ambulatory Visit (INDEPENDENT_AMBULATORY_CARE_PROVIDER_SITE_OTHER): Payer: Medicare Other | Admitting: Cardiovascular Disease

## 2020-06-04 ENCOUNTER — Other Ambulatory Visit: Payer: Self-pay

## 2020-06-04 DIAGNOSIS — E1169 Type 2 diabetes mellitus with other specified complication: Secondary | ICD-10-CM | POA: Diagnosis not present

## 2020-06-04 DIAGNOSIS — E782 Mixed hyperlipidemia: Secondary | ICD-10-CM

## 2020-06-04 DIAGNOSIS — I10 Essential (primary) hypertension: Secondary | ICD-10-CM

## 2020-06-04 DIAGNOSIS — Z951 Presence of aortocoronary bypass graft: Secondary | ICD-10-CM

## 2020-06-04 DIAGNOSIS — I6300 Cerebral infarction due to thrombosis of unspecified precerebral artery: Secondary | ICD-10-CM | POA: Diagnosis not present

## 2020-06-04 DIAGNOSIS — Z09 Encounter for follow-up examination after completed treatment for conditions other than malignant neoplasm: Secondary | ICD-10-CM | POA: Diagnosis not present

## 2020-06-04 DIAGNOSIS — G459 Transient cerebral ischemic attack, unspecified: Secondary | ICD-10-CM | POA: Diagnosis not present

## 2020-06-04 MED ORDER — OLMESARTAN MEDOXOMIL 20 MG PO TABS: 20.0000 mg | ORAL_TABLET | Freq: Every day | ORAL | 3 refills | Status: DC

## 2020-06-04 NOTE — Assessment & Plan Note (Signed)
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  Transthoracic echo showed normal LV function.  I am going to schedule him to have a TEE to look for embolic source.  He is scheduled to see Dr. Quentin Ore in the office in the coming weeks for consideration of loop recorder implantation to look for A. fib.  He was placed on Plavix.

## 2020-06-04 NOTE — Assessment & Plan Note (Signed)
History of essential hypertension with blood pressure measured today 194/84.  They recently bought an Omron blood pressure cuff and blood pressure at home runs in the 151-761 range systolic.  He is on Benicar and Zebeta.  I am going to increase his Benicar dose from 10 to 20 mg a day, will have him keep a 30-day blood pressure log and see a Pharm.D. after that for review and blood pressure cuff calibration.  I would like a systolic blood pressures to be in the 130 range.  We did talk about the importance of avoiding salt.

## 2020-06-04 NOTE — Assessment & Plan Note (Signed)
History of CAD status post CABG x4 by Dr. Roxan Hockey 04/28/2011 with a LIMA to his LAD, left radial to OM1 and a vein to diagonal branch and PDA.  A subsequent Myoview stress test was nonischemic.  He denies chest pain.

## 2020-06-04 NOTE — Progress Notes (Signed)
06/04/2020 Mitchell Lambert   Dec 05, 1950  [id number]  Primary Physician Lawerance Cruel, MD Primary Cardiologist: Lorretta Harp MD Lupe Lambert, Mitchell  HPI:  Mitchell Lambert is a 70 y.o.  moderately overweight married Caucasian male father 80, grandfather of 7 grandchildren referred by Dr. Harrington Challenger to be established in my practice for ongoing cardiovascular care. He previously was taken care of by Dr. Wynonia Lawman. He works as a Health and safety inspector at [employer].  I last saw him in the office 04/18/2019.His risk factors include treated hypertension, diabetes and hyperlipidemia. There is no family history for heart disease. He is never had a heart attack or stroke. He did undergo cardiac catheterization because of chest pain Dr. Wynonia Lawman February 2013 revealing three-vessel disease with preserved LV function and underwent CABG x4 by Dr. Roxan Hockey 04/28/2011 with a LIMA to the LAD, left radial to OM1, vein to diagonal branch and PDA. He apparently had a Myoview stress test sometime after which which was negative. He gets occasional atypical chest pain once or twice a year.  He did come down with COVID-19 in November 2020 and again December 2021 and was hospitalized for 2 days the first time.   Since I saw him a year ago in addition he was hospitalized within the last month with a TIA.  His symptoms were of transient aphasia and visual disturbance.  He did have an MRA that showed a M2 segment stenosis.  The etiology of his stroke was never determined.  He was discharged home on Plavix and aspirin.  He is scheduled to see Dr. Quentin Ore in the office in several weeks for consideration of loop recorder implantation.  2D echo did not reveal embolic source but this was a TTE.  His blood pressures at home have remained elevated.  Current Meds  Medication Sig  . ACCU-CHEK GUIDE test strip USE TO TEST BLOOD SUGAR 2 TO 3 TIMES A DAY  . allopurinol (ZYLOPRIM) 300 MG tablet Take 300 mg by mouth daily.   Marland Kitchen aspirin EC 81 MG tablet Take 1 tablet (81 mg total) by mouth every morning. Continue for 3 more weeks then discontinue (as per Neurology)  . atorvastatin (LIPITOR) 40 MG tablet Take 40 mg by mouth daily.  . B-D ULTRAFINE III SHORT PEN 31G X 8 MM MISC USE UTD WITH FLEXPEN.  . Biotin 5000 MCG TABS Take 5,000 mcg by mouth daily.   . bisoprolol (ZEBETA) 10 MG tablet Take 10 mg by mouth daily.  . Cholecalciferol (VITAMIN D3) 5000 UNITS TABS Take 5,000 Units by mouth daily.   . clopidogrel (PLAVIX) 75 MG tablet Take 1 tablet (75 mg total) by mouth daily.  . fish oil-omega-3 fatty acids 1000 MG capsule Take 1 g by mouth 2 (two) times daily.  Marland Kitchen glucosamine-chondroitin 500-400 MG tablet Take 2 tablets by mouth daily.  . insulin aspart (NOVOLOG FLEXPEN) 100 UNIT/ML FlexPen Inject 10-15 Units into the skin as needed for high blood sugar.  . Insulin Glargine (BASAGLAR KWIKPEN) 100 UNIT/ML SOPN Inject 100 Units into the skin in the morning. In AM  . Ipratropium-Albuterol (COMBIVENT) 20-100 MCG/ACT AERS respimat Inhale 1 puff into the lungs every 6 (six) hours as needed for wheezing.  Marland Kitchen L-Lysine HCl 1000 MG TABS Take 1 tablet by mouth daily.  Marland Kitchen LORazepam (ATIVAN) 0.5 MG tablet Take 0.5 mg by mouth daily as needed for anxiety.  . magnesium oxide (MAG-OX) 400 MG tablet Take 400 mg by mouth 2 (two)  times daily.   . nitroGLYCERIN (NITROSTAT) 0.4 MG SL tablet Place 0.4 mg under the tongue as needed for chest pain.  Marland Kitchen omeprazole (PRILOSEC) 20 MG capsule Take 20 mg by mouth as needed (acid reflux).  . tamsulosin (FLOMAX) 0.4 MG CAPS capsule Take 0.4 mg by mouth as needed ("for kidney stone issues").  . traMADol (ULTRAM) 50 MG tablet Take 50 mg by mouth as needed for moderate pain.  . VENTOLIN HFA 108 (90 Base) MCG/ACT inhaler Inhale 2 puffs into the lungs as needed for wheezing or shortness of breath.  . vitamin C (ASCORBIC ACID) 500 MG tablet Take 500 mg by mouth daily.   Marland Kitchen VITAMIN E PO Take 1 capsule by mouth  daily.  . [DISCONTINUED] irbesartan (AVAPRO) 300 MG tablet Take 300 mg by mouth daily.     Allergies  Allergen Reactions  . Gabapentin Other (See Comments)    "made me so dizzy I missed work for 2 days"  . Metformin And Related Shortness Of Breath  . Sulfa Antibiotics Shortness Of Breath and Rash  . Azithromycin Other (See Comments)    not good for heart condition  . Ciprofloxacin Other (See Comments)    tendon rupture  . Lisinopril Cough  . Losartan Potassium Other (See Comments)    hypotension  . Olmesartan Cough  . Amoxicillin Rash and Other (See Comments)    rash Did it involve swelling of the face/tongue/throat, SOB, or low BP? N Did it involve sudden or severe rash/hives, skin peeling, or any reaction on the inside of your mouth or nose?  Y Did you need to seek medical attention at a hospital or doctor's office? N When did it last happen?"years and years ago" If all above answers are "NO", may proceed with cephalosporin use.   Marland Kitchen Doxycycline Rash  . Penicillins Rash    Social History   Socioeconomic History  . Marital status: Married    Spouse name: Not on file  . Number of children: Not on file  . Years of education: Not on file  . Highest education level: Not on file  Occupational History  . Not on file  Tobacco Use  . Smoking status: Never Smoker  . Smokeless tobacco: Never Used  Vaping Use  . Vaping Use: Never used  Substance and Sexual Activity  . Alcohol use: Yes    Comment:  OCCAS BEER  OR MIXED DRINK LAST MARIJUANA COUPLE MONTHS AGO  . Drug use: Yes    Types: Marijuana    Comment: last marijuana use ~ 2010; "very rare". since  . Sexual activity: Yes  Other Topics Concern  . 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         Social Determinants of Health   Financial Resource Strain: Not on file  Food Insecurity: Not on file  Transportation Needs: Not on file  Physical  Activity: Not on file  Stress: Not on file  Social Connections: Not on file  Intimate Partner Violence: Not on file     Review of Systems: General: negative for chills, fever, night sweats or weight changes.  Cardiovascular: negative for chest pain, dyspnea on exertion, edema, orthopnea, palpitations, paroxysmal nocturnal dyspnea or shortness of breath Dermatological: negative for rash Respiratory: negative for cough or wheezing Urologic: negative for hematuria Abdominal: negative for nausea, vomiting, diarrhea, bright red blood per rectum, melena, or hematemesis Neurologic: negative for visual changes, syncope, or  dizziness All other systems reviewed and are otherwise negative except as noted above.    Blood pressure (!) 194/84, pulse 60, height 6' (1.829 m), weight 243 lb 3.2 oz (110.3 kg), SpO2 97 %.  General appearance: alert and no distress Neck: no adenopathy, no carotid bruit, no JVD, supple, symmetrical, trachea midline and thyroid not enlarged, symmetric, no tenderness/mass/nodules Lungs: clear to auscultation bilaterally Heart: regular rate and rhythm, S1, S2 normal, no murmur, click, rub or gallop Extremities: extremities normal, atraumatic, no cyanosis or edema Pulses: 2+ and symmetric Skin: Skin color, texture, turgor normal. No rashes or lesions Neurologic: Alert and oriented X 3, normal strength and tone. Normal symmetric reflexes. Normal coordination and gait  EKG not performed today  ASSESSMENT AND PLAN:   Hyperlipidemia History of hyperlipidemia on atorvastatin 40 mg a day with lipid profile performed 05/28/2020 revealing total cholesterol 176, LDL of 119 and HDL of 34.  We did speak about Repatha at his last office visit however because of the co-pay it was financially not feasible.  He is not at goal for secondary prevention.  We will get our Pharm.D.'s to further evaluate.  Hypertension History of essential hypertension with blood pressure measured today  194/84.  They recently bought an Omron blood pressure cuff and blood pressure at home runs in the 701-779 range systolic.  He is on Benicar and Zebeta.  I am going to increase his Benicar dose from 10 to 20 mg a day, will have him keep a 30-day blood pressure log and see a Pharm.D. after that for review and blood pressure cuff calibration.  I would like a systolic blood pressures to be in the 130 range.  We did talk about the importance of avoiding salt.  S/P CABG (coronary artery bypass graft) History of CAD status post CABG x4 by Dr. Roxan Hockey 04/28/2011 with a LIMA to his LAD, left radial to OM1 and a vein to diagonal branch and PDA.  A subsequent Myoview stress test was nonischemic.  He denies chest pain.  Stroke [facility]) Recent cryptogenic stroke 05/28/2020 at the end of March.  Transthoracic echo showed normal LV function.  I am going to schedule him to have a TEE to look for embolic source.  He is scheduled to see Dr. Quentin Ore in the office in the coming weeks for consideration of loop recorder implantation to look for A. fib.  He was placed on Plavix.      Lorretta Harp MD FACP,FACC,FAHA, [facility] 06/04/2020 8:57 AM

## 2020-06-04 NOTE — Assessment & Plan Note (Signed)
History of hyperlipidemia on atorvastatin 40 mg a day with lipid profile performed 05/28/2020 revealing total cholesterol 176, LDL of 119 and HDL of 34.  We did speak about Repatha at his last office visit however because of the co-pay it was financially not feasible.  He is not at goal for secondary prevention.  We will get our Pharm.D.'s to further evaluate.

## 2020-06-04 NOTE — Patient Instructions (Addendum)
Medication Instructions:   -Increase Olmesartan 20mg  once daily.  *If you need a refill on your cardiac medications before your next appointment, please call your pharmacy*   Lab Work: Your physician recommends that you return for lab work in: With 7 days of TEE (06/17/20)  If you have labs (blood work) drawn today and your tests are completely normal, you will receive your results only by: Marland Kitchen MyChart Message (if you have MyChart) OR . A paper copy in the mail If you have any lab test that is abnormal or we need to change your treatment, we will call you to review the results.   Testing/Procedures: Dear Mr. Daily Crate are scheduled for a TEE on Monday 06/17/20 with Dr. Stanford Breed.  Please arrive at the [facility] (Main Entrance A) at [facility]: [address] [place], [place] [address] at 6:30 am. (1 hour prior to procedure unless lab work is needed; if lab work is needed arrive 1.5 hours ahead)  DIET: Nothing to eat or drink after midnight except a sip of water with medications (see medication instructions below)  Medication Instructions: Hold all insulin on day of procedure.  Only take 1/2 insulin night before procedure.    You must have a responsible person to drive you home and stay in the waiting area during your procedure. Failure to do so could result in cancellation.  Bring your insurance cards.  *Special Note: Every effort is made to have your procedure done on time. Occasionally there are emergencies that occur at the hospital that may cause delays. Please be patient if a delay does occur.     Follow-Up: At [facility], you and your health needs are our priority.  As part of our continuing mission to provide you with exceptional heart care, we have created designated Provider Care Teams.  These Care Teams include your primary Cardiologist (physician) and Advanced Practice Providers (APPs -  Physician Assistants and Nurse Practitioners) who all work together to  provide you with the care you need, when you need it.  We recommend signing up for the patient portal called "MyChart".  Sign up information is provided on this After Visit Summary.  MyChart is used to connect with patients for Virtual Visits (Telemedicine).  Patients are able to view lab/test results, encounter notes, upcoming appointments, etc.  Non-urgent messages can be sent to your provider as well.   To learn more about what you can do with MyChart, go to [email or web address].    Your next appointment:   6 month(s)  The format for your next appointment:   In Person  Provider:   You will see one of the following Advanced Practice Providers on your designated Care Team:    Sande Rives, PA-C  Coletta Memos, FNP  Then, Quay Burow, MD will plan to see you again in 12 month(s).     Other Instructions Please keep a blood pressure log for 30 days, we will have to see a PharmD to review and discuss Repatha.

## 2020-06-11 ENCOUNTER — Institutional Professional Consult (permissible substitution): Payer: PRIVATE HEALTH INSURANCE | Admitting: Cardiology

## 2020-06-14 ENCOUNTER — Other Ambulatory Visit (HOSPITAL_COMMUNITY): Payer: PRIVATE HEALTH INSURANCE

## 2020-06-14 ENCOUNTER — Other Ambulatory Visit: Payer: Self-pay | Admitting: *Deleted

## 2020-06-14 ENCOUNTER — Other Ambulatory Visit (HOSPITAL_COMMUNITY)
Admission: RE | Admit: 2020-06-14 | Discharge: 2020-06-14 | Disposition: A | Discharge status: Home / Self Care | Payer: Medicare Other | Source: Ambulatory Visit | Attending: Cardiology | Admitting: Cardiology

## 2020-06-14 DIAGNOSIS — I6300 Cerebral infarction due to thrombosis of unspecified precerebral artery: Secondary | ICD-10-CM

## 2020-06-14 DIAGNOSIS — Z20822 Contact with and (suspected) exposure to covid-19: Secondary | ICD-10-CM | POA: Insufficient documentation

## 2020-06-14 DIAGNOSIS — Z01812 Encounter for preprocedural laboratory examination: Secondary | ICD-10-CM | POA: Insufficient documentation

## 2020-06-14 DIAGNOSIS — Z951 Presence of aortocoronary bypass graft: Secondary | ICD-10-CM | POA: Diagnosis not present

## 2020-06-14 DIAGNOSIS — I10 Essential (primary) hypertension: Secondary | ICD-10-CM | POA: Diagnosis not present

## 2020-06-14 DIAGNOSIS — E782 Mixed hyperlipidemia: Secondary | ICD-10-CM | POA: Diagnosis not present

## 2020-06-14 LAB — SARS CORONAVIRUS 2 (TAT 6-24 HRS): SARS Coronavirus 2: NEGATIVE

## 2020-06-15 LAB — CBC
Hematocrit: 42.6 % (ref 37.5–51.0)
Hemoglobin: 14.6 g/dL (ref 13.0–17.7)
MCH: 31.6 pg (ref 26.6–33.0)
MCHC: 34.3 g/dL (ref 31.5–35.7)
MCV: 92 fL (ref 79–97)
Platelets: 214 10*3/uL (ref 150–450)
RBC: 4.62 x10E6/uL (ref 4.14–5.80)
RDW: 12.8 % (ref 11.6–15.4)
WBC: 7.8 10*3/uL (ref 3.4–10.8)

## 2020-06-15 LAB — BASIC METABOLIC PANEL
BUN/Creatinine Ratio: 18 (ref 10–24)
BUN: 31 mg/dL — ABNORMAL HIGH (ref 8–27)
CO2: 19 mmol/L — ABNORMAL LOW (ref 20–29)
Calcium: 8.8 mg/dL (ref 8.6–10.2)
Chloride: 108 mmol/L — ABNORMAL HIGH (ref 96–106)
Creatinine, Ser: 1.77 mg/dL — ABNORMAL HIGH (ref 0.76–1.27)
Glucose: 230 mg/dL — ABNORMAL HIGH (ref 65–99)
Potassium: 4.6 mmol/L (ref 3.5–5.2)
Sodium: 141 mmol/L (ref 134–144)
eGFR: 41 mL/min/{1.73_m2} — ABNORMAL LOW (ref >59)

## 2020-06-16 NOTE — Anesthesia Preprocedure Evaluation (Addendum)
Anesthesia Evaluation  Patient identified by MRN, date of birth, ID band Patient awake    Reviewed: Allergy & Precautions, H&P , NPO status , Patient's Chart, lab work & pertinent test results  Airway Mallampati: II  TM Distance: >3 FB Neck ROM: Full    Dental  (+) Dental Advisory Given, Teeth Intact   Pulmonary neg pulmonary ROS,    Pulmonary exam normal breath sounds clear to auscultation       Cardiovascular hypertension, Pt. on home beta blockers and Pt. on medications + CAD and + CABG  Normal cardiovascular exam Rhythm:Regular Rate:Normal  Echo 04/2020 1. Left ventricular ejection fraction, by estimation, is 55 to 60%. The left ventricle has normal function. The left ventricle has no regional wall motion abnormalities. The left ventricular internal cavity size was moderately dilated. There is mild concentric left ventricular hypertrophy. Left ventricular diastolic parameters are consistent with Grade I diastolic dysfunction (impaired relaxation).  2. Right ventricular systolic function is normal. The right ventricular size is normal. There is normal pulmonary artery systolic pressure.  3. Left atrial size was moderately dilated.  4. The mitral valve is normal in structure. Trivial mitral valve regurgitation. No evidence of mitral stenosis.  5. The aortic valve is tricuspid. Aortic valve regurgitation is not visualized. No aortic stenosis is present.  6. Aortic dilatation noted. There is mild dilatation of the aortic root, measuring 38 mm.  7. The inferior vena cava is normal in size with greater than 50% respiratory variability, suggesting right atrial pressure of 3 mmHg.    Neuro/Psych  Headaches, CVA negative psych ROS   GI/Hepatic negative GI ROS, Neg liver ROS,   Endo/Other  diabetes, Type 2, Insulin Dependent  Renal/GU Renal disease     Musculoskeletal  (+) Arthritis ,   Abdominal (+) + obese,   Peds   Hematology negative hematology ROS (+)   Anesthesia Other Findings   Reproductive/Obstetrics                            Anesthesia Physical  Anesthesia Plan  ASA: III  Anesthesia Plan: MAC   Post-op Pain Management:    Induction: Intravenous  PONV Risk Score and Plan: 1 and Propofol infusion, Treatment may vary due to age or medical condition, TIVA and Ondansetron  Airway Management Planned:   Additional Equipment: None  Intra-op Plan:   Post-operative Plan:   Informed Consent: I have reviewed the patients History and Physical, chart, labs and discussed the procedure including the risks, benefits and alternatives for the proposed anesthesia with the patient or authorized representative who has indicated his/her understanding and acceptance.     Dental advisory given  Plan Discussed with: CRNA  Anesthesia Plan Comments:        Anesthesia Quick Evaluation

## 2020-06-17 ENCOUNTER — Other Ambulatory Visit: Payer: Self-pay

## 2020-06-17 ENCOUNTER — Encounter (HOSPITAL_COMMUNITY)
Admission: RE | Disposition: A | Discharge status: Home / Self Care | Payer: Self-pay | Source: Ambulatory Visit | Attending: Cardiology

## 2020-06-17 ENCOUNTER — Ambulatory Visit (HOSPITAL_BASED_OUTPATIENT_CLINIC_OR_DEPARTMENT_OTHER): Payer: Medicare Other

## 2020-06-17 ENCOUNTER — Ambulatory Visit (HOSPITAL_COMMUNITY)
Admission: RE | Admit: 2020-06-17 | Discharge: 2020-06-17 | Disposition: A | Discharge status: Home / Self Care | Payer: Medicare Other | Source: Ambulatory Visit | Attending: Cardiology | Admitting: Cardiology

## 2020-06-17 ENCOUNTER — Ambulatory Visit (HOSPITAL_COMMUNITY): Payer: Medicare Other | Admitting: Anesthesiology

## 2020-06-17 ENCOUNTER — Encounter (HOSPITAL_COMMUNITY): Payer: Self-pay | Admitting: Cardiology

## 2020-06-17 DIAGNOSIS — Z794 Long term (current) use of insulin: Secondary | ICD-10-CM | POA: Diagnosis not present

## 2020-06-17 DIAGNOSIS — I700 Atherosclerosis of aorta: Secondary | ICD-10-CM | POA: Insufficient documentation

## 2020-06-17 DIAGNOSIS — I081 Rheumatic disorders of both mitral and tricuspid valves: Secondary | ICD-10-CM | POA: Diagnosis not present

## 2020-06-17 DIAGNOSIS — Z79899 Other long term (current) drug therapy: Secondary | ICD-10-CM | POA: Insufficient documentation

## 2020-06-17 DIAGNOSIS — Z7982 Long term (current) use of aspirin: Secondary | ICD-10-CM | POA: Insufficient documentation

## 2020-06-17 DIAGNOSIS — I6300 Cerebral infarction due to thrombosis of unspecified precerebral artery: Secondary | ICD-10-CM

## 2020-06-17 DIAGNOSIS — E119 Type 2 diabetes mellitus without complications: Secondary | ICD-10-CM | POA: Diagnosis not present

## 2020-06-17 DIAGNOSIS — I10 Essential (primary) hypertension: Secondary | ICD-10-CM | POA: Insufficient documentation

## 2020-06-17 DIAGNOSIS — I639 Cerebral infarction, unspecified: Secondary | ICD-10-CM

## 2020-06-17 DIAGNOSIS — Z881 Allergy status to other antibiotic agents status: Secondary | ICD-10-CM | POA: Insufficient documentation

## 2020-06-17 DIAGNOSIS — I2510 Atherosclerotic heart disease of native coronary artery without angina pectoris: Secondary | ICD-10-CM | POA: Diagnosis not present

## 2020-06-17 DIAGNOSIS — Z880 Allergy status to penicillin: Secondary | ICD-10-CM | POA: Diagnosis not present

## 2020-06-17 DIAGNOSIS — Z8616 Personal history of COVID-19: Secondary | ICD-10-CM | POA: Insufficient documentation

## 2020-06-17 DIAGNOSIS — Z7902 Long term (current) use of antithrombotics/antiplatelets: Secondary | ICD-10-CM | POA: Diagnosis not present

## 2020-06-17 DIAGNOSIS — Z882 Allergy status to sulfonamides status: Secondary | ICD-10-CM | POA: Insufficient documentation

## 2020-06-17 DIAGNOSIS — Z888 Allergy status to other drugs, medicaments and biological substances status: Secondary | ICD-10-CM | POA: Insufficient documentation

## 2020-06-17 DIAGNOSIS — E785 Hyperlipidemia, unspecified: Secondary | ICD-10-CM | POA: Diagnosis not present

## 2020-06-17 DIAGNOSIS — I340 Nonrheumatic mitral (valve) insufficiency: Secondary | ICD-10-CM

## 2020-06-17 HISTORY — PX: BUBBLE STUDY: SHX6837

## 2020-06-17 HISTORY — PX: TEE WITHOUT CARDIOVERSION: SHX5443

## 2020-06-17 LAB — GLUCOSE, CAPILLARY: Glucose-Capillary: 135 mg/dL — ABNORMAL HIGH (ref 70–99)

## 2020-06-17 SURGERY — ECHOCARDIOGRAM, TRANSESOPHAGEAL
Anesthesia: Monitor Anesthesia Care

## 2020-06-17 MED ORDER — PROPOFOL 500 MG/50ML IV EMUL
INTRAVENOUS | Status: DC | PRN
  Administered 2020-06-17: 150 ug/kg/min via INTRAVENOUS

## 2020-06-17 MED ORDER — ONDANSETRON HCL 4 MG/2ML IJ SOLN
INTRAMUSCULAR | Status: DC | PRN
  Administered 2020-06-17: 4 mg via INTRAVENOUS

## 2020-06-17 MED ORDER — PHENYLEPHRINE HCL (PRESSORS) 10 MG/ML IV SOLN
INTRAVENOUS | Status: DC | PRN
  Administered 2020-06-17: 80 ug via INTRAVENOUS

## 2020-06-17 MED ORDER — SODIUM CHLORIDE 0.9 % IV SOLN: INTRAVENOUS | Status: DC

## 2020-06-17 MED ORDER — LIDOCAINE HCL (CARDIAC) PF 100 MG/5ML IV SOSY
PREFILLED_SYRINGE | INTRAVENOUS | Status: DC | PRN
  Administered 2020-06-17: 20 mg via INTRATRACHEAL

## 2020-06-17 MED ORDER — BUTAMBEN-TETRACAINE-BENZOCAINE 2-2-14 % EX AERO
INHALATION_SPRAY | CUTANEOUS | Status: DC | PRN
  Administered 2020-06-17: 2 via TOPICAL

## 2020-06-17 NOTE — H&P (Signed)
Office Visit  06/04/2020 [facility] Seymour Bars, MD  Cardiology  Mixed hyperlipidemia +3 more  Dx  Referred by Lawerance Cruel, MD  Reason for Visit    Additional Documentation  Vitals:  BP 194/84Important (BP Location: Right Arm, Patient Position: Sitting, Cuff Size: Normal)  Pulse 60  Ht 6' (1.829 m)  Wt 110.3 kg  SpO2 97%  BMI 32.98 kg/m  BSA 2.37 m    More Vitals  Flowsheets:  Anthropometrics,  NEWS,  MEWS Score,  Method of Visit    Encounter Info:  Billing Info,  History,  Allergies,  Detailed Report     Orthostatic Vitals Recorded in This Encounter   06/04/2020  0808     Patient Position: Sitting  BP Location: Right Arm  Cuff Size: Normal   All Notes   Progress Notes by Lorretta Harp, MD at 06/04/2020 8:15 AM  Author: Lorretta Harp, MD Author Type: Physician Filed: 06/04/2020 9:36 AM  Note Status: Signed Cosign: Cosign Not Required Encounter Date: 06/04/2020  Editor: Lorretta Harp, MD (Physician)             Expand AllCollapse All       06/04/2020 Mitchell Lambert   06-21-1950  [id number]  Primary Physician Lawerance Cruel, MD Primary Cardiologist: Lorretta Harp MD [name], [place]  HPI:  Mitchell Lambert is a 70 y.o.  moderately overweight married Caucasian male father 55, grandfather of 7 grandchildren referred by Dr. Harrington Challenger to be established in my practice for ongoing cardiovascular care. He previously was taken care of by Dr. Wynonia Lawman. He works as a Health and safety inspector at [employer].I last saw him in the office 04/18/2019.His risk factors include treated hypertension, diabetes and hyperlipidemia. There is no family history for heart disease. He is never had a heart attack or stroke. He did undergo cardiac catheterization because of chest pain Dr. Wynonia Lawman February 2013 revealing three-vessel disease with preserved LV function and underwent CABG x4 by Dr. Roxan Hockey 04/28/2011 with a LIMA to  the LAD, left radial to OM1, vein to diagonal branch and PDA. He apparently had a Myoview stress test sometime after which which was negative. He gets occasional atypical chest pain once or twice a year.  He did come down with COVID-19 in November 2020 and again December 2021 and was hospitalized for 2 days the first time.  Since I saw him a year ago in addition he was hospitalized within the last month with a TIA.  His symptoms were of transient aphasia and visual disturbance.  He did have an MRA that showed a M2 segment stenosis.  The etiology of his stroke was never determined.  He was discharged home on Plavix and aspirin.  He is scheduled to see Dr. Quentin Ore in the office in several weeks for consideration of loop recorder implantation.  2D echo did not reveal embolic source but this was a TTE.  His blood pressures at home have remained elevated.  Active Medications      Current Meds  Medication Sig  . ACCU-CHEK GUIDE test strip USE TO TEST BLOOD SUGAR 2 TO 3 TIMES A DAY  . allopurinol (ZYLOPRIM) 300 MG tablet Take 300 mg by mouth daily.  Marland Kitchen aspirin EC 81 MG tablet Take 1 tablet (81 mg total) by mouth every morning. Continue for 3 more weeks then discontinue (as per Neurology)  . atorvastatin (LIPITOR) 40 MG tablet Take 40 mg by mouth daily.  Marland Kitchen  B-D ULTRAFINE III SHORT PEN 31G X 8 MM MISC USE UTD WITH FLEXPEN.  . Biotin 5000 MCG TABS Take 5,000 mcg by mouth daily.   . bisoprolol (ZEBETA) 10 MG tablet Take 10 mg by mouth daily.  . Cholecalciferol (VITAMIN D3) 5000 UNITS TABS Take 5,000 Units by mouth daily.   . clopidogrel (PLAVIX) 75 MG tablet Take 1 tablet (75 mg total) by mouth daily.  . fish oil-omega-3 fatty acids 1000 MG capsule Take 1 g by mouth 2 (two) times daily.  Marland Kitchen glucosamine-chondroitin 500-400 MG tablet Take 2 tablets by mouth daily.  . insulin aspart (NOVOLOG FLEXPEN) 100 UNIT/ML FlexPen Inject 10-15 Units into the skin as needed for high blood sugar.  . Insulin Glargine  (BASAGLAR KWIKPEN) 100 UNIT/ML SOPN Inject 100 Units into the skin in the morning. In AM  . Ipratropium-Albuterol (COMBIVENT) 20-100 MCG/ACT AERS respimat Inhale 1 puff into the lungs every 6 (six) hours as needed for wheezing.  Marland Kitchen L-Lysine HCl 1000 MG TABS Take 1 tablet by mouth daily.  Marland Kitchen LORazepam (ATIVAN) 0.5 MG tablet Take 0.5 mg by mouth daily as needed for anxiety.  . magnesium oxide (MAG-OX) 400 MG tablet Take 400 mg by mouth 2 (two) times daily.   . nitroGLYCERIN (NITROSTAT) 0.4 MG SL tablet Place 0.4 mg under the tongue as needed for chest pain.  Marland Kitchen omeprazole (PRILOSEC) 20 MG capsule Take 20 mg by mouth as needed (acid reflux).  . tamsulosin (FLOMAX) 0.4 MG CAPS capsule Take 0.4 mg by mouth as needed ("for kidney stone issues").  . traMADol (ULTRAM) 50 MG tablet Take 50 mg by mouth as needed for moderate pain.  . VENTOLIN HFA 108 (90 Base) MCG/ACT inhaler Inhale 2 puffs into the lungs as needed for wheezing or shortness of breath.  . vitamin C (ASCORBIC ACID) 500 MG tablet Take 500 mg by mouth daily.   Marland Kitchen VITAMIN E PO Take 1 capsule by mouth daily.  . [DISCONTINUED] irbesartan (AVAPRO) 300 MG tablet Take 300 mg by mouth daily.            Allergies  Allergen Reactions  . Gabapentin Other (See Comments)    "made me so dizzy I missed work for 2 days"  . Metformin And Related Shortness Of Breath  . Sulfa Antibiotics Shortness Of Breath and Rash  . Azithromycin Other (See Comments)    not good for heart condition  . Ciprofloxacin Other (See Comments)    tendon rupture  . Lisinopril Cough  . Losartan Potassium Other (See Comments)    hypotension  . Olmesartan Cough  . Amoxicillin Rash and Other (See Comments)    rash Did it involve swelling of the face/tongue/throat, SOB, or low BP? N Did it involve sudden or severe rash/hives, skin peeling, or any reaction on the inside of your mouth or nose?  Y Did you need to seek medical attention at a hospital or doctor's office?  N When did it last happen?"years and years ago" If all above answers are "NO", may proceed with cephalosporin use.   Marland Kitchen Doxycycline Rash  . Penicillins Rash    Social History        Socioeconomic History  . Marital status: Married    Spouse name: Not on file  . Number of children: Not on file  . Years of education: Not on file  . Highest education level: Not on file  Occupational History  . Not on file  Tobacco Use  . Smoking status: Never Smoker  .  Smokeless tobacco: Never Used  Vaping Use  . Vaping Use: Never used  Substance and Sexual Activity  . Alcohol use: Yes    Comment:  OCCAS BEER  OR MIXED DRINK LAST MARIJUANA COUPLE MONTHS AGO  . Drug use: Yes    Types: Marijuana    Comment: last marijuana use ~ 2010; "very rare". since  . Sexual activity: Yes  Other Topics Concern  . 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         Social Determinants of Health   Financial Resource Strain: Not on file  Food Insecurity: Not on file  Transportation Needs: Not on file  Physical Activity: Not on file  Stress: Not on file  Social Connections: Not on file  Intimate Partner Violence: Not on file     Review of Systems: General: negative for chills, fever, night sweats or weight changes.  Cardiovascular: negative for chest pain, dyspnea on exertion, edema, orthopnea, palpitations, paroxysmal nocturnal dyspnea or shortness of breath Dermatological: negative for rash Respiratory: negative for cough or wheezing Urologic: negative for hematuria Abdominal: negative for nausea, vomiting, diarrhea, bright red blood per rectum, melena, or hematemesis Neurologic: negative for visual changes, syncope, or dizziness All other systems reviewed and are otherwise negative except as noted above.    Blood pressure (!) 194/84, pulse 60, height 6' (1.829 m), weight 243 lb 3.2 oz (110.3 kg), SpO2 97  %.  General appearance: alert and no distress Neck: no adenopathy, no carotid bruit, no JVD, supple, symmetrical, trachea midline and thyroid not enlarged, symmetric, no tenderness/mass/nodules Lungs: clear to auscultation bilaterally Heart: regular rate and rhythm, S1, S2 normal, no murmur, click, rub or gallop Extremities: extremities normal, atraumatic, no cyanosis or edema Pulses: 2+ and symmetric Skin: Skin color, texture, turgor normal. No rashes or lesions Neurologic: Alert and oriented X 3, normal strength and tone. Normal symmetric reflexes. Normal coordination and gait  EKG not performed today  ASSESSMENT AND PLAN:   Hyperlipidemia History of hyperlipidemia on atorvastatin 40 mg a day with lipid profile performed 05/28/2020 revealing total cholesterol 176, LDL of 119 and HDL of 34.  We did speak about Repatha at his last office visit however because of the co-pay it was financially not feasible.  He is not at goal for secondary prevention.  We will get our Pharm.D.'s to further evaluate.  Hypertension History of essential hypertension with blood pressure measured today 194/84.  They recently bought an Omron blood pressure cuff and blood pressure at home runs in the 496-759 range systolic.  He is on Benicar and Zebeta.  I am going to increase his Benicar dose from 10 to 20 mg a day, will have him keep a 30-day blood pressure log and see a Pharm.D. after that for review and blood pressure cuff calibration.  I would like a systolic blood pressures to be in the 130 range.  We did talk about the importance of avoiding salt.  S/P CABG (coronary artery bypass graft) History of CAD status post CABG x4 by Dr. Roxan Hockey 04/28/2011 with a LIMA to his LAD, left radial to OM1 and a vein to diagonal branch and PDA.  A subsequent Myoview stress test was nonischemic.  He denies chest pain.  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  Transthoracic echo showed normal  LV function.  I am going to schedule him to have a TEE to look for  embolic source.  He is scheduled to see Dr. Quentin Ore in the office in the coming weeks for consideration of loop recorder implantation to look for A. fib.  He was placed on Plavix.      Lorretta Harp MD FACP,FACC,FAHA, [facility] 06/04/2020 8:57 AM        For TEE to R/O SOE; no changes Kirk Ruths

## 2020-06-17 NOTE — Transfer of Care (Signed)
Immediate Anesthesia Transfer of Care Note  Patient: Mitchell Lambert  Procedure(s) Performed: TRANSESOPHAGEAL ECHOCARDIOGRAM (TEE) (N/A ) BUBBLE STUDY  Patient Location: Endoscopy Unit  Anesthesia Type:MAC  Level of Consciousness: awake, alert  and sedated  Airway & Oxygen Therapy: Patient connected to nasal cannula oxygen  Post-op Assessment: Post -op Vital signs reviewed and stable  Post vital signs: stable  Last Vitals:  Vitals Value Taken Time  BP    Temp    Pulse    Resp    SpO2      Last Pain:  Vitals:   06/17/20 0708  TempSrc: Oral  PainSc: 0-No pain         Complications: No complications documented.

## 2020-06-17 NOTE — Anesthesia Postprocedure Evaluation (Signed)
Anesthesia Post Note  Patient: Mitchell Lambert  Procedure(s) Performed: TRANSESOPHAGEAL ECHOCARDIOGRAM (TEE) (N/A ) BUBBLE STUDY     Patient location during evaluation: Endoscopy Anesthesia Type: MAC Level of consciousness: awake and alert Pain management: pain level controlled Vital Signs Assessment: post-procedure vital signs reviewed and stable Respiratory status: spontaneous breathing Cardiovascular status: stable Anesthetic complications: no   No complications documented.  Last Vitals:  Vitals:   06/17/20 0807 06/17/20 0820  BP: (!) 133/45 (!) 151/68  Pulse: (!) 53 (!) 52  Resp: 18 13  Temp:    SpO2: 95% 94%    Last Pain:  Vitals:   06/17/20 0820  TempSrc:   PainSc: 0-No pain                 Nolon Nations

## 2020-06-17 NOTE — Discharge Instructions (Signed)
TEE  YOU HAD AN CARDIAC PROCEDURE TODAY: Refer to the procedure report and other information in the discharge instructions given to you for any specific questions about what was found during the examination. If this information does not answer your questions, please call [facility] office at [phone] to clarify.   DIET: Your first meal following the procedure should be a light meal and then it is ok to progress to your normal diet. A half-sandwich or bowl of soup is an example of a good first meal. Heavy or fried foods are harder to digest and may make you feel nauseous or bloated. Drink plenty of fluids but you should avoid alcoholic beverages for 24 hours.  ACTIVITY: Your care partner should take you home directly after the procedure. You should plan to take it easy, moving slowly for the rest of the day. You can resume normal activity the day after the procedure however YOU SHOULD NOT DRIVE, use power tools, machinery or perform tasks that involve climbing or major physical exertion for 24 hours (because of the sedation medicines used during the test).   SYMPTOMS TO REPORT IMMEDIATELY: A cardiologist can be reached at any hour. Please call [phone] for any of the following symptoms:  Vomiting of blood or coffee ground material  New, significant abdominal pain  New, significant chest pain or pain under the shoulder blades  Painful or persistently difficult swallowing  New shortness of breath  Black, tarry-looking or red, bloody stools  FOLLOW UP:  Please also call with any specific questions about appointments or follow up tests.

## 2020-06-17 NOTE — Anesthesia Procedure Notes (Signed)
Procedure Name: MAC Date/Time: 06/17/2020 7:44 AM Performed by: Lavell Luster, CRNA Pre-anesthesia Checklist: Patient identified, Emergency Drugs available, Suction available, Patient being monitored and Timeout performed Patient Re-evaluated:Patient Re-evaluated prior to induction Oxygen Delivery Method: Nasal cannula Preoxygenation: Pre-oxygenation with 100% oxygen Induction Type: IV induction Placement Confirmation: breath sounds checked- equal and bilateral and positive ETCO2 Dental Injury: Teeth and Oropharynx as per pre-operative assessment

## 2020-06-17 NOTE — Progress Notes (Signed)
  Echocardiogram Echocardiogram Transesophageal has been performed.  Mitchell Lambert 06/17/2020, 8:31 AM

## 2020-06-17 NOTE — Progress Notes (Signed)
    Transesophageal Echocardiogram Note  Mitchell Lambert [id number] 27-Aug-1950  Procedure: Transesophageal Echocardiogram Indications: TIA  Procedure Details Consent: Obtained Time Out: Verified patient identification, verified procedure, site/side was marked, verified correct patient position, special equipment/implants available, Radiology Safety Procedures followed,  medications/allergies/relevent history reviewed, required imaging and test results available.  Performed  Medications:  Pt sedated by anesthesia with lidocaine 20 mg and diprovan 307 mg IV total.  Normal LV function; mild MR; atrial septal aneurysm; negative saline microcavitation study.   Complications: No apparent complications Patient did tolerate procedure well.  Kirk Ruths, MD

## 2020-06-17 NOTE — Interval H&P Note (Signed)
History and Physical Interval Note:  06/17/2020 7:11 AM  Mitchell Lambert  has presented today for surgery, with the diagnosis of STROKE.  The various methods of treatment have been discussed with the patient and family. After consideration of risks, benefits and other options for treatment, the patient has consented to  Procedure(s): TRANSESOPHAGEAL ECHOCARDIOGRAM (TEE) (N/A) as a surgical intervention.  The patient's history has been reviewed, patient examined, no change in status, stable for surgery.  I have reviewed the patient's chart and labs.  Questions were answered to the patient's satisfaction.     Kirk Ruths

## 2020-06-18 ENCOUNTER — Encounter (HOSPITAL_COMMUNITY): Payer: Self-pay | Admitting: Cardiology

## 2020-06-18 ENCOUNTER — Ambulatory Visit (INDEPENDENT_AMBULATORY_CARE_PROVIDER_SITE_OTHER): Payer: Medicare Other | Admitting: Cardiology

## 2020-06-18 VITALS — BP 150/80 | HR 54 | Ht 71.0 in | Wt 245.0 lb

## 2020-06-18 DIAGNOSIS — I10 Essential (primary) hypertension: Secondary | ICD-10-CM

## 2020-06-18 DIAGNOSIS — I639 Cerebral infarction, unspecified: Secondary | ICD-10-CM | POA: Diagnosis not present

## 2020-06-18 DIAGNOSIS — I2510 Atherosclerotic heart disease of native coronary artery without angina pectoris: Secondary | ICD-10-CM | POA: Diagnosis not present

## 2020-06-18 NOTE — Patient Instructions (Addendum)
Medication Instructions:  Your physician recommends that you continue on your current medications as directed. Please refer to the Current Medication list given to you today.  Labwork: None ordered.  Testing/Procedures: None ordered.  Follow-Up:  Your physician wants you to follow-up in: as needed with Dr. Quentin Ore.     Implantable Loop Recorder Placement, Care After This sheet gives you information about how to care for yourself after your procedure. Your health care provider may also give you more specific instructions. If you have problems or questions, contact your health care provider. What can I expect after the procedure? After the procedure, it is common to have:  Soreness or discomfort near the incision.  Some swelling or bruising near the incision.  Follow these instructions at home: Incision care  1.  Leave your outer dressing on for 72 hours.  After 72 hours you can remove your outer dressing and shower. 2. Leave adhesive strips in place. These skin closures may need to stay in place for 1-2 weeks. If adhesive strip edges start to loosen and curl up, you may trim the loose edges.  You may remove the strips if they have not fallen off after 2 weeks. 3. Check your incision area every day for signs of infection. Check for: a. Redness, swelling, or pain. b. Fluid or blood. c. Warmth. d. Pus or a bad smell. 4. Do not take baths, swim, or use a hot tub until your incision is completely healed. 5. If your wound site starts to bleed apply pressure.      If you have any questions/concerns please call the device clinic at [phone].  Activity  Return to your normal activities.  General instructions  Follow instructions from your health care provider about how to manage your implantable loop recorder and transmit the information. Learn how to activate a recording if this is necessary for your type of device.  Do not go through a metal detection gate, and do not let  someone hold a metal detector over your chest. Show your ID card.  Do not have an MRI unless you check with your health care provider first.  Take over-the-counter and prescription medicines only as told by your health care provider.  Keep all follow-up visits as told by your health care provider. This is important. Contact a health care provider if:  You have redness, swelling, or pain around your incision.  You have a fever.  You have pain that is not relieved by your pain medicine.  You have triggered your device because of fainting (syncope) or because of a heartbeat that feels like it is racing, slow, fluttering, or skipping (palpitations). Get help right away if you have:  Chest pain.  Difficulty breathing. Summary  After the procedure, it is common to have soreness or discomfort near the incision.  Change your dressing as told by your health care provider.  Follow instructions from your health care provider about how to manage your implantable loop recorder and transmit the information.  Keep all follow-up visits as told by your health care provider. This is important. This information is not intended to replace advice given to you by your health care provider. Make sure you discuss any questions you have with your health care provider. Document Released: 01/28/2015 Document Revised: 04/03/2017 Document Reviewed: 04/03/2017 Elsevier Patient Education  2020 [employer].

## 2020-06-18 NOTE — Progress Notes (Signed)
Electrophysiology Office Note:    Date:  06/18/2020   ID:  Mitchell Lambert, DOB Nov 13, 1950, MRN [id number]  PCP:  Lawerance Cruel, MD  [facility] Cardiologist:  Quay Burow, MD  [facility] Electrophysiologist:  Vickie Epley, MD   Referring MD: Lawerance Cruel, MD   Chief Complaint: CVA  History of Present Illness:    Mitchell Lambert is a 70 y.o. male who presents for an evaluation of CVA at the request of Dr. Harrington Challenger. Their medical history includes hypertension, diabetes, hyperlipidemia.  He also has a history of severe coronary artery disease post four-vessel bypass in 2013 by Dr. Roxan Hockey.  His history also includes a COVID-19 infection in November 2020 and December 2021.  Last month, he was hospitalized after he developed sudden onset aphasia and right eye visual changes that occurred suddenly while he was at work.  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  He knew something was wrong so he got in his car and drove home.  He says when he was at home his right eye started to not be able to focus.  He try to communicate with his wife who immediately noted something was terribly wrong so she put him in the car and drove him to the emergency department.  There he was diagnosed with a TIA.  Echocardiogram was performed during the hospitalization which was relatively normal.  He had a TEE performed yesterday which showed no evidence of an interatrial shunt and mild plaque involving the descending aorta.  Today he presents for loop recorder implantation as part of an ongoing atrial fibrillation surveillance strategy.  Past Medical History:  Diagnosis Date  . Angina    NO ANGINA SINCE CABG  . Arthritis    right ankle  . Chronic daily headache    "@ least every other day""improved since heart surgery"  . Coronary artery disease    PT STATES HEART DOING WELL SINCE CABG SURGERY - DR.  TILLEY IS HIS CARDIOLOGIST  . Diabetes mellitus    Lantus x 10 yrs  . Gout   . History of kidney stones 09-08-12   multiple times with some lithotripsies  . Hyperlipemia   . Hypertension     Past Surgical History:  Procedure Laterality Date  . BACK SURGERY     microsurgery: spinal stenosis -lumbar  . bone spur  1990's   right great toe; "probably related to gout"  . BUBBLE STUDY  06/17/2020   Procedure: BUBBLE STUDY;  Surgeon: Lelon Perla, MD;  Location: [place];  Service: Cardiovascular;;  . CARDIAC CATHETERIZATION  04/27/11  . CATARACT EXTRACTION W/ INTRAOCULAR LENS IMPLANT  1990's   right; "lens was replaced twice over 3 months; lens is actually over iris"  . CORONARY ARTERY BYPASS GRAFT  04/30/2011   Procedure: CORONARY ARTERY BYPASS GRAFTING (CABG);  Surgeon: Melrose Nakayama, MD;  Location: [place];  Service: Open Heart Surgery;  Laterality: N/A;,x4 vessels  . CYSTOSCOPY W/ URETERAL STENT PLACEMENT Right 08/19/2012   Procedure: CYSTOSCOPY WITH RETROGRADE PYELOGRAM/URETERAL STENT PLACEMENT;  Surgeon: Molli Hazard, MD;  Location: [facility];  Service: Urology;  Laterality: Right;  . CYSTOSCOPY WITH RETROGRADE PYELOGRAM, URETEROSCOPY AND STENT PLACEMENT Left 07/17/2013   Procedure: CYSTOSCOPY, LEFT URETEROSCOPY, BASKET EXTRACTION OF STONE, INSERTION OF LEFT URETERAL STENT, ;  Surgeon: Jorja Loa, MD;  Location: [facility];  Service: Urology;  Laterality: Left;  . CYSTOSCOPY/RETROGRADE/URETEROSCOPY Right 09/12/2012   Procedure: CYSTOSCOPY/RETROGRADE/URETEROSCOPY;  Surgeon: Franchot Gallo, MD;  Location: [facility];  Service: Urology;  Laterality: Right;  . HOLMIUM LASER APPLICATION Right 2/56/3893   Procedure: HOLMIUM LASER APPLICATION;  Surgeon: Franchot Gallo, MD;  Location: [facility];  Service: Urology;  Laterality: Right;  . HOLMIUM LASER APPLICATION Left 7/34/2876   Procedure: HOLMIUM LASER APPLICATION;  Surgeon: Jorja Loa, MD;  Location: [facility];   Service: Urology;  Laterality: Left;  . KIDNEY STONE SURGERY     "probably 3 times"  . LEFT HEART CATHETERIZATION WITH CORONARY ANGIOGRAM N/A 04/28/2011   Procedure: LEFT HEART CATHETERIZATION WITH CORONARY ANGIOGRAM;  Surgeon: Jacolyn Reedy, MD;  Location: [facility] C[facility] LAB;  Service: Cardiovascular;  Laterality: N/A;  . LITHOTRIPSY     "many; probably 5 times"  . RADIAL ARTERY HARVEST  04/30/2011   Procedure: RADIAL ARTERY HARVEST;  Surgeon: Melrose Nakayama, MD;  Location: [place];  Service: Open Heart Surgery;  Laterality: Left;  . RETINAL DETACHMENT SURGERY  1990's   right eye; "made me have an early cataract"  . TEE WITHOUT CARDIOVERSION N/A 06/17/2020   Procedure: TRANSESOPHAGEAL ECHOCARDIOGRAM (TEE);  Surgeon: Lelon Perla, MD;  Location: [facility] ENDOSCOPY;  Service: Cardiovascular;  Laterality: N/A;  . VASECTOMY      Current Medications: Current Meds  Medication Sig  . ACCU-CHEK GUIDE test strip USE TO TEST BLOOD SUGAR 2 TO 3 TIMES A DAY  . allopurinol (ZYLOPRIM) 300 MG tablet Take 300 mg by mouth daily.  Marland Kitchen amLODipine (NORVASC) 2.5 MG tablet Take 1 tablet (2.5 mg total) by mouth daily.  Marland Kitchen atorvastatin (LIPITOR) 40 MG tablet Take 40 mg by mouth daily.  . B-D ULTRAFINE III SHORT PEN 31G X 8 MM MISC USE UTD WITH FLEXPEN.  . Biotin 5000 MCG TABS Take 5,000 mcg by mouth daily.   . bisoprolol (ZEBETA) 10 MG tablet Take 10 mg by mouth daily.  . Cholecalciferol (VITAMIN D3) 5000 UNITS TABS Take 5,000 Units by mouth daily.   . clopidogrel (PLAVIX) 75 MG tablet Take 1 tablet (75 mg total) by mouth daily.  . fish oil-omega-3 fatty acids 1000 MG capsule Take 1 g by mouth 2 (two) times daily.  Marland Kitchen glucosamine-chondroitin 500-400 MG tablet Take 2 tablets by mouth daily.  . insulin aspart (NOVOLOG FLEXPEN) 100 UNIT/ML FlexPen Inject 10-15 Units into the skin as needed for high blood sugar.  . Insulin Glargine (BASAGLAR KWIKPEN) 100 UNIT/ML SOPN Inject 100 Units into the skin in the morning. In AM   . Ipratropium-Albuterol (COMBIVENT) 20-100 MCG/ACT AERS respimat Inhale 1 puff into the lungs every 6 (six) hours as needed for wheezing.  . irbesartan (AVAPRO) 300 MG tablet Take 1 tablet (300 mg total) by mouth daily.  Marland Kitchen L-Lysine HCl 1000 MG TABS Take 1 tablet by mouth daily.  Marland Kitchen LORazepam (ATIVAN) 0.5 MG tablet Take 0.5 mg by mouth daily as needed for anxiety.  . magnesium oxide (MAG-OX) 400 MG tablet Take 400 mg by mouth 2 (two) times daily.   . nitroGLYCERIN (NITROSTAT) 0.4 MG SL tablet Place 0.4 mg under the tongue as needed for chest pain.  Marland Kitchen omeprazole (PRILOSEC) 20 MG capsule Take 20 mg by mouth as needed (acid reflux).  . tamsulosin (FLOMAX) 0.4 MG CAPS capsule Take 0.4 mg by mouth as needed ("for kidney stone issues").  . traMADol (ULTRAM) 50 MG tablet Take 50 mg by mouth as needed for moderate pain.  Mitchell Lambert HFA  108 (90 Base) MCG/ACT inhaler Inhale 2 puffs into the lungs as needed for wheezing or shortness of breath.  . vitamin C (ASCORBIC ACID) 500 MG tablet Take 500 mg by mouth daily.   Marland Kitchen VITAMIN E PO Take 1 capsule by mouth daily.     Allergies:   Gabapentin, Metformin and related, Sulfa antibiotics, Azithromycin, Ciprofloxacin, Lisinopril, Losartan potassium, Olmesartan, Amoxicillin, Doxycycline, and Penicillins   Social History   Socioeconomic History  . Marital status: Married    Spouse name: Not on file  . Number of children: Not on file  . Years of education: Not on file  . Highest education level: Not on file  Occupational History  . Not on file  Tobacco Use  . Smoking status: Never Smoker  . Smokeless tobacco: Never Used  Vaping Use  . Vaping Use: Never used  Substance and Sexual Activity  . Alcohol use: Yes    Comment:  OCCAS BEER  OR MIXED DRINK LAST MARIJUANA COUPLE MONTHS AGO  . Drug use: Yes    Types: Marijuana    Comment: last marijuana use ~ 2010; "very rare". since  . Sexual activity: Yes  Other Topics Concern  . 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         Social Determinants of Health   Financial Resource Strain: Not on file  Food Insecurity: Not on file  Transportation Needs: Not on file  Physical Activity: Not on file  Stress: Not on file  Social Connections: Not on file     Family History: The patient's family history includes Cataracts in his father and mother; Diabetes in his paternal aunt and paternal grandmother. There is no history of Amblyopia, Blindness, Glaucoma, Macular degeneration, Retinal detachment, Strabismus, or Retinitis pigmentosa.  ROS:   Please see the history of present illness.    All other systems reviewed and are negative.  EKGs/Labs/Other Studies Reviewed:    The following studies were reviewed today: Hospital records  June 17, 2020 transesophageal echocardiogram personally reviewed Left ventricular function normal, 55% Right ventricular function normal Mildly dilated left atrium No significant valvular abnormalities No evidence of an interatrial shunt  EKG:  The ekg ordered today demonstrates sinus rhythm  Recent Labs: 05/27/2020: ALT 19 06/14/2020: BUN 31; Creatinine, Ser 1.77; Hemoglobin 14.6; Platelets 214; Potassium 4.6; Sodium 141  Recent Lipid Panel    Component Value Date/Time   CHOL 176 05/28/2020 0336   TRIG 115 05/28/2020 0336   HDL 34 (L) 05/28/2020 0336   CHOLHDL 5.2 05/28/2020 0336   VLDL 23 05/28/2020 0336   LDLCALC 119 (H) 05/28/2020 0336    Physical Exam:    VS:  BP (!) 150/80 (BP Location: Left Arm, Patient Position: Sitting, Cuff Size: Normal)   Pulse (!) 54   Ht 5\' 11"  (1.803 m)   Wt 245 lb (111.1 kg)   SpO2 96%   BMI 34.17 kg/m     Wt Readings from Last 3 Encounters:  06/18/20 245 lb (111.1 kg)  06/17/20 240 lb (108.9 kg)  06/04/20 243 lb 3.2 oz (110.3 kg)     GEN:  Well nourished, well developed in no acute distress HEENT: Normal NECK: No JVD; No carotid  bruits LYMPHATICS: No lymphadenopathy CARDIAC: RRR, no murmurs, rubs, gallops RESPIRATORY:  Clear to auscultation without rales, wheezing or rhonchi  ABDOMEN: Soft, non-tender, non-distended MUSCULOSKELETAL:  No edema; No deformity  SKIN: Warm and dry  NEUROLOGIC:  Alert and oriented x 3 PSYCHIATRIC:  Normal affect   ASSESSMENT:    1. Cryptogenic stroke ([place])   2. Primary hypertension   3. Coronary artery disease involving native coronary artery of native heart, unspecified whether angina present    PLAN:    In order of problems listed above:  1. Cryptogenic stroke Patient with relatively recent history of a cryptogenic stroke.  Evaluation thus far has been unrevealing for cause.  Today we discussed using a loop recorder to provide ongoing surveillance for atrial fibrillation.  He would like to proceed.  2.  Hypertension Continue home medication regimen including bisoprolol, amlodipine, irbesartan  3.  Coronary artery disease post CABG No ischemic symptoms   Medication Adjustments/Labs and Tests Ordered: Current medicines are reviewed at length with the patient today.  Concerns regarding medicines are outlined above.  Orders Placed This Encounter  Procedures  . EKG 12-Lead   No orders of the defined types were placed in this encounter.    Signed, Hilton Cork. Quentin Ore, MD, [facility], [facility] 06/18/2020 7:50 PM    Electrophysiology [facility]     -----------------------------------------------------------------------------------------  SURGEON:  Lars Mage, MD    PREPROCEDURE DIAGNOSIS:  Cryptogenic stroke    POSTPROCEDURE DIAGNOSIS:  Cryptogenic stroke     PROCEDURES:   1. Implantable loop recorder implantation    INTRODUCTION:  JASHAD DEPAULA is a 70 y.o. patient with a history of cryptogenic stroke. The patient therefore presents today for implantable loop implantation.     DESCRIPTION OF PROCEDURE:  Informed written consent was  obtained.  The patient required no sedation for the procedure today.  Mapping over the patient's chest was performed to identify the area where electrograms were most prominent for ILR recording.  This area was found to be the left parasternal region over the 4th intercostal space. The patients left chest was therefore prepped and draped in the usual sterile fashion. The skin overlying the left parasternal region was infiltrated with lidocaine for local analgesia.  A 0.5-cm incision was made over the left parasternal region over the 3rd intercostal space.  A subcutaneous ILR pocket was fashioned using a combination of sharp and blunt dissection.  A Medtronic Reveal Linq model [id number]) implantable loop recorder was then placed into the pocket  R waves were very prominent and measured >0.51mV.  Steri- Strips and a sterile dressing were then applied.  There were no early apparent complications.     CONCLUSIONS:   1. Successful implantation of a Medtronic Reveal LINQ implantable loop recorder for a history of cryptogenic stroke  2. No early apparent complications.   Lars Mage, MD 04/29/2020 3:51 PM

## 2020-07-04 ENCOUNTER — Ambulatory Visit (INDEPENDENT_AMBULATORY_CARE_PROVIDER_SITE_OTHER): Payer: Medicare Other | Admitting: Pharmacist

## 2020-07-04 ENCOUNTER — Other Ambulatory Visit: Payer: Self-pay | Admitting: Cardiovascular Disease

## 2020-07-04 ENCOUNTER — Other Ambulatory Visit: Payer: Self-pay

## 2020-07-04 VITALS — BP 150/88 | HR 56 | Resp 16 | Ht 72.0 in | Wt 242.6 lb

## 2020-07-04 DIAGNOSIS — I10 Essential (primary) hypertension: Secondary | ICD-10-CM

## 2020-07-04 DIAGNOSIS — E782 Mixed hyperlipidemia: Secondary | ICD-10-CM | POA: Diagnosis not present

## 2020-07-04 MED ORDER — AMLODIPINE BESYLATE 5 MG PO TABS: 5.0000 mg | ORAL_TABLET | Freq: Every day | ORAL | 1 refills | Status: DC

## 2020-07-04 NOTE — Progress Notes (Signed)
Patient ID: Mitchell Lambert                 DOB: 1950/07/27                    MRN: [id number]     HPI: Mitchell Lambert is a 70 y.o. male patient referred to lipid clinic by [facility] to PharmD clinic. PMH is significant for CVA, hypertension, diabetes,  hyperlipidemia, CAD s/p CABG, gout, and history of COVID infection (November 2020 and December 2021) .  Patient presents to clinic accompany by spouse. Denies problems with current therapy and reports compliance. Plan to initiate PCSK9i and titrate blood pressure medication.  Current Medications:  Atorvastatin 40mg  daily  BP medication: Amlodipine 2.5mg  daily  Bisoprolol 10mg  daily Irbesartan 300mg  daily  Intolerances:  Lisinopril - cough Losartan - hypotension Olmesartan - cough  LDL goal: < 70mg /dL (ideal 55mg /dL d/t very high risk)  Diet: trying to make changes. Cutting down on carbohydrates, butter ans meat.   Family History: family history includes Cataracts in his father and mother; Diabetes in his paternal aunt and paternal grandmother. There is no history of Amblyopia, Blindness, Glaucoma, Macular degeneration, Retinal detachment, Strabismus, or Retinitis pigmentosa. Father stroke 67s, mother 57yo.  Social History: occasional beer, mix drink and marijuana use  Labs: 05/28/2020: CHO 176, TG 115, HDL 34, A1C 8.4, LDL-c 119 on atorvastatin 05/27/2020: Na 142, K 4.4, Cl 107, BUN 34, Scr 1.80, Glucose 240  Past Medical History:  Diagnosis Date  . Angina    NO ANGINA SINCE CABG  . Arthritis    right ankle  . Chronic daily headache    "@ least every other day""improved since heart surgery"  . Coronary artery disease    PT STATES HEART DOING WELL SINCE CABG SURGERY - DR. TILLEY IS HIS CARDIOLOGIST  . Diabetes mellitus    Lantus x 10 yrs  . Gout   . History of kidney stones 09-08-12   multiple times with some lithotripsies  . Hyperlipemia   . Hypertension     Current Outpatient Medications on File Prior to Visit   Medication Sig Dispense Refill  . ACCU-CHEK GUIDE test strip USE TO TEST BLOOD SUGAR 2 TO 3 TIMES A DAY  5  . allopurinol (ZYLOPRIM) 300 MG tablet Take 300 mg by mouth daily.    Marland Kitchen atorvastatin (LIPITOR) 40 MG tablet Take 40 mg by mouth daily.    . B-D ULTRAFINE III SHORT PEN 31G X 8 MM MISC USE UTD WITH FLEXPEN.  3  . Biotin 5000 MCG TABS Take 5,000 mcg by mouth daily.     . bisoprolol (ZEBETA) 10 MG tablet Take 10 mg by mouth daily.    . clopidogrel (PLAVIX) 75 MG tablet Take 1 tablet (75 mg total) by mouth daily. 30 tablet 0  . fish oil-omega-3 fatty acids 1000 MG capsule Take 1 g by mouth 2 (two) times daily.    Marland Kitchen glucosamine-chondroitin 500-400 MG tablet Take 2 tablets by mouth daily.    . insulin aspart (NOVOLOG FLEXPEN) 100 UNIT/ML FlexPen Inject 10-15 Units into the skin as needed for high blood sugar.    . Insulin Glargine (BASAGLAR KWIKPEN) 100 UNIT/ML SOPN Inject 100 Units into the skin in the morning. In AM    . Ipratropium-Albuterol (COMBIVENT) 20-100 MCG/ACT AERS respimat Inhale 1 puff into the lungs every 6 (six) hours as needed for wheezing. 4 g 1  . irbesartan (AVAPRO) 300 MG tablet Take 1 tablet (300  mg total) by mouth daily. 90 tablet 0  . LORazepam (ATIVAN) 0.5 MG tablet Take 0.5 mg by mouth daily as needed for anxiety.    . magnesium oxide (MAG-OX) 400 MG tablet Take 400 mg by mouth 2 (two) times daily.     . Multiple Vitamins-Minerals (MULTIVITAMIN WITH MINERALS) tablet Take 1 tablet by mouth daily.    . nitroGLYCERIN (NITROSTAT) 0.4 MG SL tablet Place 0.4 mg under the tongue as needed for chest pain.    Marland Kitchen omeprazole (PRILOSEC) 20 MG capsule Take 20 mg by mouth as needed (acid reflux).    . tamsulosin (FLOMAX) 0.4 MG CAPS capsule Take 0.4 mg by mouth as needed ("for kidney stone issues").    . traMADol (ULTRAM) 50 MG tablet Take 50 mg by mouth as needed for moderate pain.    . VENTOLIN HFA 108 (90 Base) MCG/ACT inhaler Inhale 2 puffs into the lungs as needed for wheezing  or shortness of breath.     No current facility-administered medications on file prior to visit.    Allergies  Allergen Reactions  . Gabapentin Other (See Comments)    "made me so dizzy I missed work for 2 days"  . Metformin And Related Shortness Of Breath  . Sulfa Antibiotics Shortness Of Breath and Rash  . Azithromycin Other (See Comments)    not good for heart condition  . Ciprofloxacin Other (See Comments)    tendon rupture  . Lisinopril Cough  . Losartan Potassium Other (See Comments)    hypotension  . Olmesartan Cough  . Simvastatin Cough  . Amoxicillin Rash and Other (See Comments)    rash Did it involve swelling of the face/tongue/throat, SOB, or low BP? N Did it involve sudden or severe rash/hives, skin peeling, or any reaction on the inside of your mouth or nose?  Y Did you need to seek medical attention at a hospital or doctor's office? N When did it last happen?"years and years ago" If all above answers are "NO", may proceed with cephalosporin use.   Marland Kitchen Doxycycline Rash  . Penicillins Rash    Hypertension Blood pressure reminds above goal but greatly improved since adding amlodipine and decreasing sodium in diet. Will increase amlodipine dose to 5mg  daily and follow up in 4 weeks.  Hyperlipidemia LDL remains above goal for secondary prevention while on high intensity statin. Patient report compliance with dietary changes, and daily atorvastatin. Reports some aches with statin, but never stop therapy.  We discussed transition to more plant based diet, and increase physical activity. Repatha/Praleunt also discussed during this visit. Bempedoic acid was not recommended d/t lack of outcome data. Mr Caras is a VERY HIGH risk patient and need to target LDL < 70 , ideally below 55.   Patient agreed on initiating PCSK9i therapy. Praluent is the prefer product for his insurance. Will start prior-authorization and repeat fasting blood work after 4 doses of new  medication.    Courvoisier Hamblen Rodriguez-Guzman PharmD, BCPS, [place] [address] [place] [address] 07/05/2020 4:32 PM

## 2020-07-04 NOTE — Telephone Encounter (Signed)
Rx(s) sent to pharmacy electronically.  

## 2020-07-04 NOTE — Patient Instructions (Addendum)
Your Results:             Your most recent labs Goal  Total Cholesterol 176 < 200  Triglycerides 115 < 150  HDL (good cholesterol) 34 > 40  LDL (bad cholesterol) 119 < 70     Medication changes: *INCREASE amlodipine dose to 5mg  daily*  *START taking Praluent 150mg  every 14 days*  *HOLD atorvastatin 40mg  for 1 week after you start Praluent 150mg  (1st injection), if muscle/joint pain resolves, call me back to start lowe dose statin. If pain is not improved, resume atorvastatin 50md daily*  Lab orders: *Repeat fasting blood work after 4 dose of new medication  Clinic phone number: [phone] Mitchell Lambert/ Kristin/ Hale[place]  Thank you for choosing [facility]

## 2020-07-05 ENCOUNTER — Encounter: Payer: Self-pay | Admitting: Pharmacist

## 2020-07-05 NOTE — Assessment & Plan Note (Signed)
LDL remains above goal for secondary prevention while on high intensity statin. Patient report compliance with dietary changes, and daily atorvastatin. Reports some aches with statin, but never stop therapy.  We discussed transition to more plant based diet, and increase physical activity. Repatha/Praleunt also discussed during this visit. Bempedoic acid was not recommended d/t lack of outcome data. Mitchell Lambert is a VERY HIGH risk patient and need to target LDL < 70 , ideally below 55.   Patient agreed on initiating PCSK9i therapy. Praluent is the prefer product for his insurance. Will start prior-authorization and repeat fasting blood work after 4 doses of new medication.

## 2020-07-05 NOTE — Assessment & Plan Note (Signed)
Blood pressure reminds above goal but greatly improved since adding amlodipine and decreasing sodium in diet. Will increase amlodipine dose to 5mg  daily and follow up in 4 weeks.

## 2020-07-08 ENCOUNTER — Other Ambulatory Visit: Payer: Self-pay

## 2020-07-08 ENCOUNTER — Telehealth: Payer: Self-pay

## 2020-07-08 DIAGNOSIS — E785 Hyperlipidemia, unspecified: Secondary | ICD-10-CM

## 2020-07-08 MED ORDER — PRALUENT 150 MG/ML ~~LOC~~ SOAJ: 150.0000 mg | SUBCUTANEOUS | 11 refills | Status: DC

## 2020-07-08 NOTE — Telephone Encounter (Signed)
Called and lmomed pt that they were approved for the praluent 150mg , rx sent, pt instructed to complete fasting labs post 4th shot and to call if med is unaffordable

## 2020-07-08 NOTE — Telephone Encounter (Signed)
alled and lmomed that I left oe thing out about scheduling 4-5 wk f/up htn visit w/pharmd

## 2020-07-21 ENCOUNTER — Other Ambulatory Visit: Payer: Self-pay

## 2020-07-21 ENCOUNTER — Encounter (HOSPITAL_BASED_OUTPATIENT_CLINIC_OR_DEPARTMENT_OTHER): Payer: Self-pay | Admitting: Obstetrics and Gynecology

## 2020-07-21 ENCOUNTER — Inpatient Hospital Stay (HOSPITAL_BASED_OUTPATIENT_CLINIC_OR_DEPARTMENT_OTHER)
Admission: EM | Admit: 2020-07-21 | Discharge: 2020-07-25 | DRG: 065 | Disposition: A | Discharge status: Rehabilitation Facility | Payer: Medicare Other | Attending: Internal Medicine | Admitting: Internal Medicine

## 2020-07-21 ENCOUNTER — Emergency Department (HOSPITAL_BASED_OUTPATIENT_CLINIC_OR_DEPARTMENT_OTHER): Payer: Medicare Other

## 2020-07-21 DIAGNOSIS — Z20822 Contact with and (suspected) exposure to covid-19: Secondary | ICD-10-CM | POA: Diagnosis present

## 2020-07-21 DIAGNOSIS — I2510 Atherosclerotic heart disease of native coronary artery without angina pectoris: Secondary | ICD-10-CM | POA: Diagnosis present

## 2020-07-21 DIAGNOSIS — M109 Gout, unspecified: Secondary | ICD-10-CM | POA: Diagnosis present

## 2020-07-21 DIAGNOSIS — E669 Obesity, unspecified: Secondary | ICD-10-CM

## 2020-07-21 DIAGNOSIS — R5381 Other malaise: Secondary | ICD-10-CM | POA: Diagnosis present

## 2020-07-21 DIAGNOSIS — G8191 Hemiplegia, unspecified affecting right dominant side: Secondary | ICD-10-CM | POA: Diagnosis not present

## 2020-07-21 DIAGNOSIS — Z880 Allergy status to penicillin: Secondary | ICD-10-CM

## 2020-07-21 DIAGNOSIS — R519 Headache, unspecified: Secondary | ICD-10-CM | POA: Diagnosis present

## 2020-07-21 DIAGNOSIS — N1832 Chronic kidney disease, stage 3b: Secondary | ICD-10-CM | POA: Diagnosis present

## 2020-07-21 DIAGNOSIS — Z961 Presence of intraocular lens: Secondary | ICD-10-CM | POA: Diagnosis present

## 2020-07-21 DIAGNOSIS — E1169 Type 2 diabetes mellitus with other specified complication: Secondary | ICD-10-CM

## 2020-07-21 DIAGNOSIS — I639 Cerebral infarction, unspecified: Secondary | ICD-10-CM

## 2020-07-21 DIAGNOSIS — N182 Chronic kidney disease, stage 2 (mild): Secondary | ICD-10-CM

## 2020-07-21 DIAGNOSIS — R42 Dizziness and giddiness: Secondary | ICD-10-CM | POA: Diagnosis not present

## 2020-07-21 DIAGNOSIS — I6340 Cerebral infarction due to embolism of unspecified cerebral artery: Secondary | ICD-10-CM | POA: Diagnosis not present

## 2020-07-21 DIAGNOSIS — E785 Hyperlipidemia, unspecified: Secondary | ICD-10-CM | POA: Diagnosis present

## 2020-07-21 DIAGNOSIS — Z9841 Cataract extraction status, right eye: Secondary | ICD-10-CM

## 2020-07-21 DIAGNOSIS — R29810 Facial weakness: Secondary | ICD-10-CM | POA: Diagnosis not present

## 2020-07-21 DIAGNOSIS — H532 Diplopia: Secondary | ICD-10-CM | POA: Diagnosis not present

## 2020-07-21 DIAGNOSIS — Z794 Long term (current) use of insulin: Secondary | ICD-10-CM

## 2020-07-21 DIAGNOSIS — Z833 Family history of diabetes mellitus: Secondary | ICD-10-CM

## 2020-07-21 DIAGNOSIS — H4902 Third [oculomotor] nerve palsy, left eye: Secondary | ICD-10-CM | POA: Diagnosis present

## 2020-07-21 DIAGNOSIS — R4701 Aphasia: Secondary | ICD-10-CM | POA: Diagnosis present

## 2020-07-21 DIAGNOSIS — Z888 Allergy status to other drugs, medicaments and biological substances status: Secondary | ICD-10-CM

## 2020-07-21 DIAGNOSIS — E1122 Type 2 diabetes mellitus with diabetic chronic kidney disease: Secondary | ICD-10-CM | POA: Diagnosis present

## 2020-07-21 DIAGNOSIS — I129 Hypertensive chronic kidney disease with stage 1 through stage 4 chronic kidney disease, or unspecified chronic kidney disease: Secondary | ICD-10-CM | POA: Diagnosis present

## 2020-07-21 DIAGNOSIS — I10 Essential (primary) hypertension: Secondary | ICD-10-CM

## 2020-07-21 DIAGNOSIS — M21371 Foot drop, right foot: Secondary | ICD-10-CM | POA: Diagnosis present

## 2020-07-21 DIAGNOSIS — Z8673 Personal history of transient ischemic attack (TIA), and cerebral infarction without residual deficits: Secondary | ICD-10-CM

## 2020-07-21 DIAGNOSIS — R001 Bradycardia, unspecified: Secondary | ICD-10-CM | POA: Diagnosis present

## 2020-07-21 DIAGNOSIS — G4730 Sleep apnea, unspecified: Secondary | ICD-10-CM | POA: Diagnosis present

## 2020-07-21 DIAGNOSIS — Z79899 Other long term (current) drug therapy: Secondary | ICD-10-CM

## 2020-07-21 DIAGNOSIS — Z882 Allergy status to sulfonamides status: Secondary | ICD-10-CM

## 2020-07-21 DIAGNOSIS — R29703 NIHSS score 3: Secondary | ICD-10-CM | POA: Diagnosis not present

## 2020-07-21 DIAGNOSIS — Z7902 Long term (current) use of antithrombotics/antiplatelets: Secondary | ICD-10-CM

## 2020-07-21 DIAGNOSIS — Z951 Presence of aortocoronary bypass graft: Secondary | ICD-10-CM

## 2020-07-21 DIAGNOSIS — R29702 NIHSS score 2: Secondary | ICD-10-CM | POA: Diagnosis present

## 2020-07-21 DIAGNOSIS — Z881 Allergy status to other antibiotic agents status: Secondary | ICD-10-CM

## 2020-07-21 DIAGNOSIS — R29701 NIHSS score 1: Secondary | ICD-10-CM | POA: Diagnosis not present

## 2020-07-21 DIAGNOSIS — Z87442 Personal history of urinary calculi: Secondary | ICD-10-CM

## 2020-07-21 DIAGNOSIS — E1165 Type 2 diabetes mellitus with hyperglycemia: Secondary | ICD-10-CM | POA: Diagnosis present

## 2020-07-21 DIAGNOSIS — R482 Apraxia: Secondary | ICD-10-CM | POA: Diagnosis present

## 2020-07-21 LAB — CBC
HCT: 40.6 % (ref 39.0–52.0)
Hemoglobin: 14.2 g/dL (ref 13.0–17.0)
MCH: 31.2 pg (ref 26.0–34.0)
MCHC: 35 g/dL (ref 30.0–36.0)
MCV: 89.2 fL (ref 80.0–100.0)
Platelets: 223 10*3/uL (ref 150–400)
RBC: 4.55 MIL/uL (ref 4.22–5.81)
RDW: 13.2 % (ref 11.5–15.5)
WBC: 8.8 10*3/uL (ref 4.0–10.5)
nRBC: 0 % (ref 0.0–0.2)

## 2020-07-21 LAB — COMPREHENSIVE METABOLIC PANEL
ALT: 18 U/L (ref 0–44)
AST: 19 U/L (ref 15–41)
Albumin: 3.2 g/dL — ABNORMAL LOW (ref 3.5–5.0)
Alkaline Phosphatase: 119 U/L (ref 38–126)
Anion gap: 7 (ref 5–15)
BUN: 26 mg/dL — ABNORMAL HIGH (ref 8–23)
CO2: 23 mmol/L (ref 22–32)
Calcium: 8.9 mg/dL (ref 8.9–10.3)
Chloride: 109 mmol/L (ref 98–111)
Creatinine, Ser: 1.64 mg/dL — ABNORMAL HIGH (ref 0.61–1.24)
GFR, Estimated: 45 mL/min — ABNORMAL LOW (ref >60)
Glucose, Bld: 148 mg/dL — ABNORMAL HIGH (ref 70–99)
Potassium: 4.3 mmol/L (ref 3.5–5.1)
Sodium: 139 mmol/L (ref 135–145)
Total Bilirubin: 0.7 mg/dL (ref 0.3–1.2)
Total Protein: 5.9 g/dL — ABNORMAL LOW (ref 6.5–8.1)

## 2020-07-21 LAB — DIFFERENTIAL
Abs Immature Granulocytes: 0.02 10*3/uL (ref 0.00–0.07)
Basophils Absolute: 0 10*3/uL (ref 0.0–0.1)
Basophils Relative: 0 %
Eosinophils Absolute: 0.3 10*3/uL (ref 0.0–0.5)
Eosinophils Relative: 3 %
Immature Granulocytes: 0 %
Lymphocytes Relative: 19 %
Lymphs Abs: 1.7 10*3/uL (ref 0.7–4.0)
Monocytes Absolute: 0.6 10*3/uL (ref 0.1–1.0)
Monocytes Relative: 7 %
Neutro Abs: 6.2 10*3/uL (ref 1.7–7.7)
Neutrophils Relative %: 71 %

## 2020-07-21 LAB — RESP PANEL BY RT-PCR (FLU A&B, COVID) ARPGX2
Influenza A by PCR: NEGATIVE
Influenza B by PCR: NEGATIVE
SARS Coronavirus 2 by RT PCR: NEGATIVE

## 2020-07-21 LAB — PROTIME-INR
INR: 0.9 (ref 0.8–1.2)
Prothrombin Time: 11.8 seconds (ref 11.4–15.2)

## 2020-07-21 LAB — CBG MONITORING, ED: Glucose-Capillary: 131 mg/dL — ABNORMAL HIGH (ref 70–99)

## 2020-07-21 LAB — APTT: aPTT: 25 seconds (ref 24–36)

## 2020-07-21 LAB — ETHANOL: Alcohol, Ethyl (B): <10 mg/dL (ref <10)

## 2020-07-21 NOTE — Consult Note (Signed)
[facility] TeleNeurology Consult Services  Stat Consult  Date of Service:   07/21/2020 20:47:49  Diagnosis:     .  H53.2 - Diplopia  Impression: The patient is a 70 year old man with a history of hypertension hyperlipidemia, diabetes, who presents with dizziness and diplopia, felt most likely to represent CVA vs oculomotor palsy or other cranial nerve palsy. Not a candidate for acute stroke interventions. Recommend evaluation of vascular risk factors with MRI brain, lipid panel, HgbA1C. Tolerate permissive hypertension. Continue home antiplatelets.  CT HEAD: Showed No Acute Hemorrhage or Acute Core Infarct Reviewed  Our recommendations are outlined below.  Diagnostic Studies: Recommend MRI brain without contrast  Laboratory Studies: Recommend Lipid panel Hemoglobin A1c  Nursing Recommendations: Telemetry, IV Fluids, avoid dextrose containing fluids, Maintain euglycemia Neuro checks q4 hrs x 24 hrs and then per shift Head of bed 30 degrees  Consultations: Recommend Speech therapy if failed dysphagia screen Physical therapy/Occupational therapy  Disposition: Neurology will follow   Metrics: [facility] Notification Time: 07/21/2020 20:45:07 Stamp Time: 07/21/2020 20:47:49 Callback Response Time: 07/21/2020 20:48:34   ----------------------------------------------------------------------------------------------------  Chief Complaint: dizziness, diplopia  History of Present Illness: Patient is a 70 year old Male.  Patient reports onset of dizziness yesterday morning upon getting up. Denies frank room spinning sensation, described as sense of gait instability, worse with movement, resolves with laying still. Reports nausea, no vomiting. Awoke with dizziness again this morning and this afternoon reports double vision, resolved with testing each eye individually, vertically oriented, not worse with any direction of gaze. No obvious gaze palsy on  my exam, possible left eye ptosis but patient also says he is intentionally keeping left eye closed to help with double vision. Hospitalized 3/22 for transient aphasia felt to be TIA, negative MRI brain. CTA head/neck showed severe stenosis of right proximal P2. TTE showed moderately dilated left atrium, negative TEE.   Past Medical History:     . Hypertension     . Diabetes Mellitus     . Hyperlipidemia     . Coronary Artery Disease     . Stroke     . right eye retinal detachment   Antiplatelet use: Yes ASA 81 mg, Plavix   Examination: BP(148/64), Pulse(59), Blood Glucose(148) 1A: Level of Consciousness - Alert; keenly responsive + 0 1B: Ask Month and Age - Both Questions Right + 0 1C: Blink Eyes & Squeeze Hands - Performs Both Tasks + 0 2: Test Horizontal Extraocular Movements - Normal + 0 3: Test Visual Fields - No Visual Loss + 0 4: Test Facial Palsy (Use Grimace if Obtunded) - Normal symmetry + 0 5A: Test Left Arm Motor Drift - No Drift for 10 Seconds + 0 5B: Test Right Arm Motor Drift - No Drift for 10 Seconds + 0 6A: Test Left Leg Motor Drift - No Drift for 5 Seconds + 0 6B: Test Right Leg Motor Drift - No Drift for 5 Seconds + 0 7: Test Limb Ataxia (FNF/Heel-Shin) - No Ataxia + 0 8: Test Sensation - Normal; No sensory loss + 0 9: Test Language/Aphasia - Normal; No aphasia + 0 10: Test Dysarthria - Normal + 0 11: Test Extinction/Inattention - No abnormality + 0  NIHSS Score: 0     Patient / Family was informed the Neurology Consult would occur via TeleHealth consult by way of interactive audio and video telecommunications and consented to receiving care in this manner.  Patient is being evaluated for possible acute neurologic impairment and high probability  of imminent or life - threatening deterioration.I spent total of 17 minutes providing care to this patient, including time for face to face visit via telemedicine, review of medical records, imaging studies and  discussion of findings with providers, the patient and / or family.   Dr Serita Grammes   [facility] [phone]  Case [id number]

## 2020-07-21 NOTE — ED Triage Notes (Signed)
Patient endorses dizziness yesterday at lunch time with a recurrent episode following dinner. Patient reports he got up this morning and felt that the dizziness had gotten worse. Patient reports he has since developed diplopia.

## 2020-07-21 NOTE — ED Notes (Signed)
Pt transported to CT scan.

## 2020-07-21 NOTE — ED Provider Notes (Signed)
[facility] EMERGENCY DEPT Provider Note   CSN: [id number] Arrival date & time: 07/21/20  2029     History Chief Complaint  Patient presents with  . Dizziness  . Diplopia    Mitchell Lambert is a 70 y.o. male.  HPI     70 year old male with history of CAD status post CABG, hypertension, asthma, type 2 diabetes, CKD stage III, hyperlipidemia, remote right retinal detachment, who presented at the end of March for probable TIA, with severe stenosis of the proximal right P2 segment and MRI without acute abnormality, who presents with concern for dizziness and diplopia.  Noted dizziness yesterday around 11AM, was waxing and waning during the day with episode around 7PM last night. When he woke up this morning the dizziness felt worse, a sensation of being off balance/like things are moving.  Denies weakness or numbness of one side or another.  Has been closing his left eye as it helps with the double vision.  The double vision began around 3PM, they thought it looked like his left face was drooping.  Has had episodes of dizziness in the past but never episodes as persistent as this.  Initially thought it was one of his medications as many have dizziness as a side effect, however when double vision began thought it was something else.  With prior TIA he had difficulty speaking.  Had loop recorder placed by Dr. Stanford Breed in April  Denies any other symptoms no fever, cough, chest pain, dyspnea.     Past Medical History:  Diagnosis Date  . Angina    NO ANGINA SINCE CABG  . Arthritis    right ankle  . Chronic daily headache    "@ least every other day""improved since heart surgery"  . Coronary artery disease    PT STATES HEART DOING WELL SINCE CABG SURGERY - DR. TILLEY IS HIS CARDIOLOGIST  . Diabetes mellitus    Lantus x 10 yrs  . Gout   . History of kidney stones 09-08-12   multiple times with some lithotripsies  . Hyperlipemia   . Hypertension     Patient Active  Problem List   Diagnosis Date Noted  . CVA (cerebral vascular accident) ([place]) 07/21/2020  . 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 Acute-on-chronic kidney injury ([place]) 05/28/2020  . Stroke ([place]) 05/27/2020  . Elevated troponin 01/13/2019  . COVID-19 virus infection 01/13/2019  . S/P CABG (coronary artery bypass graft)   . CAD (coronary artery disease) 04/28/2011  . Insulin dependent type 2 diabetes mellitus, controlled ([place])   . Hyperlipidemia   . Hypertension   . Obesity (BMI 30-39.9)   . Nephrolithiasis, uric acid     Past Surgical History:  Procedure Laterality Date  . BACK SURGERY     microsurgery: spinal stenosis -lumbar  . bone spur  1990's   right great toe; "probably related to gout"  . BUBBLE STUDY  06/17/2020   Procedure: BUBBLE STUDY;  Surgeon: Lelon Perla, MD;  Location: [facility];  Service: Cardiovascular;;  . CARDIAC CATHETERIZATION  04/27/11  . CATARACT EXTRACTION W/ INTRAOCULAR LENS IMPLANT  1990's   right; "lens was replaced twice over 3 months; lens is actually over iris"  . CORONARY ARTERY BYPASS GRAFT  04/30/2011   Procedure: CORONARY ARTERY BYPASS GRAFTING (CABG);  Surgeon: Melrose Nakayama, MD;  Location: [facility];  Service: Open Heart Surgery;  Laterality: N/A;,x4 vessels  . CYSTOSCOPY W/ URETERAL STENT PLACEMENT Right 08/19/2012   Procedure: CYSTOSCOPY WITH RETROGRADE PYELOGRAM/URETERAL STENT  PLACEMENT;  Surgeon: Molli Hazard, MD;  Location: [facility];  Service: Urology;  Laterality: Right;  . CYSTOSCOPY WITH RETROGRADE PYELOGRAM, URETEROSCOPY AND STENT PLACEMENT Left 07/17/2013   Procedure: CYSTOSCOPY, LEFT URETEROSCOPY, BASKET EXTRACTION OF STONE, INSERTION OF LEFT URETERAL STENT, ;  Surgeon: Jorja Loa, MD;  Location: [facility];  Service: Urology;  Laterality: Left;  . CYSTOSCOPY/RETROGRADE/URETEROSCOPY Right 09/12/2012   Procedure: CYSTOSCOPY/RETROGRADE/URETEROSCOPY;  Surgeon: Franchot Gallo, MD;  Location: [facility];  Service: Urology;   Laterality: Right;  . HOLMIUM LASER APPLICATION Right [id number]   Procedure: HOLMIUM LASER APPLICATION;  Surgeon: Franchot Gallo, MD;  Location: [facility];  Service: Urology;  Laterality: Right;  . HOLMIUM LASER APPLICATION Left [id number]   Procedure: HOLMIUM LASER APPLICATION;  Surgeon: Jorja Loa, MD;  Location: [facility];  Service: Urology;  Laterality: Left;  . KIDNEY STONE SURGERY     "probably 3 times"  . LEFT HEART CATHETERIZATION WITH CORONARY ANGIOGRAM N/A 04/28/2011   Procedure: LEFT HEART CATHETERIZATION WITH CORONARY ANGIOGRAM;  Surgeon: Jacolyn Reedy, MD;  Location: [facility];  Service: Cardiovascular;  Laterality: N/A;  . LITHOTRIPSY     "many; probably 5 times"  . RADIAL ARTERY HARVEST  04/30/2011   Procedure: RADIAL ARTERY HARVEST;  Surgeon: Melrose Nakayama, MD;  Location: [facility];  Service: Open Heart Surgery;  Laterality: Left;  . RETINAL DETACHMENT SURGERY  1990's   right eye; "made me have an early cataract"  . TEE WITHOUT CARDIOVERSION N/A 06/17/2020   Procedure: TRANSESOPHAGEAL ECHOCARDIOGRAM (TEE);  Surgeon: Lelon Perla, MD;  Location: [facility] ENDOSCOPY;  Service: Cardiovascular;  Laterality: N/A;  . VASECTOMY         Family History  Problem Relation Age of Onset  . Cataracts Father   . Cataracts Mother   . Diabetes Paternal Aunt   . Diabetes Paternal Grandmother   . Amblyopia Neg Hx   . Blindness Neg Hx   . Glaucoma Neg Hx   . Macular degeneration Neg Hx   . Retinal detachment Neg Hx   . Strabismus Neg Hx   . Retinitis pigmentosa Neg Hx     Social History   Tobacco Use  . Smoking status: Never Smoker  . Smokeless tobacco: Never Used  Vaping Use  . Vaping Use: Never used  Substance Use Topics  . Alcohol use: Yes    Comment:  OCCAS BEER  OR MIXED DRINK LAST MARIJUANA COUPLE MONTHS AGO  . Drug use: Yes    Types: Marijuana    Comment: last marijuana use ~ 2010; "very rare". since    Home Medications Prior to Admission medications    Medication Sig Start Date End Date Taking? Authorizing Provider  ACCU-CHEK GUIDE test strip USE TO TEST BLOOD SUGAR 2 TO 3 TIMES A DAY 03/18/17   [provider]  Alirocumab (PRALUENT) 150 MG/ML SOAJ Inject 150 mg into the skin every 14 (fourteen) days. 07/08/20   Vickie Epley, MD  allopurinol (ZYLOPRIM) 300 MG tablet Take 300 mg by mouth daily.    [provider]  amLODipine (NORVASC) 5 MG tablet TAKE 1 TABLET(5 MG) BY MOUTH DAILY 07/04/20   Lorretta Harp, MD  atorvastatin (LIPITOR) 40 MG tablet Take 40 mg by mouth daily.    [provider]  B-D ULTRAFINE III SHORT PEN 31G X 8 MM MISC USE UTD WITH FLEXPEN. 03/18/17   [provider]  Biotin 5000 MCG TABS Take 5,000 mcg by mouth daily.  [provider]  bisoprolol (ZEBETA) 10 MG tablet Take 10 mg by mouth daily. 05/29/20   [provider]  clopidogrel (PLAVIX) 75 MG tablet Take 1 tablet (75 mg total) by mouth daily. 05/29/20   Aline August, MD  fish oil-omega-3 fatty acids 1000 MG capsule Take 1 g by mouth 2 (two) times daily.    [provider]  glucosamine-chondroitin 500-400 MG tablet Take 2 tablets by mouth daily.    [provider]  insulin aspart (NOVOLOG FLEXPEN) 100 UNIT/ML FlexPen Inject 10-15 Units into the skin as needed for high blood sugar.    [provider]  Insulin Glargine (BASAGLAR KWIKPEN) 100 UNIT/ML SOPN Inject 100 Units into the skin in the morning. In AM    [provider]  Ipratropium-Albuterol (COMBIVENT) 20-100 MCG/ACT AERS respimat Inhale 1 puff into the lungs every 6 (six) hours as needed for wheezing. 01/15/19 03/16/19  Amin, Jeanella Flattery, MD  irbesartan (AVAPRO) 300 MG tablet Take 1 tablet (300 mg total) by mouth daily. 06/10/20   Lorretta Harp, MD  LORazepam (ATIVAN) 0.5 MG tablet Take 0.5 mg by mouth daily as needed for anxiety. 10/27/18   [provider]  magnesium oxide (MAG-OX) 400 MG tablet Take 400 mg by  mouth 2 (two) times daily.     [provider]  Multiple Vitamins-Minerals (MULTIVITAMIN WITH MINERALS) tablet Take 1 tablet by mouth daily.    [provider]  nitroGLYCERIN (NITROSTAT) 0.4 MG SL tablet Place 0.4 mg under the tongue as needed for chest pain. 12/13/18   [provider]  omeprazole (PRILOSEC) 20 MG capsule Take 20 mg by mouth as needed (acid reflux). 10/26/18   [provider]  tamsulosin (FLOMAX) 0.4 MG CAPS capsule Take 0.4 mg by mouth as needed ("for kidney stone issues"). 11/01/18   [provider]  traMADol (ULTRAM) 50 MG tablet Take 50 mg by mouth as needed for moderate pain.    [provider]  VENTOLIN HFA 108 (90 Base) MCG/ACT inhaler Inhale 2 puffs into the lungs as needed for wheezing or shortness of breath.    [provider]    Allergies    Gabapentin, Metformin and related, Sulfa antibiotics, Azithromycin, Ciprofloxacin, Lisinopril, Losartan potassium, Olmesartan, Simvastatin, Amoxicillin, Doxycycline, and Penicillins  Review of Systems   Review of Systems  Constitutional: Negative for fever.  HENT: Negative for sore throat.   Eyes: Positive for visual disturbance.  Respiratory: Negative for shortness of breath.   Cardiovascular: Negative for chest pain.  Gastrointestinal: Negative for abdominal pain, diarrhea, nausea and vomiting.  Genitourinary: Negative for difficulty urinating.  Musculoskeletal: Negative for back pain and neck stiffness.  Skin: Negative for rash.  Neurological: Positive for dizziness. Negative for syncope, speech difficulty, weakness, numbness and headaches.    Physical Exam Updated Vital Signs BP (!) 178/65   Pulse (!) 50   Temp 98.3 F (36.8 C) (Oral)   Resp 16   Ht 6' (1.829 m)   Wt 108.9 kg   SpO2 97%   BMI 32.55 kg/m   Physical Exam Vitals and nursing note reviewed.  Constitutional:      General: He is not in acute distress.    Appearance: He is  well-developed. He is not diaphoretic.  HENT:     Head: Normocephalic and atraumatic.  Eyes:     General: No visual field deficit.    Conjunctiva/sclera: Conjunctivae normal.  Cardiovascular:     Rate and Rhythm: Normal rate and regular rhythm.  Heart sounds: Normal heart sounds. No murmur heard. No friction rub. No gallop.   Pulmonary:     Effort: Pulmonary effort is normal. No respiratory distress.     Breath sounds: Normal breath sounds. No wheezing or rales.  Abdominal:     General: There is no distension.     Palpations: Abdomen is soft.     Tenderness: There is no abdominal tenderness. There is no guarding.  Musculoskeletal:     Cervical back: Normal range of motion.  Skin:    General: Skin is warm and dry.  Neurological:     Mental Status: He is alert and oriented to person, place, and time.     GCS: GCS eye subscore is 4. GCS verbal subscore is 5. GCS motor subscore is 6.     Cranial Nerves: No dysarthria. Facial asymmetry: has decreased nasolabial fold on right but family reports left appears to be drooping more,  left eye does not fully abduct.     Sensory: Sensation is intact. No sensory deficit.     Motor: No weakness. Pronator drift: question slight pronation but strength appears equal with active testing.     ED Results / Procedures / Treatments   Labs (all labs ordered are listed, but only abnormal results are displayed) Labs Reviewed  COMPREHENSIVE METABOLIC PANEL - Abnormal; Notable for the following components:      Result Value   Glucose, Bld 148 (*)    BUN 26 (*)    Creatinine, Ser 1.64 (*)    Total Protein 5.9 (*)    Albumin 3.2 (*)    GFR, Estimated 45 (*)    All other components within normal limits  CBG MONITORING, ED - Abnormal; Notable for the following components:   Glucose-Capillary 131 (*)    All other components within normal limits  RESP PANEL BY RT-PCR (FLU A&B, COVID) ARPGX2  CBC  ETHANOL  PROTIME-INR  APTT  DIFFERENTIAL   URINALYSIS, ROUTINE W REFLEX MICROSCOPIC  RAPID URINE DRUG SCREEN, HOSP PERFORMED  CBG MONITORING, ED    EKG EKG Interpretation  Date/Time:  Sunday Jul 21 2020 21:27:51 EDT Ventricular Rate:  51 PR Interval:  140 QRS Duration: 104 QT Interval:  425 QTC Calculation: 392 R Axis:   35 Text Interpretation: Sinus rhythm ST abnormality aVL more significant than prior Confirmed by Gareth Morgan [phone]) on 07/22/2020 12:16:35 AM   Radiology CT HEAD WO CONTRAST  Result Date: 07/21/2020 CLINICAL DATA:  Diplopia EXAM: CT HEAD WITHOUT CONTRAST TECHNIQUE: Contiguous axial images were obtained from the base of the skull through the vertex without intravenous contrast. COMPARISON:  05/27/2020 FINDINGS: Brain: Cavum septum pellucidum. Parenchymal volume loss is commensurate with the patient's age. Mild periventricular white matter changes are present likely reflecting the sequela of small vessel ischemia. Remote left parietal subcortical white matter infarct and punctate left thalamic infarct again noted. No abnormal intra or extra-axial mass lesion or fluid collection. No abnormal mass effect or midline shift. No evidence of acute intracranial hemorrhage or infarct. Ventricular size is normal. Cerebellum unremarkable. Vascular: No asymmetric hyperdense vasculature at the skull base. Skull: Intact Sinuses/Orbits: Moderate mucosal thickening involving the right maxillary sinus without air-fluid level. Remaining paranasal sinuses are clear. Orbits are unremarkable. Other: Mastoid air cells and middle ear cavities are clear. IMPRESSION: No acute intracranial hemorrhage or infarct. Stable senescent changes and remote infarcts. Unchanged moderate right maxillary sinus disease. Electronically Signed   By: Fidela Salisbury MD   On: 07/21/2020 22:15  Procedures .Critical Care Performed by: Gareth Morgan, MD Authorized by: Gareth Morgan, MD   Critical care provider statement:    Critical care time  (minutes):  30   Critical care was time spent personally by me on the following activities:  Discussions with consultants, examination of patient, ordering and review of laboratory studies, ordering and review of radiographic studies, pulse oximetry, re-evaluation of patient's condition, obtaining history from patient or surrogate and review of old charts     Medications Ordered in ED Medications - No data to display  ED Course  I have reviewed the triage vital signs and the nursing notes.  Pertinent labs & imaging results that were available during my care of the patient were reviewed by me and considered in my medical decision making (see chart for details).    MDM Rules/Calculators/A&P               NIH Stroke Scale: 2           70 year old male with history of CAD status post CABG, hypertension, asthma, type 2 diabetes, CKD stage III, hyperlipidemia, remote right retinal detachment, who presented at the end of March for probable TIA, with severe stenosis of the proximal right P2 segment and MRI without acute abnormality, who presents with concern for dizziness and diplopia.  Reports LNW truly 11AM yesterday (although dizziness/waxing and waning did not feel back to baseline) which is more than 24hr and based on history and exam he does not appear to be a candidate for tPA or neurointervention. Diplopia also started greater than 5 hours PTA.  Concern on history and exam for CVA.  Consulted teleneurology who evaluated patient. Plan to admit for further CVA evaluation to [facility].    Final Clinical Impression(s) / ED Diagnoses Final diagnoses:  Cerebrovascular accident (CVA), unspecified mechanism ([place])    Rx / DC Orders ED Discharge Orders    None       Gareth Morgan, MD 07/22/20 0020

## 2020-07-22 DIAGNOSIS — Z882 Allergy status to sulfonamides status: Secondary | ICD-10-CM | POA: Diagnosis not present

## 2020-07-22 DIAGNOSIS — R001 Bradycardia, unspecified: Secondary | ICD-10-CM | POA: Diagnosis present

## 2020-07-22 DIAGNOSIS — Z7902 Long term (current) use of antithrombotics/antiplatelets: Secondary | ICD-10-CM | POA: Diagnosis not present

## 2020-07-22 DIAGNOSIS — M10071 Idiopathic gout, right ankle and foot: Secondary | ICD-10-CM | POA: Diagnosis not present

## 2020-07-22 DIAGNOSIS — H02402 Unspecified ptosis of left eyelid: Secondary | ICD-10-CM | POA: Diagnosis present

## 2020-07-22 DIAGNOSIS — Z880 Allergy status to penicillin: Secondary | ICD-10-CM | POA: Diagnosis not present

## 2020-07-22 DIAGNOSIS — I6300 Cerebral infarction due to thrombosis of unspecified precerebral artery: Secondary | ICD-10-CM | POA: Diagnosis not present

## 2020-07-22 DIAGNOSIS — E1165 Type 2 diabetes mellitus with hyperglycemia: Secondary | ICD-10-CM | POA: Diagnosis present

## 2020-07-22 DIAGNOSIS — I639 Cerebral infarction, unspecified: Secondary | ICD-10-CM | POA: Diagnosis not present

## 2020-07-22 DIAGNOSIS — H532 Diplopia: Secondary | ICD-10-CM | POA: Diagnosis present

## 2020-07-22 DIAGNOSIS — Z8673 Personal history of transient ischemic attack (TIA), and cerebral infarction without residual deficits: Secondary | ICD-10-CM | POA: Diagnosis not present

## 2020-07-22 DIAGNOSIS — R482 Apraxia: Secondary | ICD-10-CM | POA: Diagnosis present

## 2020-07-22 DIAGNOSIS — M21371 Foot drop, right foot: Secondary | ICD-10-CM | POA: Diagnosis present

## 2020-07-22 DIAGNOSIS — I2510 Atherosclerotic heart disease of native coronary artery without angina pectoris: Secondary | ICD-10-CM | POA: Diagnosis present

## 2020-07-22 DIAGNOSIS — I69319 Unspecified symptoms and signs involving cognitive functions following cerebral infarction: Secondary | ICD-10-CM | POA: Diagnosis not present

## 2020-07-22 DIAGNOSIS — E785 Hyperlipidemia, unspecified: Secondary | ICD-10-CM | POA: Diagnosis not present

## 2020-07-22 DIAGNOSIS — E1169 Type 2 diabetes mellitus with other specified complication: Secondary | ICD-10-CM | POA: Diagnosis not present

## 2020-07-22 DIAGNOSIS — R29701 NIHSS score 1: Secondary | ICD-10-CM | POA: Diagnosis not present

## 2020-07-22 DIAGNOSIS — Z794 Long term (current) use of insulin: Secondary | ICD-10-CM | POA: Diagnosis not present

## 2020-07-22 DIAGNOSIS — I25810 Atherosclerosis of coronary artery bypass graft(s) without angina pectoris: Secondary | ICD-10-CM | POA: Diagnosis not present

## 2020-07-22 DIAGNOSIS — Z881 Allergy status to other antibiotic agents status: Secondary | ICD-10-CM | POA: Diagnosis not present

## 2020-07-22 DIAGNOSIS — M109 Gout, unspecified: Secondary | ICD-10-CM | POA: Diagnosis present

## 2020-07-22 DIAGNOSIS — E782 Mixed hyperlipidemia: Secondary | ICD-10-CM | POA: Diagnosis not present

## 2020-07-22 DIAGNOSIS — E669 Obesity, unspecified: Secondary | ICD-10-CM | POA: Diagnosis not present

## 2020-07-22 DIAGNOSIS — R29702 NIHSS score 2: Secondary | ICD-10-CM | POA: Diagnosis present

## 2020-07-22 DIAGNOSIS — R4701 Aphasia: Secondary | ICD-10-CM | POA: Diagnosis present

## 2020-07-22 DIAGNOSIS — I6340 Cerebral infarction due to embolism of unspecified cerebral artery: Secondary | ICD-10-CM | POA: Diagnosis present

## 2020-07-22 DIAGNOSIS — I69393 Ataxia following cerebral infarction: Secondary | ICD-10-CM | POA: Diagnosis not present

## 2020-07-22 DIAGNOSIS — I69351 Hemiplegia and hemiparesis following cerebral infarction affecting right dominant side: Secondary | ICD-10-CM | POA: Diagnosis not present

## 2020-07-22 DIAGNOSIS — R29810 Facial weakness: Secondary | ICD-10-CM | POA: Diagnosis present

## 2020-07-22 DIAGNOSIS — I69390 Apraxia following cerebral infarction: Secondary | ICD-10-CM | POA: Diagnosis not present

## 2020-07-22 DIAGNOSIS — I129 Hypertensive chronic kidney disease with stage 1 through stage 4 chronic kidney disease, or unspecified chronic kidney disease: Secondary | ICD-10-CM | POA: Diagnosis present

## 2020-07-22 DIAGNOSIS — Z951 Presence of aortocoronary bypass graft: Secondary | ICD-10-CM | POA: Diagnosis not present

## 2020-07-22 DIAGNOSIS — I10 Essential (primary) hypertension: Secondary | ICD-10-CM | POA: Diagnosis not present

## 2020-07-22 DIAGNOSIS — I69320 Aphasia following cerebral infarction: Secondary | ICD-10-CM | POA: Diagnosis not present

## 2020-07-22 DIAGNOSIS — R5381 Other malaise: Secondary | ICD-10-CM | POA: Diagnosis present

## 2020-07-22 DIAGNOSIS — R29703 NIHSS score 3: Secondary | ICD-10-CM | POA: Diagnosis not present

## 2020-07-22 DIAGNOSIS — Z6831 Body mass index (BMI) 31.0-31.9, adult: Secondary | ICD-10-CM | POA: Diagnosis not present

## 2020-07-22 DIAGNOSIS — Z20822 Contact with and (suspected) exposure to covid-19: Secondary | ICD-10-CM | POA: Diagnosis present

## 2020-07-22 DIAGNOSIS — E1122 Type 2 diabetes mellitus with diabetic chronic kidney disease: Secondary | ICD-10-CM | POA: Diagnosis present

## 2020-07-22 DIAGNOSIS — N1832 Chronic kidney disease, stage 3b: Secondary | ICD-10-CM | POA: Diagnosis present

## 2020-07-22 DIAGNOSIS — Z79899 Other long term (current) drug therapy: Secondary | ICD-10-CM | POA: Diagnosis not present

## 2020-07-22 DIAGNOSIS — N182 Chronic kidney disease, stage 2 (mild): Secondary | ICD-10-CM | POA: Diagnosis not present

## 2020-07-22 DIAGNOSIS — E11649 Type 2 diabetes mellitus with hypoglycemia without coma: Secondary | ICD-10-CM | POA: Diagnosis not present

## 2020-07-22 DIAGNOSIS — G8191 Hemiplegia, unspecified affecting right dominant side: Secondary | ICD-10-CM | POA: Diagnosis present

## 2020-07-22 DIAGNOSIS — I69392 Facial weakness following cerebral infarction: Secondary | ICD-10-CM | POA: Diagnosis not present

## 2020-07-22 LAB — GLUCOSE, CAPILLARY
Glucose-Capillary: 110 mg/dL — ABNORMAL HIGH (ref 70–99)
Glucose-Capillary: 231 mg/dL — ABNORMAL HIGH (ref 70–99)
Glucose-Capillary: 159 mg/dL — ABNORMAL HIGH (ref 70–99)

## 2020-07-22 LAB — URINALYSIS, ROUTINE W REFLEX MICROSCOPIC
Bilirubin Urine: NEGATIVE
Glucose, UA: NEGATIVE mg/dL
Hgb urine dipstick: NEGATIVE
Ketones, ur: NEGATIVE mg/dL
Leukocytes,Ua: NEGATIVE
Nitrite: NEGATIVE
Protein, ur: >300 mg/dL — AB
Specific Gravity, Urine: 1.018 (ref 1.005–1.030)
pH: 7 (ref 5.0–8.0)

## 2020-07-22 LAB — RAPID URINE DRUG SCREEN, HOSP PERFORMED
Amphetamines: NOT DETECTED
Barbiturates: NOT DETECTED
Benzodiazepines: NOT DETECTED
Cocaine: NOT DETECTED
Opiates: NOT DETECTED
Tetrahydrocannabinol: NOT DETECTED

## 2020-07-22 LAB — CBG MONITORING, ED: Glucose-Capillary: 136 mg/dL — ABNORMAL HIGH (ref 70–99)

## 2020-07-22 MED ORDER — LORAZEPAM 0.5 MG PO TABS: 0.5000 mg | ORAL_TABLET | Freq: Every day | ORAL | Status: DC | PRN

## 2020-07-22 MED ORDER — BISOPROLOL FUMARATE 10 MG PO TABS: 10.0000 mg | ORAL_TABLET | Freq: Every day | ORAL | Status: DC

## 2020-07-22 MED ORDER — ADULT MULTIVITAMIN W/MINERALS CH
1.0000 | ORAL_TABLET | Freq: Every day | ORAL | Status: DC
  Administered 2020-07-22 – 2020-07-25 (×4): 1 via ORAL
  Filled 2020-07-22 (×4): qty 1

## 2020-07-22 MED ORDER — ACETAMINOPHEN 650 MG RE SUPP: 650.0000 mg | RECTAL | Status: DC | PRN

## 2020-07-22 MED ORDER — CLOPIDOGREL BISULFATE 75 MG PO TABS
75.0000 mg | ORAL_TABLET | Freq: Every day | ORAL | Status: DC
  Administered 2020-07-22 – 2020-07-24 (×3): 75 mg via ORAL
  Filled 2020-07-22 (×3): qty 1

## 2020-07-22 MED ORDER — LORAZEPAM 2 MG/ML IJ SOLN
1.0000 mg | Freq: Four times a day (QID) | INTRAMUSCULAR | Status: AC | PRN
  Administered 2020-07-23: 1 mg via INTRAVENOUS
  Filled 2020-07-22: qty 1

## 2020-07-22 MED ORDER — ALLOPURINOL 100 MG PO TABS
300.0000 mg | ORAL_TABLET | Freq: Every day | ORAL | Status: DC
  Administered 2020-07-22 – 2020-07-25 (×4): 300 mg via ORAL
  Filled 2020-07-22 (×4): qty 3

## 2020-07-22 MED ORDER — MAGNESIUM OXIDE -MG SUPPLEMENT 400 (240 MG) MG PO TABS
400.0000 mg | ORAL_TABLET | Freq: Two times a day (BID) | ORAL | Status: DC
  Administered 2020-07-22 – 2020-07-25 (×6): 400 mg via ORAL
  Filled 2020-07-22 (×7): qty 1

## 2020-07-22 MED ORDER — SENNOSIDES-DOCUSATE SODIUM 8.6-50 MG PO TABS: 1.0000 | ORAL_TABLET | Freq: Every evening | ORAL | Status: DC | PRN

## 2020-07-22 MED ORDER — ENOXAPARIN SODIUM 40 MG/0.4ML IJ SOSY
40.0000 mg | PREFILLED_SYRINGE | INTRAMUSCULAR | Status: DC
  Administered 2020-07-22 – 2020-07-24 (×3): 40 mg via SUBCUTANEOUS
  Filled 2020-07-22 (×3): qty 0.4

## 2020-07-22 MED ORDER — ACETAMINOPHEN 160 MG/5ML PO SOLN
650.0000 mg | ORAL | Status: DC | PRN
Start: 2020-07-22 — End: 2020-07-25

## 2020-07-22 MED ORDER — OMEGA-3-ACID ETHYL ESTERS 1 G PO CAPS
1.0000 g | ORAL_CAPSULE | Freq: Two times a day (BID) | ORAL | Status: DC
  Administered 2020-07-22 – 2020-07-25 (×6): 1 g via ORAL
  Filled 2020-07-22 (×6): qty 1

## 2020-07-22 MED ORDER — INSULIN ASPART 100 UNIT/ML IJ SOLN
0.0000 [IU] | Freq: Three times a day (TID) | INTRAMUSCULAR | Status: DC
  Administered 2020-07-22: 5 [IU] via SUBCUTANEOUS
  Administered 2020-07-23 (×2): 3 [IU] via SUBCUTANEOUS
  Administered 2020-07-23: 2 [IU] via SUBCUTANEOUS
  Administered 2020-07-24: 3 [IU] via SUBCUTANEOUS
  Administered 2020-07-25: 2 [IU] via SUBCUTANEOUS

## 2020-07-22 MED ORDER — HYDROXYZINE HCL 25 MG PO TABS
50.0000 mg | ORAL_TABLET | Freq: Once | ORAL | Status: AC
  Administered 2020-07-22: 50 mg via ORAL
  Filled 2020-07-22: qty 2

## 2020-07-22 MED ORDER — ATORVASTATIN CALCIUM 40 MG PO TABS
40.0000 mg | ORAL_TABLET | Freq: Every day | ORAL | Status: DC
  Administered 2020-07-22 – 2020-07-25 (×4): 40 mg via ORAL
  Filled 2020-07-22 (×4): qty 1

## 2020-07-22 MED ORDER — ACETAMINOPHEN 325 MG PO TABS: 650.0000 mg | ORAL_TABLET | ORAL | Status: DC | PRN

## 2020-07-22 MED ORDER — ALBUTEROL SULFATE HFA 108 (90 BASE) MCG/ACT IN AERS: 2.0000 | INHALATION_SPRAY | RESPIRATORY_TRACT | Status: DC | PRN

## 2020-07-22 MED ORDER — HYDRALAZINE HCL 25 MG PO TABS: 25.0000 mg | ORAL_TABLET | Freq: Four times a day (QID) | ORAL | Status: DC | PRN

## 2020-07-22 MED ORDER — IPRATROPIUM-ALBUTEROL 20-100 MCG/ACT IN AERS
1.0000 | INHALATION_SPRAY | Freq: Four times a day (QID) | RESPIRATORY_TRACT | Status: DC | PRN
  Filled 2020-07-22: qty 4

## 2020-07-22 MED ORDER — INSULIN GLARGINE 100 UNIT/ML ~~LOC~~ SOLN
100.0000 [IU] | Freq: Every morning | SUBCUTANEOUS | Status: DC
  Administered 2020-07-23 – 2020-07-24 (×2): 100 [IU] via SUBCUTANEOUS
  Filled 2020-07-22 (×3): qty 1

## 2020-07-22 MED ORDER — TRAMADOL HCL 50 MG PO TABS
50.0000 mg | ORAL_TABLET | Freq: Four times a day (QID) | ORAL | Status: DC | PRN
  Administered 2020-07-22 – 2020-07-25 (×3): 50 mg via ORAL
  Filled 2020-07-22 (×3): qty 1

## 2020-07-22 MED ORDER — STROKE: EARLY STAGES OF RECOVERY BOOK
Freq: Once | Status: AC
  Filled 2020-07-22: qty 1

## 2020-07-22 MED ORDER — PANTOPRAZOLE SODIUM 40 MG PO TBEC
40.0000 mg | DELAYED_RELEASE_TABLET | Freq: Every day | ORAL | Status: DC
  Administered 2020-07-22 – 2020-07-25 (×4): 40 mg via ORAL
  Filled 2020-07-22 (×4): qty 1

## 2020-07-22 MED ORDER — SODIUM CHLORIDE 0.9 % IV SOLN: INTRAVENOUS | Status: DC

## 2020-07-22 MED ORDER — TAMSULOSIN HCL 0.4 MG PO CAPS: 0.4000 mg | ORAL_CAPSULE | ORAL | Status: DC | PRN

## 2020-07-22 NOTE — Plan of Care (Signed)
  Problem: Education: Goal: Knowledge of disease or condition will improve Outcome: Progressing Goal: Knowledge of secondary prevention will improve Outcome: Progressing Goal: Knowledge of patient specific risk factors addressed and post discharge goals established will improve Outcome: Progressing   Problem: Coping: Goal: Will verbalize positive feelings about self Outcome: Progressing Goal: Will identify appropriate support needs Outcome: Progressing   

## 2020-07-22 NOTE — Progress Notes (Signed)
Mitchell Lambert is a 70 y.o. male patient admitted from [facility]  ED. Patient is awake, alert  & orientated  X 4,  Full Code, VSS - Blood pressure (!) 143/67, pulse (!) 52, temperature 98.8 F (37.1 C), temperature source Oral, resp. rate 20, height 6' (1.829 m), weight 108.9 kg, SpO2 98 %., room air, no c/o shortness of breath, no c/o chest pain, no distress noted. Tele # [phone] placed and pt is currently running: A-fib   IV site WDL: with a transparent dsg that's clean dry and intact.  orientation to unit, room and routine. Information packet given to patient Admission INP armband ID verified with patient, and in place. SR up x 2, fall risk assessment complete with Patient and family verbalizing understanding of risks associated with falls. Pt verbalizes an understanding of how to use the call bell and to call for help before getting out of bed.  Skin, clean-dry- intact without evidence of bruising, or skin tears.   No evidence of skin break down noted on exam.  Will cont to monitor and assist as needed.  Dorris Carnes, RN 07/22/2020 5:33 PM

## 2020-07-22 NOTE — H&P (Signed)
History and Physical    Mitchell Lambert [id number] DOB: 1950-07-19 DOA: 07/21/2020  PCP: Lawerance Cruel, MD (Confirm with patient/family/NH records and if not entered, this has to be entered at [facility] point of entry) Patient coming from: Home  I have personally briefly reviewed patient's old medical records in [place]  Chief Complaint: Double vision, left facial droop  HPI: Mitchell Lambert is a 70 y.o. 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  Patient came back from trip to [place] last week, yesterday morning, patient woke up with double vision blurred patient described that "parial overlapping visions, with a object image on the left eye lower about 1/3 height as compared to right".  Meantime, he started to have feeling of dizziness and nausea but no vomiting, wife also noticed a left-sided facial droop and eyelid droop and called 911.  13-month ago, patient had 1 episode of right-sided vision loss and aphasia and came to [facility] long hospital.  CT showed possible ill-defined hypoattenuation of the left parietal white matter.  CTA showed severe stenosis of proximal right P2 segment.  MRI negative for stroke.  Patient was discharged with Plavix, with the diagnosis of TIA.  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 A new lens was implanted on the right eye but the image was slight larger than the left sided. But over time, "brain correct the mismatch", and he never had problems since.  ED Course: CT head negative for acute findings, CBC BMP largely within normal limits compared to baseline.  Patient was seen by neurologist  and recommend stroke work-up.  Review of Systems: As per HPI otherwise 14 point review of systems negative.    Past Medical History:  Diagnosis Date  . Angina    NO ANGINA SINCE CABG  . Arthritis    right ankle  . Chronic daily headache    "@ least every other day""improved since heart surgery"  . Coronary artery disease    PT STATES HEART DOING WELL SINCE CABG SURGERY - DR. TILLEY IS HIS CARDIOLOGIST  . Diabetes mellitus    Lantus x 10 yrs  . Gout   . History of kidney stones 09-08-12   multiple times with some lithotripsies  . Hyperlipemia   . Hypertension     Past Surgical History:  Procedure Laterality Date  . BACK SURGERY     microsurgery: spinal stenosis -lumbar  . bone spur  1990's   right great toe; "probably related to gout"  . BUBBLE STUDY  06/17/2020   Procedure: BUBBLE STUDY;  Surgeon: Lelon Perla, MD;  Location: [place];  Service: Cardiovascular;;  . CARDIAC CATHETERIZATION  04/27/11  . CATARACT EXTRACTION W/ INTRAOCULAR LENS IMPLANT  1990's   right; "lens was replaced twice over 3 months; lens is actually over iris"  . CORONARY ARTERY BYPASS GRAFT  04/30/2011   Procedure: CORONARY ARTERY BYPASS GRAFTING (CABG);  Surgeon: Melrose Nakayama, MD;  Location: [place];  Service: Open Heart Surgery;  Laterality: N/A;,x4 vessels  . CYSTOSCOPY W/ URETERAL STENT PLACEMENT Right 08/19/2012   Procedure: CYSTOSCOPY WITH RETROGRADE PYELOGRAM/URETERAL STENT PLACEMENT;  Surgeon: Molli Hazard, MD;  Location: [facility];  Service: Urology;  Laterality: Right;  . CYSTOSCOPY WITH RETROGRADE PYELOGRAM, URETEROSCOPY AND STENT PLACEMENT Left 07/17/2013   Procedure: CYSTOSCOPY, LEFT URETEROSCOPY, BASKET EXTRACTION OF STONE, INSERTION OF LEFT URETERAL STENT, ;  Surgeon: Jorja Loa, MD;  Location[facility];  Service: Urology;  Laterality: Left;  . CYSTOSCOPY/RETROGRADE/URETEROSCOPY Right 09/12/2012   Procedure: CYSTOSCOPY/RETROGRADE/URETEROSCOPY;  Surgeon: Franchot Gallo, MD;  Location[facility];  Service: Urology;  Laterality: Right;  . HOLMIUM LASER APPLICATION Right 05/23/4008   Procedure: HOLMIUM LASER APPLICATION;  Surgeon: Franchot Gallo, MD;  Location[facility];  Service: Urology;  Laterality: Right;  . HOLMIUM LASER APPLICATION Left 2/72/5366   Procedure: HOLMIUM LASER APPLICATION;  Surgeon: Jorja Loa, MD;  Location[facility];  Service: Urology;  Laterality: Left;  . KIDNEY STONE SURGERY     "probably 3 times"  . LEFT HEART CATHETERIZATION WITH CORONARY ANGIOGRAM N/A 04/28/2011   Procedure: LEFT HEART CATHETERIZATION WITH CORONARY ANGIOGRAM;  Surgeon: Jacolyn Reedy, MD;  Location: [facility] C[facility] LAB;  Service: Cardiovascular;  Laterality: N/A;  . LITHOTRIPSY     "many; probably 5 times"  . RADIAL ARTERY HARVEST  04/30/2011   Procedure: RADIAL ARTERY HARVEST;  Surgeon: Melrose Nakayama, MD;  Location: [place];  Service: Open Heart Surgery;  Laterality: Left;  . RETINAL DETACHMENT SURGERY  1990's   right eye; "made me have an early cataract"  . TEE WITHOUT CARDIOVERSION N/A 06/17/2020   Procedure: TRANSESOPHAGEAL ECHOCARDIOGRAM (TEE);  Surgeon: Lelon Perla, MD;  Location: [facility] ENDOSCOPY;  Service: Cardiovascular;  Laterality: N/A;  . VASECTOMY       reports that he has never smoked. He has never used smokeless tobacco. He reports current alcohol use. He reports current drug use. Drug: Marijuana.  Allergies  Allergen Reactions  . Gabapentin Other (See Comments)    "made me so dizzy I missed work for 2 days"  . Metformin And Related Shortness Of Breath  . Sulfa Antibiotics Shortness Of Breath and Rash  . Azithromycin Other (See Comments)    not good for heart condition  . Ciprofloxacin Other (See Comments)    tendon rupture  . Lisinopril Cough  . Losartan Potassium Other (See Comments)    hypotension  . Olmesartan Cough  . Simvastatin Cough  . Amoxicillin Rash and Other (See Comments)    rash Did it involve swelling of the  face/tongue/throat, SOB, or low BP? N Did it involve sudden or severe rash/hives, skin peeling, or any reaction on the inside of your mouth or nose?  Y Did you need to seek medical attention at a hospital or doctor's office? N When did it last happen?"years and years ago" If all above answers are "NO", may proceed with cephalosporin use.   Marland Kitchen Doxycycline Rash  . Penicillins Rash    Family History  Problem Relation Age of Onset  . Cataracts Father   . Cataracts Mother   . Diabetes Paternal Aunt   . Diabetes Paternal Grandmother   . Amblyopia Neg Hx   . Blindness Neg Hx   . Glaucoma Neg Hx   . Macular degeneration Neg Hx   . Retinal detachment Neg Hx   . Strabismus Neg Hx   . Retinitis pigmentosa Neg Hx      Prior to Admission medications   Medication Sig Start Date End Date Taking? Authorizing Provider  ACCU-CHEK GUIDE test strip USE TO TEST BLOOD SUGAR 2 TO 3 TIMES A DAY 03/18/17   [provider]  Alirocumab (PRALUENT) 150 MG/ML SOAJ Inject 150 mg into the skin every 14 (fourteen) days. 07/08/20   Vickie Epley, MD  allopurinol (ZYLOPRIM) 300 MG tablet Take 300 mg by mouth daily.    [provider]  amLODipine (NORVASC) 5 MG tablet TAKE 1 TABLET(5 MG) BY MOUTH DAILY 07/04/20   Lorretta Harp, MD  atorvastatin (LIPITOR) 40 MG tablet Take 40 mg by mouth daily.    [provider]  B-D ULTRAFINE III SHORT PEN 31G X 8 MM MISC USE UTD WITH FLEXPEN. 03/18/17   [provider]  Biotin 5000 MCG TABS Take 5,000 mcg by mouth daily.     [provider]  bisoprolol (ZEBETA) 10 MG tablet Take 10 mg by mouth daily. 05/29/20   [provider]  clopidogrel (PLAVIX) 75 MG tablet Take 1 tablet (75 mg total) by mouth daily. 05/29/20   Aline August, MD  fish oil-omega-3 fatty acids 1000 MG capsule Take 1 g by mouth 2 (two) times daily.    [provider]  glucosamine-chondroitin 500-400 MG tablet Take 2 tablets by mouth daily.     [provider]  insulin aspart (NOVOLOG FLEXPEN) 100 UNIT/ML FlexPen Inject 10-15 Units into the skin as needed for high blood sugar.    [provider]  Insulin Glargine (BASAGLAR KWIKPEN) 100 UNIT/ML SOPN Inject 100 Units into the skin in the morning. In AM    [provider]  Ipratropium-Albuterol (COMBIVENT) 20-100 MCG/ACT AERS respimat Inhale 1 puff into the lungs every 6 (six) hours as needed for wheezing. 01/15/19 03/16/19  Amin, Jeanella Flattery, MD  irbesartan (AVAPRO) 300 MG tablet Take 1 tablet (300 mg total) by mouth daily. 06/10/20   Lorretta Harp, MD  LORazepam (ATIVAN) 0.5 MG tablet Take 0.5 mg by mouth daily as needed for anxiety. 10/27/18   [provider]  magnesium oxide (MAG-OX) 400 MG tablet Take 400 mg by mouth 2 (two) times daily.     [provider]  Multiple Vitamins-Minerals (MULTIVITAMIN WITH MINERALS) tablet Take 1 tablet by mouth daily.    [provider]  nitroGLYCERIN (NITROSTAT) 0.4 MG SL tablet Place 0.4 mg under the tongue as needed for chest pain. 12/13/18   [provider]  omeprazole (PRILOSEC) 20 MG capsule Take 20 mg by mouth as needed (acid reflux). 10/26/18   [provider]  tamsulosin (FLOMAX) 0.4 MG CAPS capsule Take 0.4 mg by mouth as needed ("for kidney stone issues"). 11/01/18   [provider]  traMADol (ULTRAM) 50 MG tablet Take 50 mg by mouth as needed for moderate pain.    [provider]  VENTOLIN HFA 108 (90 Base) MCG/ACT inhaler Inhale 2 puffs into the lungs as needed for wheezing or shortness of breath.    [provider]    Physical Exam: Vitals:   07/22/20 0830 07/22/20 0900 07/22/20 1100 07/22/20 1228  BP: (!) 161/75 (!) 152/69 (!) 163/72 (!) 160/73  Pulse: (!) 57 71 (!) 53 69  Resp: 18 20 17 16   Temp:    97.7 F (36.5 C)  TempSrc:    Oral  SpO2: 99% 100% 97% 98%  Weight:      Height:        Constitutional: NAD, calm, comfortable Vitals:    07/22/20 0830 07/22/20 0900 07/22/20 1100 07/22/20 1228  BP: (!) 161/75 (!) 152/69 (!) 163/72 (!) 160/73  Pulse: (!) 57 71 (!) 53 69  Resp: 18 20 17  16  Temp:    97.7 F (36.5 C)  TempSrc:    Oral  SpO2: 99% 100% 97% 98%  Weight:      Height:       Eyes: PERRL, lids and conjunctivae normal ENMT: Mucous membranes are moist. Posterior pharynx clear of any exudate or lesions.Normal dentition.  Neck: normal, supple, no masses, no thyromegaly Respiratory: clear to auscultation bilaterally, no wheezing, no crackles. Normal respiratory effort. No accessory muscle use.  Cardiovascular: Regular rate and rhythm, no murmurs / rubs / gallops. No extremity edema. 2+ pedal pulses. No carotid bruits.  Abdomen: no tenderness, no masses palpated. No hepatosplenomegaly. Bowel sounds positive.  Musculoskeletal: no clubbing / cyanosis. No joint deformity upper and lower extremities. Good ROM, no contractures. Normal muscle tone.  Skin: no rashes, lesions, ulcers. No induration Neurologic: Left facial droop and eyelid droop, tongue deviation to the right side, decreased light touch sensation of the left face, decreased of skin fold left side forehead..  Vision wise, peripheral vision and other visual field intact, PERRL.  Sensation intact, DTR normal. Strength 5/5 in all 4.  Psychiatric: Normal judgment and insight. Alert and oriented x 3. Normal mood.     Labs on Admission: I have personally reviewed following labs and imaging studies  CBC: Recent Labs  Lab 07/21/20 2101  WBC 8.8  NEUTROABS 6.2  HGB 14.2  HCT 40.6  MCV 89.2  PLT [id number]   Basic Metabolic Panel: Recent Labs  Lab 07/21/20 2101  NA 139  K 4.3  CL 109  CO2 23  GLUCOSE 148*  BUN 26*  CREATININE 1.64*  CALCIUM 8.9   GFR: Estimated Creatinine Clearance: 53.4 mL/min (A) (by C-G formula based on SCr of 1.64 mg/dL (H)). Liver Function Tests: Recent Labs  Lab 07/21/20 2101  AST 19  ALT 18  ALKPHOS 119  BILITOT 0.7  PROT  5.9*  ALBUMIN 3.2*   No results for input(s): LIPASE, AMYLASE in the last 168 hours. No results for input(s): AMMONIA in the last 168 hours. Coagulation Profile: Recent Labs  Lab 07/21/20 2101  INR 0.9   Cardiac Enzymes: No results for input(s): CKTOTAL, CKMB, CKMBINDEX, TROPONINI in the last 168 hours. BNP (last 3 results) No results for input(s): PROBNP in the last 8760 hours. HbA1C: No results for input(s): HGBA1C in the last 72 hours. CBG: Recent Labs  Lab 07/21/20 2037 07/22/20 1019 07/22/20 1246  GLUCAP 131* 136* 110*   Lipid Profile: No results for input(s): CHOL, HDL, LDLCALC, TRIG, CHOLHDL, LDLDIRECT in the last 72 hours. Thyroid Function Tests: No results for input(s): TSH, T4TOTAL, FREET4, T3FREE, THYROIDAB in the last 72 hours. Anemia Panel: No results for input(s): VITAMINB12, FOLATE, FERRITIN, TIBC, IRON, RETICCTPCT in the last 72 hours. Urine analysis:    Component Value Date/Time   COLORURINE YELLOW 07/22/2020 0105   APPEARANCEUR CLEAR 07/22/2020 0105   LABSPEC 1.018 07/22/2020 0105   PHURINE 7.0 07/22/2020 0105   GLUCOSEU NEGATIVE 07/22/2020 0105   HGBUR NEGATIVE 07/22/2020 0105   BILIRUBINUR NEGATIVE 07/22/2020 0105   BILIRUBINUR negative 03/31/2015 1723   BILIRUBINUR neg 08/16/2012 0912   KETONESUR NEGATIVE 07/22/2020 0105   PROTEINUR >300 (A) 07/22/2020 0105   UROBILINOGEN 0.2 03/31/2015 1723   UROBILINOGEN 0.2 04/29/2011 0004   NITRITE NEGATIVE 07/22/2020 0105   LEUKOCYTESUR NEGATIVE 07/22/2020 0105    Radiological Exams on Admission: CT HEAD WO CONTRAST  Result Date: 07/21/2020 CLINICAL DATA:  Diplopia EXAM: CT HEAD WITHOUT CONTRAST TECHNIQUE: Contiguous axial images were obtained from  the base of the skull through the vertex without intravenous contrast. COMPARISON:  05/27/2020 FINDINGS: Brain: Cavum septum pellucidum. Parenchymal volume loss is commensurate with the patient's age. Mild periventricular white matter changes are present  likely reflecting the sequela of small vessel ischemia. Remote left parietal subcortical white matter infarct and punctate left thalamic infarct again noted. No abnormal intra or extra-axial mass lesion or fluid collection. No abnormal mass effect or midline shift. No evidence of acute intracranial hemorrhage or infarct. Ventricular size is normal. Cerebellum unremarkable. Vascular: No asymmetric hyperdense vasculature at the skull base. Skull: Intact Sinuses/Orbits: Moderate mucosal thickening involving the right maxillary sinus without air-fluid level. Remaining paranasal sinuses are clear. Orbits are unremarkable. Other: Mastoid air cells and middle ear cavities are clear. IMPRESSION: No acute intracranial hemorrhage or infarct. Stable senescent changes and remote infarcts. Unchanged moderate right maxillary sinus disease. Electronically Signed   By: Fidela Salisbury MD   On: 07/21/2020 22:15    EKG: Independently reviewed.  Sinus bradycardia  Assessment/Plan Active Problems:   CVA (cerebral vascular accident) ([place])  (please populate well all problems here in Problem List. (For example, if patient is on BP meds at home and you resume or decide to hold them, it is a problem that needs to be her. Same for CAD, COPD, HLD and so on)  CVA vs Bell's palsy -CVA work-up, MRI ordered.  Patient does have history of recent TIA and other risk for carotid which increases risk of CVA. -For exam once, patient does have left facial droop, eyelid droop, probably 3rd nerve palsy, however patient denied any symptoms of URI last week.  But he did have a [place].  Trial of steroid and acyclovir if MRI negative.  Sinus bradycardia -Symptomatic chronic, discontinue beta-blocker  HTN -Allow permissive hypertension  IDDM -Continue Lantus and sliding scale  HDL -Repatha every 2 weeks  CKD stage II -Stable  DVT prophylaxis: Lovenox Code Status: Full Code Family Communication: Wife at  bedside Disposition Plan: Expect 1-2 days hospital stay Consults called: Neural Admission status: Tele admit   Lequita Halt MD [facility] Pager [phone]  07/22/2020, 1:15 PM

## 2020-07-22 NOTE — ED Notes (Signed)
Pt awaken from sleep to perform neuro check. Pt without pain or new complaints. A/Ox4. NIH consistent with EDPs assessment on initial presentation of questionable pronator drift on the R side. R pronator drift is clearly observed at this time. Will continue to monitor.

## 2020-07-22 NOTE — Evaluation (Signed)
Physical Therapy Evaluation Patient Details Name: Mitchell Lambert MRN: [id number] DOB: 07-17-1950 Today's Date: 07/22/2020   History of Present Illness  70 y/o male presented to [facility] ED for concern of dizziness and diplopia. CT head negative for acute abnormality. Complete workup still pending. PMH: CAD s/p CABG, HTN, asthma, type 2 diabetes, CKD stage III, HLD, remote R retinal detachment, HTN, TIA in 04/2020  Clinical Impression  PTA, patient lives with wife and independent and working full time. Patient currently presents with impaired coordination, R LE weakness, impaired balance, diplopia. Patient ambulated with no AD and min guard up to minA due to LOB to R. Educated patient on fall risk and need for supervision for mobility, patient and wife verbalized understanding. Patient will benefit from skilled PT services during acute stay to address listed deficits. Recommend OPPT following discharge to address balance and strength deficits.     Follow Up Recommendations Outpatient PT;Supervision for mobility/OOB    Equipment Recommendations  None recommended by PT    Recommendations for Other Services       Precautions / Restrictions Precautions Precautions: Fall Precaution Comments: diploplia and dizziness Restrictions Weight Bearing Restrictions: No      Mobility  Bed Mobility Overal bed mobility: Modified Independent                  Transfers Overall transfer level: Needs assistance Equipment used: None Transfers: Sit to/from Stand Sit to Stand: Min guard         General transfer comment: min guard for safety  Ambulation/Gait Ambulation/Gait assistance: Min guard;Min assist Gait Distance (Feet): 60 Feet Assistive device: None Gait Pattern/deviations: Step-through pattern;Decreased stride length;Drifts right/left;Wide base of support   Gait velocity interpretation: >2.62 ft/sec, indicative of community ambulatory General Gait Details: LOB x1 to R  requiring minA to recover. Drifitng L/R throughout. Patient reports mild dizziness during ambulation  Stairs            Wheelchair Mobility    Modified Rankin (Stroke Patients Only) Modified Rankin (Stroke Patients Only) Pre-Morbid Rankin Score: No symptoms Modified Rankin: Moderately severe disability     Balance Overall balance assessment: Mild deficits observed, not formally tested                                           Pertinent Vitals/Pain Pain Assessment: No/denies pain    Home Living Family/patient expects to be discharged to:: Private residence Living Arrangements: Spouse/significant other Available Help at Discharge: Family Type of Home: House Home Access: Stairs to enter   Technical brewer of Steps: 5 Home Layout: Two level Home Equipment: None      Prior Function Level of Independence: Independent         Comments: 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     Journalist, newspaper        Extremity/Trunk Assessment   Upper Extremity Assessment Upper Extremity Assessment: Defer to OT evaluation    Lower Extremity Assessment Lower Extremity Assessment: RLE deficits/detail RLE Deficits / Details: R LE grossly 4/5 RLE Coordination: decreased fine motor    Cervical / Trunk Assessment Cervical / Trunk Assessment: Normal  Communication   Communication: No difficulties  Cognition Arousal/Alertness: Awake/alert Behavior During Therapy: WFL for tasks assessed/performed Overall Cognitive Status: Within Functional Limits for tasks assessed  General Comments      Exercises     Assessment/Plan    PT Assessment Patient needs continued PT services  PT Problem List Decreased strength;Decreased activity tolerance;Decreased balance;Decreased mobility;Decreased coordination       PT Treatment Interventions DME instruction;Gait training;Stair  training;Functional mobility training;Therapeutic activities;Therapeutic exercise;Balance training;Neuromuscular re-education;Patient/family education    PT Goals (Current goals can be found in the Care Plan section)  Acute Rehab PT Goals Patient Stated Goal: to go home PT Goal Formulation: With patient Time For Goal Achievement: 08/05/20 Potential to Achieve Goals: Good    Frequency Min 4X/week   Barriers to discharge        Co-evaluation               AM-PAC PT "6 Clicks" Mobility  Outcome Measure Help needed turning from your back to your side while in a flat bed without using bedrails?: None Help needed moving from lying on your back to sitting on the side of a flat bed without using bedrails?: None Help needed moving to and from a bed to a chair (including a wheelchair)?: A Little Help needed standing up from a chair using your arms (e.g., wheelchair or bedside chair)?: A Little Help needed to walk in hospital room?: A Little Help needed climbing 3-5 steps with a railing? : A Little 6 Click Score: 20    End of Session Equipment Utilized During Treatment: Gait belt Activity Tolerance: Patient tolerated treatment well Patient left: in bed;with call bell/phone within reach;with family/visitor present Nurse Communication: Mobility status PT Visit Diagnosis: Unsteadiness on feet (R26.81);Muscle weakness (generalized) (M62.81)    Time: 0175-1025 PT Time Calculation (min) (ACUTE ONLY): 16 min   Charges:   PT Evaluation $PT Eval Low Complexity: 1 Low          Tomica Arseneault A. Gilford Rile PT, DPT Acute Rehabilitation Services Pager [phone] Office [phone]   Linna Hoff 07/22/2020, 1:34 PM

## 2020-07-22 NOTE — Plan of Care (Signed)
  Problem: Education: Goal: Knowledge of disease or condition will improve Outcome: Progressing Goal: Knowledge of secondary prevention will improve Outcome: Progressing Goal: Knowledge of patient specific risk factors addressed and post discharge goals established will improve Outcome: Progressing   Problem: Coping: Goal: Will verbalize positive feelings about self Outcome: Progressing Goal: Will identify appropriate support needs Outcome: Progressing   Problem: Ischemic Stroke/TIA Tissue Perfusion: Goal: Complications of ischemic stroke/TIA will be minimized Outcome: Progressing   

## 2020-07-23 ENCOUNTER — Telehealth: Payer: Self-pay | Admitting: Cardiology

## 2020-07-23 ENCOUNTER — Inpatient Hospital Stay (HOSPITAL_COMMUNITY): Payer: Medicare Other

## 2020-07-23 ENCOUNTER — Encounter (HOSPITAL_COMMUNITY): Payer: PRIVATE HEALTH INSURANCE

## 2020-07-23 DIAGNOSIS — E782 Mixed hyperlipidemia: Secondary | ICD-10-CM

## 2020-07-23 LAB — LIPID PANEL
Cholesterol: 156 mg/dL (ref 0–200)
HDL: 29 mg/dL — ABNORMAL LOW (ref >40)
LDL Cholesterol: 88 mg/dL (ref 0–99)
Total CHOL/HDL Ratio: 5.4 RATIO
Triglycerides: 195 mg/dL — ABNORMAL HIGH (ref <150)
VLDL: 39 mg/dL (ref 0–40)

## 2020-07-23 LAB — GLUCOSE, CAPILLARY
Glucose-Capillary: 130 mg/dL — ABNORMAL HIGH (ref 70–99)
Glucose-Capillary: 160 mg/dL — ABNORMAL HIGH (ref 70–99)
Glucose-Capillary: 174 mg/dL — ABNORMAL HIGH (ref 70–99)
Glucose-Capillary: 144 mg/dL — ABNORMAL HIGH (ref 70–99)

## 2020-07-23 LAB — HEMOGLOBIN A1C
Hgb A1c MFr Bld: 8.5 % — ABNORMAL HIGH (ref 4.8–5.6)
Mean Plasma Glucose: 197.25 mg/dL

## 2020-07-23 LAB — HIV ANTIBODY (ROUTINE TESTING W REFLEX): HIV Screen 4th Generation wRfx: NONREACTIVE

## 2020-07-23 NOTE — Telephone Encounter (Signed)
The patient wife states he is in the hospital and had a stroke. He is not near his phone. I let the patient wife know that his app on the phone act as his monitor. The patient has a Loop so and the last transmission was May 22nd. [facility] will take the last transmission and make its summary report. The patient wife verbalized understanding and thanked me for the call.

## 2020-07-23 NOTE — Progress Notes (Addendum)
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  Patient did have a recent trip to [place] last week and had double vision with mild blurring day prior to presentation.  He also had dizziness and left-sided facial droop and eye droop.  EMS was called in.  Of note almost a month back patient had similar history of right side with loss and aphasia and had been to the hospital where a CT scan showed some ill-defined hypoattenuation over the left parietal white matter.  CTA showed severe stenosis of the proximal P2 segment.  MRI was negative for stroke.  Patient was then discharged with Plavix with a diagnosis of TIA.  On this admission, CT head scan was performed which was negative for acute findings.  Blood work was unremarkable as well.  Patient was seen by neurology and recommended further stroke work-up.   Assessment/Plan:  Principal Problem:   CVA (cerebral vascular accident) [facility]) Active Problems:   Hyperlipidemia   Hypertension  Acute pontomedullary infarcts  History of TIA which puts him at high risk.  MRI of the brain small acute infarcts at the left pontine medullary junction.  Small remote left parietal infarct and chronic lacunar infarct at the left thalamus.  Neurology has been consulted.  Continue Lipitor  Plavix.  Spoke with Dr. Leonie Man for consultation.  Recommended transcranial Doppler and carotid duplex ultrasound.  Sinus bradycardia -Symptomatic chronic, off beta-blocker  Essential hypertension  -Allow permissive hypertension.  Latest blood pressure of  140/68.  Diabetes mellitus type 2. -Continue Lantus and sliding scale insulin.  Closely monitor blood glucose levels.  Latest blood glucose level of 160.  Hyperlipidemia on Repatha every 2 weeks  CKD stage II -Stable.  Check BMP in AM.  Deconditioning debility.  Physical therapy has seen the patient.  Recommend CIR at this time  DVT prophylaxis: enoxaparin (LOVENOX) injection 40 mg Start: 07/22/20 2200   Code Status: Full code  Family Communication: I spoke with the patient's spouse on the phone and updated him about the clinical condition of the patient.   Status is: Inpatient  Remains inpatient appropriate because:Ongoing diagnostic testing needed not appropriate for outpatient work up, IV treatments appropriate due to intensity of illness or inability to take PO and Inpatient level of care appropriate due to severity of illness   Dispo: The patient is from: Home              Anticipated d/c is to: CIR as per PT              Patient currently is not medically stable to d/c.   Difficult to place patient No  Consultants:  Neurology  Procedures:  None  Anti-infectives:  . None  Anti-infectives (From admission, onward)   None     Subjective: Today, patient was seen and examined at bedside.  Complains of dizziness lightheadedness, unsteadiness.  Nursing staff reported that he was dragging his right foot more than the left.  Objective: Vitals:   07/23/20 0400 07/23/20 1155  BP:  140/68  Pulse: (!) 55 (!) 51  Resp:  14  Temp:  98.8 F (37.1 C)  SpO2:  98%    Intake/Output Summary (Last 24 hours) at 07/23/2020 1303 Last data filed at 07/23/2020 0159 Gross per 24 hour  Intake 837.94 ml  Output --  Net 837.94 ml   Filed Weights   07/21/20 2037  Weight: 108.9 kg   Body mass index is 32.55 kg/m.   Physical Exam: GENERAL: Patient is alert awake and oriented. Not in obvious distress.  Obese, HENT: No scleral pallor or icterus. Pupils equally reactive  to light. Oral mucosa is moist NECK: is supple, no gross swelling noted. CHEST: Clear to auscultation. No crackles or wheezes.  Diminished breath sounds bilaterally. CVS: S1 and S2 heard, no murmur. Regular rate and rhythm.  ABDOMEN: Soft, non-tender, bowel sounds are present. EXTREMITIES: No edema. CNS: Left-sided facial droop.  Moving all extremities. SKIN: warm and dry without rashes.  Data Review: I have personally reviewed the following laboratory data and studies,  CBC: Recent Labs  Lab 07/21/20 2101  WBC 8.8  NEUTROABS 6.2  HGB 14.2  HCT 40.6  MCV 89.2  PLT 767   Basic Metabolic Panel: Recent Labs  Lab 07/21/20 2101  NA 139  K 4.3  CL 109  CO2 23  GLUCOSE 148*  BUN 26*  CREATININE 1.64*  CALCIUM 8.9   Liver Function Tests: Recent Labs  Lab 07/21/20 2101  AST 19  ALT 18  ALKPHOS 119  BILITOT 0.7  PROT 5.9*  ALBUMIN 3.2*   No results for input(s): LIPASE, AMYLASE in the last 168 hours. No results for input(s): AMMONIA in the last 168 hours. Cardiac Enzymes: No results for input(s): CKTOTAL, CKMB, CKMBINDEX, TROPONINI in the last 168 hours. BNP (last 3 results) No results for input(s): BNP in the last 8760 hours.  ProBNP (last 3 results) No results for input(s): PROBNP in the last 8760 hours.  CBG: Recent Labs  Lab 07/22/20 1246 07/22/20 1624 07/22/20 2111 07/23/20 0623 07/23/20 1201  GLUCAP 110* 231* 159* 130* 160*   Recent Results (from the past 240 hour(s))  Resp Panel by RT-PCR (Flu A&B, Covid) Nasopharyngeal Swab     Status: None   Collection Time: 07/21/20  9:01 PM   Specimen: Nasopharyngeal Swab; Nasopharyngeal(NP) swabs in vial transport medium  Result Value Ref Range Status   SARS Coronavirus 2 by RT PCR NEGATIVE NEGATIVE Final    Comment: (NOTE) SARS-CoV-2 target nucleic acids are NOT DETECTED.  The SARS-CoV-2 RNA is generally detectable in upper respiratory specimens during the acute phase of infection. The  lowest concentration of SARS-CoV-2 viral copies this assay can detect is 138 copies/mL. A negative result does not preclude SARS-Cov-2 infection and should not be used as the sole basis for treatment or other patient management decisions. A negative result may occur with  improper specimen collection/handling, submission of specimen other than nasopharyngeal swab, presence of viral mutation(s) within the areas targeted by this assay, and inadequate number of viral copies(<138 copies/mL). A negative result must be combined with clinical observations, patient history, and epidemiological information. The expected result is Negative.  Fact Sheet for Patients:  EntrepreneurPulse.com.au  Fact Sheet for Healthcare Providers:  IncredibleEmployment.be  This test is no t yet approved or cleared by the [place] FDA and  has been authorized for detection and/or diagnosis of SARS-CoV-2 by FDA under an Emergency Use Authorization (EUA). This EUA will remain  in effect (meaning this test can be used) for the duration of the COVID-19 declaration under Section 564(b)(1) of the Act,  21 U.S.C.section 360bbb-3(b)(1), unless the authorization is terminated  or revoked sooner.       Influenza A by PCR NEGATIVE NEGATIVE Final   Influenza B by PCR NEGATIVE NEGATIVE Final    Comment: (NOTE) The Xpert Xpress SARS-CoV-2/FLU/RSV plus assay is intended as an aid in the diagnosis of influenza from Nasopharyngeal swab specimens and should not be used as a sole basis for treatment. Nasal washings and aspirates are unacceptable for Xpert Xpress SARS-CoV-2/FLU/RSV testing.  Fact Sheet for Patients: EntrepreneurPulse.com.au  Fact Sheet for Healthcare Providers: IncredibleEmployment.be  This test is not yet approved or cleared by the [place] FDA and has been authorized for detection and/or diagnosis of SARS-CoV-2 by FDA under  an Emergency Use Authorization (EUA). This EUA will remain in effect (meaning this test can be used) for the duration of the COVID-19 declaration under Section 564(b)(1) of the Act, 21 U.S.C. section 360bbb-3(b)(1), unless the authorization is terminated or revoked.  Performed at [facility], [address] [place], [place] [address]      Studies: CT HEAD WO CONTRAST  Result Date: 07/21/2020 CLINICAL DATA:  Diplopia EXAM: CT HEAD WITHOUT CONTRAST TECHNIQUE: Contiguous axial images were obtained from the base of the skull through the vertex without intravenous contrast. COMPARISON:  05/27/2020 FINDINGS: Brain: Cavum septum pellucidum. Parenchymal volume loss is commensurate with the patient's age. Mild periventricular white matter changes are present likely reflecting the sequela of small vessel ischemia. Remote left parietal subcortical white matter infarct and punctate left thalamic infarct again noted. No abnormal intra or extra-axial mass lesion or fluid collection. No abnormal mass effect or midline shift. No evidence of acute intracranial hemorrhage or infarct. Ventricular size is normal. Cerebellum unremarkable. Vascular: No asymmetric hyperdense vasculature at the skull base. Skull: Intact Sinuses/Orbits: Moderate mucosal thickening involving the right maxillary sinus without air-fluid level. Remaining paranasal sinuses are clear. Orbits are unremarkable. Other: Mastoid air cells and middle ear cavities are clear. IMPRESSION: No acute intracranial hemorrhage or infarct. Stable senescent changes and remote infarcts. Unchanged moderate right maxillary sinus disease. Electronically Signed   By: Fidela Salisbury MD   On: 07/21/2020 22:15   MR BRAIN WO CONTRAST  Result Date: 07/23/2020 CLINICAL DATA:  Stroke follow-up EXAM: MRI HEAD WITHOUT CONTRAST TECHNIQUE: Multiplanar, multiecho pulse sequences of the brain and surrounding structures were obtained without intravenous  contrast. COMPARISON:  05/28/2020 FINDINGS: Brain: Restricted diffusion at the left pontomedullary junction and in the left cerebral white matter lateral to the atrium of the lateral ventricle. No acute hemorrhage, hydrocephalus, or collection. Chronic perforator infarct at the left thalamus. Small remote left parietal cortex infarct. Vascular: Preserved flow voids Skull and upper cervical spine: Normal marrow signal Sinuses/Orbits: Progressive, complete opacification of the right maxillary sinus when compared to prior. Postoperative right globe. IMPRESSION: 1. Small acute infarcts at the left pontine medullary junction and left cerebral white matter. 2. Small remote left parietal infarct. Chronic lacunar infarct at the left thalamus. 3. Active right maxillary sinusitis which has progressed from comparison 2 months ago. Electronically Signed   By: Monte Fantasia M.D.   On: 07/23/2020 08:43      Flora Lipps, MD  [facility] 07/23/2020  If 7PM-7AM, please contact night-coverage

## 2020-07-23 NOTE — TOC Initial Note (Signed)
Transition of Care [facility]) - Initial/Assessment Note    Patient Details  Name: Mitchell Lambert MRN: [id number] Date of Birth: 13-Dec-1950  Transition of Care [facility]) CM/SW Contact:    Pollie Friar, RN Phone Number: 07/23/2020, 3:54 PM  Clinical Narrative:                 Patient is from home with his spouse. He states they both work. Pt denies any issues with home transportation or medications. He uses no DME at home.  Recommendations have changed to CIR today.  CM following for d/c needs/ disposition.  Expected Discharge Plan: IP Rehab Facility Barriers to Discharge: Continued Medical Work up   Patient Goals and CMS Choice   CMS Medicare.gov Compare Post Acute Care list provided to:: Patient Choice offered to / list presented to : Patient  Expected Discharge Plan and Services Expected Discharge Plan: [place]   Discharge Planning Services: CM Consult Post Acute Care Choice: IP Rehab Living arrangements for the past 2 months: Single Family Home                                      Prior Living Arrangements/Services Living arrangements for the past 2 months: Single Family Home Lives with:: Spouse Patient language and need for interpreter reviewed:: Yes Do you feel safe going back to the place where you live?: Yes            Criminal Activity/Legal Involvement Pertinent to Current Situation/Hospitalization: No - Comment as needed  Activities of Daily Living      Permission Sought/Granted                  Emotional Assessment Appearance:: Appears stated age Attitude/Demeanor/Rapport: Engaged Affect (typically observed): Accepting Orientation: : Oriented to Self,Oriented to Place,Oriented to  Time,Oriented to Situation   Psych Involvement: No (comment)  Admission diagnosis:  CVA (cerebral vascular accident) ([place]) [I63.9] Cerebrovascular accident (CVA), unspecified mechanism ([place]) [I63.9] Patient Active Problem List   Diagnosis Date Noted   . CVA (cerebral vascular accident) ([place]) 07/21/2020  . Cryptogenic stroke ([place]) 06/18/2020  . Acute-on-chronic kidney injury ([place]) 05/28/2020  . Stroke ([place]) 05/27/2020  . Elevated troponin 01/13/2019  . COVID-19 virus infection 01/13/2019  . S/P CABG (coronary artery bypass graft)   . CAD (coronary artery disease) 04/28/2011  . Insulin dependent type 2 diabetes mellitus, controlled ([place])   . Hyperlipidemia   . Hypertension   . Obesity (BMI 30-39.9)   . Nephrolithiasis, uric acid    PCP:  Lawerance Cruel, MD Pharmacy:   [phone] [place], [place] [address] [place] [address] Phone: [phone] Fax: [place], [place] - [facility] [place] [address] Phone: [phone] Fax: [phone]     Social Determinants of Health (SDOH) Interventions    Readmission Risk Interventions No flowsheet data found.

## 2020-07-23 NOTE — Progress Notes (Addendum)
Physical Therapy Treatment Patient Details Name: Mitchell Lambert MRN: [id number] DOB: [birth year] Today's Date: 07/23/2020    History of Present Illness 70 y/o male presented to [facility] ED for concern of dizziness and diplopia. MRI showed acute L pontine CVA.  PMH: CAD s/p CABG, HTN, asthma, type 2 diabetes, CKD stage III, HLD, remote R retinal detachment, HTN, CVA.    PT Comments    Note change below from outpt PT to CIR for follow up. Pt with decreased balance today compared to eval yesterday with numerous LOB to R and need for HHA with increased distances. He occasionally needed mod A to correct for LOB and needed consistent min A for all mobility. Pt had Ativan this morning for MRI but do not feel that this fully explains his decreased function. His wife also reports that his speech seems worse today than yesterday. Pt continues to have overlapping vision but also with noted RLE ataxia during gait. Pt with confirmed CVA on MRI this AM. Request CIR consult to consider intense rehab before returning home with his wife. PT will continue to follow.  Of note: pt HR after ambulation 56 bpm   Follow Up Recommendations  CIR;Supervision/Assistance - 24 hour     Equipment Recommendations  Other (comment) (TBD)    Recommendations for Other Services Rehab consult     Precautions / Restrictions Precautions Precautions: Fall Precaution Comments: overlapping vision and dizziness Restrictions Weight Bearing Restrictions: No    Mobility  Bed Mobility Overal bed mobility: Modified Independent             General bed mobility comments: pt able to come to EOB without assist but uses predominately L side for pulling self up    Transfers Overall transfer level: Needs assistance Equipment used: None Transfers: Sit to/from Stand Sit to Stand: Min assist         General transfer comment: pt needed min A with each sit>stand due to immediate LOB each time. Pt able to self correct but  needed support on R side while he gained balance  Ambulation/Gait Ambulation/Gait assistance: Min assist Gait Distance (Feet): 145 Feet (15', 15', 100', 15') Assistive device: None;1 person hand held assist Gait Pattern/deviations: Step-through pattern;Decreased stride length;Wide base of support;Ataxic;Staggering right Gait velocity: decreased Gait velocity interpretation: <1.8 ft/sec, indicate of risk for recurrent falls General Gait Details: pt with numerous LOB to R, especially with turning. Pt first closed L eye on his own and had 2-3 R staggers. Next bout he covered L eye with his R and stability was worse, neededing mod A to prevent a fall. With longer distance he again self closed his L eye and was given R sided HHA. Last bout he covered his R eye and balance was actually a bit better with this but he reports everything being smaller due to his past surgery on that eye.   Stairs             Wheelchair Mobility    Modified Rankin (Stroke Patients Only) Modified Rankin (Stroke Patients Only) Pre-Morbid Rankin Score: No symptoms Modified Rankin: Moderately severe disability     Balance Overall balance assessment: Needs assistance Sitting-balance support: No upper extremity supported;Feet supported Sitting balance-Leahy Scale: Good     Standing balance support: No upper extremity supported;During functional activity Standing balance-Leahy Scale: Poor Standing balance comment: pt unable to correct LOB without support and had multiple LOB throughout session  Cognition Arousal/Alertness: Awake/alert Behavior During Therapy: WFL for tasks assessed/performed Overall Cognitive Status: Impaired/Different from baseline Area of Impairment: Safety/judgement;Awareness                         Safety/Judgement: Decreased awareness of deficits Awareness: Emergent   General Comments: Pt with decreased insight into balance  deficits      Exercises      General Comments General comments (skin integrity, edema, etc.): wife present for session and notes that pt's speech is not as good today as it was yesterday      Pertinent Vitals/Pain Pain Assessment: No/denies pain    Home Living                      Prior Function            PT Goals (current goals can now be found in the care plan section) Acute Rehab PT Goals Patient Stated Goal: to go home PT Goal Formulation: With patient Time For Goal Achievement: 08/05/20 Potential to Achieve Goals: Good Progress towards PT goals: Progressing toward goals    Frequency    Min 4X/week      PT Plan Discharge plan needs to be updated    Co-evaluation              AM-PAC PT "6 Clicks" Mobility   Outcome Measure  Help needed turning from your back to your side while in a flat bed without using bedrails?: None Help needed moving from lying on your back to sitting on the side of a flat bed without using bedrails?: A Little Help needed moving to and from a bed to a chair (including a wheelchair)?: A Little Help needed standing up from a chair using your arms (e.g., wheelchair or bedside chair)?: A Little Help needed to walk in hospital room?: A Lot Help needed climbing 3-5 steps with a railing? : A Lot 6 Click Score: 17    End of Session Equipment Utilized During Treatment: Gait belt Activity Tolerance: Patient tolerated treatment well Patient left: in bed;with call bell/phone within reach;with family/visitor present;with bed alarm set Nurse Communication: Mobility status PT Visit Diagnosis: Unsteadiness on feet (R26.81);Muscle weakness (generalized) (M62.81);Ataxic gait (R26.0)     Time: 1062-6948 PT Time Calculation (min) (ACUTE ONLY): 23 min  Charges:  $Gait Training: 23-37 mins                     Leighton Roach, [place]  Pager [phone] Office [place] 07/23/2020, 1:06  PM

## 2020-07-23 NOTE — Progress Notes (Signed)
Occupational Therapy Evaluation Patient Details Name: Mitchell Lambert MRN: [id number] DOB: 24-Mar-1950 Today's Date: 07/23/2020    History of Present Illness 70 y/o male [facility] ED for concern of dizziness and diplopia. MRI showed acute L pontine CVA junction and  left cerebral white matter; remote left parietal infarct. 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 PMH: CAD s/p CABG, HTN, asthma, type 2 diabetes, CKD stage III, HLD, remote R retinal detachment, HTN, CVA.   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 Pt presents with a functional decline, requiring min A with ADL and functional mobility. Pt complaining of horizontal diplopia with L gaze and demonstrates apparent sensory motor deficits with RU/LE, increasing risk of falls. Slow processing noted. At this time recommend rehab at [facility] to maximize functional level of independence and facilitate safe DC home. Will follow acutely.     Follow Up Recommendations  CIR    Equipment Recommendations  3 in 1 bedside commode;Other (comment) (RW)    Recommendations for Other Services Rehab consult     Precautions / Restrictions Precautions Precautions: Fall Precaution Comments: overlapping vision and dizziness Restrictions Weight Bearing Restrictions: No      Mobility Bed Mobility Overal bed mobility: Modified Independent             General bed mobility comments: pt able to come to EOB without assist but required 2 trials and using momentum to achieve sitting    Transfers Overall transfer level: Needs assistance Equipment used: None Transfers: Sit to/from Stand Sit to Stand: Min assist         General transfer comment:  LOB posteriorly    Balance Overall balance assessment: Needs assistance Sitting-balance support: No upper extremity supported;Feet supported Sitting balance-Leahy Scale: Good      Standing balance support: No upper extremity supported;During functional activity Standing balance-Leahy Scale: Poor Standing balance comment: pt unable to correct LOB without support and had multiple LOB throughout session                           ADL either performed or assessed with clinical judgement   ADL Overall ADL's : Needs assistance/impaired Eating/Feeding: Set up Eating/Feeding Details (indicate cue type and reason): dropping utensils with R hand; May benefit form red tubing Grooming: Set up;Supervision/safety;Sitting   Upper Body Bathing: Set up;Sitting   Lower Body Bathing: Sit to/from stand;Minimal assistance   Upper Body Dressing : Set up;Supervision/safety;Sitting   Lower Body Dressing: Minimal assistance;Sit to/from stand   Toilet Transfer: Minimal assistance;Ambulation   Toileting- Clothing Manipulation and Hygiene: Min guard;Sit to/from stand       Functional mobility during ADLs: Minimal assistance (unsteady; posteiror LOB)       Vision Baseline Vision/History: Wears glasses Wears Glasses: Reading only Patient Visual Report: Blurring of vision;Diplopia Vision Assessment?: Yes Eye Alignment: Within Functional Limits Ocular Range of Motion: Restricted on the left;Impaired-to be further tested in functional context Alignment/Gaze Preference: Within Defined Limits Tracking/Visual Pursuits: Decreased smoothness of horizontal tracking;Decreased smoothness of vertical tracking Saccades: Additional head turns occurred during testing Convergence: Within functional limits Visual Fields: No apparent deficits Diplopia Assessment: Disappears with one eye closed;Objects split side to side;Only with left gaze Depth Perception: Overshoots Additional Comments: unable to read with B eyes     Perception Perception Perception Tested?: Yes Comments: decreased attention regarding use of RUE   Praxis  Praxis Praxis-Other Comments: will further assess; ?  sensory motor deficits with RUE    Pertinent Vitals/Pain Pain Assessment: No/denies pain     Hand Dominance Right   Extremity/Trunk Assessment Upper Extremity Assessment Upper Extremity Assessment: RUE deficits/detail RUE Deficits / Details: +drift; weaker distally; grip @ 4/5; not initiating use of RUE at times; appeasr unaware of position of hand; reports "it feels a little different". AROM overall WFL; decreased fine-motor and in0hand amnipulation skills. Pt reports dropping his utensils RUE Sensation: decreased light touch;decreased proprioception RUE Coordination: decreased fine motor   Lower Extremity Assessment Lower Extremity Assessment: Defer to PT evaluation RLE Deficits / Details: R LE grossly 4/5; poor heel to shin   Cervical / Trunk Assessment Cervical / Trunk Assessment: Normal   Communication Communication Communication: Expressive difficulties (dysarthric at times)   Cognition Arousal/Alertness: Awake/alert Behavior During Therapy: WFL for tasks assessed/performed Overall Cognitive Status: Impaired/Different from baseline Area of Impairment: Safety/judgement;Awareness;Attention                   Current Attention Level: Selective     Safety/Judgement: Decreased awareness of deficits;Decreased awareness of safety Awareness: Emergent   General Comments: Pt with decreased insight into balance deficits; R dominanat and not reaching or initiating use of RUE during functional tasks   General Comments  wife concerned that symtoms are worse today. MD notified    Exercises     Shoulder Instructions      Home Living Family/patient expects to be discharged to:: Private residence Living Arrangements: Spouse/significant other Available Help at Discharge: Family;Available 24 hours/day Type of Home: House Home Access: Stairs to enter [employer] of Steps: 5 Entrance Stairs-Rails: None Home Layout: Two level Alternate Level Stairs-Number of  Steps: 12 Alternate Level Stairs-Rails: Left Bathroom Shower/Tub: Teacher, early years/pre: Standard Bathroom Accessibility: Yes How Accessible: Accessible via walker Home Equipment: None          Prior Functioning/Environment Level of Independence: Independent        Comments: 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        OT Problem List: Decreased strength;Decreased activity tolerance;Impaired balance (sitting and/or standing);Impaired vision/perception;Decreased coordination;Decreased cognition;Decreased safety awareness;Decreased knowledge of use of DME or AE;Impaired sensation;Impaired UE functional use      OT Treatment/Interventions: Therapeutic exercise;Self-care/ADL training;Neuromuscular education;DME and/or AE instruction;Therapeutic activities;Cognitive remediation/compensation;Visual/perceptual remediation/compensation;Patient/family education;Balance training    OT Goals(Current goals can be found in the care plan section) Acute Rehab OT Goals Patient Stated Goal: to go home OT Goal Formulation: With patient Time For Goal Achievement: 08/06/20 Potential to Achieve Goals: Good  OT Frequency: Min 2X/week   Barriers to D/C:            Co-evaluation              AM-PAC OT "6 Clicks" Daily Activity     Outcome Measure Help from another person eating meals?: A Little Help from another person taking care of personal grooming?: A Little Help from another person toileting, which includes using toliet, bedpan, or urinal?: A Little Help from another person bathing (including washing, rinsing, drying)?: A Little Help from another person to put on and taking off regular upper body clothing?: A Little Help from another person to put on and taking off regular lower body clothing?: A Little 6 Click Score: 18   End of Session Equipment Utilized During Treatment: Gait belt Nurse Communication: Mobility status  Activity Tolerance: Patient  tolerated treatment well Patient  left: in bed;with call bell/phone within reach;with bed alarm set;with family/visitor present  OT Visit Diagnosis: Unsteadiness on feet (R26.81);Other abnormalities of gait and mobility (R26.89);Muscle weakness (generalized) (M62.81);Low vision, both eyes (H54.2);Other symptoms and signs involving cognitive function;Dizziness and giddiness (R42)                Time: 3354-5625 OT Time Calculation (min): 22 min Charges:  OT General Charges $OT Visit: 1 Visit OT Evaluation $OT Eval Moderate Complexity: [place], OT/L   Acute OT Clinical Specialist Acute Rehabilitation Services Pager [phone] Office [phone]   [facility] 07/23/2020, 1:56 PM

## 2020-07-23 NOTE — Telephone Encounter (Signed)
Patient's wife states the patient is admitted and they got a reminder about his home remote pacer check. She states he was not given a device and had an app installed in his phone. She states they do not know how to send a transmission and he does not have his phone with him in the hospital.

## 2020-07-23 NOTE — Consult Note (Signed)
Physical Medicine and Rehabilitation Consult Reason for Consult: Dizziness and diplopia Referring Physician: Dr.Pokhrel   HPI: Mitchell Lambert is a 70 y.o. 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  History taken from chart review and patient.  Patient lives with spouse independent prior to admission.  Two-level home 5 steps to entry.  He presented on 07/19/2020 with diplopia and dizziness. Cranial CT scan unremarkable for acute intracranial process.  Patient did not receive tPA.  MRI showed small acute infarct at the left pontine medullary junction and left cerebellar white matter.  Small remote left parietal infarct.  Chronic lacunar infarct at the left thalamus.  Admission chemistries unremarkable aside BUN 26, creatinine 1.64, urine drug screen negative, alcohol negative.  Carotid Dopplers pending.  Patient is currently maintained on Plavix for CVA prophylaxis.  Subcutaneous Lovenox for DVT prophylaxis.  Hospital course complicated by bouts of sinus bradycardia that was chronic and currently off any beta-blocker.  Therapy evaluations completed due to patient decreased functional mobility recommendations of physical medicine rehab consult.  Review of Systems  Constitutional: Negative for chills and fever.  HENT: Negative for hearing loss.   Eyes: Positive for double vision.  Respiratory: Negative for cough and shortness of breath.   Cardiovascular: Negative for chest pain and palpitations.  Gastrointestinal: Positive for constipation. Negative for heartburn, nausea and vomiting.  Genitourinary: Negative for dysuria and hematuria.  Musculoskeletal: Positive for joint pain and myalgias.  Skin: Negative for rash.  Neurological: Positive for dizziness, focal weakness, weakness and headaches.  All other systems reviewed and are negative.  Past  Medical History:  Diagnosis Date  . Angina    NO ANGINA SINCE CABG  . Arthritis    right ankle  . Chronic daily headache    "@ least every other day""improved since heart surgery"  . Coronary artery disease    PT STATES HEART DOING WELL SINCE CABG SURGERY - DR. TILLEY IS HIS CARDIOLOGIST  . Diabetes mellitus    Lantus x 10 yrs  . Gout   . History of kidney stones 09-08-12   multiple times with some lithotripsies  . Hyperlipemia   . Hypertension    Past Surgical History:  Procedure Laterality Date  . BACK SURGERY     microsurgery: spinal stenosis -lumbar  . bone spur  1990's   right great toe; "probably related to gout"  . BUBBLE STUDY  06/17/2020   Procedure: BUBBLE STUDY;  Surgeon: Lelon Perla, MD;  Location: [place];  Service: Cardiovascular;;  . CARDIAC CATHETERIZATION  04/27/11  . CATARACT EXTRACTION W/ INTRAOCULAR LENS IMPLANT  1990's   right; "lens was replaced twice over 3 months; lens is actually over iris"  . CORONARY ARTERY BYPASS GRAFT  04/30/2011   Procedure: CORONARY ARTERY BYPASS GRAFTING (CABG);  Surgeon: Melrose Nakayama, MD;  Location: [place];  Service: Open Heart Surgery;  Laterality: N/A;,x4 vessels  . CYSTOSCOPY W/ URETERAL STENT PLACEMENT Right 08/19/2012   Procedure: CYSTOSCOPY WITH RETROGRADE PYELOGRAM/URETERAL STENT PLACEMENT;  Surgeon: Molli Hazard, MD;  Location: [facility];  Service: Urology;  Laterality: Right;  . CYSTOSCOPY WITH RETROGRADE PYELOGRAM, URETEROSCOPY AND STENT PLACEMENT Left 07/17/2013   Procedure: CYSTOSCOPY, LEFT URETEROSCOPY, BASKET EXTRACTION OF STONE, INSERTION OF LEFT URETERAL STENT, ;  Surgeon: Jorja Loa, MD;  Location: [facility];  Service: Urology;  Laterality: Left;  . CYSTOSCOPY/RETROGRADE/URETEROSCOPY  Right 09/12/2012   Procedure: CYSTOSCOPY/RETROGRADE/URETEROSCOPY;  Surgeon: Franchot Gallo, MD;  Location: [facility];  Service: Urology;  Laterality: Right;  . HOLMIUM LASER APPLICATION Right 08/13/4313    Procedure: HOLMIUM LASER APPLICATION;  Surgeon: Franchot Gallo, MD;  Location: [facility];  Service: Urology;  Laterality: Right;  . HOLMIUM LASER APPLICATION Left 4/00/8676   Procedure: HOLMIUM LASER APPLICATION;  Surgeon: Jorja Loa, MD;  Location: [facility];  Service: Urology;  Laterality: Left;  . KIDNEY STONE SURGERY     "probably 3 times"  . LEFT HEART CATHETERIZATION WITH CORONARY ANGIOGRAM N/A 04/28/2011   Procedure: LEFT HEART CATHETERIZATION WITH CORONARY ANGIOGRAM;  Surgeon: Jacolyn Reedy, MD;  Location: [facility] CATH LAB;  Service: Cardiovascular;  Laterality: N/A;  . LITHOTRIPSY     "many; probably 5 times"  . RADIAL ARTERY HARVEST  04/30/2011   Procedure: RADIAL ARTERY HARVEST;  Surgeon: Melrose Nakayama, MD;  Location: [place];  Service: Open Heart Surgery;  Laterality: Left;  . RETINAL DETACHMENT SURGERY  1990's   right eye; "made me have an early cataract"  . TEE WITHOUT CARDIOVERSION N/A 06/17/2020   Procedure: TRANSESOPHAGEAL ECHOCARDIOGRAM (TEE);  Surgeon: Lelon Perla, MD;  Location: [facility] ENDOSCOPY;  Service: Cardiovascular;  Laterality: N/A;  . VASECTOMY     Family History  Problem Relation Age of Onset  . Cataracts Father   . Cataracts Mother   . Diabetes Paternal Aunt   . Diabetes Paternal Grandmother   . Amblyopia Neg Hx   . Blindness Neg Hx   . Glaucoma Neg Hx   . Macular degeneration Neg Hx   . Retinal detachment Neg Hx   . Strabismus Neg Hx   . Retinitis pigmentosa Neg Hx    Social History:  reports that he has never smoked. He has never used smokeless tobacco. He reports current alcohol use. He reports current drug use. Drug: Marijuana. Allergies:  Allergies  Allergen Reactions  . Gabapentin Other (See Comments)    "made me so dizzy I missed work for 2 days"  . Metformin And Related Shortness Of Breath  . Sulfa Antibiotics Shortness Of Breath and Rash  . Azithromycin Other (See Comments)    not good for heart condition  . Ciprofloxacin Other  (See Comments)    tendon rupture  . Lisinopril Cough  . Losartan Potassium Other (See Comments)    hypotension  . Olmesartan Cough  . Simvastatin Cough  . Amoxicillin Rash and Other (See Comments)    rash Did it involve swelling of the face/tongue/throat, SOB, or low BP? N Did it involve sudden or severe rash/hives, skin peeling, or any reaction on the inside of your mouth or nose?  Y Did you need to seek medical attention at a hospital or doctor's office? N When did it last happen?"years and years ago" If all above answers are "NO", may proceed with cephalosporin use.   Marland Kitchen Doxycycline Rash  . Penicillins Rash   Medications Prior to Admission  Medication Sig Dispense Refill  . acetaminophen (TYLENOL) 325 MG tablet Take 325-650 mg by mouth every 6 (six) hours as needed for mild pain, fever or headache.    . allopurinol (ZYLOPRIM) 300 MG tablet Take 300 mg by mouth daily.    Marland Kitchen amLODipine (NORVASC) 5 MG tablet TAKE 1 TABLET(5 MG) BY MOUTH DAILY (Patient taking differently: Take 5 mg by mouth daily.) 90 tablet 3  . atorvastatin (LIPITOR) 40 MG tablet Take 40 mg by mouth daily.    Marland Kitchen  Biotin 5000 MCG TABS Take 5,000 mcg by mouth daily.     . bisoprolol (ZEBETA) 5 MG tablet Take 5 mg by mouth daily.    . clopidogrel (PLAVIX) 75 MG tablet Take 1 tablet (75 mg total) by mouth daily. 30 tablet 0  . fish oil-omega-3 fatty acids 1000 MG capsule Take 1 g by mouth 2 (two) times daily.    Marland Kitchen glucosamine-chondroitin 500-400 MG tablet Take 2 tablets by mouth daily.    Marland Kitchen ibuprofen (ADVIL) 200 MG tablet Take 200 mg by mouth every 6 (six) hours as needed for fever, headache or mild pain.    Marland Kitchen insulin aspart (NOVOLOG FLEXPEN) 100 UNIT/ML FlexPen Inject 10-15 Units into the skin as needed for high blood sugar.    . Insulin Glargine (BASAGLAR KWIKPEN) 100 UNIT/ML SOPN Inject 100 Units into the skin in the morning. In AM    . irbesartan (AVAPRO) 300 MG tablet Take 1 tablet (300 mg total) by mouth daily.  90 tablet 0  . magnesium oxide (MAG-OX) 400 MG tablet Take 400 mg by mouth 2 (two) times daily.     . Multiple Vitamins-Minerals (MULTIVITAMIN WITH MINERALS) tablet Take 1 tablet by mouth daily.    . nitroGLYCERIN (NITROSTAT) 0.4 MG SL tablet Place 0.4 mg under the tongue as needed for chest pain.    Marland Kitchen oxymetazoline (AFRIN) 0.05 % nasal spray Place 1 spray into both nostrils at bedtime as needed for congestion.    . traMADol (ULTRAM) 50 MG tablet Take 50 mg by mouth daily.    Marland Kitchen ACCU-CHEK GUIDE test strip USE TO TEST BLOOD SUGAR 2 TO 3 TIMES A DAY  5  . Alirocumab (PRALUENT) 150 MG/ML SOAJ Inject 150 mg into the skin every 14 (fourteen) days. (Patient not taking: Reported on 07/22/2020) 2 mL 11  . B-D ULTRAFINE III SHORT PEN 31G X 8 MM MISC USE UTD WITH FLEXPEN.  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 Pt able to self correct but needed support on R side while  he gained balance Ambulation/Gait Ambulation/Gait assistance: Min assist Gait Distance (Feet): 145 Feet (15', 15', 100', 15') Assistive device: None,1 person hand held assist Gait Pattern/deviations: Step-through pattern,Decreased stride length,Wide base of support,Ataxic,Staggering right General Gait Details: pt with numerous LOB to R, especially with turning. Pt first closed L eye on his own and had 2-3 R staggers. Next bout he covered L eye with his R and stability was worse, neededing mod A to prevent a fall. With longer distance he again self closed his L eye and was given R sided HHA. Last bout he covered his R eye and  balance was actually a bit better with this but he reports everything being smaller due to his past surgery on that eye. Gait velocity: decreased Gait velocity interpretation: <1.8 ft/sec, indicate of risk for recurrent falls    ADL: ADL Overall ADL's : Needs assistance/impaired Eating/Feeding: Set up Eating/Feeding Details (indicate cue type and reason): dropping utensils with R hand; May benefit form red tubing Grooming: Set up,Supervision/safety,Sitting Upper Body Bathing: Set up,Sitting Lower Body Bathing: Sit to/from stand,Minimal assistance Upper Body Dressing : Set up,Supervision/safety,Sitting Lower Body Dressing: Minimal assistance,Sit to/from stand Toilet Transfer: Minimal assistance,Ambulation Toileting- Clothing Manipulation and Hygiene: Min guard,Sit to/from stand Functional mobility during ADLs: Minimal assistance (unsteady; posteiror LOB)  Cognition: Cognition Overall Cognitive Status: Impaired/Different from baseline Orientation Level: Oriented X4 Cognition Arousal/Alertness: Awake/alert Behavior During Therapy: WFL for tasks assessed/performed Overall Cognitive Status: Impaired/Different from baseline Area of Impairment: Safety/judgement,Awareness,Attention Current Attention Level: Selective Safety/Judgement: Decreased awareness of  deficits,Decreased awareness of safety Awareness: Emergent General Comments: Pt with decreased insight into balance deficits; R dominanat and not reaching or initiating use of RUE during functional tasks  Blood pressure (!) 152/72, pulse (!) 55, temperature 98 F (36.7 C), temperature source Oral, resp. rate 18, height 6' (1.829 m), weight 108.9 kg, SpO2 98 %. Physical Exam Vitals reviewed.  Constitutional:      General: He is not in acute distress.    Appearance: He is obese. He is not ill-appearing.  HENT:     Head: Normocephalic and atraumatic.     Right Ear: External ear normal.     Left Ear: External ear normal.     Nose: Nose normal.  Eyes:     General:        Right eye: No discharge.        Left eye: No discharge.     Extraocular Movements: Extraocular movements intact.  Cardiovascular:     Rate and Rhythm: Normal rate and regular rhythm.  Pulmonary:     Effort: Pulmonary effort is normal. No respiratory distress.     Breath sounds: No stridor.  Abdominal:     General: Abdomen is flat. Bowel sounds are normal. There is no distension.  Musculoskeletal:     Cervical back: Normal range of motion and neck supple.     Comments: No edema or tenderness in extremities  Skin:    General: Skin is warm and dry.  Neurological:     Mental Status: He is alert.     Comments: Alert Provides name and age.   Follows simple commands. Motor: LUE/LLE: 5/5 proximal distal RUE: 3 -/5 proximal distal RLE: 4/5 proximal to distal Sensation intact light touch  Psychiatric:        Mood and Affect: Mood normal.        Behavior: Behavior normal.     Results for orders placed or performed during the hospital encounter of 07/21/20 (from the past 24 hour(s))  Glucose, capillary     Status: Abnormal   Collection Time: 07/23/20  3:50 PM  Result Value Ref Range   Glucose-Capillary 174 (H) 70 - 99 mg/dL  Glucose, capillary     Status: Abnormal   Collection Time: 07/23/20  9:11 PM  Result  Value Ref Range   Glucose-Capillary 144 (H) 70 - 99 mg/dL  Magnesium     Status: None   Collection Time: 07/24/20  3:13 AM  Result Value Ref Range   Magnesium 1.8 1.7 - 2.4 mg/dL  Phosphorus     Status: None  Collection Time: 07/24/20  3:13 AM  Result Value Ref Range   Phosphorus 3.7 2.5 - 4.6 mg/dL  CBC     Status: None   Collection Time: 07/24/20  3:13 AM  Result Value Ref Range   WBC 7.7 4.0 - 10.5 K/uL   RBC 4.26 4.22 - 5.81 MIL/uL   Hemoglobin 13.4 13.0 - 17.0 g/dL   HCT 39.2 39.0 - 52.0 %   MCV 92.0 80.0 - 100.0 fL   MCH 31.5 26.0 - 34.0 pg   MCHC 34.2 30.0 - 36.0 g/dL   RDW 13.2 11.5 - 15.5 %   Platelets 206 150 - 400 K/uL   nRBC 0.0 0.0 - 0.2 %  Basic metabolic panel     Status: Abnormal   Collection Time: 07/24/20  3:13 AM  Result Value Ref Range   Sodium 140 135 - 145 mmol/L   Potassium 4.3 3.5 - 5.1 mmol/L   Chloride 109 98 - 111 mmol/L   CO2 26 22 - 32 mmol/L   Glucose, Bld 104 (H) 70 - 99 mg/dL   BUN 28 (H) 8 - 23 mg/dL   Creatinine, Ser 1.81 (H) 0.61 - 1.24 mg/dL   Calcium 8.8 (L) 8.9 - 10.3 mg/dL   GFR, Estimated 40 (L) >60 mL/min   Anion gap 5 5 - 15  Glucose, capillary     Status: None   Collection Time: 07/24/20  6:38 AM  Result Value Ref Range   Glucose-Capillary 91 70 - 99 mg/dL  Glucose, capillary     Status: Abnormal   Collection Time: 07/24/20 12:40 PM  Result Value Ref Range   Glucose-Capillary 106 (H) 70 - 99 mg/dL   MR BRAIN WO CONTRAST  Result Date: 07/23/2020 CLINICAL DATA:  Stroke follow-up EXAM: MRI HEAD WITHOUT CONTRAST TECHNIQUE: Multiplanar, multiecho pulse sequences of the brain and surrounding structures were obtained without intravenous contrast. COMPARISON:  05/28/2020 FINDINGS: Brain: Restricted diffusion at the left pontomedullary junction and in the left cerebral white matter lateral to the atrium of the lateral ventricle. No acute hemorrhage, hydrocephalus, or collection. Chronic perforator infarct at the left thalamus. Small  remote left parietal cortex infarct. Vascular: Preserved flow voids Skull and upper cervical spine: Normal marrow signal Sinuses/Orbits: Progressive, complete opacification of the right maxillary sinus when compared to prior. Postoperative right globe. IMPRESSION: 1. Small acute infarcts at the left pontine medullary junction and left cerebral white matter. 2. Small remote left parietal infarct. Chronic lacunar infarct at the left thalamus. 3. Active right maxillary sinusitis which has progressed from comparison 2 months ago. Electronically Signed   By: Monte Fantasia M.D.   On: 07/23/2020 08:43   Assessment/Plan: Diagnosis: Small acute infarct at the left pontine medullary junction  Stroke: Continue secondary stroke prophylaxis and Risk Factor Modification listed below:   Antiplatelet therapy:   Blood Pressure Management:  Continue current medication with prn's with permisive HTN per primary team Statin Agent:   Diabetes management:   Right sided hemiparesis:  PT/OT for mobility, ADL training  Motor recovery: Fluoxetine Labs independently reviewed.  Records reviewed and summated above.  1. Does the need for close, 24 hr/day medical supervision in concert with the patient's rehab needs make it unreasonable for this patient to be served in a less intensive setting? Yes\ 2. 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 Due to bladder management, safety, skin/wound care, disease management and patient education, does the patient require 24 hr/day rehab nursing? Yes 4. Does the patient require coordinated care of a physician, rehab nurse, therapy disciplines of PT/OT to address  physical and functional deficits in the context of the above medical diagnosis(es)? Yes Addressing deficits in the following areas: balance, endurance, locomotion, strength, transferring, bathing, dressing, toileting and psychosocial support 5. Can the patient actively participate in an intensive therapy program of at least 3 hrs of therapy per day at least 5 days per week? Yes 6. The potential for patient to make measurable gains while on inpatient rehab is excellent 7. Anticipated functional outcomes upon discharge from inpatient rehab are modified independent  with PT, modified independent with OT, n/a with SLP. 8. Estimated rehab length of stay to reach the above functional goals is: 7-12 days. 9. Anticipated discharge destination: Home 10. Overall Rehab/Functional Prognosis: excellent  RECOMMENDATIONS: This patient's condition is appropriate for continued rehabilitative care in the following setting: CIR Patient has agreed to participate in recommended program. Yes Note that insurance prior authorization may be required for reimbursement for recommended care.  Comment: Rehab Admissions Coordinator to follow up.  I have personally performed a face to face diagnostic evaluation, including, but not limited to relevant history and physical exam findings, of this patient and developed relevant assessment and plan.  Additionally, I have reviewed and concur with the physician assistant's documentation above.   Delice Lesch, MD, ABPMR Lavon Paganini Angiulli, PA-C 07/24/2020

## 2020-07-23 NOTE — Progress Notes (Signed)
Inpatient Rehab Admissions Coordinator Note:   Per therapy recommendations, pt was screened for CIR candidacy by Arminta Gamm, MS CCC-SLP. At this time, Pt. Appears to have functional decline and is a good candidate for CIR. Will place order for rehab consult per protocol.  Please contact me with questions.   Dalayna Lauter, MS, CCC-SLP Rehab Admissions Coordinator  [phone] (celll) [phone] (office)  

## 2020-07-24 ENCOUNTER — Ambulatory Visit (INDEPENDENT_AMBULATORY_CARE_PROVIDER_SITE_OTHER): Payer: Medicare Other

## 2020-07-24 ENCOUNTER — Inpatient Hospital Stay (HOSPITAL_COMMUNITY): Payer: Medicare Other

## 2020-07-24 DIAGNOSIS — E785 Hyperlipidemia, unspecified: Secondary | ICD-10-CM

## 2020-07-24 DIAGNOSIS — E1169 Type 2 diabetes mellitus with other specified complication: Secondary | ICD-10-CM

## 2020-07-24 DIAGNOSIS — I6300 Cerebral infarction due to thrombosis of unspecified precerebral artery: Secondary | ICD-10-CM

## 2020-07-24 DIAGNOSIS — I10 Essential (primary) hypertension: Secondary | ICD-10-CM | POA: Diagnosis not present

## 2020-07-24 DIAGNOSIS — E669 Obesity, unspecified: Secondary | ICD-10-CM

## 2020-07-24 DIAGNOSIS — I25810 Atherosclerosis of coronary artery bypass graft(s) without angina pectoris: Secondary | ICD-10-CM

## 2020-07-24 DIAGNOSIS — I639 Cerebral infarction, unspecified: Secondary | ICD-10-CM | POA: Diagnosis not present

## 2020-07-24 DIAGNOSIS — Z8673 Personal history of transient ischemic attack (TIA), and cerebral infarction without residual deficits: Secondary | ICD-10-CM | POA: Diagnosis not present

## 2020-07-24 DIAGNOSIS — N182 Chronic kidney disease, stage 2 (mild): Secondary | ICD-10-CM | POA: Diagnosis not present

## 2020-07-24 LAB — CUP PACEART REMOTE DEVICE CHECK
Date Time Interrogation Session: 20220523085922
Implantable Pulse Generator Implant Date: 20220419

## 2020-07-24 LAB — PHOSPHORUS: Phosphorus: 3.7 mg/dL (ref 2.5–4.6)

## 2020-07-24 LAB — BASIC METABOLIC PANEL
Anion gap: 5 (ref 5–15)
BUN: 28 mg/dL — ABNORMAL HIGH (ref 8–23)
CO2: 26 mmol/L (ref 22–32)
Calcium: 8.8 mg/dL — ABNORMAL LOW (ref 8.9–10.3)
Chloride: 109 mmol/L (ref 98–111)
Creatinine, Ser: 1.81 mg/dL — ABNORMAL HIGH (ref 0.61–1.24)
GFR, Estimated: 40 mL/min — ABNORMAL LOW (ref >60)
Glucose, Bld: 104 mg/dL — ABNORMAL HIGH (ref 70–99)
Potassium: 4.3 mmol/L (ref 3.5–5.1)
Sodium: 140 mmol/L (ref 135–145)

## 2020-07-24 LAB — GLUCOSE, CAPILLARY
Glucose-Capillary: 91 mg/dL (ref 70–99)
Glucose-Capillary: 106 mg/dL — ABNORMAL HIGH (ref 70–99)
Glucose-Capillary: 185 mg/dL — ABNORMAL HIGH (ref 70–99)
Glucose-Capillary: 138 mg/dL — ABNORMAL HIGH (ref 70–99)

## 2020-07-24 LAB — CBC
HCT: 39.2 % (ref 39.0–52.0)
Hemoglobin: 13.4 g/dL (ref 13.0–17.0)
MCH: 31.5 pg (ref 26.0–34.0)
MCHC: 34.2 g/dL (ref 30.0–36.0)
MCV: 92 fL (ref 80.0–100.0)
Platelets: 206 10*3/uL (ref 150–400)
RBC: 4.26 MIL/uL (ref 4.22–5.81)
RDW: 13.2 % (ref 11.5–15.5)
WBC: 7.7 10*3/uL (ref 4.0–10.5)
nRBC: 0 % (ref 0.0–0.2)

## 2020-07-24 LAB — MAGNESIUM: Magnesium: 1.8 mg/dL (ref 1.7–2.4)

## 2020-07-24 MED ORDER — ZOLPIDEM TARTRATE 5 MG PO TABS
5.0000 mg | ORAL_TABLET | Freq: Every evening | ORAL | Status: DC | PRN
  Administered 2020-07-24: 5 mg via ORAL
  Filled 2020-07-24: qty 1

## 2020-07-24 MED ORDER — ASPIRIN EC 81 MG PO TBEC
81.0000 mg | DELAYED_RELEASE_TABLET | Freq: Every day | ORAL | Status: DC
  Administered 2020-07-24 – 2020-07-25 (×2): 81 mg via ORAL
  Filled 2020-07-24 (×2): qty 1

## 2020-07-24 MED ORDER — TICAGRELOR 90 MG PO TABS
90.0000 mg | ORAL_TABLET | Freq: Two times a day (BID) | ORAL | Status: DC
  Administered 2020-07-25: 90 mg via ORAL
  Filled 2020-07-24: qty 1

## 2020-07-24 MED ORDER — MELATONIN 3 MG PO TABS
3.0000 mg | ORAL_TABLET | Freq: Every day | ORAL | Status: DC
  Administered 2020-07-24: 3 mg via ORAL
  Filled 2020-07-24: qty 1

## 2020-07-24 NOTE — Progress Notes (Addendum)
PROGRESS NOTE    Mitchell Lambert  OHY:[id number] DOB: [birth year] DOA: 07/21/2020 PCP: Lawerance Cruel, MD   Brief Narrative:  HPI on 07/22/2020 by Dr. Wynetta Fines Mitchell Lambert is a 70 y.o. 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  Patient came back from trip to [place] last week, yesterday morning, patient woke up with double vision blurred patient described that "parial overlapping visions, with a object image on the left eye lower about 1/3 height as compared to right".  Meantime, he started to have feeling of dizziness and nausea but no vomiting, wife also noticed a left-sided facial droop and eyelid droop and called 911.  19-month ago, patient had 1 episode of right-sided vision loss and aphasia and came to [facility].  CT showed possible ill-defined hypoattenuation of the left parietal white matter.  CTA showed severe stenosis of proximal right P2 segment.  MRI negative for stroke.  Patient was discharged with Plavix, with the diagnosis of TIA.  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 A new lens was implanted on the right eye but the image was slight larger than the left sided. But over time, "brain correct the mismatch", and he never had problems since. Assessment & Plan   Acute CVA -Patient with history of TIA -MRI brain showed small acute infarcts of the left pontine medullary junction.  Small remote left parietal infarct and chronic lacunar infarcts at the left thalamus -Neurology consulted and appreciated, pending -Continue statin, Plavix -Hemoglobin A1c 8.5, LDL 88 -last echocardiogram 06/17/2020 showed EF  55-60%, no RWMA. -Pending transcranial and carotid dopplers -PT and OT recommending inpatient rehab.  Inpatient rehab consulted  Sinus bradycardia -Symptomatic and chronic.  Currently off of beta-blocker -Continue to monitor  Essential hypertension -Given acute CVA, allowing permissive hypertension  Diabetes mellitus, type II -Hemoglobin A1c 8.8 -Continue insulin sliding scale and CBG monitoring  Hyperlipidemia -Continue Repatha, statin  CKD, stage IIIb -Creatinine appears to be stable, continue to monitor  Deconditioning and debility -Likely secondary to acute CVA -PT and OT recommending CIR  DVT Prophylaxis  lovenox  Code Status: Full  Family Communication: None at bedside  Disposition Plan:  Status is: Inpatient  Remains inpatient appropriate because:Ongoing diagnostic testing needed not appropriate for outpatient work up and Inpatient level of care appropriate due to severity of illness   Dispo: The patient is from: Home              Anticipated d/c is to: CIR              Patient currently is not medically stable to d/c.   Difficult to place patient No   Consultants Neurology Inpatient Rehab  Procedures  Carotid and Transcranial ultrasound  Antibiotics   Anti-infectives (From admission, onward)   None      Subjective:   Mitchell Lambert seen and examined today.  Continues to have some double vision and feels that he gets dizzy when just moving in bed.  Also continues to have right-sided weakness.  Denies current chest pain, shortness of breath, abdominal pain, nausea or vomiting, diarrhea or constipation.   Objective:   Vitals:  07/23/20 2352 07/24/20 0336 07/24/20 0821 07/24/20 1234  BP: (!) 153/70 (!) 149/63 (!) 163/75 (!) 152/72  Pulse: (!) 48 (!) 48 (!) 50 (!) 55  Resp: 19 18 16 18   Temp: 98 F (36.7 C) 97.9 F (36.6 C)  98 F (36.7 C)  TempSrc: Oral Oral  Oral  SpO2: 97% 96% 96% 98%  Weight:      Height:       No intake or output data  in the 24 hours ending 07/24/20 1600 Filed Weights   07/21/20 2037  Weight: 108.9 kg    Exam  General: Well developed, well nourished, NAD, appears stated age  HEENT: NCAT, mucous membranes moist.   Cardiovascular: S1 S2 auscultated, RRR.  Respiratory: Clear to auscultation bilaterally   Abdomen: Soft, nontender, nondistended, + bowel sounds  Extremities: warm dry without cyanosis clubbing or edema  Neuro: AAOx3, Right sided weakness.   Psych: Normal affect and demeanor with intact judgement and insight   Data Reviewed: I have personally reviewed following labs and imaging studies  CBC: Recent Labs  Lab 07/21/20 2101 07/24/20 0313  WBC 8.8 7.7  NEUTROABS 6.2  --   HGB 14.2 13.4  HCT 40.6 39.2  MCV 89.2 92.0  PLT 223 867   Basic Metabolic Panel: Recent Labs  Lab 07/21/20 2101 07/24/20 0313  NA 139 140  K 4.3 4.3  CL 109 109  CO2 23 26  GLUCOSE 148* 104*  BUN 26* 28*  CREATININE 1.64* 1.81*  CALCIUM 8.9 8.8*  MG  --  1.8  PHOS  --  3.7   GFR: Estimated Creatinine Clearance: 48.4 mL/min (A) (by C-G formula based on SCr of 1.81 mg/dL (H)). Liver Function Tests: Recent Labs  Lab 07/21/20 2101  AST 19  ALT 18  ALKPHOS 119  BILITOT 0.7  PROT 5.9*  ALBUMIN 3.2*   No results for input(s): LIPASE, AMYLASE in the last 168 hours. No results for input(s): AMMONIA in the last 168 hours. Coagulation Profile: Recent Labs  Lab 07/21/20 2101  INR 0.9   Cardiac Enzymes: No results for input(s): CKTOTAL, CKMB, CKMBINDEX, TROPONINI in the last 168 hours. BNP (last 3 results) No results for input(s): PROBNP in the last 8760 hours. HbA1C: Recent Labs    07/23/20 0208  HGBA1C 8.5*   CBG: Recent Labs  Lab 07/23/20 1201 07/23/20 1550 07/23/20 2111 07/24/20 0638 07/24/20 1240  GLUCAP 160* 174* 144* 91 106*   Lipid Profile: Recent Labs    07/23/20 0208  CHOL 156  HDL 29*  LDLCALC 88  TRIG 195*  CHOLHDL 5.4   Thyroid Function Tests: No  results for input(s): TSH, T4TOTAL, FREET4, T3FREE, THYROIDAB in the last 72 hours. Anemia Panel: No results for input(s): VITAMINB12, FOLATE, FERRITIN, TIBC, IRON, RETICCTPCT in the last 72 hours. Urine analysis:    Component Value Date/Time   COLORURINE YELLOW 07/22/2020 0105   APPEARANCEUR CLEAR 07/22/2020 0105   LABSPEC 1.018 07/22/2020 0105   PHURINE 7.0 07/22/2020 0105   GLUCOSEU NEGATIVE 07/22/2020 0105   HGBUR NEGATIVE 07/22/2020 0105   BILIRUBINUR NEGATIVE 07/22/2020 0105   BILIRUBINUR negative 03/31/2015 1723   BILIRUBINUR neg 08/16/2012 0912   KETONESUR NEGATIVE 07/22/2020 0105   PROTEINUR >300 (A) 07/22/2020 0105   UROBILINOGEN 0.2 03/31/2015 1723   UROBILINOGEN 0.2 04/29/2011 0004   NITRITE NEGATIVE 07/22/2020 0105   LEUKOCYTESUR NEGATIVE 07/22/2020 0105   Sepsis Labs: @LABRCNTIP (procalcitonin:4,lacticidven:4)  ) Recent Results (from the past 240 hour(s))  Resp Panel by RT-PCR (  Flu A&B, Covid) Nasopharyngeal Swab     Status: None   Collection Time: 07/21/20  9:01 PM   Specimen: Nasopharyngeal Swab; Nasopharyngeal(NP) swabs in vial transport medium  Result Value Ref Range Status   SARS Coronavirus 2 by RT PCR NEGATIVE NEGATIVE Final    Comment: (NOTE) SARS-CoV-2 target nucleic acids are NOT DETECTED.  The SARS-CoV-2 RNA is generally detectable in upper respiratory specimens during the acute phase of infection. The lowest concentration of SARS-CoV-2 viral copies this assay can detect is 138 copies/mL. A negative result does not preclude SARS-Cov-2 infection and should not be used as the sole basis for treatment or other patient management decisions. A negative result may occur with  improper specimen collection/handling, submission of specimen other than nasopharyngeal swab, presence of viral mutation(s) within the areas targeted by this assay, and inadequate number of viral copies(<138 copies/mL). A negative result must be combined with clinical observations,  patient history, and epidemiological information. The expected result is Negative.  Fact Sheet for Patients:  EntrepreneurPulse.com.au  Fact Sheet for Healthcare Providers:  IncredibleEmployment.be  This test is no t yet approved or cleared by the [place] FDA and  has been authorized for detection and/or diagnosis of SARS-CoV-2 by FDA under an Emergency Use Authorization (EUA). This EUA will remain  in effect (meaning this test can be used) for the duration of the COVID-19 declaration under Section 564(b)(1) of the Act, 21 U.S.C.section 360bbb-3(b)(1), unless the authorization is terminated  or revoked sooner.       Influenza A by PCR NEGATIVE NEGATIVE Final   Influenza B by PCR NEGATIVE NEGATIVE Final    Comment: (NOTE) The Xpert Xpress SARS-CoV-2/FLU/RSV plus assay is intended as an aid in the diagnosis of influenza from Nasopharyngeal swab specimens and should not be used as a sole basis for treatment. Nasal washings and aspirates are unacceptable for Xpert Xpress SARS-CoV-2/FLU/RSV testing.  Fact Sheet for Patients: EntrepreneurPulse.com.au  Fact Sheet for Healthcare Providers: IncredibleEmployment.be  This test is not yet approved or cleared by the [place] FDA and has been authorized for detection and/or diagnosis of SARS-CoV-2 by FDA under an Emergency Use Authorization (EUA). This EUA will remain in effect (meaning this test can be used) for the duration of the COVID-19 declaration under Section 564(b)(1) of the Act, 21 U.S.C. section 360bbb-3(b)(1), unless the authorization is terminated or revoked.  Performed at [facility], [address], [place],  [address]       Radiology Studies: MR BRAIN WO CONTRAST  Result Date: 07/23/2020 CLINICAL DATA:  Stroke follow-up EXAM: MRI HEAD WITHOUT CONTRAST TECHNIQUE: Multiplanar, multiecho pulse sequences of the  brain and surrounding structures were obtained without intravenous contrast. COMPARISON:  05/28/2020 FINDINGS: Brain: Restricted diffusion at the left pontomedullary junction and in the left cerebral white matter lateral to the atrium of the lateral ventricle. No acute hemorrhage, hydrocephalus, or collection. Chronic perforator infarct at the left thalamus. Small remote left parietal cortex infarct. Vascular: Preserved flow voids Skull and upper cervical spine: Normal marrow signal Sinuses/Orbits: Progressive, complete opacification of the right maxillary sinus when compared to prior. Postoperative right globe. IMPRESSION: 1. Small acute infarcts at the left pontine medullary junction and left cerebral white matter. 2. Small remote left parietal infarct. Chronic lacunar infarct at the left thalamus. 3. Active right maxillary sinusitis which has progressed from comparison 2 months ago. Electronically Signed   By: Monte Fantasia M.D.   On: 07/23/2020 08:43     Scheduled Meds: . allopurinol  300 mg Oral Daily  . aspirin EC  81 mg Oral Daily  . atorvastatin  40 mg Oral Daily  . enoxaparin (LOVENOX) injection  40 mg Subcutaneous Q24H  . insulin aspart  0-15 Units Subcutaneous TID WC  . insulin glargine  100 Units Subcutaneous q AM  . magnesium oxide  400 mg Oral BID  . multivitamin with minerals  1 tablet Oral Daily  . omega-3 acid ethyl esters  1 g Oral BID  . pantoprazole  40 mg Oral Daily  . [START ON 07/25/2020] ticagrelor  90 mg Oral BID   Continuous Infusions:   LOS: 2 days   Time Spent in minutes   45 minutes  Malana Eberwein D.O. on 07/24/2020 at 4:00 PM  Between 7am to 7pm - Please see pager noted on [email or web address]  After 7pm go to [email or web address]  And look for the night coverage person covering for me after hours  [facility] Office  [phone]

## 2020-07-24 NOTE — Progress Notes (Signed)
Inpatient Rehabilitation Admissions Coordinator  Inpatient rehab consult received, I will follow up with patient tomorrow to assist with possible CIR admit pending bed availability when patient medically ready to discharge.  Danne Baxter, RN, MSN Rehab Admissions Coordinator [phone] 07/24/2020 6:54 PM

## 2020-07-24 NOTE — Progress Notes (Signed)
Pharmacy:  Requested to transition from Plavix to [place]. Taking Plavix PTA. Last Plavix dose at 1049 today. Not currently on Aspirin.  Ok to add today per I. Olivencia-Simmons, PA-C.  Plan:  Stop Plavix.  Begin Brilinta 90 mg PO BID on 5/26.  Begin Aspirin 81 mg PO daily today.  Arty Baumgartner, [place] 07/24/2020 12:55 PM

## 2020-07-24 NOTE — Progress Notes (Signed)
Stroke Neurology Consultation Note  Reason for Consult: stroke  Consult Date:  07/24/20  The history was obtained from the patient and electronic medical record.  During history and examination, all items were obtained unless otherwise noted.  History of Present Illness:  Mitchell Lambert is an 69 y.o. 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  The patient woke up with double vision on Monday.  Shortly afterward he felt dizzy with associated nausea.  His wife noticed left-sided facial droop and eyelid droop and called 911.    Approximately 2 months ago he had right sided vision changes and aphasia.  He presented to [facility].  A CT was obtained which showed Ill-defined hypoattenuation involving the left parietal white matter with slight loss of gray-white differentiation, concerning for age indeterminate infarct. MRA showed no LVO but stenosis of the proximal right 2 segment.  MRI reveald an old left parietal infarct.  He was diagnosed with TIA and placed on Plavix.      He subsequently had an outpatient TEE which was negative and a loop recorder inserted Date last known well: Date: 07/21/2020 Time last known well: Unable to determine tPA Given: No: outside of the tPA window   Past Medical History:  Diagnosis Date  . Angina    NO ANGINA SINCE CABG  . Arthritis    right ankle  . Chronic daily headache    "@ least every other day""improved since heart surgery"  . Coronary artery disease    PT STATES HEART DOING WELL SINCE CABG SURGERY - DR. TILLEY IS HIS CARDIOLOGIST  . Diabetes mellitus    Lantus x 10 yrs  . Gout   . History of kidney stones 09-08-12   multiple times with some lithotripsies  . Hyperlipemia   . Hypertension      Past  Surgical History:  Procedure Laterality Date  . BACK SURGERY     microsurgery: spinal stenosis -lumbar  . bone spur  1990's   right great toe; "probably related to gout"  . BUBBLE STUDY  06/17/2020   Procedure: BUBBLE STUDY;  Surgeon: Lelon Perla, MD;  Location: [place];  Service: Cardiovascular;;  . CARDIAC CATHETERIZATION  04/27/11  . CATARACT EXTRACTION W/ INTRAOCULAR LENS IMPLANT  1990's   right; "lens was replaced twice over 3 months; lens is actually over iris"  . CORONARY ARTERY BYPASS GRAFT  04/30/2011   Procedure: CORONARY ARTERY BYPASS GRAFTING (CABG);  Surgeon: Melrose Nakayama, MD;  Location: [place];  Service: Open Heart Surgery;  Laterality: N/A;,x4 vessels  . CYSTOSCOPY W/ URETERAL STENT PLACEMENT Right 08/19/2012   Procedure: CYSTOSCOPY WITH RETROGRADE PYELOGRAM/URETERAL STENT PLACEMENT;  Surgeon: Molli Hazard, MD;  Location: [facility];  Service: Urology;  Laterality: Right;  . CYSTOSCOPY WITH RETROGRADE PYELOGRAM, URETEROSCOPY AND STENT PLACEMENT Left 07/17/2013   Procedure: CYSTOSCOPY, LEFT URETEROSCOPY, BASKET EXTRACTION OF STONE, INSERTION OF LEFT URETERAL STENT, ;  Surgeon: Jorja Loa, MD;  Location: [facility];  Service: Urology;  Laterality: Left;  . CYSTOSCOPY/RETROGRADE/URETEROSCOPY Right 09/12/2012   Procedure: CYSTOSCOPY/RETROGRADE/URETEROSCOPY;  Surgeon: Franchot Gallo, MD;  Location: [facility];  Service: Urology;  Laterality: Right;  . HOLMIUM LASER APPLICATION Right 1/70/0174   Procedure: HOLMIUM LASER APPLICATION;  Surgeon: Franchot Gallo, MD;  Location: [facility];  Service: Urology;  Laterality: Right;  . HOLMIUM LASER APPLICATION Left 5/70/1779   Procedure: HOLMIUM LASER APPLICATION;  Surgeon: Jorja Loa, MD;  Location: [facility];  Service: Urology;  Laterality: Left;  . KIDNEY STONE SURGERY     "probably 3 times"  . LEFT HEART CATHETERIZATION WITH CORONARY ANGIOGRAM N/A 04/28/2011   Procedure: LEFT HEART CATHETERIZATION WITH CORONARY  ANGIOGRAM;  Surgeon: Jacolyn Reedy, MD;  Location: [facility] CATH LAB;  Service: Cardiovascular;  Laterality: N/A;  . LITHOTRIPSY     "many; probably 5 times"  . RADIAL ARTERY HARVEST  04/30/2011   Procedure: RADIAL ARTERY HARVEST;  Surgeon: Melrose Nakayama, MD;  Location: [place];  Service: Open Heart Surgery;  Laterality: Left;  . RETINAL DETACHMENT SURGERY  1990's   right eye; "made me have an early cataract"  . TEE WITHOUT CARDIOVERSION N/A 06/17/2020   Procedure: TRANSESOPHAGEAL ECHOCARDIOGRAM (TEE);  Surgeon: Lelon Perla, MD;  Location: [facility] ENDOSCOPY;  Service: Cardiovascular;  Laterality: N/A;  . VASECTOMY      Family History  Problem Relation Age of Onset  . Cataracts Father   . Cataracts Mother   . Diabetes Paternal Aunt   . Diabetes Paternal Grandmother   . Amblyopia Neg Hx   . Blindness Neg Hx   . Glaucoma Neg Hx   . Macular degeneration Neg Hx   . Retinal detachment Neg Hx   . Strabismus Neg Hx   . Retinitis pigmentosa Neg Hx      Social History:  reports that he has never smoked. He has never used smokeless tobacco. He reports current alcohol use. He reports current drug use. Drug: Marijuana.  Review of Systems:  Systems assessed include - Constitutional, Eyes, HENT, Respiratory, Cardiovascular, Gastrointestinal, Genitourinary, Integument/breast, Hematologic/lymphatic, Musculoskeletal, Neurological, Behavioral/Psych, Endocrine, Allergic/Immunologic - with pertinent responses as per HPI.  Allergies:  Allergies  Allergen Reactions  . Gabapentin Other (See Comments)    "made me so dizzy I missed work for 2 days"  . Metformin And Related Shortness Of Breath  . Sulfa Antibiotics Shortness Of Breath and Rash  . Azithromycin Other (See Comments)    not good for heart condition  . Ciprofloxacin Other (See Comments)    tendon rupture  . Lisinopril Cough  . Losartan Potassium Other (See Comments)    hypotension  . Olmesartan Cough  . Simvastatin Cough  .  Amoxicillin Rash and Other (See Comments)    rash Did it involve swelling of the face/tongue/throat, SOB, or low BP? N Did it involve sudden or severe rash/hives, skin peeling, or any reaction on the inside of your mouth or nose?  Y Did you need to seek medical attention at a hospital or doctor's office? N When did it last happen?"years and years ago" If all above answers are "NO", may proceed with cephalosporin use.   Marland Kitchen Doxycycline Rash  . Penicillins Rash     Medications:  Prior to Admission:  Medications Prior to Admission  Medication Sig Dispense Refill Last Dose  . acetaminophen (TYLENOL) 325 MG tablet Take 325-650 mg by mouth every 6 (six) hours as needed for mild pain, fever or headache.   Past Week at Unknown time  . allopurinol (ZYLOPRIM) 300 MG tablet Take 300 mg by mouth daily.   07/21/2020 at Unknown time  . amLODipine (NORVASC) 5 MG tablet TAKE 1 TABLET(5 MG) BY MOUTH DAILY (Patient taking differently: Take 5 mg by mouth daily.) 90 tablet 3 07/21/2020 at Unknown time  .  atorvastatin (LIPITOR) 40 MG tablet Take 40 mg by mouth daily.   07/21/2020 at Unknown time  . Biotin 5000 MCG TABS Take 5,000 mcg by mouth daily.    07/21/2020 at Unknown time  . bisoprolol (ZEBETA) 5 MG tablet Take 5 mg by mouth daily.   07/21/2020 at 1100  . clopidogrel (PLAVIX) 75 MG tablet Take 1 tablet (75 mg total) by mouth daily. 30 tablet 0 07/21/2020 at 1100  . fish oil-omega-3 fatty acids 1000 MG capsule Take 1 g by mouth 2 (two) times daily.   07/21/2020 at Unknown time  . glucosamine-chondroitin 500-400 MG tablet Take 2 tablets by mouth daily.   07/21/2020 at Unknown time  . ibuprofen (ADVIL) 200 MG tablet Take 200 mg by mouth every 6 (six) hours as needed for fever, headache or mild pain.   Past Week at Unknown time  . insulin aspart (NOVOLOG FLEXPEN) 100 UNIT/ML FlexPen Inject 10-15 Units into the skin as needed for high blood sugar.   Past Week at Unknown time  . Insulin Glargine (BASAGLAR  KWIKPEN) 100 UNIT/ML SOPN Inject 100 Units into the skin in the morning. In AM   07/21/2020 at Unknown time  . irbesartan (AVAPRO) 300 MG tablet Take 1 tablet (300 mg total) by mouth daily. 90 tablet 0 07/21/2020 at Unknown time  . magnesium oxide (MAG-OX) 400 MG tablet Take 400 mg by mouth 2 (two) times daily.    07/21/2020 at Unknown time  . Multiple Vitamins-Minerals (MULTIVITAMIN WITH MINERALS) tablet Take 1 tablet by mouth daily.   07/21/2020 at Unknown time  . nitroGLYCERIN (NITROSTAT) 0.4 MG SL tablet Place 0.4 mg under the tongue as needed for chest pain.   never  . oxymetazoline (AFRIN) 0.05 % nasal spray Place 1 spray into both nostrils at bedtime as needed for congestion.   Past Week at Unknown time  . traMADol (ULTRAM) 50 MG tablet Take 50 mg by mouth daily.   Past Week at Unknown time  . ACCU-CHEK GUIDE test strip USE TO TEST BLOOD SUGAR 2 TO 3 TIMES A DAY  5   . Alirocumab (PRALUENT) 150 MG/ML SOAJ Inject 150 mg into the skin every 14 (fourteen) days. (Patient not taking: Reported on 07/22/2020) 2 mL 11 Not Taking at Unknown time  . B-D ULTRAFINE III SHORT PEN 31G X 8 MM MISC USE UTD WITH FLEXPEN.  3     Test Results: CBC:  Recent Labs  Lab 07/21/20 2101 07/24/20 0313  WBC 8.8 7.7  NEUTROABS 6.2  --   HGB 14.2 13.4  HCT 40.6 39.2  MCV 89.2 92.0  PLT 223 993   Basic Metabolic Panel:  Recent Labs  Lab 07/21/20 2101 07/24/20 0313  NA 139 140  K 4.3 4.3  CL 109 109  CO2 23 26  GLUCOSE 148* 104*  BUN 26* 28*  CREATININE 1.64* 1.81*  CALCIUM 8.9 8.8*  MG  --  1.8  PHOS  --  3.7   Liver Function Tests: Recent Labs  Lab 07/21/20 2101  AST 19  ALT 18  ALKPHOS 119  BILITOT 0.7  PROT 5.9*  ALBUMIN 3.2*   No results for input(s): LIPASE, AMYLASE in the last 168 hours. No results for input(s): AMMONIA in the last 168 hours. Coagulation Studies:  Recent Labs    07/21/20 2101  LABPROT 11.8  INR 0.9   Cardiac Enzymes: No results for input(s): CKTOTAL, CKMB,  CKMBINDEX, TROPONINI in the last 168 hours. BNP: Invalid input(s): POCBNP CBG:  Recent Labs  Lab 07/23/20 0623 07/23/20 1201 07/23/20 1550 07/23/20 2111 07/24/20 0638  GLUCAP 130* 160* 174* 144* 91   Urinalysis:  Recent Labs  Lab 07/22/20 0105  COLORURINE YELLOW  LABSPEC 1.018  PHURINE 7.0  GLUCOSEU NEGATIVE  HGBUR NEGATIVE  BILIRUBINUR NEGATIVE  KETONESUR NEGATIVE  PROTEINUR >300*  NITRITE NEGATIVE  LEUKOCYTESUR NEGATIVE   Microbiology:  Results for orders placed or performed during the hospital encounter of 07/21/20  Resp Panel by RT-PCR (Flu A&B, Covid) Nasopharyngeal Swab     Status: None   Collection Time: 07/21/20  9:01 PM   Specimen: Nasopharyngeal Swab; Nasopharyngeal(NP) swabs in vial transport medium  Result Value Ref Range Status   SARS Coronavirus 2 by RT PCR NEGATIVE NEGATIVE Final    Comment: (NOTE) SARS-CoV-2 target nucleic acids are NOT DETECTED.  The SARS-CoV-2 RNA is generally detectable in upper respiratory specimens during the acute phase of infection. The lowest concentration of SARS-CoV-2 viral copies this assay can detect is 138 copies/mL. A negative result does not preclude SARS-Cov-2 infection and should not be used as the sole basis for treatment or other patient management decisions. A negative result may occur with  improper specimen collection/handling, submission of specimen other than nasopharyngeal swab, presence of viral mutation(s) within the areas targeted by this assay, and inadequate number of viral copies(<138 copies/mL). A negative result must be combined with clinical observations, patient history, and epidemiological information. The expected result is Negative.  Fact Sheet for Patients:  EntrepreneurPulse.com.au  Fact Sheet for Healthcare Providers:  IncredibleEmployment.be  This test is no t yet approved or cleared by the [place] FDA and  has been authorized for detection  and/or diagnosis of SARS-CoV-2 by FDA under an Emergency Use Authorization (EUA). This EUA will remain  in effect (meaning this test can be used) for the duration of the COVID-19 declaration under Section 564(b)(1) of the Act, 21 U.S.C.section 360bbb-3(b)(1), unless the authorization is terminated  or revoked sooner.       Influenza A by PCR NEGATIVE NEGATIVE Final   Influenza B by PCR NEGATIVE NEGATIVE Final    Comment: (NOTE) The Xpert Xpress SARS-CoV-2/FLU/RSV plus assay is intended as an aid in the diagnosis of influenza from Nasopharyngeal swab specimens and should not be used as a sole basis for treatment. Nasal washings and aspirates are unacceptable for Xpert Xpress SARS-CoV-2/FLU/RSV testing.  Fact Sheet for Patients: EntrepreneurPulse.com.au  Fact Sheet for Healthcare Providers: IncredibleEmployment.be  This test is not yet approved or cleared by the [place] FDA and has been authorized for detection and/or diagnosis of SARS-CoV-2 by FDA under an Emergency Use Authorization (EUA). This EUA will remain in effect (meaning this test can be used) for the duration of the COVID-19 declaration under Section 564(b)(1) of the Act, 21 U.S.C. section 360bbb-3(b)(1), unless the authorization is terminated or revoked.  Performed at [facility], [place], [place], [place] [address]    Lipid Panel:     Component Value Date/Time   CHOL 156 07/23/2020 0208   TRIG 195 (H) 07/23/2020 0208   HDL 29 (L) 07/23/2020 0208   CHOLHDL 5.4 07/23/2020 0208   VLDL 39 07/23/2020 0208   LDLCALC 88 07/23/2020 0208   HgbA1c:  Lab Results  Component Value Date   HGBA1C 8.5 (H) 07/23/2020   Urine Drug Screen:     Component Value Date/Time   LABOPIA NONE DETECTED 07/22/2020 0101   COCAINSCRNUR NONE DETECTED 07/22/2020 0101   LABBENZ NONE DETECTED 07/22/2020 0101  AMPHETMU NONE DETECTED 07/22/2020 0101   THCU NONE  DETECTED 07/22/2020 0101   LABBARB NONE DETECTED 07/22/2020 0101    Alcohol Level:  Recent Labs  Lab 07/21/20 2101  ETH <10    CT HEAD WO CONTRAST  Result Date: 07/21/2020 CLINICAL DATA:  Diplopia EXAM: CT HEAD WITHOUT CONTRAST TECHNIQUE: Contiguous axial images were obtained from the base of the skull through the vertex without intravenous contrast. COMPARISON:  05/27/2020 FINDINGS: Brain: Cavum septum pellucidum. Parenchymal volume loss is commensurate with the patient's age. Mild periventricular white matter changes are present likely reflecting the sequela of small vessel ischemia. Remote left parietal subcortical white matter infarct and punctate left thalamic infarct again noted. No abnormal intra or extra-axial mass lesion or fluid collection. No abnormal mass effect or midline shift. No evidence of acute intracranial hemorrhage or infarct. Ventricular size is normal. Cerebellum unremarkable. Vascular: No asymmetric hyperdense vasculature at the skull base. Skull: Intact Sinuses/Orbits: Moderate mucosal thickening involving the right maxillary sinus without air-fluid level. Remaining paranasal sinuses are clear. Orbits are unremarkable. Other: Mastoid air cells and middle ear cavities are clear. IMPRESSION: No acute intracranial hemorrhage or infarct. Stable senescent changes and remote infarcts. Unchanged moderate right maxillary sinus disease. Electronically Signed   By: Fidela Salisbury MD   On: 07/21/2020 22:15   MR BRAIN WO CONTRAST  Result Date: 07/23/2020 CLINICAL DATA:  Stroke follow-up EXAM: MRI HEAD WITHOUT CONTRAST TECHNIQUE: Multiplanar, multiecho pulse sequences of the brain and surrounding structures were obtained without intravenous contrast. COMPARISON:  05/28/2020 FINDINGS: Brain: Restricted diffusion at the left pontomedullary junction and in the left cerebral white matter lateral to the atrium of the lateral ventricle. No acute hemorrhage, hydrocephalus, or collection. Chronic  perforator infarct at the left thalamus. Small remote left parietal cortex infarct. Vascular: Preserved flow voids Skull and upper cervical spine: Normal marrow signal Sinuses/Orbits: Progressive, complete opacification of the right maxillary sinus when compared to prior. Postoperative right globe. IMPRESSION: 1. Small acute infarcts at the left pontine medullary junction and left cerebral white matter. 2. Small remote left parietal infarct. Chronic lacunar infarct at the left thalamus. 3. Active right maxillary sinusitis which has progressed from comparison 2 months ago. Electronically Signed   By: Monte Fantasia M.D.   On: 07/23/2020 08:43     TDD:UKGUR brady   Physical Examination: Temp:  [97.9 F (36.6 C)-98.8 F (37.1 C)] 97.9 F (36.6 C) (05/25 0336) Pulse Rate:  [48-56] 50 (05/25 0821) Resp:  [14-20] 16 (05/25 0821) BP: (140-170)/(63-85) 163/75 (05/25 0821) SpO2:  [96 %-98 %] 96 % (05/25 0821)  General -pleasant mildly obese elderly Caucasian male, in no apparent distress. Ophthalmologic - fundi not visualized due to noncooperation. Cardiovascular - Regular rate and rhythm.  Mental Status -  Level of arousal and orientation to time, place, and person were intact. Language including expression, naming, repetition, comprehension was assessed and found intact. Attention span and concentration were normal. Recent and remote memory were intact. Fund of Knowledge was assessed and was intact.  Cranial Nerves II - XII - II - Visual field intact OU.  No peripheral visual field cut. III, IV, VI - Extraocular movements show slight restriction of left lateral movement in the left eye only with subjective diplopia V - Facial sensation intact bilaterally. VII - left facial droop VIII - Hearing & vestibular intact bilaterally. X - Palate elevates symmetrically. XI - Chin turning & shoulder shrug intact bilaterally. XII - Tongue protrusion intact.  Motor Strength - Left upper/lower  extremities 5/5 and pronator drift was absent. Right upper extremity 4+/5, hand grip 4+, right lower extremity 5/5 Bulk was normal and fasciculations were absent.  Diminished fine finger movements on the right.  Orbits left over right upper extremity.  Mild weakness of right grip and right triceps.  Mild weakness of right ankle dorsiflexors. Motor Tone - Muscle tone was assessed at the neck and appendages and was normal.  Sensory - Light touch, temperature/pinprick were assessed and were symmetrical.    Coordination - The patient had normal movements in the hands and feet with no ataxia or dysmetria.  Tremor was absent.  Gait and Station - deferred.   NIHSS 2 right facial droop and left horizontal movement impaired   premorbid modified Rankin scale 1  Assessment:  Mitchell Lambert is a 70 y.o. male with history of hypertension hyperlipidemia, diabetes, who presents with dizziness and diplopia due to small left pontomedullary junction and left cerebral white matter infarcts. He did not receive IV t-PA due to low NIH stroke scale.   Stroke:  left pontine medullary junction and left cerebral white mater infarcts likely due to embolic source.  CT head No acute intracranial hemorrhage or infarct  MRI  Small acute infarcts at the left pontine medullary junction and left cerebral white matter.  Small remote left parietal infarct. Chronic lacunar infarct the left thalamus.  Carotid Doppler  pending  2D Echo pending  LDL 88  HgbA1c 8.5   Lovenox for VTE prophylaxis  Loop recorder placed 06/18/2020, interrogate today  Diet: heart healthy    clopidogrel 75 mg daily prior to admission, now on aspirin 81 mg daily and Brilinta (ticagrelor) 90 mg bid. DAPT for 4 weeks then aspirin alone.  Therapy recommendations:  CIR  Disposition:  Pending  Hypertension  Stable . Permissive hypertension (OK if < 220/120) but gradually normalize in 5-7 days . Long-term BP goal  normotensive  Hyperlipidemia  Home meds:  Atorvastatin 40, resumed in hospital  LDL 88, goal < 70  Continue statin at discharge  Diabetes type II, Controlled  HgbA1c 8.5, goal < 7.0  CBGs  SSI  Other Stroke Risk Factors  Advanced age  Prior marijuana use   ETOH use, advised to drink no more than 1 drink a day  Obesity; recommend weight loss, diet and exercise as appropriate   Hx stroke: left thalamus lacunar infarct  Coronary artery disease s/p CABG  Migraines   Other Active Problems  CKD: creatinine 1.81, baseline 1.7  Hospital day # 2   Thank you for this consultation and allowing [place] to participate in the care of this patient.  Lissy Olivencia-Simmons, ACNP-BC Stroke NP I have personally obtained history,examined this patient, reviewed notes, independently viewed imaging studies, participated in medical decision making and plan of care.ROS completed by me personally and pertinent positives fully documented  I have made any additions or clarifications directly to the above note. Agree with note above.  He presented with sudden onset of dizziness and diplopia which is binocular and gait unsteadiness due to left pontomedullary infarct but MRI also shows left parieto-occipital infarct and a few months ago he had an episode of expressive aphasia and right hemiparesis as well.  Strong suspicion for underlying cardioembolic source though he may have small vessel disease as well.  Recommend interrogate loop recorder to look for paroxysmal A. fib.  Dual antiplatelet therapy of aspirin and Brilinta for 4 weeks followed by aspirin alone and aggressive risk factor modification.  Physical ,  Occupational Therapy and rehab consults.  Patient also appears to be at risk for sleep apnea and may consider possible participation in the sleep smart study if interested.  He was given written information to review and decide.  Long discussion patient and wife and answered questions.  Discussed  with Dr. Ree Kida.  Greater than 50% time during this 35-minute visit was spent in counseling and coordination of care about his stroke and discussion about stroke prevention and treatment and answering questions.  Antony Contras, MD Medical Director [facility] Pager: [phone] 07/24/2020 4:29 PM  To contact Stroke Continuity provider, please refer to [email or web address] After hours, contact General Neurology

## 2020-07-24 NOTE — Progress Notes (Signed)
Physical Therapy Treatment Patient Details Name: Mitchell Lambert MRN: [id number] DOB: Oct 22, 1950 Today's Date: 07/24/2020    History of Present Illness 70 y/o male presented to [facility] ED for concern of dizziness and diplopia. MRI showed acute L pontine CVA junction and  left cerebral white matter; remote left parietal infarct. 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 PMH: CAD s/p CABG, HTN, asthma, type 2 diabetes, CKD stage III, HLD, remote R retinal detachment, HTN, CVA.    PT Comments    Pt sitting EOB unsupported upon PT arrival to room, eager to mobilize. Pt demonstrating ataxic gait with narrow BOS, impaired sensorimotor control of RLE especially in closed chain, and impaired balance with multiple instances requiring mod PT assist to correct. PT initiated quad cane gait training today to provide increased LUE support, PT preventing frequent knee recurvatum and buckling RLE. Pt requiring max cuing for safe mobility at this time. CIR remains appropriate, pt is an excellent candidate.    Follow Up Recommendations  CIR;Supervision/Assistance - 24 hour     Equipment Recommendations  Other (comment) (TBD)    Recommendations for Other Services Rehab consult     Precautions / Restrictions Precautions Precautions: Fall Precaution Comments: overlapping vision and dizziness Restrictions Weight Bearing Restrictions: No    Mobility  Bed Mobility               General bed mobility comments: sitting EOB upon PT arrival    Transfers Overall transfer level: Needs assistance Equipment used: 1 person hand Lambert assist Transfers: Sit to/from Stand Sit to Stand: Min assist         General transfer comment: min assist for rise, steadying upon standing via HHA. STS x2.  Ambulation/Gait Ambulation/Gait assistance: Min assist;Mod assist Gait Distance (Feet): 80 Feet (+50) Assistive device: Quad cane;1 person hand Lambert assist Gait Pattern/deviations: Step-to  pattern;Decreased step length - right;Trunk flexed;Narrow base of support Gait velocity: decr   General Gait Details: Min assist to steady, posterior knee facilitation to prevent frequent genu recurvatum, correct LOB especially when challenged (side-stepping, directional changes). First gait attempt with chair rail in hallway and HHA, converted to quad cane only. VC for sequencing with cane, step-to gait.   Stairs             Wheelchair Mobility    Modified Rankin (Stroke Patients Only) Modified Rankin (Stroke Patients Only) Pre-Morbid Rankin Score: No symptoms Modified Rankin: Moderately severe disability     Balance Overall balance assessment: Needs assistance Sitting-balance support: No upper extremity supported;Feet supported Sitting balance-Leahy Scale: Good     Standing balance support: No upper extremity supported;During functional activity Standing balance-Leahy Scale: Poor Standing balance comment: requires external support                            Cognition Arousal/Alertness: Awake/alert Behavior During Therapy: WFL for tasks assessed/performed Overall Cognitive Status: Impaired/Different from baseline Area of Impairment: Safety/judgement;Awareness;Attention                   Current Attention Level: Selective     Safety/Judgement: Decreased awareness of deficits;Decreased awareness of safety Awareness: Emergent   General Comments: Follows cues well, however requires max repeated cuing for form and safety      Exercises      General Comments        Pertinent Vitals/Pain Pain Assessment: No/denies pain    Home Living  Prior Function            PT Goals (current goals can now be found in the care plan section) Acute Rehab PT Goals Patient Stated Goal: to go home, get back to drumming PT Goal Formulation: With patient Time For Goal Achievement: 08/05/20 Potential to Achieve Goals:  Good Progress towards PT goals: Progressing toward goals    Frequency    Min 4X/week      PT Plan Current plan remains appropriate    Co-evaluation              AM-PAC PT "6 Clicks" Mobility   Outcome Measure  Help needed turning from your back to your side while in a flat bed without using bedrails?: A Little Help needed moving from lying on your back to sitting on the side of a flat bed without using bedrails?: A Little Help needed moving to and from a bed to a chair (including a wheelchair)?: A Little Help needed standing up from a chair using your arms (e.g., wheelchair or bedside chair)?: A Little Help needed to walk in hospital room?: A Lot Help needed climbing 3-5 steps with a railing? : A Lot 6 Click Score: 16    End of Session Equipment Utilized During Treatment: Gait belt Activity Tolerance: Patient tolerated treatment well Patient left: in bed;with call bell/phone within reach;with family/visitor present;with bed alarm set Nurse Communication: Mobility status PT Visit Diagnosis: Unsteadiness on feet (R26.81);Muscle weakness (generalized) (M62.81);Ataxic gait (R26.0)     Time: 0350-0938 PT Time Calculation (min) (ACUTE ONLY): 33 min  Charges:  $Gait Training: 23-37 mins                     Stacie Glaze, PT DPT Acute Rehabilitation Services Pager [phone]  Office [phone]    [place] 07/24/2020, 5:27 PM

## 2020-07-24 NOTE — Progress Notes (Signed)
PT Cancellation Note  Patient Details Name: Mitchell Lambert MRN: [id number] DOB: 01/07/1951   Cancelled Treatment:    Reason Eval/Treat Not Completed: Patient at procedure or test/unavailable - echo at bedside    [place] 07/24/2020, 2:26 PM

## 2020-07-24 NOTE — Progress Notes (Signed)
TCD & carotid duplex have been completed.   Preliminary results in CV Proc.   Mitchell Lambert 07/24/2020 4:04 PM

## 2020-07-25 ENCOUNTER — Encounter (HOSPITAL_COMMUNITY): Payer: Self-pay | Admitting: Physical Medicine & Rehabilitation

## 2020-07-25 ENCOUNTER — Inpatient Hospital Stay (HOSPITAL_COMMUNITY)
Admission: RE | Admit: 2020-07-25 | Discharge: 2020-08-07 | DRG: 057 | Disposition: A | Discharge status: Home Health | Payer: Medicare Other | Source: Intra-hospital | Attending: Physical Medicine & Rehabilitation | Admitting: Physical Medicine & Rehabilitation

## 2020-07-25 ENCOUNTER — Encounter (HOSPITAL_COMMUNITY): Payer: Self-pay | Admitting: Internal Medicine

## 2020-07-25 DIAGNOSIS — M25571 Pain in right ankle and joints of right foot: Secondary | ICD-10-CM | POA: Diagnosis present

## 2020-07-25 DIAGNOSIS — N182 Chronic kidney disease, stage 2 (mild): Secondary | ICD-10-CM | POA: Diagnosis present

## 2020-07-25 DIAGNOSIS — E1165 Type 2 diabetes mellitus with hyperglycemia: Secondary | ICD-10-CM | POA: Diagnosis present

## 2020-07-25 DIAGNOSIS — Z87442 Personal history of urinary calculi: Secondary | ICD-10-CM

## 2020-07-25 DIAGNOSIS — Z951 Presence of aortocoronary bypass graft: Secondary | ICD-10-CM

## 2020-07-25 DIAGNOSIS — Z794 Long term (current) use of insulin: Secondary | ICD-10-CM

## 2020-07-25 DIAGNOSIS — I69393 Ataxia following cerebral infarction: Secondary | ICD-10-CM | POA: Diagnosis not present

## 2020-07-25 DIAGNOSIS — G4700 Insomnia, unspecified: Secondary | ICD-10-CM | POA: Diagnosis present

## 2020-07-25 DIAGNOSIS — I639 Cerebral infarction, unspecified: Secondary | ICD-10-CM | POA: Diagnosis not present

## 2020-07-25 DIAGNOSIS — Z6831 Body mass index (BMI) 31.0-31.9, adult: Secondary | ICD-10-CM

## 2020-07-25 DIAGNOSIS — E1122 Type 2 diabetes mellitus with diabetic chronic kidney disease: Secondary | ICD-10-CM | POA: Diagnosis present

## 2020-07-25 DIAGNOSIS — H02402 Unspecified ptosis of left eyelid: Secondary | ICD-10-CM | POA: Diagnosis present

## 2020-07-25 DIAGNOSIS — I129 Hypertensive chronic kidney disease with stage 1 through stage 4 chronic kidney disease, or unspecified chronic kidney disease: Secondary | ICD-10-CM | POA: Diagnosis present

## 2020-07-25 DIAGNOSIS — N1832 Chronic kidney disease, stage 3b: Secondary | ICD-10-CM | POA: Diagnosis present

## 2020-07-25 DIAGNOSIS — H532 Diplopia: Secondary | ICD-10-CM | POA: Diagnosis present

## 2020-07-25 DIAGNOSIS — E11649 Type 2 diabetes mellitus with hypoglycemia without coma: Secondary | ICD-10-CM | POA: Diagnosis not present

## 2020-07-25 DIAGNOSIS — I69319 Unspecified symptoms and signs involving cognitive functions following cerebral infarction: Secondary | ICD-10-CM

## 2020-07-25 DIAGNOSIS — I69390 Apraxia following cerebral infarction: Secondary | ICD-10-CM | POA: Diagnosis not present

## 2020-07-25 DIAGNOSIS — Z833 Family history of diabetes mellitus: Secondary | ICD-10-CM

## 2020-07-25 DIAGNOSIS — M10071 Idiopathic gout, right ankle and foot: Secondary | ICD-10-CM | POA: Diagnosis not present

## 2020-07-25 DIAGNOSIS — M21371 Foot drop, right foot: Secondary | ICD-10-CM | POA: Diagnosis present

## 2020-07-25 DIAGNOSIS — R52 Pain, unspecified: Secondary | ICD-10-CM

## 2020-07-25 DIAGNOSIS — I2510 Atherosclerotic heart disease of native coronary artery without angina pectoris: Secondary | ICD-10-CM | POA: Diagnosis present

## 2020-07-25 DIAGNOSIS — M19071 Primary osteoarthritis, right ankle and foot: Secondary | ICD-10-CM | POA: Diagnosis not present

## 2020-07-25 DIAGNOSIS — E669 Obesity, unspecified: Secondary | ICD-10-CM | POA: Diagnosis present

## 2020-07-25 DIAGNOSIS — Z79899 Other long term (current) drug therapy: Secondary | ICD-10-CM | POA: Diagnosis not present

## 2020-07-25 DIAGNOSIS — Z7902 Long term (current) use of antithrombotics/antiplatelets: Secondary | ICD-10-CM

## 2020-07-25 DIAGNOSIS — E1169 Type 2 diabetes mellitus with other specified complication: Secondary | ICD-10-CM | POA: Diagnosis present

## 2020-07-25 DIAGNOSIS — I69392 Facial weakness following cerebral infarction: Secondary | ICD-10-CM | POA: Diagnosis not present

## 2020-07-25 DIAGNOSIS — I69320 Aphasia following cerebral infarction: Secondary | ICD-10-CM

## 2020-07-25 DIAGNOSIS — M109 Gout, unspecified: Secondary | ICD-10-CM | POA: Diagnosis present

## 2020-07-25 DIAGNOSIS — Z881 Allergy status to other antibiotic agents status: Secondary | ICD-10-CM

## 2020-07-25 DIAGNOSIS — R600 Localized edema: Secondary | ICD-10-CM | POA: Diagnosis not present

## 2020-07-25 DIAGNOSIS — M19072 Primary osteoarthritis, left ankle and foot: Secondary | ICD-10-CM | POA: Diagnosis present

## 2020-07-25 DIAGNOSIS — Z888 Allergy status to other drugs, medicaments and biological substances status: Secondary | ICD-10-CM

## 2020-07-25 DIAGNOSIS — I69351 Hemiplegia and hemiparesis following cerebral infarction affecting right dominant side: Principal | ICD-10-CM

## 2020-07-25 DIAGNOSIS — G8191 Hemiplegia, unspecified affecting right dominant side: Secondary | ICD-10-CM

## 2020-07-25 DIAGNOSIS — H43392 Other vitreous opacities, left eye: Secondary | ICD-10-CM | POA: Diagnosis present

## 2020-07-25 DIAGNOSIS — Z880 Allergy status to penicillin: Secondary | ICD-10-CM

## 2020-07-25 DIAGNOSIS — I10 Essential (primary) hypertension: Secondary | ICD-10-CM | POA: Diagnosis present

## 2020-07-25 DIAGNOSIS — Z882 Allergy status to sulfonamides status: Secondary | ICD-10-CM

## 2020-07-25 DIAGNOSIS — R001 Bradycardia, unspecified: Secondary | ICD-10-CM | POA: Diagnosis not present

## 2020-07-25 LAB — BASIC METABOLIC PANEL
Anion gap: 6 (ref 5–15)
BUN: 33 mg/dL — ABNORMAL HIGH (ref 8–23)
CO2: 26 mmol/L (ref 22–32)
Calcium: 8.9 mg/dL (ref 8.9–10.3)
Chloride: 109 mmol/L (ref 98–111)
Creatinine, Ser: 2.02 mg/dL — ABNORMAL HIGH (ref 0.61–1.24)
GFR, Estimated: 35 mL/min — ABNORMAL LOW (ref >60)
Glucose, Bld: 89 mg/dL (ref 70–99)
Potassium: 4 mmol/L (ref 3.5–5.1)
Sodium: 141 mmol/L (ref 135–145)

## 2020-07-25 LAB — GLUCOSE, CAPILLARY
Glucose-Capillary: 69 mg/dL — ABNORMAL LOW (ref 70–99)
Glucose-Capillary: 77 mg/dL (ref 70–99)
Glucose-Capillary: 134 mg/dL — ABNORMAL HIGH (ref 70–99)
Glucose-Capillary: 199 mg/dL — ABNORMAL HIGH (ref 70–99)
Glucose-Capillary: 148 mg/dL — ABNORMAL HIGH (ref 70–99)

## 2020-07-25 MED ORDER — EXERCISE FOR HEART AND HEALTH BOOK
Freq: Once | Status: DC
  Filled 2020-07-25: qty 1

## 2020-07-25 MED ORDER — PROCHLORPERAZINE 25 MG RE SUPP
12.5000 mg | Freq: Four times a day (QID) | RECTAL | Status: DC | PRN
Start: 2020-07-25 — End: 2020-08-07

## 2020-07-25 MED ORDER — PROCHLORPERAZINE MALEATE 5 MG PO TABS
5.0000 mg | ORAL_TABLET | Freq: Four times a day (QID) | ORAL | Status: DC | PRN
  Filled 2020-07-25: qty 2

## 2020-07-25 MED ORDER — PANTOPRAZOLE SODIUM 40 MG PO TBEC
40.0000 mg | DELAYED_RELEASE_TABLET | Freq: Every day | ORAL | Status: DC
  Administered 2020-07-26 – 2020-08-07 (×13): 40 mg via ORAL
  Filled 2020-07-25 (×13): qty 1

## 2020-07-25 MED ORDER — LIVING WELL WITH DIABETES BOOK
Freq: Once | Status: DC
  Filled 2020-07-25: qty 1

## 2020-07-25 MED ORDER — POLYETHYLENE GLYCOL 3350 17 G PO PACK
17.0000 g | PACK | Freq: Every day | ORAL | Status: DC | PRN
  Administered 2020-07-26 – 2020-08-06 (×5): 17 g via ORAL
  Filled 2020-07-25 (×5): qty 1

## 2020-07-25 MED ORDER — ASPIRIN EC 81 MG PO TBEC
81.0000 mg | DELAYED_RELEASE_TABLET | Freq: Every day | ORAL | Status: DC
  Administered 2020-07-26 – 2020-08-07 (×13): 81 mg via ORAL
  Filled 2020-07-25 (×13): qty 1

## 2020-07-25 MED ORDER — ATORVASTATIN CALCIUM 40 MG PO TABS
40.0000 mg | ORAL_TABLET | Freq: Every day | ORAL | Status: DC
  Administered 2020-07-26 – 2020-08-07 (×13): 40 mg via ORAL
  Filled 2020-07-25 (×13): qty 1

## 2020-07-25 MED ORDER — IPRATROPIUM-ALBUTEROL 20-100 MCG/ACT IN AERS: 1.0000 | INHALATION_SPRAY | Freq: Four times a day (QID) | RESPIRATORY_TRACT | Status: DC | PRN

## 2020-07-25 MED ORDER — ADULT MULTIVITAMIN W/MINERALS CH
1.0000 | ORAL_TABLET | Freq: Every day | ORAL | Status: DC
  Administered 2020-07-26 – 2020-08-07 (×13): 1 via ORAL
  Filled 2020-07-25 (×13): qty 1

## 2020-07-25 MED ORDER — BISACODYL 10 MG RE SUPP
10.0000 mg | Freq: Every day | RECTAL | Status: DC | PRN
  Filled 2020-07-25: qty 1

## 2020-07-25 MED ORDER — NITROGLYCERIN 0.4 MG SL SUBL: 0.4000 mg | SUBLINGUAL_TABLET | SUBLINGUAL | Status: DC | PRN

## 2020-07-25 MED ORDER — ALLOPURINOL 300 MG PO TABS
300.0000 mg | ORAL_TABLET | Freq: Every day | ORAL | Status: DC
  Administered 2020-07-26 – 2020-08-07 (×13): 300 mg via ORAL
  Filled 2020-07-25 (×13): qty 1

## 2020-07-25 MED ORDER — ENOXAPARIN SODIUM 40 MG/0.4ML IJ SOSY
40.0000 mg | PREFILLED_SYRINGE | Freq: Every day | INTRAMUSCULAR | Status: DC
  Administered 2020-07-25 – 2020-08-06 (×13): 40 mg via SUBCUTANEOUS
  Filled 2020-07-25 (×13): qty 0.4

## 2020-07-25 MED ORDER — ASPIRIN 81 MG PO TBEC: 81.0000 mg | DELAYED_RELEASE_TABLET | Freq: Every day | ORAL | Status: DC

## 2020-07-25 MED ORDER — ENSURE MAX PROTEIN PO LIQD
11.0000 [oz_av] | Freq: Every day | ORAL | Status: DC
  Administered 2020-07-26 – 2020-08-05 (×11): 11 [oz_av] via ORAL

## 2020-07-25 MED ORDER — BIOTIN 5000 MCG PO TABS: 5000.0000 ug | ORAL_TABLET | Freq: Every day | ORAL | Status: DC

## 2020-07-25 MED ORDER — OMEGA-3-ACID ETHYL ESTERS 1 G PO CAPS
1.0000 g | ORAL_CAPSULE | Freq: Two times a day (BID) | ORAL | Status: DC
  Administered 2020-07-25 – 2020-08-07 (×26): 1 g via ORAL
  Filled 2020-07-25 (×26): qty 1

## 2020-07-25 MED ORDER — ALUM & MAG HYDROXIDE-SIMETH 200-200-20 MG/5ML PO SUSP
30.0000 mL | ORAL | Status: DC | PRN
  Administered 2020-07-29: 30 mL via ORAL
  Filled 2020-07-25: qty 30

## 2020-07-25 MED ORDER — MELATONIN 3 MG PO TABS
3.0000 mg | ORAL_TABLET | Freq: Every day | ORAL | Status: DC
  Administered 2020-07-25 – 2020-08-06 (×13): 3 mg via ORAL
  Filled 2020-07-25 (×13): qty 1

## 2020-07-25 MED ORDER — PROSOURCE PLUS PO LIQD
30.0000 mL | Freq: Two times a day (BID) | ORAL | Status: DC
  Administered 2020-07-25 – 2020-08-06 (×25): 30 mL via ORAL
  Filled 2020-07-25 (×24): qty 30

## 2020-07-25 MED ORDER — TICAGRELOR 90 MG PO TABS: 90.0000 mg | ORAL_TABLET | Freq: Two times a day (BID) | ORAL | Status: DC

## 2020-07-25 MED ORDER — MULTI-VITAMIN/MINERALS PO TABS: 1.0000 | ORAL_TABLET | Freq: Every day | ORAL | Status: DC

## 2020-07-25 MED ORDER — HYDRALAZINE HCL 25 MG PO TABS: 25.0000 mg | ORAL_TABLET | Freq: Four times a day (QID) | ORAL | Status: DC | PRN

## 2020-07-25 MED ORDER — FLEET ENEMA 7-19 GM/118ML RE ENEM
1.0000 | ENEMA | Freq: Once | RECTAL | Status: DC | PRN
Start: 2020-07-25 — End: 2020-08-07

## 2020-07-25 MED ORDER — TICAGRELOR 90 MG PO TABS
90.0000 mg | ORAL_TABLET | Freq: Two times a day (BID) | ORAL | Status: DC
  Administered 2020-07-25 – 2020-08-07 (×26): 90 mg via ORAL
  Filled 2020-07-25 (×26): qty 1

## 2020-07-25 MED ORDER — MAGNESIUM OXIDE -MG SUPPLEMENT 400 (240 MG) MG PO TABS
400.0000 mg | ORAL_TABLET | Freq: Two times a day (BID) | ORAL | Status: DC
  Administered 2020-07-25 – 2020-07-26 (×2): 400 mg via ORAL
  Filled 2020-07-25 (×2): qty 1

## 2020-07-25 MED ORDER — PROCHLORPERAZINE EDISYLATE 10 MG/2ML IJ SOLN
5.0000 mg | Freq: Four times a day (QID) | INTRAMUSCULAR | Status: DC | PRN
Start: 2020-07-25 — End: 2020-08-07

## 2020-07-25 MED ORDER — ACETAMINOPHEN 325 MG PO TABS
325.0000 mg | ORAL_TABLET | ORAL | Status: DC | PRN
  Administered 2020-07-26 – 2020-08-06 (×9): 650 mg via ORAL
  Filled 2020-07-25 (×9): qty 2

## 2020-07-25 MED ORDER — BLOOD PRESSURE CONTROL BOOK
Freq: Once | Status: DC
  Filled 2020-07-25: qty 1

## 2020-07-25 MED ORDER — INSULIN GLARGINE 100 UNIT/ML ~~LOC~~ SOLN
100.0000 [IU] | Freq: Every morning | SUBCUTANEOUS | Status: DC
  Administered 2020-07-26 – 2020-07-31 (×6): 100 [IU] via SUBCUTANEOUS
  Filled 2020-07-25 (×6): qty 1

## 2020-07-25 MED ORDER — LORAZEPAM 1 MG PO TABS
0.5000 mg | ORAL_TABLET | Freq: Every day | ORAL | Status: DC | PRN
  Administered 2020-07-27: 0.5 mg via ORAL
  Filled 2020-07-25: qty 1

## 2020-07-25 MED ORDER — INSULIN ASPART 100 UNIT/ML IJ SOLN
0.0000 [IU] | Freq: Three times a day (TID) | INTRAMUSCULAR | Status: DC
  Administered 2020-07-26: 2 [IU] via SUBCUTANEOUS
  Administered 2020-07-26: 5 [IU] via SUBCUTANEOUS
  Administered 2020-07-27: 2 [IU] via SUBCUTANEOUS
  Administered 2020-07-27: 3 [IU] via SUBCUTANEOUS
  Administered 2020-07-28: 5 [IU] via SUBCUTANEOUS
  Administered 2020-07-28: 3 [IU] via SUBCUTANEOUS
  Administered 2020-07-29: 5 [IU] via SUBCUTANEOUS
  Administered 2020-07-29: 8 [IU] via SUBCUTANEOUS
  Administered 2020-07-29 – 2020-07-30 (×3): 5 [IU] via SUBCUTANEOUS
  Administered 2020-07-30: 3 [IU] via SUBCUTANEOUS
  Administered 2020-07-31: 5 [IU] via SUBCUTANEOUS
  Administered 2020-08-01: 3 [IU] via SUBCUTANEOUS
  Administered 2020-08-01: 2 [IU] via SUBCUTANEOUS
  Administered 2020-08-02 (×2): 3 [IU] via SUBCUTANEOUS
  Administered 2020-08-02: 2 [IU] via SUBCUTANEOUS
  Administered 2020-08-03: 8 [IU] via SUBCUTANEOUS
  Administered 2020-08-03: 5 [IU] via SUBCUTANEOUS
  Administered 2020-08-04: 3 [IU] via SUBCUTANEOUS
  Administered 2020-08-04: 5 [IU] via SUBCUTANEOUS
  Administered 2020-08-05: 3 [IU] via SUBCUTANEOUS
  Administered 2020-08-05: 2 [IU] via SUBCUTANEOUS
  Administered 2020-08-05: 3 [IU] via SUBCUTANEOUS
  Administered 2020-08-06: 2 [IU] via SUBCUTANEOUS
  Administered 2020-08-06: 3 [IU] via SUBCUTANEOUS
  Administered 2020-08-06 – 2020-08-07 (×2): 5 [IU] via SUBCUTANEOUS

## 2020-07-25 MED ORDER — GUAIFENESIN-DM 100-10 MG/5ML PO SYRP: 5.0000 mL | ORAL_SOLUTION | Freq: Four times a day (QID) | ORAL | Status: DC | PRN

## 2020-07-25 MED ORDER — TRAZODONE HCL 50 MG PO TABS
25.0000 mg | ORAL_TABLET | Freq: Every evening | ORAL | Status: DC | PRN
  Administered 2020-07-27: 50 mg via ORAL
  Filled 2020-07-25 (×2): qty 1

## 2020-07-25 MED ORDER — TRAMADOL HCL 50 MG PO TABS
50.0000 mg | ORAL_TABLET | Freq: Four times a day (QID) | ORAL | Status: DC | PRN
  Administered 2020-07-25 – 2020-07-27 (×6): 50 mg via ORAL
  Filled 2020-07-25 (×8): qty 1

## 2020-07-25 MED ORDER — DIPHENHYDRAMINE HCL 12.5 MG/5ML PO ELIX: 12.5000 mg | ORAL_SOLUTION | Freq: Four times a day (QID) | ORAL | Status: DC | PRN

## 2020-07-25 NOTE — H&P (Signed)
Physical Medicine and Rehabilitation Admission H&P        Chief Complaint  Patient presents with  . Functional deficits due to stroke.       HPI: Mitchell Lambert is a 70 year old male with history of CKD II, CAD, T2DM, multiple renal calculi (Dr. 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  He was admitted on 07/21/2020 with left facial droop, dizziness with nausea and double vision.  UDS negative.  CT head negative and MRI brain was done revealing small acute infarct in left pontine medullary junction and left cerebral white matter as well as small remote left parietal infarct.  He reported symptoms started the day before and was out of window for tPA.  Patient had recent TEE done 05/2020 that showed EF of 55 to 60% and negative bubble study therefore was not got repeated and loop recorder was placed.  Carotid Dopplers were negative for significant ICA stenosis.     Dr. Leonie Man expressed concerns of cardioembolic stroke and recommended DAPT of ASA and Brilinta x4 weeks followed by ASA alone, risks factor modification and interrogation of loop recorder to rule out A. Fib.  He has had issues with bradycardia and bisoprolol was discontinued.  BP meds on hold to avoid hypoperfusion. Therapy initiated and patient noted to have right lower extremity ataxia with balance deficits as well as tendency for leg to buckle, mild cognitive deficits with delayed recall and processing, diplopia as well as right upper extremity ataxia with sensory deficits affecting ADLs and mobility.  CIR was recommended due to functional decline.   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  R foot feels like concrete, can't move it.  Cramping in R foot cannot feed self on right hand/  Double vision better to R and worse to left.  Has loop recorder.  Up 2-3x/night from lack of bladder  emptying- is worse, but had for months.  LBM yesterday Review of Systems  Constitutional: Negative for chills and fever.  HENT: Negative for hearing loss.   Eyes: Positive for double vision (on left to left field).  Respiratory: Negative for cough, shortness of breath and stridor.   Cardiovascular: Positive for leg swelling (Right ankle tends to swell). Negative for chest pain and palpitations.  Gastrointestinal: Negative for abdominal pain, nausea and vomiting.  Genitourinary: Negative for dysuria.  Musculoskeletal: Negative for back pain, joint pain and myalgias.  Skin: Negative for rash.  Neurological: Positive for sensory change (feet feel heavy and hurt at the end of the day from ankles down), weakness and headaches. Negative for dizziness.  All other systems reviewed and are negative.         Past Medical History:  Diagnosis Date  . Angina      NO ANGINA SINCE CABG  . Arthritis      right ankle  . Chronic daily headache      "@ least every other day""improved since heart surgery"  . Coronary artery disease      PT STATES HEART DOING WELL SINCE CABG SURGERY - DR. TILLEY IS HIS CARDIOLOGIST  . Diabetes mellitus      Lantus x 10 yrs  . Gout    . History of kidney stones 09-08-12    multiple times with some lithotripsies  . Hyperlipemia    . Hypertension  Past Surgical History:  Procedure Laterality Date  . BACK SURGERY        microsurgery: spinal stenosis -lumbar  . bone spur   1990's    right great toe; "probably related to gout"  . BUBBLE STUDY   06/17/2020    Procedure: BUBBLE STUDY;  Surgeon: Lelon Perla, MD;  Location: [place];  Service: Cardiovascular;;  . CARDIAC CATHETERIZATION   04/27/11  . CATARACT EXTRACTION W/ INTRAOCULAR LENS IMPLANT   1990's    right; "lens was replaced twice over 3 months; lens is actually over iris"  . CORONARY ARTERY BYPASS GRAFT   04/30/2011    Procedure: CORONARY ARTERY BYPASS GRAFTING (CABG);  Surgeon: Melrose Nakayama, MD;  Location: [place];  Service: Open Heart Surgery;  Laterality: N/A;,x4 vessels  . CYSTOSCOPY W/ URETERAL STENT PLACEMENT Right 08/19/2012    Procedure: CYSTOSCOPY WITH RETROGRADE PYELOGRAM/URETERAL STENT PLACEMENT;  Surgeon: Molli Hazard, MD;  Location: [facility];  Service: Urology;  Laterality: Right;  . CYSTOSCOPY WITH RETROGRADE PYELOGRAM, URETEROSCOPY AND STENT PLACEMENT Left 07/17/2013    Procedure: CYSTOSCOPY, LEFT URETEROSCOPY, BASKET EXTRACTION OF STONE, INSERTION OF LEFT URETERAL STENT, ;  Surgeon: Jorja Loa, MD;  Location: [facility];  Service: Urology;  Laterality: Left;  . CYSTOSCOPY/RETROGRADE/URETEROSCOPY Right 09/12/2012    Procedure: CYSTOSCOPY/RETROGRADE/URETEROSCOPY;  Surgeon: Franchot Gallo, MD;  Location[facility];  Service: Urology;  Laterality: Right;  . HOLMIUM LASER APPLICATION Right 1/75/1025    Procedure: HOLMIUM LASER APPLICATION;  Surgeon: Franchot Gallo, MD;  Location[facility];  Service: Urology;  Laterality: Right;  . HOLMIUM LASER APPLICATION Left 8/52/7782    Procedure: HOLMIUM LASER APPLICATION;  Surgeon: Jorja Loa, MD;  Location[facility];  Service: Urology;  Laterality: Left;  . KIDNEY STONE SURGERY        "probably 3 times"  . LEFT HEART CATHETERIZATION WITH CORONARY ANGIOGRAM N/A 04/28/2011    Procedure: LEFT HEART CATHETERIZATION WITH CORONARY ANGIOGRAM;  Surgeon: Jacolyn Reedy, MD;  Location: [facility] CATH LAB;  Service: Cardiovascular;  Laterality: N/A;  . LITHOTRIPSY        "many; probably 5 times"  . RADIAL ARTERY HARVEST   04/30/2011    Procedure: RADIAL ARTERY HARVEST;  Surgeon: Melrose Nakayama, MD;  Location: [place];  Service: Open Heart Surgery;  Laterality: Left;  . RETINAL DETACHMENT SURGERY   1990's    right eye; "made me have an early cataract"  . TEE WITHOUT CARDIOVERSION N/A 06/17/2020    Procedure: TRANSESOPHAGEAL ECHOCARDIOGRAM (TEE);  Surgeon: Lelon Perla, MD;  Location: [facility] ENDOSCOPY;  Service:  Cardiovascular;  Laterality: N/A;  . VASECTOMY               Family History  Problem Relation Age of Onset  . Cataracts Father    . Cataracts Mother    . Diabetes Paternal Aunt    . Diabetes Paternal Grandmother    . Amblyopia Neg Hx    . Blindness Neg Hx    . Glaucoma Neg Hx    . Macular degeneration Neg Hx    . Retinal detachment Neg Hx    . Strabismus Neg Hx    . Retinitis pigmentosa Neg Hx        Social History: Married.  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  Reports that he has never smoked. He has never used smokeless tobacco. He reports current alcohol use. He reports current drug use. Drug: Marijuana.  Allergies  Allergen Reactions  . Gabapentin Other (See Comments)      "made me so dizzy I missed work for 2 days"  . Metformin And Related Shortness Of Breath  . Sulfa Antibiotics Shortness Of Breath and Rash  . Azithromycin Other (See Comments)      not good for heart condition  . Ciprofloxacin Other (See Comments)      tendon rupture  . Lisinopril Cough  . Losartan Potassium Other (See Comments)      hypotension  . Olmesartan Cough  . Simvastatin Cough  . Amoxicillin Rash and Other (See Comments)      rash Did it involve swelling of the face/tongue/throat, SOB, or low BP? N Did it involve sudden or severe rash/hives, skin peeling, or any reaction on the inside of your mouth or nose?  Y Did you need to seek medical attention at a hospital or doctor's office? N When did it last happen?  "years and years ago"     If all above answers are "NO", may proceed with cephalosporin use.    Marland Kitchen Doxycycline Rash  . Penicillins Rash            Medications Prior to Admission  Medication Sig Dispense Refill  . acetaminophen (TYLENOL) 325 MG tablet Take 325-650 mg by mouth every 6 (six) hours as needed for mild pain, fever or headache.      . allopurinol (ZYLOPRIM) 300 MG tablet Take 300 mg by mouth daily.      Marland Kitchen amLODipine  (NORVASC) 5 MG tablet TAKE 1 TABLET(5 MG) BY MOUTH DAILY (Patient taking differently: Take 5 mg by mouth daily.) 90 tablet 3  . atorvastatin (LIPITOR) 40 MG tablet Take 40 mg by mouth daily.      . Biotin 5000 MCG TABS Take 5,000 mcg by mouth daily.       . bisoprolol (ZEBETA) 5 MG tablet Take 5 mg by mouth daily.      . clopidogrel (PLAVIX) 75 MG tablet Take 1 tablet (75 mg total) by mouth daily. 30 tablet 0  . fish oil-omega-3 fatty acids 1000 MG capsule Take 1 g by mouth 2 (two) times daily.      Marland Kitchen glucosamine-chondroitin 500-400 MG tablet Take 2 tablets by mouth daily.      Marland Kitchen ibuprofen (ADVIL) 200 MG tablet Take 200 mg by mouth every 6 (six) hours as needed for fever, headache or mild pain.      Marland Kitchen insulin aspart (NOVOLOG FLEXPEN) 100 UNIT/ML FlexPen Inject 10-15 Units into the skin as needed for high blood sugar.      . Insulin Glargine (BASAGLAR KWIKPEN) 100 UNIT/ML SOPN Inject 100 Units into the skin in the morning. In AM      . irbesartan (AVAPRO) 300 MG tablet Take 1 tablet (300 mg total) by mouth daily. 90 tablet 0  . magnesium oxide (MAG-OX) 400 MG tablet Take 400 mg by mouth 2 (two) times daily.       . Multiple Vitamins-Minerals (MULTIVITAMIN WITH MINERALS) tablet Take 1 tablet by mouth daily.      . nitroGLYCERIN (NITROSTAT) 0.4 MG SL tablet Place 0.4 mg under the tongue as needed for chest pain.      Marland Kitchen oxymetazoline (AFRIN) 0.05 % nasal spray Place 1 spray into both nostrils at bedtime as needed for congestion.      . traMADol (ULTRAM) 50 MG tablet Take 50 mg by mouth daily.      Marland Kitchen ACCU-CHEK GUIDE test strip USE  TO TEST BLOOD SUGAR 2 TO 3 TIMES A DAY   5  . Alirocumab (PRALUENT) 150 MG/ML SOAJ Inject 150 mg into the skin every 14 (fourteen) days. (Patient not taking: Reported on 07/22/2020) 2 mL 11  . B-D ULTRAFINE III SHORT PEN 31G X 8 MM MISC USE UTD WITH FLEXPEN.   3      Drug Regimen Review  Drug regimen was reviewed and remains appropriate with no significant issues  identified   Home: Home Living Family/patient expects to be discharged to:: Private residence Living Arrangements: Spouse/significant other Available Help at Discharge: Family,Available 24 hours/day Type of Home: House Home Access: Stairs to enter [employer] of Steps: 5 Entrance Stairs-Rails: None Home Layout: Two level Alternate Level Stairs-Number of Steps: 12 Alternate Level Stairs-Rails: Left Bathroom Shower/Tub: Chiropodist: Standard Bathroom Accessibility: Yes Home Equipment: None   Functional History: Prior Function Level of Independence: Independent Comments: 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 STS x2. Ambulation/Gait Ambulation/Gait assistance: Min assist,Mod assist Gait Distance (Feet): 80 Feet (+50) Assistive device: Quad cane,1 person hand held assist Gait Pattern/deviations: Step-to pattern,Decreased step length - right,Trunk flexed,Narrow base of support General Gait Details: Min assist to steady, posterior knee facilitation to prevent frequent genu recurvatum, correct LOB especially when challenged (side-stepping, directional changes). First gait attempt with chair rail in hallway and HHA, converted to quad cane only. VC for sequencing with cane, step-to gait. Gait velocity: decr Gait velocity interpretation: <1.8 ft/sec, indicate of risk for recurrent falls   ADL: ADL Overall ADL's : Needs assistance/impaired Eating/Feeding: Set up Eating/Feeding Details (indicate cue type and reason): dropping utensils with R hand; May benefit form red  tubing Grooming: Set up,Supervision/safety,Sitting Upper Body Bathing: Set up,Sitting Lower Body Bathing: Sit to/from stand,Minimal assistance Upper Body Dressing : Set up,Supervision/safety,Sitting Lower Body Dressing: Minimal assistance,Sit to/from stand Toilet Transfer: Minimal assistance,Ambulation Toileting- Clothing Manipulation and Hygiene: Min guard,Sit to/from stand Functional mobility during ADLs: Minimal assistance (unsteady; posteiror LOB)   Cognition: Cognition Overall Cognitive Status: Impaired/Different from baseline Orientation Level: Oriented X4 Cognition Arousal/Alertness: Awake/alert Behavior During Therapy: WFL for tasks assessed/performed Overall Cognitive Status: Impaired/Different from baseline Area of Impairment: Safety/judgement,Awareness,Attention Current Attention Level: Selective Safety/Judgement: Decreased awareness of deficits,Decreased awareness of safety Awareness: Emergent General Comments: Follows cues well, however requires max repeated cuing for form and safety     Blood pressure (!) 147/70, pulse (!) 51, temperature 97.9 F (36.6 C), temperature source Oral, resp. rate 20, height 6' (1.829 m), weight 108.9 kg, SpO2 97 %. Physical Exam Vitals and nursing note reviewed. Exam conducted with a chaperone present.  Constitutional:      Comments: vitiliginous changes along hairline.  Sitting up EOB- able to get there on own; wife at bedside; a little dazed- just woke up, NAD  HENT:     Head: Normocephalic.     Comments: R facial droop; tongue midline    Right Ear: External ear normal.     Left Ear: External ear normal.     Nose: Nose normal. No congestion.     Mouth/Throat:     Mouth: Mucous membranes are moist.     Pharynx:  Oropharynx is clear. No oropharyngeal exudate.  Eyes:     General:        Right eye: No discharge.        Left eye: No discharge.     Comments: EOMs ok on R- full ROM of R eye, but L cannot get fully L- dysconjugate  gaze L>R lid lag- better per wife  Cardiovascular:     Rate and Rhythm: Normal rate and regular rhythm.     Heart sounds: Normal heart sounds. No murmur heard. No gallop.   Pulmonary:     Comments: CTA B/L- no W/R/R- good air movement Abdominal:     General: There is no distension.     Comments: Soft, NT, ND, (+)BS    Musculoskeletal:     Cervical back: Normal range of motion.     Comments: 2+ right pedal edema.  RUE- biceps 4-/5, triceps 4+/5, grip 4/5, FA 2-/5 LUE 5/5 in same muscles RLE- HF 3-/5, KE 3-/5, KF 3/5, DF 0-1/5; PF 2-/5  Skin:    General: Skin is warm and dry.     Comments: IV R wrist- is OK- no skin breakdown seen  Neurological:     Mental Status: He is alert and oriented to person, place, and time.     Comments: Right facial droop without dysarthria. Speech slow and measured. Left ptosis with diplopia to left field and split vision in left lower field. Right hemiplegia with decrease in fine motor coordination.  Facial sensation intact Light touch intact x 4 extremities Apraxic with RUE Word finding issues mild to moderate  Psychiatric:        Mood and Affect: Mood normal.        Behavior: Behavior normal.        Lab Results Last 48 Hours        Results for orders placed or performed during the hospital encounter of 07/21/20 (from the past 48 hour(s))  Glucose, capillary     Status: Abnormal    Collection Time: 07/23/20 12:01 PM  Result Value Ref Range    Glucose-Capillary 160 (H) 70 - 99 mg/dL      Comment: Glucose reference range applies only to samples taken after fasting for at least 8 hours.  Glucose, capillary     Status: Abnormal    Collection Time: 07/23/20  3:50 PM  Result Value Ref Range    Glucose-Capillary 174 (H) 70 - 99 mg/dL      Comment: Glucose reference range applies only to samples taken after fasting for at least 8 hours.  Glucose, capillary     Status: Abnormal    Collection Time: 07/23/20  9:11 PM  Result Value Ref Range     Glucose-Capillary 144 (H) 70 - 99 mg/dL      Comment: Glucose reference range applies only to samples taken after fasting for at least 8 hours.  Magnesium     Status: None    Collection Time: 07/24/20  3:13 AM  Result Value Ref Range    Magnesium 1.8 1.7 - 2.4 mg/dL      Comment: Performed at [facility] [address] [place], [place] [address]  Phosphorus     Status: None    Collection Time: 07/24/20  3:13 AM  Result Value Ref Range    Phosphorus 3.7 2.5 - 4.6 mg/dL      Comment: Performed at [facility] [address] [place],  [address]  CBC  Status: None    Collection Time: 07/24/20  3:13 AM  Result Value Ref Range    WBC 7.7 4.0 - 10.5 K/uL    RBC 4.26 4.22 - 5.81 MIL/uL    Hemoglobin 13.4 13.0 - 17.0 g/dL    HCT 39.2 39.0 - 52.0 %    MCV 92.0 80.0 - 100.0 fL    MCH 31.5 26.0 - 34.0 pg    MCHC 34.2 30.0 - 36.0 g/dL    RDW 13.2 11.5 - 15.5 %    Platelets 206 150 - 400 K/uL    nRBC 0.0 0.0 - 0.2 %      Comment: Performed at [facility], [place] [address] [place], [place] [address]  Basic metabolic panel     Status: Abnormal    Collection Time: 07/24/20  3:13 AM  Result Value Ref Range    Sodium 140 135 - 145 mmol/L    Potassium 4.3 3.5 - 5.1 mmol/L    Chloride 109 98 - 111 mmol/L    CO2 26 22 - 32 mmol/L    Glucose, Bld 104 (H) 70 - 99 mg/dL      Comment: Glucose reference range applies only to samples taken after fasting for at least 8 hours.    BUN 28 (H) 8 - 23 mg/dL    Creatinine, Ser 1.81 (H) 0.61 - 1.24 mg/dL    Calcium 8.8 (L) 8.9 - 10.3 mg/dL    GFR, Estimated 40 (L) >60 mL/min      Comment: (NOTE) Calculated using the CKD-EPI Creatinine Equation (2021)      Anion gap 5 5 - 15      Comment: Performed at [facility] [address] [place], [place] [address]  Glucose, capillary     Status: None    Collection Time: 07/24/20  6:38 AM  Result Value Ref Range    Glucose-Capillary 91 70 - 99 mg/dL      Comment: Glucose  reference range applies only to samples taken after fasting for at least 8 hours.  Glucose, capillary     Status: Abnormal    Collection Time: 07/24/20 12:40 PM  Result Value Ref Range    Glucose-Capillary 106 (H) 70 - 99 mg/dL      Comment: Glucose reference range applies only to samples taken after fasting for at least 8 hours.  Glucose, capillary     Status: Abnormal    Collection Time: 07/24/20  4:50 PM  Result Value Ref Range    Glucose-Capillary 185 (H) 70 - 99 mg/dL      Comment: Glucose reference range applies only to samples taken after fasting for at least 8 hours.  Glucose, capillary     Status: Abnormal    Collection Time: 07/24/20  9:16 PM  Result Value Ref Range    Glucose-Capillary 138 (H) 70 - 99 mg/dL      Comment: Glucose reference range applies only to samples taken after fasting for at least 8 hours.  Basic metabolic panel     Status: Abnormal    Collection Time: 07/25/20  3:43 AM  Result Value Ref Range    Sodium 141 135 - 145 mmol/L    Potassium 4.0 3.5 - 5.1 mmol/L    Chloride 109 98 - 111 mmol/L    CO2 26 22 - 32 mmol/L    Glucose, Bld 89 70 - 99 mg/dL      Comment: Glucose reference range applies only to samples taken  after fasting for at least 8 hours.    BUN 33 (H) 8 - 23 mg/dL    Creatinine, Ser 2.02 (H) 0.61 - 1.24 mg/dL    Calcium 8.9 8.9 - 10.3 mg/dL    GFR, Estimated 35 (L) >60 mL/min      Comment: (NOTE) Calculated using the CKD-EPI Creatinine Equation (2021)      Anion gap 6 5 - 15      Comment: Performed at [facility] [address], [place], [place] [address]  Glucose, capillary     Status: Abnormal    Collection Time: 07/25/20  6:33 AM  Result Value Ref Range    Glucose-Capillary 69 (L) 70 - 99 mg/dL      Comment: Glucose reference range applies only to samples taken after fasting for at least 8 hours.  Glucose, capillary     Status: None    Collection Time: 07/25/20  7:03 AM  Result Value Ref Range    Glucose-Capillary 77 70 -  99 mg/dL      Comment: Glucose reference range applies only to samples taken after fasting for at least 8 hours.       Imaging Results (Last 48 hours)  VAS US CAROTID   Result Date: 07/24/2020 Carotid Arterial Duplex Study Patient Name:  TRAN ARZUAGA  Date of Exam:   07/24/2020 Medical Rec #: [id number]        Accession #:    [id number] Date of Birth: 1950/08/25        Patient Gender: M Patient Age:   69Y Exam Location:  [facility] Procedure:      VAS US CAROTID Referring Phys: [id number] [facility] [place] --------------------------------------------------------------------------------  Indications:      CVA. Risk Factors:     Hypertension, hyperlipidemia, Diabetes, coronary artery                   disease. Comparison Study: no prior Performing Technologist: Darlin Coco RDMS,RVT  Examination Guidelines: A complete evaluation includes B-mode imaging, spectral Doppler, color Doppler, and power Doppler as needed of all accessible portions of each vessel. Bilateral testing is considered an integral part of a complete examination. Limited examinations for reoccurring indications may be performed as noted.  Right Carotid Findings: +----------+--------+--------+--------+------------------+--------+           PSV cm/sEDV cm/sStenosisPlaque DescriptionComments +----------+--------+--------+--------+------------------+--------+ CCA Prox  107     13              heterogenous               +----------+--------+--------+--------+------------------+--------+ CCA Distal118     23              heterogenous               +----------+--------+--------+--------+------------------+--------+ ICA Prox  92      23      1-39%   heterogenous               +----------+--------+--------+--------+------------------+--------+ ICA Distal71      24                                         +----------+--------+--------+--------+------------------+--------+ ECA       114     15                                          +----------+--------+--------+--------+------------------+--------+ +----------+--------+-------+--------+-------------------+  PSV cm/sEDV cmsDescribeArm Pressure (mmHG) +----------+--------+-------+--------+-------------------+ Subclavian127                                        +----------+--------+-------+--------+-------------------+ +---------+--------+--+--------+--+---------+ VertebralPSV cm/s66EDV cm/s17Antegrade +---------+--------+--+--------+--+---------+  Left Carotid Findings: +----------+--------+--------+--------+------------------+--------+           PSV cm/sEDV cm/sStenosisPlaque DescriptionComments +----------+--------+--------+--------+------------------+--------+ CCA Prox  124     28              heterogenous               +----------+--------+--------+--------+------------------+--------+ CCA Distal86      23              heterogenous               +----------+--------+--------+--------+------------------+--------+ ICA Prox  83      29      1-39%   heterogenous               +----------+--------+--------+--------+------------------+--------+ ICA Distal73      16                                         +----------+--------+--------+--------+------------------+--------+ ECA       107     17                                         +----------+--------+--------+--------+------------------+--------+ +----------+--------+--------+--------+-------------------+           PSV cm/sEDV cm/sDescribeArm Pressure (mmHG) +----------+--------+--------+--------+-------------------+ Subclavian127                                         +----------+--------+--------+--------+-------------------+ +---------+--------+--+--------+--+---------+ VertebralPSV cm/s48EDV cm/s11Antegrade +---------+--------+--+--------+--+---------+   Summary: Right Carotid: Velocities in the right ICA are consistent with a  1-39% stenosis. Left Carotid: Velocities in the left ICA are consistent with a 1-39% stenosis. Vertebrals: Bilateral vertebral arteries demonstrate antegrade flow. *See table(s) above for measurements and observations.     Preliminary     VAS [place] TRANSCRANIAL DOPPLER   Result Date: 07/24/2020  Transcranial Doppler Patient Name:  MANINDER DEBOER  Date of Exam:   07/24/2020 Medical Rec #: [id number]        Accession #:    [id number] Date of Birth: 1950-10-17        Patient Gender: M Patient Age:   69Y Exam [facility]:  [facility] Procedure:      VAS [place] TRANSCRANIAL DOPPLER Referring Phys: [id number] [facility] [place] --------------------------------------------------------------------------------  Indications: Stroke. Limitations for diagnostic windows: Unable to insonate left transtemporal window. Comparison Study: no prior Performing Technologist: Abram Sander RVS  Examination Guidelines: A complete evaluation includes B-mode imaging, spectral Doppler, color Doppler, and power Doppler as needed of all accessible portions of each vessel. Bilateral testing is considered an integral part of a complete examination. Limited examinations for reoccurring indications may be performed as noted.  +----------+-------------+----------+-----------+-------+ RIGHT TCD Right VM (cm)Depth (cm)PulsatilityComment +----------+-------------+----------+-----------+-------+ MCA           23.00                 2.01            +----------+-------------+----------+-----------+-------+  PCA           41.00                 1.14            +----------+-------------+----------+-----------+-------+ Opthalmic     25.00                 1.38            +----------+-------------+----------+-----------+-------+ ICA siphon    34.00                 1.58            +----------+-------------+----------+-----------+-------+ Vertebral    -22.00                 0.84             +----------+-------------+----------+-----------+-------+  +----------+------------+----------+-----------+-------+ LEFT TCD  Left VM (cm)Depth (cm)PulsatilityComment +----------+------------+----------+-----------+-------+ Opthalmic    25.00                 1.43            +----------+------------+----------+-----------+-------+ ICA siphon   30.00                 1.51            +----------+------------+----------+-----------+-------+ Vertebral    -17.00                1.06            +----------+------------+----------+-----------+-------+  +------------+-------+-------+             VM cm/sComment +------------+-------+-------+ Prox Basilar-20.00         +------------+-------+-------+ Dist Basilar-21.00         +------------+-------+-------+    Preliminary              Medical Problem List and Plan: 1.  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  Antithrombotics: -DVT/anticoagulation:  Pharmaceutical: Lovenox             -antiplatelet therapy: On ASA/Brilinta x4 weeks followed by ASA alone. 3. Pain Management: Tramadol as needed 4. Mood: LCSW to follow for evaluation and support             -antipsychotic agents: N/AA 5. Neuropsych: This patient is capable of making decisions on his own behalf. 6. Skin/Wound Care: Routine pressure-relief measures 7. Fluids/Electrolytes/Nutrition: Monitor I's/O.  Check electrolytes in a.m. 8.  HTN: Monitor blood pressures twice daily.  Avoid hypotension to avoid hypoperfusion.             -- Amlodipine and irbesartan have been on hold.  9.  T2DM: Hgb A1C- 8.8. Poorly controlled.              --Monitor blood sugars ac/hs and use SSI for elevated blood sugars.             -- Continue insulin glargine 100 units as per home regimen.   10.  CKD IIIb: Baseline BUN/SCR 31/1.77-->33/2.02.  Encourage fluid intake.              --Patient educated on avoiding NSAIDs.              -- Recheck labs in am and monitor renal status  with serial checks. 11.  History of gout: Continue allopurinol. Monitor for flares.  12.  CAD s/p CABG: Monitor for symptoms with increased activity. Continue ASA, fish oil and Lipitor.             -- Off bisoprolol due to bradycardia. 13. Low protein stores: Will add prosource and Ensure Max for supplement.  14. Chronic hypomagnesemia: Continue supplement.   15. R foot drop- order PRAFO for RLE- might also help muscle cramping in R foot       Bary Leriche, PA-C 07/25/2020    I have personally performed a face to face diagnostic evaluation of this patient and formulated the key components of the plan.  Additionally, I have personally reviewed laboratory data, imaging studies, as well as relevant notes and concur with the physician assistant's documentation above.   The patient's status has not changed from the original H&P.  Any changes in documentation from the acute care chart have been noted above.

## 2020-07-25 NOTE — Progress Notes (Signed)
Stroke Team Progress Note  SUBJECTIVE Patient is sitting up in bed.  States is doing well.  He has no complaints.  Carotid ultrasound shows no significant extracranial stenosis.  Physical therapy recommends inpatient rehab.  Patient is participating in the sleep smart study and tested positive for sleep apnea on the overnight NOx 3 monitor.  He will have the CPAP mask titration tonight. Vital signs are stable.  Neurological exam is unchanged. OBJECTIVE Most recent Vital Signs: Temp: 98.1 F (36.7 C) (05/26 1146) Temp Source: Oral (05/26 1146) BP: 143/86 (05/26 1146) Pulse Rate: 58 (05/26 1146) Respiratory Rate: 18 O2 Saturdation: 97%  CBG (last 3)  Recent Labs    07/25/20 0633 07/25/20 0703 07/25/20 1146  GLUCAP 69* 77 134*       Studies:   CT head No acute intracranial hemorrhage or infarct  MRI  Small acute infarcts at the left pontine medullary junction and left cerebral white matter.  Small remote left parietal infarct. Chronic lacunar infarct the left thalamus.  Carotid Doppler   no significant extracranial stenosis  2D Echo  not done as patient had TEE on 06/17/2020 which was normal  LDL 88  HgbA1c 8.5   Lovenox for VTE prophylaxis  Loop recorder placed 06/18/2020, interrogate today  Diet: heart healthy          clopidogrel 75 mg daily prior to admission, now on aspirin 81 mg daily and Brilinta (ticagrelor) 90 mg bid. DAPT for 4 weeks then aspirin alone.  Therapy recommendations:  CIR  Disposition:  Pending   Physical Exam:    Pleasant elderly Caucasian male not in distress. General -pleasant mildly obese elderly Caucasian male, in no apparent distress. Ophthalmologic - fundi not visualized due to noncooperation. Cardiovascular - Regular rate and rhythm.  Mental Status -  Level of arousal and orientation to time, place, and person were intact. Language including expression, naming, repetition, comprehension was assessed and found intact. Attention  span and concentration were normal. Recent and remote memory were intact. Fund of Knowledge was assessed and was intact.  Cranial Nerves II - XII - II - Visual field intact OU.  No peripheral visual field cut. III, IV, VI - Extraocular movements show slight restriction of left lateral movement in the left eye only with subjective diplopia V - Facial sensation intact bilaterally. VII - left facial droop VIII - Hearing & vestibular intact bilaterally. X - Palate elevates symmetrically. XI - Chin turning & shoulder shrug intact bilaterally. XII - Tongue protrusion intact.  Motor Strength - Left upper/lower extremities 5/5 and pronator drift was absent. Right upper extremity 4+/5, hand grip 4+, right lower extremity 5/5 Bulk was normal and fasciculations were absent.  Diminished fine finger movements on the right.  Orbits left over right upper extremity.  Mild weakness of right grip and right triceps.  Mild weakness of right ankle dorsiflexors. Motor Tone - Muscle tone was assessed at the neck and appendages and was normal.  Sensory - Light touch, temperature/pinprick were assessed and were symmetrical.    Coordination - The patient had normal movements in the hands and feet with no ataxia or dysmetria.  Tremor was absent.  Gait and Station - deferred.   NIH stroke scale 2.  Premorbid modified Rankin scale 1. ASSESSMENT [name] is a 70 y.o. male with left pontine medullary junction and left cerebral white matter infarcts likely from embolic source.    Hospital day # 3  TREATMENT/PLAN Continue aspirin and Brilinta for 4  weeks followed by aspirin alone and aggressive risk factor modification.  Continue ongoing physical Occupational Therapy and transfer to inpatient rehab when bed available.  Patient will have CPAP mask titration trial tonight for the sleep smart study.  Discussed with patient and Dr. Ree Kida and answered questions  I spent 25  minutes in total face-to-face  time with the patient, more than 50% of which was spent in counseling and coordination of care, reviewing test results, reviewing medication and discussing or reviewing the diagnosis of    , the prognosis and treatment options.   Antony Contras, MD Medical Director [facility] Pager: [phone] 07/25/2020 1:21 PM

## 2020-07-25 NOTE — Progress Notes (Signed)
Orthopedic Tech Progress Note Patient Details:  DYSHON PHILBIN May 08, 1950 [id number] Called in order to [facility] Patient ID: Gilmore Laroche, male   DOB: 12/20/1950, 70 y.o.   MRN: [id number]   Chip Boer 07/25/2020, 6:48 PM

## 2020-07-25 NOTE — Care Management Important Message (Signed)
Important Message  Patient Details  Name: Mitchell Lambert MRN: [id number] Date of Birth: 11-19-1950   Medicare Important Message Given:  Yes     Orbie Pyo 07/25/2020, 3:06 PM

## 2020-07-25 NOTE — H&P (Signed)
Physical Medicine and Rehabilitation Admission H&P    Chief Complaint  Patient presents with  . Functional deficits due to stroke.     HPI: Mitchell Lambert is a 70 year old male with history of CKD II, CAD, T2DM, multiple renal calculi (Dr. 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  He was admitted on 07/21/2020 with left facial droop, dizziness with nausea and double vision.  UDS negative.  CT head negative and MRI brain was done revealing small acute infarct in left pontine medullary junction and left cerebral white matter as well as small remote left parietal infarct.  He reported symptoms started the day before and was out of window for tPA.  Patient had recent TEE done 05/2020 that showed EF of 55 to 60% and negative bubble study therefore was not got repeated and loop recorder was placed.  Carotid Dopplers were negative for significant ICA stenosis.    Dr. Leonie Man expressed concerns of cardioembolic stroke and recommended DAPT of ASA and Brilinta x4 weeks followed by ASA alone, risks factor modification and interrogation of loop recorder to rule out A. Fib.  He has had issues with bradycardia and bisoprolol was discontinued.  BP meds on hold to avoid hypoperfusion. Therapy initiated and patient noted to have right lower extremity ataxia with balance deficits as well as tendency for leg to buckle, mild cognitive deficits with delayed recall and processing, diplopia as well as right upper extremity ataxia with sensory deficits affecting ADLs and mobility.  CIR was recommended due to functional decline.  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  R foot feels like concrete, can't move it.  Cramping in R foot cannot feed self on right hand/  Double vision better to R and worse to left.  Has loop recorder.  Up 2-3x/night from lack of bladder emptying- is  worse, but had for months.  LBM yesterday Review of Systems  Constitutional: Negative for chills and fever.  HENT: Negative for hearing loss.   Eyes: Positive for double vision (on left to left field).  Respiratory: Negative for cough, shortness of breath and stridor.   Cardiovascular: Positive for leg swelling (Right ankle tends to swell). Negative for chest pain and palpitations.  Gastrointestinal: Negative for abdominal pain, nausea and vomiting.  Genitourinary: Negative for dysuria.  Musculoskeletal: Negative for back pain, joint pain and myalgias.  Skin: Negative for rash.  Neurological: Positive for sensory change (feet feel heavy and hurt at the end of the day from ankles down), weakness and headaches. Negative for dizziness.  All other systems reviewed and are negative.    Past Medical History:  Diagnosis Date  . Angina    NO ANGINA SINCE CABG  . Arthritis    right ankle  . Chronic daily headache    "@ least every other day""improved since heart surgery"  . Coronary artery disease    PT STATES HEART DOING WELL SINCE CABG SURGERY - DR. TILLEY IS HIS CARDIOLOGIST  . Diabetes mellitus    Lantus x 10 yrs  . Gout   . History of kidney stones 09-08-12   multiple times with some lithotripsies  . Hyperlipemia   . Hypertension     Past Surgical History:  Procedure Laterality Date  . BACK SURGERY     microsurgery: spinal stenosis -lumbar  . bone spur  1990's  right great toe; "probably related to gout"  . BUBBLE STUDY  06/17/2020   Procedure: BUBBLE STUDY;  Surgeon: Lelon Perla, MD;  Location: [place];  Service: Cardiovascular;;  . CARDIAC CATHETERIZATION  04/27/11  . CATARACT EXTRACTION W/ INTRAOCULAR LENS IMPLANT  1990's   right; "lens was replaced twice over 3 months; lens is actually over iris"  . CORONARY ARTERY BYPASS GRAFT  04/30/2011   Procedure: CORONARY ARTERY BYPASS GRAFTING (CABG);  Surgeon: Melrose Nakayama, MD;  Location: [place];  Service: Open  Heart Surgery;  Laterality: N/A;,x4 vessels  . CYSTOSCOPY W/ URETERAL STENT PLACEMENT Right 08/19/2012   Procedure: CYSTOSCOPY WITH RETROGRADE PYELOGRAM/URETERAL STENT PLACEMENT;  Surgeon: Molli Hazard, MD;  Location: [facility];  Service: Urology;  Laterality: Right;  . CYSTOSCOPY WITH RETROGRADE PYELOGRAM, URETEROSCOPY AND STENT PLACEMENT Left 07/17/2013   Procedure: CYSTOSCOPY, LEFT URETEROSCOPY, BASKET EXTRACTION OF STONE, INSERTION OF LEFT URETERAL STENT, ;  Surgeon: Jorja Loa, MD;  Location[facility];  Service: Urology;  Laterality: Left;  . CYSTOSCOPY/RETROGRADE/URETEROSCOPY Right 09/12/2012   Procedure: CYSTOSCOPY/RETROGRADE/URETEROSCOPY;  Surgeon: Franchot Gallo, MD;  Location[facility];  Service: Urology;  Laterality: Right;  . HOLMIUM LASER APPLICATION Right 10/18/2991   Procedure: HOLMIUM LASER APPLICATION;  Surgeon: Franchot Gallo, MD;  Location[facility];  Service: Urology;  Laterality: Right;  . HOLMIUM LASER APPLICATION Left 09/14/9676   Procedure: HOLMIUM LASER APPLICATION;  Surgeon: Jorja Loa, MD;  Location[facility];  Service: Urology;  Laterality: Left;  . KIDNEY STONE SURGERY     "probably 3 times"  . LEFT HEART CATHETERIZATION WITH CORONARY ANGIOGRAM N/A 04/28/2011   Procedure: LEFT HEART CATHETERIZATION WITH CORONARY ANGIOGRAM;  Surgeon: Jacolyn Reedy, MD;  Location: [facility];  Service: Cardiovascular;  Laterality: N/A;  . LITHOTRIPSY     "many; probably 5 times"  . RADIAL ARTERY HARVEST  04/30/2011   Procedure: RADIAL ARTERY HARVEST;  Surgeon: Melrose Nakayama, MD;  Location: [place];  Service: Open Heart Surgery;  Laterality: Left;  . RETINAL DETACHMENT SURGERY  1990's   right eye; "made me have an early cataract"  . TEE WITHOUT CARDIOVERSION N/A 06/17/2020   Procedure: TRANSESOPHAGEAL ECHOCARDIOGRAM (TEE);  Surgeon: Lelon Perla, MD;  Location: [facility];  Service: Cardiovascular;  Laterality: N/A;  . VASECTOMY      Family History   Problem Relation Age of Onset  . Cataracts Father   . Cataracts Mother   . Diabetes Paternal Aunt   . Diabetes Paternal Grandmother   . Amblyopia Neg Hx   . Blindness Neg Hx   . Glaucoma Neg Hx   . Macular degeneration Neg Hx   . Retinal detachment Neg Hx   . Strabismus Neg Hx   . Retinitis pigmentosa Neg Hx     Social History: Married.  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  Reports that he has never smoked. He has never used smokeless tobacco. He reports current alcohol use. He reports current drug use. Drug: Marijuana.   Allergies  Allergen Reactions  . Gabapentin Other (See Comments)    "made me so dizzy I missed work for 2 days"  . Metformin And Related Shortness Of Breath  . Sulfa Antibiotics Shortness Of Breath and Rash  . Azithromycin Other (See Comments)    not good for heart condition  . Ciprofloxacin Other (See Comments)    tendon rupture  . Lisinopril Cough  . Losartan Potassium Other (See Comments)  hypotension  . Olmesartan Cough  . Simvastatin Cough  . Amoxicillin Rash and Other (See Comments)    rash Did it involve swelling of the face/tongue/throat, SOB, or low BP? N Did it involve sudden or severe rash/hives, skin peeling, or any reaction on the inside of your mouth or nose?  Y Did you need to seek medical attention at a hospital or doctor's office? N When did it last happen?"years and years ago" If all above answers are "NO", may proceed with cephalosporin use.   Marland Kitchen Doxycycline Rash  . Penicillins Rash    Medications Prior to Admission  Medication Sig Dispense Refill  . acetaminophen (TYLENOL) 325 MG tablet Take 325-650 mg by mouth every 6 (six) hours as needed for mild pain, fever or headache.    . allopurinol (ZYLOPRIM) 300 MG tablet Take 300 mg by mouth daily.    Marland Kitchen amLODipine (NORVASC) 5 MG tablet TAKE 1 TABLET(5 MG) BY MOUTH DAILY (Patient taking differently: Take 5 mg by mouth daily.) 90 tablet 3  .  atorvastatin (LIPITOR) 40 MG tablet Take 40 mg by mouth daily.    . Biotin 5000 MCG TABS Take 5,000 mcg by mouth daily.     . bisoprolol (ZEBETA) 5 MG tablet Take 5 mg by mouth daily.    . clopidogrel (PLAVIX) 75 MG tablet Take 1 tablet (75 mg total) by mouth daily. 30 tablet 0  . fish oil-omega-3 fatty acids 1000 MG capsule Take 1 g by mouth 2 (two) times daily.    Marland Kitchen glucosamine-chondroitin 500-400 MG tablet Take 2 tablets by mouth daily.    Marland Kitchen ibuprofen (ADVIL) 200 MG tablet Take 200 mg by mouth every 6 (six) hours as needed for fever, headache or mild pain.    Marland Kitchen insulin aspart (NOVOLOG FLEXPEN) 100 UNIT/ML FlexPen Inject 10-15 Units into the skin as needed for high blood sugar.    . Insulin Glargine (BASAGLAR KWIKPEN) 100 UNIT/ML SOPN Inject 100 Units into the skin in the morning. In AM    . irbesartan (AVAPRO) 300 MG tablet Take 1 tablet (300 mg total) by mouth daily. 90 tablet 0  . magnesium oxide (MAG-OX) 400 MG tablet Take 400 mg by mouth 2 (two) times daily.     . Multiple Vitamins-Minerals (MULTIVITAMIN WITH MINERALS) tablet Take 1 tablet by mouth daily.    . nitroGLYCERIN (NITROSTAT) 0.4 MG SL tablet Place 0.4 mg under the tongue as needed for chest pain.    Marland Kitchen oxymetazoline (AFRIN) 0.05 % nasal spray Place 1 spray into both nostrils at bedtime as needed for congestion.    . traMADol (ULTRAM) 50 MG tablet Take 50 mg by mouth daily.    Marland Kitchen ACCU-CHEK GUIDE test strip USE TO TEST BLOOD SUGAR 2 TO 3 TIMES A DAY  5  . Alirocumab (PRALUENT) 150 MG/ML SOAJ Inject 150 mg into the skin every 14 (fourteen) days. (Patient not taking: Reported on 07/22/2020) 2 mL 11  . B-D ULTRAFINE III SHORT PEN 31G X 8 MM MISC USE UTD WITH FLEXPEN.  3    Drug Regimen Review  Drug regimen was reviewed and remains appropriate with no significant issues identified  Home: Home Living Family/patient expects to be discharged to:: Private residence Living Arrangements: Spouse/significant other Available Help at  Discharge: Family,Available 24 hours/day Type of Home: House Home Access: Stairs to enter [employer] of Steps: 5 Entrance Stairs-Rails: None Home Layout: Two level Alternate Level Stairs-Number of Steps: 12 Alternate Level Stairs-Rails: Left Bathroom Shower/Tub: Tub/shower unit  Bathroom Toilet: Programmer, systems: Yes Home Equipment: None   Functional History: Prior Function Level of Independence: Independent Comments: 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 STS x2. Ambulation/Gait Ambulation/Gait assistance: Min assist,Mod assist Gait Distance (Feet): 80 Feet (+50) Assistive device: Quad cane,1 person hand held assist Gait Pattern/deviations: Step-to pattern,Decreased step length - right,Trunk flexed,Narrow base of support General Gait Details: Min assist to steady, posterior knee facilitation to prevent frequent genu recurvatum, correct LOB especially when challenged (side-stepping, directional changes). First gait attempt with chair rail in hallway and HHA, converted to quad cane only. VC for sequencing with cane, step-to gait. Gait velocity: decr Gait velocity interpretation: <1.8 ft/sec, indicate of risk for recurrent falls    ADL: ADL Overall ADL's : Needs assistance/impaired Eating/Feeding: Set up Eating/Feeding Details (indicate cue type and reason): dropping utensils with R hand; May benefit form red tubing Grooming: Set up,Supervision/safety,Sitting Upper Body Bathing: Set up,Sitting Lower Body Bathing: Sit to/from stand,Minimal assistance Upper Body  Dressing : Set up,Supervision/safety,Sitting Lower Body Dressing: Minimal assistance,Sit to/from stand Toilet Transfer: Minimal assistance,Ambulation Toileting- Clothing Manipulation and Hygiene: Min guard,Sit to/from stand Functional mobility during ADLs: Minimal assistance (unsteady; posteiror LOB)  Cognition: Cognition Overall Cognitive Status: Impaired/Different from baseline Orientation Level: Oriented X4 Cognition Arousal/Alertness: Awake/alert Behavior During Therapy: WFL for tasks assessed/performed Overall Cognitive Status: Impaired/Different from baseline Area of Impairment: Safety/judgement,Awareness,Attention Current Attention Level: Selective Safety/Judgement: Decreased awareness of deficits,Decreased awareness of safety Awareness: Emergent General Comments: Follows cues well, however requires max repeated cuing for form and safety   Blood pressure (!) 147/70, pulse (!) 51, temperature 97.9 F (36.6 C), temperature source Oral, resp. rate 20, height 6' (1.829 m), weight 108.9 kg, SpO2 97 %. Physical Exam Vitals and nursing note reviewed. Exam conducted with a chaperone present.  Constitutional:      Comments: vitiliginous changes along hairline.  Sitting up EOB- able to get there on own; wife at bedside; a little dazed- just woke up, NAD  HENT:     Head: Normocephalic.     Comments: R facial droop; tongue midline    Right Ear: External ear normal.     Left Ear: External ear normal.     Nose: Nose normal. No congestion.     Mouth/Throat:     Mouth: Mucous membranes are moist.     Pharynx: Oropharynx is clear. No oropharyngeal exudate.  Eyes:     General:        Right eye: No discharge.        Left eye: No discharge.     Comments: EOMs ok on R- full ROM of R eye, but L cannot get fully L- dysconjugate gaze L>R lid lag- better per wife  Cardiovascular:     Rate and Rhythm: Normal rate and regular rhythm.     Heart sounds: Normal heart sounds. No murmur  heard. No gallop.   Pulmonary:     Comments: CTA B/L- no W/R/R- good air movement Abdominal:     General: There is no distension.     Comments: Soft, NT, ND, (+)BS    Musculoskeletal:     Cervical back: Normal range of motion.  Comments: 2+ right pedal edema.  RUE- biceps 4-/5, triceps 4+/5, grip 4/5, FA 2-/5 LUE 5/5 in same muscles RLE- HF 3-/5, KE 3-/5, KF 3/5, DF 0-1/5; PF 2-/5  Skin:    General: Skin is warm and dry.     Comments: IV R wrist- is OK- no skin breakdown seen  Neurological:     Mental Status: He is alert and oriented to person, place, and time.     Comments: Right facial droop without dysarthria. Speech slow and measured. Left ptosis with diplopia to left field and split vision in left lower field. Right hemiplegia with decrease in fine motor coordination.  Facial sensation intact Light touch intact x 4 extremities Apraxic with RUE Word finding issues mild to moderate  Psychiatric:        Mood and Affect: Mood normal.        Behavior: Behavior normal.     Results for orders placed or performed during the hospital encounter of 07/21/20 (from the past 48 hour(s))  Glucose, capillary     Status: Abnormal   Collection Time: 07/23/20 12:01 PM  Result Value Ref Range   Glucose-Capillary 160 (H) 70 - 99 mg/dL    Comment: Glucose reference range applies only to samples taken after fasting for at least 8 hours.  Glucose, capillary     Status: Abnormal   Collection Time: 07/23/20  3:50 PM  Result Value Ref Range   Glucose-Capillary 174 (H) 70 - 99 mg/dL    Comment: Glucose reference range applies only to samples taken after fasting for at least 8 hours.  Glucose, capillary     Status: Abnormal   Collection Time: 07/23/20  9:11 PM  Result Value Ref Range   Glucose-Capillary 144 (H) 70 - 99 mg/dL    Comment: Glucose reference range applies only to samples taken after fasting for at least 8 hours.  Magnesium     Status: None   Collection Time: 07/24/20  3:13 AM   Result Value Ref Range   Magnesium 1.8 1.7 - 2.4 mg/dL    Comment: Performed at [facility] [address], [place], [place] [address]  Phosphorus     Status: None   Collection Time: 07/24/20  3:13 AM  Result Value Ref Range   Phosphorus 3.7 2.5 - 4.6 mg/dL    Comment: Performed at [facility] [address], [place], [place] [address]  CBC     Status: None   Collection Time: 07/24/20  3:13 AM  Result Value Ref Range   WBC 7.7 4.0 - 10.5 K/uL   RBC 4.26 4.22 - 5.81 MIL/uL   Hemoglobin 13.4 13.0 - 17.0 g/dL   HCT 39.2 39.0 - 52.0 %   MCV 92.0 80.0 - 100.0 fL   MCH 31.5 26.0 - 34.0 pg   MCHC 34.2 30.0 - 36.0 g/dL   RDW 13.2 11.5 - 15.5 %   Platelets 206 150 - 400 K/uL   nRBC 0.0 0.0 - 0.2 %    Comment: Performed at [facility], [place] [address] [place], [place] [address]  Basic metabolic panel     Status: Abnormal   Collection Time: 07/24/20  3:13 AM  Result Value Ref Range   Sodium 140 135 - 145 mmol/L   Potassium 4.3 3.5 - 5.1 mmol/L   Chloride 109 98 - 111 mmol/L   CO2 26 22 - 32 mmol/L   Glucose, Bld 104 (H) 70 - 99 mg/dL  Comment: Glucose reference range applies only to samples taken after fasting for at least 8 hours.   BUN 28 (H) 8 - 23 mg/dL   Creatinine, Ser 1.81 (H) 0.61 - 1.24 mg/dL   Calcium 8.8 (L) 8.9 - 10.3 mg/dL   GFR, Estimated 40 (L) >60 mL/min    Comment: (NOTE) Calculated using the CKD-EPI Creatinine Equation (2021)    Anion gap 5 5 - 15    Comment: Performed at [facility] [address], [place], [place] [address]  Glucose, capillary     Status: None   Collection Time: 07/24/20  6:38 AM  Result Value Ref Range   Glucose-Capillary 91 70 - 99 mg/dL    Comment: Glucose reference range applies only to samples taken after fasting for at least 8 hours.  Glucose, capillary     Status: Abnormal   Collection Time: 07/24/20 12:40 PM  Result Value Ref Range   Glucose-Capillary 106 (H) 70 - 99 mg/dL    Comment: Glucose  reference range applies only to samples taken after fasting for at least 8 hours.  Glucose, capillary     Status: Abnormal   Collection Time: 07/24/20  4:50 PM  Result Value Ref Range   Glucose-Capillary 185 (H) 70 - 99 mg/dL    Comment: Glucose reference range applies only to samples taken after fasting for at least 8 hours.  Glucose, capillary     Status: Abnormal   Collection Time: 07/24/20  9:16 PM  Result Value Ref Range   Glucose-Capillary 138 (H) 70 - 99 mg/dL    Comment: Glucose reference range applies only to samples taken after fasting for at least 8 hours.  Basic metabolic panel     Status: Abnormal   Collection Time: 07/25/20  3:43 AM  Result Value Ref Range   Sodium 141 135 - 145 mmol/L   Potassium 4.0 3.5 - 5.1 mmol/L   Chloride 109 98 - 111 mmol/L   CO2 26 22 - 32 mmol/L   Glucose, Bld 89 70 - 99 mg/dL    Comment: Glucose reference range applies only to samples taken after fasting for at least 8 hours.   BUN 33 (H) 8 - 23 mg/dL   Creatinine, Ser 2.02 (H) 0.61 - 1.24 mg/dL   Calcium 8.9 8.9 - 10.3 mg/dL   GFR, Estimated 35 (L) >60 mL/min    Comment: (NOTE) Calculated using the CKD-EPI Creatinine Equation (2021)    Anion gap 6 5 - 15    Comment: Performed at [facility] [address] [place], [place] [address]  Glucose, capillary     Status: Abnormal   Collection Time: 07/25/20  6:33 AM  Result Value Ref Range   Glucose-Capillary 69 (L) 70 - 99 mg/dL    Comment: Glucose reference range applies only to samples taken after fasting for at least 8 hours.  Glucose, capillary     Status: None   Collection Time: 07/25/20  7:03 AM  Result Value Ref Range   Glucose-Capillary 77 70 - 99 mg/dL    Comment: Glucose reference range applies only to samples taken after fasting for at least 8 hours.   VAS US CAROTID  Result Date: 07/24/2020 Carotid Arterial Duplex Study Patient Name:  Mitchell Lambert  Date of Exam:   07/24/2020 Medical Rec #: [id number]        Accession  #:    [id number] Date of Birth: [birth year]        Patient  Gender: M Patient Age:   87Y Exam Location:  [facility] Procedure:      VAS US CAROTID Referring Phys: [id number] [facility] [place] --------------------------------------------------------------------------------  Indications:      CVA. Risk Factors:     Hypertension, hyperlipidemia, Diabetes, coronary artery                   disease. Comparison Study: no prior Performing Technologist: Darlin Coco RDMS,RVT  Examination Guidelines: A complete evaluation includes B-mode imaging, spectral Doppler, color Doppler, and power Doppler as needed of all accessible portions of each vessel. Bilateral testing is considered an integral part of a complete examination. Limited examinations for reoccurring indications may be performed as noted.  Right Carotid Findings: +----------+--------+--------+--------+------------------+--------+           PSV cm/sEDV cm/sStenosisPlaque DescriptionComments +----------+--------+--------+--------+------------------+--------+ CCA Prox  107     13              heterogenous               +----------+--------+--------+--------+------------------+--------+ CCA Distal118     23              heterogenous               +----------+--------+--------+--------+------------------+--------+ ICA Prox  92      23      1-39%   heterogenous               +----------+--------+--------+--------+------------------+--------+ ICA Distal71      24                                         +----------+--------+--------+--------+------------------+--------+ ECA       114     15                                         +----------+--------+--------+--------+------------------+--------+ +----------+--------+-------+--------+-------------------+           PSV cm/sEDV cmsDescribeArm Pressure (mmHG) +----------+--------+-------+--------+-------------------+ Subclavian127                                         +----------+--------+-------+--------+-------------------+ +---------+--------+--+--------+--+---------+ VertebralPSV cm/s66EDV cm/s17Antegrade +---------+--------+--+--------+--+---------+  Left Carotid Findings: +----------+--------+--------+--------+------------------+--------+           PSV cm/sEDV cm/sStenosisPlaque DescriptionComments +----------+--------+--------+--------+------------------+--------+ CCA Prox  124     28              heterogenous               +----------+--------+--------+--------+------------------+--------+ CCA Distal86      23              heterogenous               +----------+--------+--------+--------+------------------+--------+ ICA Prox  83      29      1-39%   heterogenous               +----------+--------+--------+--------+------------------+--------+ ICA Distal73      16                                         +----------+--------+--------+--------+------------------+--------+ ECA  107     17                                         +----------+--------+--------+--------+------------------+--------+ +----------+--------+--------+--------+-------------------+           PSV cm/sEDV cm/sDescribeArm Pressure (mmHG) +----------+--------+--------+--------+-------------------+ Subclavian127                                         +----------+--------+--------+--------+-------------------+ +---------+--------+--+--------+--+---------+ VertebralPSV cm/s48EDV cm/s11Antegrade +---------+--------+--+--------+--+---------+   Summary: Right Carotid: Velocities in the right ICA are consistent with a 1-39% stenosis. Left Carotid: Velocities in the left ICA are consistent with a 1-39% stenosis. Vertebrals: Bilateral vertebral arteries demonstrate antegrade flow. *See table(s) above for measurements and observations.     Preliminary    VAS [place] TRANSCRANIAL DOPPLER  Result Date: 07/24/2020  Transcranial Doppler Patient Name:   Mitchell Lambert  Date of Exam:   07/24/2020 Medical Rec #: [id number]        Accession #:    [id number] Date of Birth: Aug 24, 1950        Patient Gender: M Patient Age:   69Y Exam Location:  [facility] Procedure:      VAS [place] TRANSCRANIAL DOPPLER Referring Phys: [id number] [facility] [place] --------------------------------------------------------------------------------  Indications: Stroke. Limitations for diagnostic windows: Unable to insonate left transtemporal window. Comparison Study: no prior Performing Technologist: Abram Sander RVS  Examination Guidelines: A complete evaluation includes B-mode imaging, spectral Doppler, color Doppler, and power Doppler as needed of all accessible portions of each vessel. Bilateral testing is considered an integral part of a complete examination. Limited examinations for reoccurring indications may be performed as noted.  +----------+-------------+----------+-----------+-------+ RIGHT TCD Right VM (cm)Depth (cm)PulsatilityComment +----------+-------------+----------+-----------+-------+ MCA           23.00                 2.01            +----------+-------------+----------+-----------+-------+ PCA           41.00                 1.14            +----------+-------------+----------+-----------+-------+ Opthalmic     25.00                 1.38            +----------+-------------+----------+-----------+-------+ ICA siphon    34.00                 1.58            +----------+-------------+----------+-----------+-------+ Vertebral    -22.00                 0.84            +----------+-------------+----------+-----------+-------+  +----------+------------+----------+-----------+-------+ LEFT TCD  Left VM (cm)Depth (cm)PulsatilityComment +----------+------------+----------+-----------+-------+ Opthalmic    25.00                 1.43            +----------+------------+----------+-----------+-------+ ICA siphon   30.00                  1.51            +----------+------------+----------+-----------+-------+ Vertebral    -17.00  1.06            +----------+------------+----------+-----------+-------+  +------------+-------+-------+             VM cm/sComment +------------+-------+-------+ Prox Basilar-20.00         +------------+-------+-------+ Dist Basilar-21.00         +------------+-------+-------+    Preliminary        Medical Problem List and Plan: 1.  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  Antithrombotics: -DVT/anticoagulation:  Pharmaceutical: Lovenox  -antiplatelet therapy: On ASA/Brilinta x4 weeks followed by ASA alone. 3. Pain Management: Tramadol as needed 4. Mood: LCSW to follow for evaluation and support  -antipsychotic agents: N/AA 5. Neuropsych: This patient is capable of making decisions on his own behalf. 6. Skin/Wound Care: Routine pressure-relief measures 7. Fluids/Electrolytes/Nutrition: Monitor I's/O.  Check electrolytes in a.m. 8.  HTN: Monitor blood pressures twice daily.  Avoid hypotension to avoid hypoperfusion.  -- Amlodipine and irbesartan have been on hold.  9.  T2DM: Hgb A1C- 8.8. Poorly controlled.   --Monitor blood sugars ac/hs and use SSI for elevated blood sugars.  -- Continue insulin glargine 100 units as per home regimen.   10.  CKD IIIb: Baseline BUN/SCR 31/1.77-->33/2.02. Encourage fluid intake.   --Patient educated on avoiding NSAIDs.   -- Recheck labs in am and monitor renal status with serial checks. 11.  History of gout: Continue allopurinol. Monitor for flares.  12.  CAD s/p CABG: Monitor for symptoms with increased activity. Continue ASA, fish oil and Lipitor.  -- Off bisoprolol due to bradycardia. 13. Low protein stores: Will add prosource and Ensure Max for supplement.  14. Chronic hypomagnesemia: Continue  supplement.   15. R foot drop- order PRAFO for RLE- might also help muscle cramping in R foot    Bary Leriche, PA-C 07/25/2020   I have personally performed a face to face diagnostic evaluation of this patient and formulated the key components of the plan.  Additionally, I have personally reviewed laboratory data, imaging studies, as well as relevant notes and concur with the physician assistant's documentation above.   The patient's status has not changed from the original H&P.  Any changes in documentation from the acute care chart have been noted above.

## 2020-07-25 NOTE — Discharge Summary (Signed)
Physician Discharge Summary  JERMY COUPER YBO:[id number] DOB: 01-Jan-1951 DOA: 07/21/2020  PCP: Lawerance Cruel, MD  Admit date: 07/21/2020 Discharge date: 07/25/2020  Time spent: 45 minutes  Recommendations for Outpatient Follow-up:  Patient will be discharged to inpatient rehab..  Patient will need to follow up with primary care provider within one week of discharge.  Patient will need to follow-up with neurology in 4 to 6 weeks.  Patient should continue medications as prescribed.  Patient should follow a heart healthy/carb modified diet.   Discharge Diagnoses:  Acute CVA Sinus bradycardia Essential hypertension Diabetes mellitus, type II Hyperlipidemia CKD, stage IIIb Deconditioning and debility  Discharge Condition: Stable  Diet recommendation: Heart healthy/carb modified  Filed Weights   07/21/20 2037  Weight: 108.9 kg    History of present illness:  on 07/22/2020 by Dr. 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  Patient came back from trip to [place] last week, yesterday morning, patient woke up with double vision blurred patient described that"parial overlapping visions, with a object image on the left eye lower about 1/3 height as compared to right".Meantime, he started to have feeling of dizziness and nausea but no vomiting, wife also noticed a left-sided facial droop and eyelid droop and called 911. 79-month ago, patient had 1 episode of right-sided vision loss and aphasia and came to [facility] long hospital. CT showed possible ill-defined hypoattenuation of the left parietal white matter. CTA showed severe stenosis of proximal right P2 segment. MRI negative for stroke. Patient was discharged with Plavix, with  thediagnosis of TIA.  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 A new lens was implanted on the right eye but the image was slight larger than the left sided. But over time, "brain correct the mismatch",and he never had problems since.  Hospital Course:  CVA -Patient with history of TIA -MRI brain showed small acute infarcts of the left pontine medullary junction.  Small remote left parietal infarct and chronic lacunar infarcts at the left thalamus -Neurology consulted and appreciated, pending -Continue statin, Plavix -Hemoglobin A1c 8.5, LDL 88 -last echocardiogram 06/17/2020 showed EF 55-60%, no RWMA. -Carotid Doppler showed bilateral 1 to 39% ICA a stenosis, bilateral vertebral arteries demonstrate antegrade flow -PT and OT recommending inpatient rehab.  Inpatient rehab consulted -Discussed with Dr. Leonie Man, neurology, this could be evolution or worsening of patient's previous stroke from 07/17/2020.  Recommended aspirin and Brilinta for 4 weeks followed by aspirin alone.  Also recommended sleep study, however results are still pending.  Interrogate loop recorder.  Sinus bradycardia -Currently asymptomatic and chronic.  Currently off of beta-blocker -Continue to monitor  Essential hypertension -Given acute CVA, allowing permissive hypertension -restart norvasc, discontinued beta blocker due to bradycardia  Diabetes mellitus, type II -Hemoglobin A1c 8.8 -Continue insulin sliding scale and CBG monitoring  Hyperlipidemia -Continue Repatha, statin  CKD, stage IIIb -Stable  Deconditioning and debility -Likely secondary to acute CVA -PT and OT recommending CIR  Consultants Neurology Inpatient Rehab  Procedures  Carotid and Transcranial ultrasound  Discharge Exam: Vitals:   07/25/20 0400 07/25/20 0718  BP: 139/68 (!)  147/70  Pulse: (!) 54 (!) 51  Resp: 18 20  Temp: 97.9 F (36.6 C) 97.9 F (36.6 C)  SpO2: 95% 97%     General: Well developed, well nourished, NAD, appears stated age  HEENT: NCAT, P mucous membranes moist.  Cardiovascular: S1 S2 auscultated, RRR  Respiratory: Clear to auscultation bilaterally with equal chest rise  Abdomen: Soft, nontender, nondistended, + bowel sounds  Extremities: warm dry without cyanosis clubbing or edema  Neuro: AAOx3, right-sided weakness otherwise nonfocal  Psych: Normal affect and demeanor with intact judgement and insight  Discharge Instructions Discharge Instructions    Ambulatory referral to Physical Therapy   Complete by: As directed    Increase activity slowly   Complete by: As directed      Allergies as of 07/25/2020      Reactions   Gabapentin Other (See Comments)   "made me so dizzy I missed work for 2 days"   Metformin And Related Shortness Of Breath   Sulfa Antibiotics Shortness Of Breath, Rash   Azithromycin Other (See Comments)   not good for heart condition   Ciprofloxacin Other (See Comments)   tendon rupture   Lisinopril Cough   Losartan Potassium Other (See Comments)   hypotension   Olmesartan Cough   Simvastatin Cough   Amoxicillin Rash, Other (See Comments)   rash Did it involve swelling of the face/tongue/throat, SOB, or low BP? N Did it involve sudden or severe rash/hives, skin peeling, or any reaction on the inside of your mouth or nose?  Y Did you need to seek medical attention at a hospital or doctor's office? N When did it last happen?"years and years ago" If all above answers are "NO", may proceed with cephalosporin use.   Doxycycline Rash   Penicillins Rash      Medication List    STOP taking these medications   bisoprolol 5 MG tablet Commonly known as: ZEBETA   clopidogrel 75 MG tablet Commonly known as: PLAVIX     TAKE these medications   Accu-Chek Guide test strip Generic drug: glucose  blood USE TO TEST BLOOD SUGAR 2 TO 3 TIMES A DAY   acetaminophen 325 MG tablet Commonly known as: TYLENOL Take 325-650 mg by mouth every 6 (six) hours as needed for mild pain, fever or headache.   allopurinol 300 MG tablet Commonly known as: ZYLOPRIM Take 300 mg by mouth daily.   amLODipine 5 MG tablet Commonly known as: NORVASC TAKE 1 TABLET(5 MG) BY MOUTH DAILY What changed: See the new instructions.   aspirin 81 MG EC tablet Take 1 tablet (81 mg total) by mouth daily. Swallow whole. Start taking on: Jul 26, 2020   atorvastatin 40 MG tablet Commonly known as: LIPITOR Take 40 mg by mouth daily.   B-D ULTRAFINE III SHORT PEN 31G X 8 MM Misc Generic drug: Insulin Pen Needle USE UTD WITH FLEXPEN.   Basaglar KwikPen 100 UNIT/ML Inject 100 Units into the skin in the morning. In AM   Biotin 5000 MCG Tabs Take 5,000 mcg by mouth daily.   fish oil-omega-3 fatty acids 1000 MG capsule Take 1 g by mouth 2 (two) times daily.   glucosamine-chondroitin 500-400 MG tablet Take 2 tablets by mouth daily.   ibuprofen 200 MG tablet Commonly known as: ADVIL Take 200 mg by mouth every 6 (six) hours as needed for fever, headache or mild pain.   irbesartan 300 MG tablet Commonly known as: AVAPRO Take 1 tablet (300 mg total) by mouth daily.   magnesium oxide 400 MG tablet  Commonly known as: MAG-OX Take 400 mg by mouth 2 (two) times daily.   multivitamin with minerals tablet Take 1 tablet by mouth daily.   nitroGLYCERIN 0.4 MG SL tablet Commonly known as: NITROSTAT Place 0.4 mg under the tongue as needed for chest pain.   NovoLOG FlexPen 100 UNIT/ML FlexPen Generic drug: insulin aspart Inject 10-15 Units into the skin as needed for high blood sugar.   oxymetazoline 0.05 % nasal spray Commonly known as: AFRIN Place 1 spray into both nostrils at bedtime as needed for congestion.   Praluent 150 MG/ML Soaj Generic drug: Alirocumab Inject 150 mg into the skin every 14  (fourteen) days.   ticagrelor 90 MG Tabs tablet Commonly known as: BRILINTA Take 1 tablet (90 mg total) by mouth 2 (two) times daily.   traMADol 50 MG tablet Commonly known as: ULTRAM Take 50 mg by mouth daily.      Allergies  Allergen Reactions  . Gabapentin Other (See Comments)    "made me so dizzy I missed work for 2 days"  . Metformin And Related Shortness Of Breath  . Sulfa Antibiotics Shortness Of Breath and Rash  . Azithromycin Other (See Comments)    not good for heart condition  . Ciprofloxacin Other (See Comments)    tendon rupture  . Lisinopril Cough  . Losartan Potassium Other (See Comments)    hypotension  . Olmesartan Cough  . Simvastatin Cough  . Amoxicillin Rash and Other (See Comments)    rash Did it involve swelling of the face/tongue/throat, SOB, or low BP? N Did it involve sudden or severe rash/hives, skin peeling, or any reaction on the inside of your mouth or nose?  Y Did you need to seek medical attention at a hospital or doctor's office? N When did it last happen?"years and years ago" If all above answers are "NO", may proceed with cephalosporin use.   Marland Kitchen Doxycycline Rash  . Penicillins Rash    Follow-up Information    [facility].   Specialty: Rehabilitation Why: The outpatient therapy will contact you for the first appointment Contact information: [place]       Lawerance Cruel, MD. Schedule an appointment as soon as possible for a visit in 1 week(s).   Specialty: Family Medicine Why: Hospital follow up Contact information: [address] [place] [address] [phone]        Lorretta Harp, MD .   Specialties: Cardiology, Radiology Contact information: [address] [place] [address] [phone]        Vickie Epley, MD .   Specialties: Cardiology,  Radiology Contact information: [place] [address] [phone]        Garvin Fila, MD. Schedule an appointment as soon as possible for a visit in 4 week(s).   Specialties: Neurology, Radiology Why: Hospital follow up Contact information: [address] [place] [address] [phone]                The results of significant diagnostics from this hospitalization (including imaging, microbiology, ancillary and laboratory) are listed below for reference.    Significant Diagnostic Studies: CT HEAD WO CONTRAST  Result Date: 07/21/2020 CLINICAL DATA:  [place] EXAM: CT HEAD WITHOUT CONTRAST TECHNIQUE: Contiguous axial images were obtained from the base of the skull through the vertex without intravenous contrast. COMPARISON:  05/27/2020 FINDINGS: Brain: Cavum  septum pellucidum. Parenchymal volume loss is commensurate with the patient's age. Mild periventricular white matter changes are present likely reflecting the sequela of small vessel ischemia. Remote left parietal subcortical white matter infarct and punctate left thalamic infarct again noted. No abnormal intra or extra-axial mass lesion or fluid collection. No abnormal mass effect or midline shift. No evidence of acute intracranial hemorrhage or infarct. Ventricular size is normal. Cerebellum unremarkable. Vascular: No asymmetric hyperdense vasculature at the skull base. Skull: Intact Sinuses/Orbits: Moderate mucosal thickening involving the right maxillary sinus without air-fluid level. Remaining paranasal sinuses are clear. Orbits are unremarkable. Other: Mastoid air cells and middle ear cavities are clear. IMPRESSION: No acute intracranial hemorrhage or infarct. Stable senescent changes and remote infarcts. Unchanged moderate right maxillary sinus disease. Electronically Signed   By: Fidela Salisbury MD   On: 07/21/2020 22:15   MR BRAIN WO CONTRAST  Result Date: 07/23/2020 CLINICAL  DATA:  Stroke follow-up EXAM: MRI HEAD WITHOUT CONTRAST TECHNIQUE: Multiplanar, multiecho pulse sequences of the brain and surrounding structures were obtained without intravenous contrast. COMPARISON:  05/28/2020 FINDINGS: Brain: Restricted diffusion at the left pontomedullary junction and in the left cerebral white matter lateral to the atrium of the lateral ventricle. No acute hemorrhage, hydrocephalus, or collection. Chronic perforator infarct at the left thalamus. Small remote left parietal cortex infarct. Vascular: Preserved flow voids Skull and upper cervical spine: Normal marrow signal Sinuses/Orbits: Progressive, complete opacification of the right maxillary sinus when compared to prior. Postoperative right globe. IMPRESSION: 1. Small acute infarcts at the left pontine medullary junction and left cerebral white matter. 2. Small remote left parietal infarct. Chronic lacunar infarct at the left thalamus. 3. Active right maxillary sinusitis which has progressed from comparison 2 months ago. Electronically Signed   By: Monte Fantasia M.D.   On: 07/23/2020 08:43   CUP PACEART REMOTE DEVICE CHECK  Result Date: 07/24/2020 ILR summary report received. Battery status OK. Normal device function. No new symptom, tachy, brady, or pause episodes. No new AF episodes. Monthly summary reports and ROV/PRN Kathy Breach, RN, CCDS, [employer]  VAS US CAROTID  Result Date: 07/24/2020 Carotid Arterial Duplex Study Patient Name:  NEWMAN WAREN  Date of Exam:   07/24/2020 Medical Rec #: [id number]        Accession #:    [id number] Date of Birth: [birth year]        Patient Gender: M Patient Age:   69Y Exam Location:  [facility] Procedure:      VAS US CAROTID Referring Phys: [id number] [facility] [place] --------------------------------------------------------------------------------  Indications:      CVA. Risk Factors:     Hypertension, hyperlipidemia, Diabetes, coronary artery                   disease.  Comparison Study: no prior Performing Technologist: Darlin Coco RDMS,RVT  Examination Guidelines: A complete evaluation includes B-mode imaging, spectral Doppler, color Doppler, and power Doppler as needed of all accessible portions of each vessel. Bilateral testing is considered an integral part of a complete examination. Limited examinations for reoccurring indications may be performed as noted.  Right Carotid Findings: +----------+--------+--------+--------+------------------+--------+           PSV cm/sEDV cm/sStenosisPlaque DescriptionComments +----------+--------+--------+--------+------------------+--------+ CCA Prox  107     13              heterogenous               +----------+--------+--------+--------+------------------+--------+ CCA Distal118     23  heterogenous               +----------+--------+--------+--------+------------------+--------+ ICA Prox  92      23      1-39%   heterogenous               +----------+--------+--------+--------+------------------+--------+ ICA Distal71      24                                         +----------+--------+--------+--------+------------------+--------+ ECA       114     15                                         +----------+--------+--------+--------+------------------+--------+ +----------+--------+-------+--------+-------------------+           PSV cm/sEDV cmsDescribeArm Pressure (mmHG) +----------+--------+-------+--------+-------------------+ Subclavian127                                        +----------+--------+-------+--------+-------------------+ +---------+--------+--+--------+--+---------+ VertebralPSV cm/s66EDV cm/s17Antegrade +---------+--------+--+--------+--+---------+  Left Carotid Findings: +----------+--------+--------+--------+------------------+--------+           PSV cm/sEDV cm/sStenosisPlaque DescriptionComments  +----------+--------+--------+--------+------------------+--------+ CCA Prox  124     28              heterogenous               +----------+--------+--------+--------+------------------+--------+ CCA Distal86      23              heterogenous               +----------+--------+--------+--------+------------------+--------+ ICA Prox  83      29      1-39%   heterogenous               +----------+--------+--------+--------+------------------+--------+ ICA Distal73      16                                         +----------+--------+--------+--------+------------------+--------+ ECA       107     17                                         +----------+--------+--------+--------+------------------+--------+ +----------+--------+--------+--------+-------------------+           PSV cm/sEDV cm/sDescribeArm Pressure (mmHG) +----------+--------+--------+--------+-------------------+ Subclavian127                                         +----------+--------+--------+--------+-------------------+ +---------+--------+--+--------+--+---------+ VertebralPSV cm/s48EDV cm/s11Antegrade +---------+--------+--+--------+--+---------+   Summary: Right Carotid: Velocities in the right ICA are consistent with a 1-39% stenosis. Left Carotid: Velocities in the left ICA are consistent with a 1-39% stenosis. Vertebrals: Bilateral vertebral arteries demonstrate antegrade flow. *See table(s) above for measurements and observations.     Preliminary    VAS [place] TRANSCRANIAL DOPPLER  Result Date: 07/24/2020  Transcranial Doppler Patient Name:  ETHRIDGE SOLLENBERGER  Date of Exam:   07/24/2020 Medical Rec #: [id number]        Accession #:  [id number] Date of Birth: [birth year]        Patient Gender: M Patient Age:   69Y [facility]am [facility] Procedure:      VAS [place] TRANSCRANIAL DOPPLER Referring Phys: [id number] [facility] [place]  --------------------------------------------------------------------------------  Indications: Stroke. Limitations for diagnostic windows: Unable to insonate left transtemporal window. Comparison Study: no prior Performing Technologist: Abram Sander RVS  Examination Guidelines: A complete evaluation includes B-mode imaging, spectral Doppler, color Doppler, and power Doppler as needed of all accessible portions of each vessel. Bilateral testing is considered an integral part of a complete examination. Limited examinations for reoccurring indications may be performed as noted.  +----------+-------------+----------+-----------+-------+ RIGHT TCD Right VM (cm)Depth (cm)PulsatilityComment +----------+-------------+----------+-----------+-------+ MCA           23.00                 2.01            +----------+-------------+----------+-----------+-------+ PCA           41.00                 1.14            +----------+-------------+----------+-----------+-------+ Opthalmic     25.00                 1.38            +----------+-------------+----------+-----------+-------+ ICA siphon    34.00                 1.58            +----------+-------------+----------+-----------+-------+ Vertebral    -22.00                 0.84            +----------+-------------+----------+-----------+-------+  +----------+------------+----------+-----------+-------+ LEFT TCD  Left VM (cm)Depth (cm)PulsatilityComment +----------+------------+----------+-----------+-------+ Opthalmic    25.00                 1.43            +----------+------------+----------+-----------+-------+ ICA siphon   30.00                 1.51            +----------+------------+----------+-----------+-------+ Vertebral    -17.00                1.06            +----------+------------+----------+-----------+-------+  +------------+-------+-------+             VM cm/sComment +------------+-------+-------+  Prox Basilar-20.00         +------------+-------+-------+ Dist Basilar-21.00         +------------+-------+-------+    Preliminary     Microbiology: Recent Results (from the past 240 hour(s))  Resp Panel by RT-PCR (Flu A&B, Covid) Nasopharyngeal Swab     Status: None   Collection Time: 07/21/20  9:01 PM   Specimen: Nasopharyngeal Swab; Nasopharyngeal(NP) swabs in vial transport medium  Result Value Ref Range Status   SARS Coronavirus 2 by RT PCR NEGATIVE NEGATIVE Final    Comment: (NOTE) SARS-CoV-2 target nucleic acids are NOT DETECTED.  The SARS-CoV-2 RNA is generally detectable in upper respiratory specimens during the acute phase of infection. The lowest concentration of SARS-CoV-2 viral copies this assay can detect is 138 copies/mL. A negative result does not preclude SARS-Cov-2 infection and should not be used as the sole basis for treatment or other patient management decisions. A negative result may occur with  improper  specimen collection/handling, submission of specimen other than nasopharyngeal swab, presence of viral mutation(s) within the areas targeted by this assay, and inadequate number of viral copies(<138 copies/mL). A negative result must be combined with clinical observations, patient history, and epidemiological information. The expected result is Negative.  Fact Sheet for Patients:  EntrepreneurPulse.com.au  Fact Sheet for Healthcare Providers:  IncredibleEmployment.be  This test is no t yet approved or cleared by the [place] FDA and  has been authorized for detection and/or diagnosis of SARS-CoV-2 by FDA under an Emergency Use Authorization (EUA). This EUA will remain  in effect (meaning this test can be used) for the duration of the COVID-19 declaration under Section 564(b)(1) of the Act, 21 U.S.C.section 360bbb-3(b)(1), unless the authorization is terminated  or revoked sooner.       Influenza A by PCR  NEGATIVE NEGATIVE Final   Influenza B by PCR NEGATIVE NEGATIVE Final    Comment: (NOTE) The Xpert Xpress SARS-CoV-2/FLU/RSV plus assay is intended as an aid in the diagnosis of influenza from Nasopharyngeal swab specimens and should not be used as a sole basis for treatment. Nasal washings and aspirates are unacceptable for Xpert Xpress SARS-CoV-2/FLU/RSV testing.  Fact Sheet for Patients: EntrepreneurPulse.com.au  Fact Sheet for Healthcare Providers: IncredibleEmployment.be  This test is not yet approved or cleared by the [place] FDA and has been authorized for detection and/or diagnosis of SARS-CoV-2 by FDA under an Emergency Use Authorization (EUA). This EUA will remain in effect (meaning this test can be used) for the duration of the COVID-19 declaration under Section 564(b)(1) of the Act, 21 U.S.C. section 360bbb-3(b)(1), unless the authorization is terminated or revoked.  Performed at [facility], [address], [place], [place] [address]      Labs: Basic Metabolic Panel: Recent Labs  Lab 07/21/20 2101 07/24/20 0313 07/25/20 0343  NA 139 140 141  K 4.3 4.3 4.0  CL 109 109 109  CO2 23 26 26   GLUCOSE 148* 104* 89  BUN 26* 28* 33*  CREATININE 1.64* 1.81* 2.02*  CALCIUM 8.9 8.8* 8.9  MG  --  1.8  --   PHOS  --  3.7  --    Liver Function Tests: Recent Labs  Lab 07/21/20 2101  AST 19  ALT 18  ALKPHOS 119  BILITOT 0.7  PROT 5.9*  ALBUMIN 3.2*   No results for input(s): LIPASE, AMYLASE in the last 168 hours. No results for input(s): AMMONIA in the last 168 hours. CBC: Recent Labs  Lab 07/21/20 2101 07/24/20 0313  WBC 8.8 7.7  NEUTROABS 6.2  --   HGB 14.2 13.4  HCT 40.6 39.2  MCV 89.2 92.0  PLT 223 206   Cardiac Enzymes: No results for input(s): CKTOTAL, CKMB, CKMBINDEX, TROPONINI in the last 168 hours. BNP: BNP (last 3 results) No results for input(s): BNP in the last 8760  hours.  ProBNP (last 3 results) No results for input(s): PROBNP in the last 8760 hours.  CBG: Recent Labs  Lab 07/24/20 1240 07/24/20 1650 07/24/20 2116 07/25/20 0633 07/25/20 0703  GLUCAP 106* 185* 138* 69* 77       Signed:  Aiyanna Awtrey  [facility] 07/25/2020, 10:38 AM

## 2020-07-25 NOTE — Progress Notes (Signed)
Occupational Therapy Treatment Patient Details Name: Mitchell Lambert MRN: [id number] DOB: [birth year] Today's Date: 07/25/2020    History of present illness 70 y/o male presented to [facility] ED for concern of dizziness and diplopia. MRI showed acute L pontine CVA junction and  left cerebral white matter; remote left parietal infarct. 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 PMH: CAD s/p CABG, HTN, asthma, type 2 diabetes, CKD stage III, HLD, remote R retinal detachment, HTN, CVA.   OT comments  Pt received seated EOB agreeable to OT intervention. Session focus on visual deficits and RUE motor planning, pt with eye patch over his L eye upon arrival reports that he has been unable to read ( with and without eye patch). Pt reports that his biggest deficit is that his eyes don't focus together ( images overlap) and disconjugate gaze is worse when turning head to L side, noted R eye has difficulty crossing midline during visual tracking task. Pt completed table top visual scanning task where pt instructed to bisect all horizontal lines down the middle,pt completed task with 100% accuracy with increased time and effort as pt is R handed having increased difficulty using R hand to manipulate pen. Pt additionally completed RUE motor planning activity where pt instructed to stack/ unstack medicine cups with some sensory motor planning deficits noted when using RUE, pt also hiking up R shoulder to reach for cups rather than using elbow flexor/ extensors to retrieve items. Education provided on using RUE as much as possible to improve AROM and motor planning. Pt likely to DC to CIR today, will follow acutely per POC.   Follow Up Recommendations  CIR    Equipment Recommendations  3 in 1 bedside commode;Other (comment) (RW)    Recommendations for Other Services      Precautions / Restrictions Precautions Precautions: Fall Precaution Comments: overlapping vision and dizziness Restrictions Weight  Bearing Restrictions: No       Mobility Bed Mobility               General bed mobility comments: sitting EOB upon arrival, left EOB at end of session    Transfers                      Balance Overall balance assessment: Needs assistance Sitting-balance support: No upper extremity supported;Feet supported Sitting balance-Leahy Scale: Good                                     ADL either performed or assessed with clinical judgement   ADL Overall ADL's : Needs assistance/impaired                                       General ADL Comments: session focus on visual table top screening tasks from EOB     Vision Baseline Vision/History: Wears glasses Wears Glasses: Reading only Patient Visual Report: Blurring of vision;Diplopia     Perception Perception Comments: impaired sensory motor when using RUE, imparied FMC noted to overcompensate with R shoulder, impaired precision noted when stacking medicine cups on table   Praxis Praxis Praxis-Other Comments: decreased initiation when stacking med cups with RUE, increased time needed in comparision to using LUE    Cognition Arousal/Alertness: Awake/alert Behavior During Therapy: WFL for tasks assessed/performed Overall Cognitive Status: Within Functional Limits for  tasks assessed                                 General Comments: good awareness into deficits, appreciative of education        Exercises General Exercises - Upper Extremity Shoulder Flexion: AROM;Right;5 reps;Seated;Limitations Shoulder Flexion Limitations: limited to ~ 90* Elbow Flexion: AROM;Right;5 reps;Seated Elbow Extension: AROM;Right;5 reps;Seated Wrist Flexion: AROM;Right;5 reps;Seated Wrist Extension: AROM;Right;5 reps;Seated Digit Composite Flexion: AROM;Right;5 reps;Seated Composite Extension: AROM;Right;5 reps;Seated   Shoulder Instructions       General Comments wife present during  session    Pertinent Vitals/ Pain       Pain Assessment: No/denies pain  Home Living     Available Help at Discharge: Family;Available 24 hours/day Type of Home: House                              Lives With: Spouse    Prior Functioning/Environment              Frequency  Min 2X/week        Progress Toward Goals  OT Goals(current goals can now be found in the care plan section)  Progress towards OT goals: Progressing toward goals  Acute Rehab OT Goals Patient Stated Goal: to go to rehab OT Goal Formulation: With patient Time For Goal Achievement: 08/06/20 Potential to Achieve Goals: Good  Plan Discharge plan remains appropriate;Frequency remains appropriate    Co-evaluation                 AM-PAC OT "6 Clicks" Daily Activity     Outcome Measure   Help from another person eating meals?: A Little Help from another person taking care of personal grooming?: A Little Help from another person toileting, which includes using toliet, bedpan, or urinal?: A Little Help from another person bathing (including washing, rinsing, drying)?: A Little Help from another person to put on and taking off regular upper body clothing?: A Little Help from another person to put on and taking off regular lower body clothing?: A Little 6 Click Score: 18    End of Session    OT Visit Diagnosis: Unsteadiness on feet (R26.81);Other abnormalities of gait and mobility (R26.89);Muscle weakness (generalized) (M62.81);Low vision, both eyes (H54.2);Other symptoms and signs involving cognitive function;Dizziness and giddiness (R42)   Activity Tolerance Patient tolerated treatment well   Patient Left in bed;with call bell/phone within reach;Other (comment);with family/visitor present (sitting EOB)   Nurse Communication Mobility status        Time: 2694-8546 OT Time Calculation (min): 18 min  Charges: OT General Charges $OT Visit: 1 Visit OT Treatments $Therapeutic  Activity: 8-22 mins  Harley Alto., COTA/L Acute Rehabilitation Services [phone]    Mitchell Lambert 07/25/2020, 12:50 PM

## 2020-07-25 NOTE — Progress Notes (Signed)
Inpatient Rehabilitation Medication Review by a Pharmacist  A complete drug regimen review was completed for this patient to identify any potential clinically significant medication issues.  Clinically significant medication issues were identified:  no  Check AMION for pharmacist assigned to patient if future medication questions/issues arise during this admission.  Pharmacist comments: Home Norvasc and Avapro not resumed. Resume as BP dictates.  Time spent performing this drug regimen review (minutes):  5,om  March Steyer S. Alford Highland, PharmD, BCPS Clinical Staff Pharmacist [email or web address] [place] 07/25/2020 3:15 PM

## 2020-07-25 NOTE — PMR Pre-admission (Signed)
PMR Admission Coordinator Pre-Admission Assessment  Patient: Mitchell Lambert is an 70 y.o., male MRN: [id number] DOB: 06-13-1950 Height: 6' (182.9 cm) Weight: 108.9 kg              Insurance Information HMO:     PPO:      PCP:      IPA:      80/20:      OTHER:  PRIMARY: Medicare a and b      Policy#: [id number]      Subscriber: pt Benefits:  Phone #: passport one online     Name: 5/26 Eff. Date: a 10/01/2014 and b 03/02/2016     Deduct: $1556      Out of Pocket Max: none      Life Max: none  CIR: 100%      SNF: 20 full days Outpatient: 80%     Co-Pay: 20% Home Health: 1005      Co-Pay: none DME: 80%     Co-Pay: 205 Providers: pt choice  SECONDARY: AARP medicare supplement      Policy#: [id number]  Financial Counselor:       Phone#:   The "Data Collection Information Summary" for patients in Inpatient Rehabilitation Facilities with attached "Privacy Act [place] Records" was provided and verbally reviewed with: Patient and Family  Emergency Contact Information Contact Information    Name Relation Home Work Mobile   Sabatt[place] Spouse [phone]  [phone]     Current Medical History  Patient Admitting Diagnosis: CVA  History of Present Illness:  70 y.o. 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   5 steps to entry.  He presented on 07/19/2020 with diplopia and dizziness. Cranial CT scan unremarkable for acute intracranial process.  Patient did not receive TPA.  MRI showed small acute infarct at the left pontine medullary junction and left cerebellar white matter.  Small remote left parietal infarct.  Chronic lacunar infarct at the left thalamus.  Admission chemistries unremarkable aside BUN 26, creatinine 1.64, urine drug screen negative, alcohol negative.  Carotid Dopplers pending.  Patient is currently maintained on Plavix  for CVA prophylaxis.  Subcutaneous Lovenox for DVT prophylaxis.  Hospital course complicated by bouts of sinus bradycardia that was chronic and currently off any beta-blocker.   Complete NIHSS TOTAL: 2    Past Medical History  Past Medical History:  Diagnosis Date  . Angina    NO ANGINA SINCE CABG  . Arthritis    right ankle  . Chronic daily headache    "@ least every other day""improved since heart surgery"  . Coronary artery disease    PT STATES HEART DOING WELL SINCE CABG SURGERY - DR. TILLEY IS HIS CARDIOLOGIST  . Diabetes mellitus    Lantus x 10 yrs  . Gout   . History of kidney stones 09-08-12   multiple times with some lithotripsies  . Hyperlipemia   . Hypertension     Family History  family history includes Cataracts in his father and mother; Diabetes in his paternal aunt and paternal grandmother.  Prior Rehab/Hospitalizations:  Has the patient had prior rehab or hospitalizations prior to admission? Yes  Has the patient had major surgery during 100 days prior to admission? No  Current Medications   Current Facility-Administered Medications:  .  acetaminophen (TYLENOL) tablet 650 mg, 650 mg, Oral, Q4H PRN **OR** acetaminophen (TYLENOL) 160 MG/5ML solution  650 mg, 650 mg, Per Tube, Q4H PRN **OR** acetaminophen (TYLENOL) suppository 650 mg, 650 mg, Rectal, Q4H PRN, Wynetta Fines T, MD .  allopurinol (ZYLOPRIM) tablet 300 mg, 300 mg, Oral, Daily, Wynetta Fines T, MD, 300 mg at 07/25/20 0933 .  aspirin EC tablet 81 mg, 81 mg, Oral, Daily, Olivencia-Simmons, Ivelisse, NP, 81 mg at 07/25/20 0933 .  atorvastatin (LIPITOR) tablet 40 mg, 40 mg, Oral, Daily, Wynetta Fines T, MD, 40 mg at 07/25/20 0932 .  enoxaparin (LOVENOX) injection 40 mg, 40 mg, Subcutaneous, Q24H, Wynetta Fines T, MD, 40 mg at 07/24/20 2109 .  hydrALAZINE (APRESOLINE) tablet 25 mg, 25 mg, Oral, Q6H PRN, Wynetta Fines T, MD .  insulin aspart (novoLOG) injection 0-15 Units, 0-15 Units, Subcutaneous, TID WC, Lequita Halt, MD, 3 Units at 07/24/20 1724 .  insulin glargine (LANTUS) injection 100 Units, 100 Units, Subcutaneous, q AM, Lequita Halt, MD, 100 Units at 07/24/20 0805 .  Ipratropium-Albuterol (COMBIVENT) respimat 1 puff, 1 puff, Inhalation, Q6H PRN, Wynetta Fines T, MD .  LORazepam (ATIVAN) tablet 0.5 mg, 0.5 mg, Oral, Daily PRN, Wynetta Fines T, MD .  magnesium oxide (MAG-OX) tablet 400 mg, 400 mg, Oral, BID, Wynetta Fines T, MD, 400 mg at 07/25/20 0933 .  melatonin tablet 3 mg, 3 mg, Oral, QHS, Mikhail, [place], DO, 3 mg at 07/24/20 2109 .  multivitamin with minerals tablet 1 tablet, 1 tablet, Oral, Daily, Lequita Halt, MD, 1 tablet at 07/25/20 [phone] .  omega-3 acid ethyl esters (LOVAZA) capsule 1 g, 1 g, Oral, BID, Wynetta Fines T, MD, 1 g at 07/25/20 0933 .  pantoprazole (PROTONIX) EC tablet 40 mg, 40 mg, Oral, Daily, Wynetta Fines T, MD, 40 mg at 07/25/20 0933 .  senna-docusate (Senokot-S) tablet 1 tablet, 1 tablet, Oral, QHS PRN, Wynetta Fines T, MD .  ticagrelor [facility]) tablet 90 mg, 90 mg, Oral, BID, Skeet Simmer, RPH, 90 mg at 07/25/20 5631 .  traMADol (ULTRAM) tablet 50 mg, 50 mg, Oral, Q6H PRN, Wynetta Fines T, MD, 50 mg at 07/25/20 0019 .  zolpidem (AMBIEN) tablet 5 mg, 5 mg, Oral, QHS PRN, Cristal Ford, DO, 5 mg at 07/24/20 2146  Patients Current Diet:  Diet Order            Diet heart healthy/carb modified Room service appropriate? 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  Independent  Indoor Mobility: Did the patient need assistance with walking from room to room (with or without device)? Independent  Stairs: Did the patient need assistance with internal or external stairs (with or without device)? Independent  Functional Cognition: Did the patient need help planning regular tasks such as shopping or remembering to take medications? Independent  Home Assistive Devices / Equipment Home Equipment: None  Prior Device Use: Indicate devices/aids used by the patient prior to current  illness, exacerbation or injury? None of the above  Current Functional Level Cognition  Overall Cognitive Status: Impaired/Different from baseline Current Attention Level: Selective Orientation Level: Oriented X4 Safety/Judgement: Decreased awareness of deficits,Decreased awareness of safety General Comments: Follows cues well, however requires max repeated cuing for form and safety    Extremity Assessment (includes Sensation/Coordination)  Upper Extremity Assessment: RUE deficits/detail RUE Deficits / Details: +drift; weaker distally; grip @ 4/5; not initiating use of RUE at times; appeasr unaware of position of hand; reports "it feels a little different". AROM overall WFL; decreased fine-motor and in0hand amnipulation skills. Pt reports dropping his utensils RUE Sensation: decreased light touch,decreased proprioception RUE Coordination: decreased fine motor  Lower Extremity Assessment: Defer to PT evaluation RLE Deficits / Details: R LE grossly 4/5 RLE Coordination: decreased fine motor    ADLs  Overall ADL's : Needs assistance/impaired Eating/Feeding: Set up Eating/Feeding Details (indicate cue type and reason): dropping utensils with R hand; May benefit form red tubing Grooming: Set up,Supervision/safety,Sitting Upper Body Bathing: Set up,Sitting Lower Body  Bathing: Sit to/from stand,Minimal assistance Upper Body Dressing : Set up,Supervision/safety,Sitting Lower Body Dressing: Minimal assistance,Sit to/from stand Toilet Transfer: Minimal assistance,Ambulation Toileting- Clothing Manipulation and Hygiene: Min guard,Sit to/from stand Functional mobility during ADLs: Minimal assistance (unsteady; posteiror LOB)    Mobility  Overal bed mobility: Modified Independent General bed mobility comments: sitting EOB upon PT arrival    Transfers  Overall transfer level: Needs assistance Equipment used: 1 person hand held assist Transfers: Sit to/from Stand Sit to Stand: Min assist General transfer comment: min assist for rise, steadying upon standing via HHA. STS x2.    Ambulation / Gait / Stairs / Wheelchair Mobility  Ambulation/Gait Ambulation/Gait assistance: Min assist,Mod assist Gait Distance (Feet): 80 Feet (+50) Assistive device: Quad cane,1 person hand held assist Gait Pattern/deviations: Step-to pattern,Decreased step length - right,Trunk flexed,Narrow base of support General Gait Details: Min assist to steady, posterior knee facilitation to prevent frequent genu recurvatum, correct LOB especially when challenged (side-stepping, directional changes). First gait attempt with chair rail in hallway and HHA, converted to quad cane only. VC for sequencing with cane, step-to gait. Gait velocity: decr Gait velocity interpretation: <1.8 ft/sec, indicate of risk for recurrent falls    Posture / Balance Balance Overall balance assessment: Needs assistance Sitting-balance support: No upper extremity supported,Feet supported Sitting balance-Leahy Scale: Good Standing balance support: No upper extremity supported,During functional activity Standing balance-Leahy Scale: Poor Standing balance comment: requires external support    Special needs/care consideration Hgb A1c 8.5 Loop recorder implanted pta Sleep Smart study participant    Previous  Home Environment  Living Arrangements: Spouse/significant other  Lives With: Spouse Available Help at Discharge: Family,Available 24 hours/day Type of Home: House Home Layout: Two level Alternate Level Stairs-Rails: Left Alternate Level Stairs-Number of Steps: 12 Home Access: Stairs to enter Entrance Stairs-Rails: None Entrance Stairs-Number of Steps: 5 Bathroom Shower/Tub: Chiropodist: Standard Bathroom Accessibility: Yes How Accessible: Accessible via walker Home Care Services: No Additional Comments: They are selling this home and moving into their townhome  Discharge Living Setting Plans for Discharge Living Setting: Patient's home,Lives with (comment),Other (Comment) (townhome with wife) Type of Home at Discharge: Other (Comment) (townhome) Discharge Home Layout: Two level,1/2 bath on main level,Bed/bath upstairs Alternate Level Stairs-Rails: Left Alternate Level Stairs-Number of Steps: 12 Discharge Home Access: Level entry (5 steps down from parking lot to sidewalk and into level entry) Discharge Bathroom Shower/Tub: Walk-in shower Discharge Bathroom Toilet: Standard Discharge Bathroom Accessibility: Yes  How Accessible: Accessible via walker Does the patient have any problems obtaining your medications?: No  Social/Family/Support Systems Patient Roles: Spouse (employee) Contact Information: wife, [name] Anticipated Caregiver: wife Anticipated Ambulance person Information: see above Ability/Limitations of Caregiver: wife is a Educational psychologist Availability: Intermittent Discharge Plan Discussed with Primary Caregiver: Yes Is Caregiver In Agreement with Plan?: Yes Does Caregiver/Family have Issues with Lodging/Transportation while Pt is in Rehab?: No  Goals Patient/Family Goal for Rehab: Mod I with PT and OT Expected length of stay: ELOS 7 to 12 days Additional Information: Patient and wife are placing their home on market to sale and are  moving to their townhome at his discharge Pt/Family Agrees to Admission and willing to participate: Yes Program Orientation Provided & Reviewed with Pt/Caregiver Including Roles  & Responsibilities: Yes  Decrease burden of Care through IP rehab admission: n/a  Possible need for SNF placement upon discharge:not anticipated  Patient Condition: This patient's condition remains as documented in the consult dated 07/24/2020, in which the Rehabilitation Physician determined and documented that the patient's condition is appropriate for intensive rehabilitative care in an inpatient rehabilitation facility. Will admit to inpatient rehab today.  Preadmission Screen Completed By:  Cleatrice Burke, RN, 07/25/2020 10:35 AM ______________________________________________________________________   Discussed status with Dr. Dagoberto Ligas on 07/25/2020 at  1040 and received approval for admission today.  Admission Coordinator:  Cleatrice Burke, time 0131 Date 07/25/2020

## 2020-07-25 NOTE — TOC Transition Note (Signed)
Transition of Care [facility]) - CM/SW Discharge Note   Patient Details  Name: AKSEL BENCOMO MRN: [id number] Date of Birth: [birth year]  Transition of Care [facility]) CM/SW Contact:  Pollie Friar, RN Phone Number: 07/25/2020, 10:44 AM   Clinical Narrative:    Patient discharging to CIR today. CM signing off.   Final next level of care: IP Rehab Facility Barriers to Discharge: No Barriers Identified   Patient Goals and CMS Choice   CMS Medicare.gov Compare Post Acute Care list provided to:: Patient Choice offered to / list presented to : Patient  Discharge Placement                       Discharge Plan and Services   Discharge Planning Services: CM Consult Post Acute Care Choice: IP Rehab                               Social Determinants of Health (SDOH) Interventions     Readmission Risk Interventions No flowsheet data found.

## 2020-07-25 NOTE — Progress Notes (Signed)
Cristina Gong, RN  Rehab Admission Coordinator  Physical Medicine and Rehabilitation  PMR Pre-admission     Signed  Date of Service:  07/25/2020 10:35 AM      Related encounter: ED to Hosp-Admission (Discharged) from 07/21/2020 in [facility]       Signed          Show:Clear all [x] Manual[x] Template[x] Copied  Added by: [x] Cristina Gong, RN   [] Hover for details  PMR Admission Coordinator Pre-Admission Assessment   Patient: Mitchell Lambert is an 70 y.o., male MRN: [id number] DOB: Sep 03, 1950 Height: 6' (182.9 cm) Weight: 108.9 kg                                                                                                                                                  Insurance Information HMO:     PPO:      PCP:      IPA:      80/20:      OTHER:  PRIMARY: Medicare a and b      Policy#: [id number]      Subscriber: pt Benefits:  Phone #: passport one online     Name: 5/26 Eff. Date: a 10/01/2014 and b 03/02/2016     Deduct: $1556      Out of Pocket Max: none      Life Max: none  CIR: 100%      SNF: 20 full days Outpatient: 80%     Co-Pay: 20% Home Health: 1005      Co-Pay: none DME: 80%     Co-Pay: 205 Providers: pt choice  SECONDARY: AARP medicare supplement      Policy#: [id number]   Financial Counselor:       Phone#:    The "Data Collection Information Summary" for patients in Inpatient Rehabilitation Facilities with attached "Privacy Act [place] Records" was provided and verbally reviewed with: Patient and Family   Emergency Contact Information         Contact Information     Name Relation Home Work Mobile    [place] Spouse [phone]   [phone]       Current Medical History  Patient Admitting Diagnosis: CVA   History of Present Illness:  70 y.o. 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   5 steps to entry.  He presented on 07/19/2020 with diplopia and dizziness. Cranial CT scan unremarkable for acute intracranial process.  Patient did not receive TPA.  MRI showed small acute infarct at the left pontine medullary junction and left cerebellar white matter.  Small remote left parietal infarct.  Chronic lacunar infarct at the left thalamus.  Admission chemistries unremarkable aside BUN 26, creatinine 1.64, urine drug screen negative, alcohol  negative.  Carotid Dopplers pending.  Patient is currently maintained on Plavix for CVA prophylaxis.  Subcutaneous Lovenox for DVT prophylaxis.  Hospital course complicated by bouts of sinus bradycardia that was chronic and currently off any beta-blocker.     Complete NIHSS TOTAL: 2   Past Medical History      Past Medical History:  Diagnosis Date  . Angina      NO ANGINA SINCE CABG  . Arthritis      right ankle  . Chronic daily headache      "@ least every other day""improved since heart surgery"  . Coronary artery disease      PT STATES HEART DOING WELL SINCE CABG SURGERY - DR. TILLEY IS HIS CARDIOLOGIST  . Diabetes mellitus      Lantus x 10 yrs  . Gout    . History of kidney stones 09-08-12    multiple times with some lithotripsies  . Hyperlipemia    . Hypertension        Family History  family history includes Cataracts in his father and mother; Diabetes in his paternal aunt and paternal grandmother.   Prior Rehab/Hospitalizations:  Has the patient had prior rehab or hospitalizations prior to admission? Yes   Has the patient had major surgery during 100 days prior to admission? No   Current Medications    Current Facility-Administered Medications:  .  acetaminophen (TYLENOL) tablet 650 mg, 650 mg, Oral, Q4H PRN **OR** acetaminophen (TYLENOL) 160 MG/5ML solution 650 mg, 650 mg, Per Tube, Q4H PRN **OR** acetaminophen (TYLENOL) suppository 650 mg, 650 mg, Rectal, Q4H PRN, Wynetta Fines T, MD .   allopurinol (ZYLOPRIM) tablet 300 mg, 300 mg, Oral, Daily, Wynetta Fines T, MD, 300 mg at 07/25/20 0933 .  aspirin EC tablet 81 mg, 81 mg, Oral, Daily, Olivencia-Simmons, Ivelisse, NP, 81 mg at 07/25/20 0933 .  atorvastatin (LIPITOR) tablet 40 mg, 40 mg, Oral, Daily, Wynetta Fines T, MD, 40 mg at 07/25/20 0932 .  enoxaparin (LOVENOX) injection 40 mg, 40 mg, Subcutaneous, Q24H, Wynetta Fines T, MD, 40 mg at 07/24/20 2109 .  hydrALAZINE (APRESOLINE) tablet 25 mg, 25 mg, Oral, Q6H PRN, Wynetta Fines T, MD .  insulin aspart (novoLOG) injection 0-15 Units, 0-15 Units, Subcutaneous, TID WC, Lequita Halt, MD, 3 Units at 07/24/20 1724 .  insulin glargine (LANTUS) injection 100 Units, 100 Units, Subcutaneous, q AM, Lequita Halt, MD, 100 Units at 07/24/20 0805 .  Ipratropium-Albuterol (COMBIVENT) respimat 1 puff, 1 puff, Inhalation, Q6H PRN, Wynetta Fines T, MD .  LORazepam (ATIVAN) tablet 0.5 mg, 0.5 mg, Oral, Daily PRN, Wynetta Fines T, MD .  magnesium oxide (MAG-OX) tablet 400 mg, 400 mg, Oral, BID, Wynetta Fines T, MD, 400 mg at 07/25/20 0933 .  melatonin tablet 3 mg, 3 mg, Oral, QHS, Mikhail, [place], DO, 3 mg at 07/24/20 2109 .  multivitamin with minerals tablet 1 tablet, 1 tablet, Oral, Daily, Lequita Halt, MD, 1 tablet at 07/25/20 [phone] .  omega-3 acid ethyl esters (LOVAZA) capsule 1 g, 1 g, Oral, BID, Wynetta Fines T, MD, 1 g at 07/25/20 0933 .  pantoprazole (PROTONIX) EC tablet 40 mg, 40 mg, Oral, Daily, Wynetta Fines T, MD, 40 mg at 07/25/20 0933 .  senna-docusate (Senokot-S) tablet 1 tablet, 1 tablet, Oral, QHS PRN, Wynetta Fines T, MD .  ticagrelor [facility]) tablet 90 mg, 90 mg, Oral, BID, Skeet Simmer, RPH, 90 mg at 07/25/20 8413 .  traMADol (ULTRAM) tablet 50 mg, 50 mg, Oral,  Q6H PRN, Wynetta Fines T, MD, 50 mg at 07/25/20 0019 .  zolpidem (AMBIEN) tablet 5 mg, 5 mg, Oral, QHS PRN, Cristal Ford, DO, 5 mg at 07/24/20 2146   Patients Current Diet:     Diet Order                      Diet heart  healthy/carb modified Room service appropriate? 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  Independent   Indoor Mobility: Did the patient need assistance with walking from room to room (with or without device)? Independent   Stairs: Did the patient need assistance with internal or external stairs (with or without device)? Independent   Functional Cognition: Did the patient need help planning regular tasks such as shopping or remembering to take medications? Independent   Home Assistive Devices / Equipment Home Equipment: None   Prior Device Use: Indicate devices/aids used by the patient prior to current illness, exacerbation or injury? None of the above   Current Functional Level Cognition   Overall Cognitive Status: Impaired/Different from baseline Current Attention Level: Selective Orientation Level: Oriented X4 Safety/Judgement: Decreased awareness of deficits,Decreased awareness of safety General Comments: Follows cues well, however requires max repeated cuing for form and safety    Extremity Assessment (includes Sensation/Coordination)   Upper Extremity Assessment: RUE deficits/detail RUE Deficits / Details: +drift; weaker distally; grip @ 4/5; not initiating use of RUE at times; appeasr unaware of position of hand; reports "it feels a little  different". AROM overall WFL; decreased fine-motor and in0hand amnipulation skills. Pt reports dropping his utensils RUE Sensation: decreased light touch,decreased proprioception RUE Coordination: decreased fine motor  Lower Extremity Assessment: Defer to PT evaluation RLE Deficits / Details: R LE grossly 4/5 RLE Coordination: decreased fine motor     ADLs   Overall ADL's : Needs assistance/impaired Eating/Feeding: Set up Eating/Feeding Details (indicate cue type and reason): dropping utensils with R hand; May benefit form red tubing Grooming: Set up,Supervision/safety,Sitting Upper Body Bathing: Set up,Sitting Lower Body Bathing: Sit to/from stand,Minimal assistance Upper Body Dressing : Set up,Supervision/safety,Sitting Lower Body Dressing: Minimal assistance,Sit to/from stand Toilet Transfer: Minimal assistance,Ambulation Toileting- Clothing Manipulation and Hygiene: Min guard,Sit to/from stand Functional mobility during ADLs: Minimal assistance (unsteady; posteiror LOB)     Mobility   Overal bed mobility: Modified Independent General bed mobility comments: sitting EOB upon PT arrival     Transfers   Overall transfer level: Needs assistance Equipment used: 1 person hand held assist Transfers: Sit to/from Stand Sit to Stand: Min assist General transfer comment: min assist for rise, steadying upon standing via HHA. STS x2.     Ambulation / Gait / Stairs / Wheelchair Mobility   Ambulation/Gait Ambulation/Gait assistance: Min assist,Mod assist Gait Distance (Feet): 80 Feet (+50) Assistive device: Quad cane,1  person hand held assist Gait Pattern/deviations: Step-to pattern,Decreased step length - right,Trunk flexed,Narrow base of support General Gait Details: Min assist to steady, posterior knee facilitation to prevent frequent genu recurvatum, correct LOB especially when challenged (side-stepping, directional changes). First gait attempt with chair rail in hallway and HHA,  converted to quad cane only. VC for sequencing with cane, step-to gait. Gait velocity: decr Gait velocity interpretation: <1.8 ft/sec, indicate of risk for recurrent falls     Posture / Balance Balance Overall balance assessment: Needs assistance Sitting-balance support: No upper extremity supported,Feet supported Sitting balance-Leahy Scale: Good Standing balance support: No upper extremity supported,During functional activity Standing balance-Leahy Scale: Poor Standing balance comment: requires external support     Special needs/care consideration Hgb A1c 8.5 Loop recorder implanted pta Sleep Smart study participant      Previous Home Environment  Living Arrangements: Spouse/significant other  Lives With: Spouse Available Help at Discharge: Family,Available 24 hours/day Type of Home: House Home Layout: Two level Alternate Level Stairs-Rails: Left Alternate Level Stairs-Number of Steps: 12 Home Access: Stairs to enter Entrance Stairs-Rails: None Entrance Stairs-Number of Steps: 5 Bathroom Shower/Tub: Chiropodist: Standard Bathroom Accessibility: Yes How Accessible: Accessible via walker Home Care Services: No Additional Comments: They are selling this home and moving into their townhome   Discharge Living Setting Plans for Discharge Living Setting: Patient's home,Lives with (comment),Other (Comment) (townhome with wife) Type of Home at Discharge: Other (Comment) (townhome) Discharge Home Layout: Two level,1/2 bath on main level,Bed/bath upstairs Alternate Level Stairs-Rails: Left Alternate Level Stairs-Number of Steps: 12 Discharge Home Access: Level entry (5 steps down from parking lot to sidewalk and into level entry) Discharge Bathroom Shower/Tub: Walk-in shower Discharge Bathroom Toilet: Standard Discharge Bathroom Accessibility: Yes How Accessible: Accessible via walker Does the patient have any problems obtaining your medications?: No    Social/Family/Support Systems Patient Roles: Spouse (employee) Contact Information: wife, [name] Anticipated Caregiver: wife Anticipated Ambulance person Information: see above Ability/Limitations of Caregiver: wife is a Educational psychologist Availability: Intermittent Discharge Plan Discussed with Primary Caregiver: Yes Is Caregiver In Agreement with Plan?: Yes Does Caregiver/Family have Issues with Lodging/Transportation while Pt is in Rehab?: No   Goals Patient/Family Goal for Rehab: Mod I with PT and OT Expected length of stay: ELOS 7 to 12 days Additional Information: Patient and wife are placing their home on market to sale and are moving to their townhome at his discharge Pt/Family Agrees to Admission and willing to participate: Yes Program Orientation Provided & Reviewed with Pt/Caregiver Including Roles  & Responsibilities: Yes   Decrease burden of Care through IP rehab admission: n/a   Possible need for SNF placement upon discharge:not anticipated   Patient Condition: This patient's condition remains as documented in the consult dated 07/24/2020, in which the Rehabilitation Physician determined and documented that the patient's condition is appropriate for intensive rehabilitative care in an inpatient rehabilitation facility. Will admit to inpatient rehab today.   Preadmission Screen Completed By:  Cleatrice Burke, RN, 07/25/2020 10:35 AM ______________________________________________________________________   Discussed status with Dr. Dagoberto Ligas on 07/25/2020 at  1040 and received approval for admission today.   Admission Coordinator:  Cleatrice Burke, time 6269 Date 07/25/2020             Cosigned by: Courtney Heys, MD at 07/25/2020 11:11 AM    Revision History                    Note Details  Author Cristina Gong,  RN File Time 07/25/2020 10:41 AM  Author Type Rehab Admission Coordinator Status Signed  Last Editor Cristina Gong, RN  Service Physical Medicine and Rehabilitation

## 2020-07-25 NOTE — Progress Notes (Signed)
Inpatient Rehabilitation Admissions Coordinator  I met with patient and his wife at bedside. We discussed goals and expectations of a CIR admit. They are in agreement and I have a bed available today. I have notified Dr. Ree Kida, Dr Leonie Man, acute team and TOC. I will make the arrangements to admit today.  Danne Baxter, RN, MSN Rehab Admissions Coordinator [phone] 07/25/2020 10:16 AM

## 2020-07-25 NOTE — Evaluation (Signed)
Speech Language Pathology Evaluation Patient Details Name: Mitchell Lambert MRN: [id number] DOB: 09-28-1950 Today's Date: 07/25/2020 Time: 1044-1100 SLP Time Calculation (min) (ACUTE ONLY): 16 min  Problem List:  Patient Active Problem List   Diagnosis Date Noted  . Essential hypertension   . History of CVA (cerebrovascular accident)   . CKD (chronic kidney disease), stage II   . Diabetes mellitus type 2 in obese ([place])   . Dyslipidemia   . CVA (cerebral vascular accident) ([place]) 07/21/2020  . Cryptogenic stroke ([place]) 06/18/2020  . Acute-on-chronic kidney injury ([place]) 05/28/2020  . Stroke ([place]) 05/27/2020  . Elevated troponin 01/13/2019  . COVID-19 virus infection 01/13/2019  . S/P CABG (coronary artery bypass graft)   . CAD (coronary artery disease) 04/28/2011  . Insulin dependent type 2 diabetes mellitus, controlled ([place])   . Hyperlipidemia   . Hypertension   . Obesity (BMI 30-39.9)   . Nephrolithiasis, uric acid    Past Medical History:  Past Medical History:  Diagnosis Date  . Angina    NO ANGINA SINCE CABG  . Arthritis    right ankle  . Chronic daily headache    "@ least every other day""improved since heart surgery"  . Coronary artery disease    PT STATES HEART DOING WELL SINCE CABG SURGERY - DR. TILLEY IS HIS CARDIOLOGIST  . Diabetes mellitus    Lantus x 10 yrs  . Gout   . History of kidney stones 09-08-12   multiple times with some lithotripsies  . Hyperlipemia   . Hypertension    Past Surgical History:  Past Surgical History:  Procedure Laterality Date  . BACK SURGERY     microsurgery: spinal stenosis -lumbar  . bone spur  1990's   right great toe; "probably related to gout"  . BUBBLE STUDY  06/17/2020   Procedure: BUBBLE STUDY;  Surgeon: Lelon Perla, MD;  Location: [place];  Service: Cardiovascular;;  . CARDIAC CATHETERIZATION  04/27/11  . CATARACT EXTRACTION W/ INTRAOCULAR LENS IMPLANT  1990's   right; "lens was replaced twice over 3 months;  lens is actually over iris"  . CORONARY ARTERY BYPASS GRAFT  04/30/2011   Procedure: CORONARY ARTERY BYPASS GRAFTING (CABG);  Surgeon: Melrose Nakayama, MD;  Location: [place];  Service: Open Heart Surgery;  Laterality: N/A;,x4 vessels  . CYSTOSCOPY W/ URETERAL STENT PLACEMENT Right 08/19/2012   Procedure: CYSTOSCOPY WITH RETROGRADE PYELOGRAM/URETERAL STENT PLACEMENT;  Surgeon: Molli Hazard, MD;  Location: [facility];  Service: Urology;  Laterality: Right;  . CYSTOSCOPY WITH RETROGRADE PYELOGRAM, URETEROSCOPY AND STENT PLACEMENT Left 07/17/2013   Procedure: CYSTOSCOPY, LEFT URETEROSCOPY, BASKET EXTRACTION OF STONE, INSERTION OF LEFT URETERAL STENT, ;  Surgeon: Jorja Loa, MD;  Location: [facility];  Service: Urology;  Laterality: Left;  . CYSTOSCOPY/RETROGRADE/URETEROSCOPY Right 09/12/2012   Procedure: CYSTOSCOPY/RETROGRADE/URETEROSCOPY;  Surgeon: Franchot Gallo, MD;  Location: [facility];  Service: Urology;  Laterality: Right;  . HOLMIUM LASER APPLICATION Right 2/37/6283   Procedure: HOLMIUM LASER APPLICATION;  Surgeon: Franchot Gallo, MD;  Location: [facility];  Service: Urology;  Laterality: Right;  . HOLMIUM LASER APPLICATION Left 1/51/7616   Procedure: HOLMIUM LASER APPLICATION;  Surgeon: Jorja Loa, MD;  Location: [facility];  Service: Urology;  Laterality: Left;  . KIDNEY STONE SURGERY     "probably 3 times"  . LEFT HEART CATHETERIZATION WITH CORONARY ANGIOGRAM N/A 04/28/2011   Procedure: LEFT HEART CATHETERIZATION WITH CORONARY ANGIOGRAM;  Surgeon: Jacolyn Reedy, MD;  Location: [facility] CATH LAB;  Service: Cardiovascular;  Laterality: N/A;  . LITHOTRIPSY     "many; probably 5 times"  . RADIAL ARTERY HARVEST  04/30/2011   Procedure: RADIAL ARTERY HARVEST;  Surgeon: Melrose Nakayama, MD;  Location: [place];  Service: Open Heart Surgery;  Laterality: Left;  . RETINAL DETACHMENT SURGERY  1990's   right eye; "made me have an early cataract"  . TEE WITHOUT CARDIOVERSION N/A 06/17/2020    Procedure: TRANSESOPHAGEAL ECHOCARDIOGRAM (TEE);  Surgeon: Lelon Perla, MD;  Location[facility] ENDOSCOPY;  Service: Cardiovascular;  Laterality: N/A;  . VASECTOMY     HPI:  70 y/o male presented to [facility] ED for concern of dizziness and diplopia. MRI showed acute L pontine CVA junction and  left cerebral white matter; remote left parietal infarct. 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 PMH: CAD s/p CABG, HTN, asthma, type 2 diabetes, CKD stage III, HLD, remote R retinal detachment, HTN, CVA   Assessment / Plan / Recommendation Clinical Impression  Pt presents with mild cognitive deficits following CVA. Portions of SLUMS administered. Pt exhibited deficits in delayed recall 3/5, mental manipulation, and slowed processing. Per PT/OT notes, reduced safety awarenss exhibited with functional tasks. PLOF pt very independent, was working full time and independent with all ADLs. Receptive and expressive language and motor speech skills were intact. Recommend supervisioin with complex ADLs and continued cognitive intervention at next level of care. Pt with planned DC to [facility] this date.    SLP Assessment  SLP Recommendation/Assessment: All further Speech Lanaguage Pathology  needs can be addressed in the next venue of care SLP Visit Diagnosis: Cognitive communication deficit (R41.841)    Follow Up Recommendations  Inpatient Rehab    Frequency and Duration  @ next level of care         SLP Evaluation Cognition  Overall Cognitive Status: Impaired/Different from baseline Arousal/Alertness: Awake/alert Orientation Level: Oriented to person;Oriented to place;Oriented to situation;Oriented to time Attention: Alternating;Sustained Sustained Attention: Impaired Sustained Attention Impairment: Verbal complex;Functional complex Alternating Attention: Impaired Alternating Attention Impairment: Verbal complex;Functional complex Memory: Impaired Memory Impairment: Decreased recall of new  information Awareness: Impaired Awareness Impairment: Emergent impairment Problem Solving: Impaired Problem Solving Impairment: Verbal complex;Functional complex Executive Function: Organizing;Self Monitoring Organizing: Impaired Organizing Impairment: Verbal complex;Functional complex Safety/Judgment: Impaired Comments: during functional tasks with PT/OT per documentation       Comprehension  Auditory Comprehension Overall Auditory Comprehension: Appears within functional limits for tasks assessed Reading Comprehension Reading Status: Within funtional limits    Expression Expression Primary Mode of Expression: Verbal Verbal Expression Overall Verbal Expression: Appears within functional limits for tasks assessed Written Expression Dominant Hand: Right   Oral / Motor  Oral Motor/Sensory Function Overall Oral Motor/Sensory Function: Within functional limits   GO                    Hayden Rasmussen MA, CCC-SLP Acute Rehabilitation Services   07/25/2020, 11:12 AM

## 2020-07-25 NOTE — Progress Notes (Signed)
[name], MD  Physician  Physical Medicine and Rehabilitation  Consult Note     Signed  Date of Service:  07/24/2020  5:34 AM      Related encounter: ED to Hosp-Admission (Discharged) from 07/21/2020 in [facility]       Signed      Expand All Collapse All     Show:Clear all [x] Manual[x] Template[] Copied  Added by: [x] Angiulli, Lavon Paganini, PA-C[x] [name], MD   [] Hover for details           Physical Medicine and Rehabilitation Consult Reason for Consult: Dizziness and diplopia Referring Physician: Dr.Pokhrel     HPI: Mitchell Lambert is a 70 y.o. 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  History taken from chart review and patient.  Patient lives with spouse independent prior to admission.  Two-level home 5 steps to entry.  He presented on 07/19/2020 with diplopia and dizziness. Cranial CT scan unremarkable for acute intracranial process.  Patient did not receive tPA.  MRI showed small acute infarct at the left pontine medullary junction and left cerebellar white matter.  Small remote left parietal infarct.  Chronic lacunar infarct at the left thalamus.  Admission chemistries unremarkable aside BUN 26, creatinine 1.64, urine drug screen negative, alcohol negative.  Carotid Dopplers pending.  Patient is currently maintained on Plavix for CVA prophylaxis.  Subcutaneous Lovenox for DVT prophylaxis.  Hospital course complicated by bouts of sinus bradycardia that was chronic and currently off any beta-blocker.  Therapy evaluations completed due to patient decreased functional mobility recommendations of physical medicine rehab consult.   Review of Systems  Constitutional: Negative for chills and fever.  HENT: Negative for hearing loss.   Eyes: Positive for double vision.  Respiratory: Negative for cough  and shortness of breath.   Cardiovascular: Negative for chest pain and palpitations.  Gastrointestinal: Positive for constipation. Negative for heartburn, nausea and vomiting.  Genitourinary: Negative for dysuria and hematuria.  Musculoskeletal: Positive for joint pain and myalgias.  Skin: Negative for rash.  Neurological: Positive for dizziness, focal weakness, weakness and headaches.  All other systems reviewed and are negative.       Past Medical History:  Diagnosis Date  . Angina      NO ANGINA SINCE CABG  . Arthritis      right ankle  . Chronic daily headache      "@ least every other day""improved since heart surgery"  . Coronary artery disease      PT STATES HEART DOING WELL SINCE CABG SURGERY - DR. TILLEY IS HIS CARDIOLOGIST  . Diabetes mellitus      Lantus x 10 yrs  . Gout    . History of kidney stones 09-08-12    multiple times with some lithotripsies  . Hyperlipemia    . Hypertension           Past Surgical History:  Procedure Laterality Date  . BACK SURGERY        microsurgery: spinal stenosis -lumbar  . bone spur   1990's    right great toe; "probably related to gout"  . BUBBLE STUDY   06/17/2020    Procedure: BUBBLE STUDY;  Surgeon: Lelon Perla, MD;  Location: [facility];  Service: Cardiovascular;;  . CARDIAC CATHETERIZATION   04/27/11  . CATARACT EXTRACTION W/ INTRAOCULAR LENS IMPLANT   1990's  right; "lens was replaced twice over 3 months; lens is actually over iris"  . CORONARY ARTERY BYPASS GRAFT   04/30/2011    Procedure: CORONARY ARTERY BYPASS GRAFTING (CABG);  Surgeon: Melrose Nakayama, MD;  Location: [facility];  Service: Open Heart Surgery;  Laterality: N/A;,x4 vessels  . CYSTOSCOPY W/ URETERAL STENT PLACEMENT Right 08/19/2012    Procedure: CYSTOSCOPY WITH RETROGRADE PYELOGRAM/URETERAL STENT PLACEMENT;  Surgeon: Molli Hazard, MD;  Location: [facility];  Service: Urology;  Laterality: Right;  . CYSTOSCOPY WITH RETROGRADE PYELOGRAM,  URETEROSCOPY AND STENT PLACEMENT Left 07/17/2013    Procedure: CYSTOSCOPY, LEFT URETEROSCOPY, BASKET EXTRACTION OF STONE, INSERTION OF LEFT URETERAL STENT, ;  Surgeon: Jorja Loa, MD;  Location: [facility];  Service: Urology;  Laterality: Left;  . CYSTOSCOPY/RETROGRADE/URETEROSCOPY Right 09/12/2012    Procedure: CYSTOSCOPY/RETROGRADE/URETEROSCOPY;  Surgeon: Franchot Gallo, MD;  Location: [facility];  Service: Urology;  Laterality: Right;  . HOLMIUM LASER APPLICATION Right 9/70/2637    Procedure: HOLMIUM LASER APPLICATION;  Surgeon: Franchot Gallo, MD;  Location: [facility];  Service: Urology;  Laterality: Right;  . HOLMIUM LASER APPLICATION Left 8/58/8502    Procedure: HOLMIUM LASER APPLICATION;  Surgeon: Jorja Loa, MD;  Location: [facility];  Service: Urology;  Laterality: Left;  . KIDNEY STONE SURGERY        "probably 3 times"  . LEFT HEART CATHETERIZATION WITH CORONARY ANGIOGRAM N/A 04/28/2011    Procedure: LEFT HEART CATHETERIZATION WITH CORONARY ANGIOGRAM;  Surgeon: Jacolyn Reedy, MD;  Location: [facility] CATH LAB;  Service: Cardiovascular;  Laterality: N/A;  . LITHOTRIPSY        "many; probably 5 times"  . RADIAL ARTERY HARVEST   04/30/2011    Procedure: RADIAL ARTERY HARVEST;  Surgeon: Melrose Nakayama, MD;  Location: [facility];  Service: Open Heart Surgery;  Laterality: Left;  . RETINAL DETACHMENT SURGERY   1990's    right eye; "made me have an early cataract"  . TEE WITHOUT CARDIOVERSION N/A 06/17/2020    Procedure: TRANSESOPHAGEAL ECHOCARDIOGRAM (TEE);  Surgeon: Lelon Perla, MD;  Location: [facility] ENDOSCOPY;  Service: Cardiovascular;  Laterality: N/A;  . VASECTOMY             Family History  Problem Relation Age of Onset  . Cataracts Father    . Cataracts Mother    . Diabetes Paternal Aunt    . Diabetes Paternal Grandmother    . Amblyopia Neg Hx    . Blindness Neg Hx    . Glaucoma Neg Hx    . Macular degeneration Neg Hx    . Retinal detachment Neg Hx    . Strabismus Neg  Hx    . Retinitis pigmentosa Neg Hx      Social History:  reports that he has never smoked. He has never used smokeless tobacco. He reports current alcohol use. He reports current drug use. Drug: Marijuana. Allergies:       Allergies  Allergen Reactions  . Gabapentin Other (See Comments)      "made me so dizzy I missed work for 2 days"  . Metformin And Related Shortness Of Breath  . Sulfa Antibiotics Shortness Of Breath and Rash  . Azithromycin Other (See Comments)      not good for heart condition  . Ciprofloxacin Other (See Comments)      tendon rupture  . Lisinopril Cough  . Losartan Potassium Other (See Comments)      hypotension  . Olmesartan Cough  . Simvastatin Cough  .  Amoxicillin Rash and Other (See Comments)      rash Did it involve swelling of the face/tongue/throat, SOB, or low BP? N Did it involve sudden or severe rash/hives, skin peeling, or any reaction on the inside of your mouth or nose?  Y Did you need to seek medical attention at a hospital or doctor's office? N When did it last happen?  "years and years ago"     If all above answers are "NO", may proceed with cephalosporin use.    Marland Kitchen Doxycycline Rash  . Penicillins Rash          Medications Prior to Admission  Medication Sig Dispense Refill  . acetaminophen (TYLENOL) 325 MG tablet Take 325-650 mg by mouth every 6 (six) hours as needed for mild pain, fever or headache.      . allopurinol (ZYLOPRIM) 300 MG tablet Take 300 mg by mouth daily.      Marland Kitchen amLODipine (NORVASC) 5 MG tablet TAKE 1 TABLET(5 MG) BY MOUTH DAILY (Patient taking differently: Take 5 mg by mouth daily.) 90 tablet 3  . atorvastatin (LIPITOR) 40 MG tablet Take 40 mg by mouth daily.      . Biotin 5000 MCG TABS Take 5,000 mcg by mouth daily.       . bisoprolol (ZEBETA) 5 MG tablet Take 5 mg by mouth daily.      . clopidogrel (PLAVIX) 75 MG tablet Take 1 tablet (75 mg total) by mouth daily. 30 tablet 0  . fish oil-omega-3 fatty acids 1000 MG  capsule Take 1 g by mouth 2 (two) times daily.      Marland Kitchen glucosamine-chondroitin 500-400 MG tablet Take 2 tablets by mouth daily.      Marland Kitchen ibuprofen (ADVIL) 200 MG tablet Take 200 mg by mouth every 6 (six) hours as needed for fever, headache or mild pain.      Marland Kitchen insulin aspart (NOVOLOG FLEXPEN) 100 UNIT/ML FlexPen Inject 10-15 Units into the skin as needed for high blood sugar.      . Insulin Glargine (BASAGLAR KWIKPEN) 100 UNIT/ML SOPN Inject 100 Units into the skin in the morning. In AM      . irbesartan (AVAPRO) 300 MG tablet Take 1 tablet (300 mg total) by mouth daily. 90 tablet 0  . magnesium oxide (MAG-OX) 400 MG tablet Take 400 mg by mouth 2 (two) times daily.       . Multiple Vitamins-Minerals (MULTIVITAMIN WITH MINERALS) tablet Take 1 tablet by mouth daily.      . nitroGLYCERIN (NITROSTAT) 0.4 MG SL tablet Place 0.4 mg under the tongue as needed for chest pain.      Marland Kitchen oxymetazoline (AFRIN) 0.05 % nasal spray Place 1 spray into both nostrils at bedtime as needed for congestion.      . traMADol (ULTRAM) 50 MG tablet Take 50 mg by mouth daily.      Marland Kitchen ACCU-CHEK GUIDE test strip USE TO TEST BLOOD SUGAR 2 TO 3 TIMES A DAY   5  . Alirocumab (PRALUENT) 150 MG/ML SOAJ Inject 150 mg into the skin every 14 (fourteen) days. (Patient not taking: Reported on 07/22/2020) 2 mL 11  . B-D ULTRAFINE III SHORT PEN 31G X 8 MM MISC USE UTD WITH FLEXPEN.   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 Pt able to self correct but needed support on R side while he gained balance Ambulation/Gait Ambulation/Gait assistance: Min assist Gait Distance (Feet): 145 Feet (15', 15', 100', 15') Assistive device: None,1 person hand held assist Gait Pattern/deviations: Step-through pattern,Decreased stride length,Wide base of support,Ataxic,Staggering right General Gait Details: pt with numerous LOB to R, especially with turning. Pt first closed L eye on his own and had 2-3 R staggers. Next bout he covered L eye with his R and stability was worse, neededing mod A to prevent a fall. With longer distance he again self closed his L eye and was given R sided HHA. Last bout he covered his R eye and balance was actually a bit better with this but he reports everything being smaller due to his past surgery on that eye. Gait velocity: decreased Gait velocity interpretation: <1.8 ft/sec, indicate of risk for recurrent falls   ADL: ADL Overall ADL's : Needs assistance/impaired Eating/Feeding: Set up Eating/Feeding Details (indicate cue type and reason): dropping utensils with R hand; May benefit form red tubing Grooming: Set up,Supervision/safety,Sitting Upper Body Bathing: Set up,Sitting Lower Body Bathing: Sit to/from stand,Minimal assistance Upper Body Dressing : Set up,Supervision/safety,Sitting Lower Body Dressing: Minimal assistance,Sit to/from stand Toilet Transfer: Minimal  assistance,Ambulation Toileting- Clothing Manipulation and Hygiene: Min guard,Sit to/from stand Functional mobility during ADLs: Minimal assistance (unsteady; posteiror LOB)   Cognition: Cognition Overall Cognitive Status: Impaired/Different from baseline Orientation Level: Oriented X4 Cognition Arousal/Alertness: Awake/alert Behavior During Therapy: WFL for tasks assessed/performed Overall Cognitive Status: Impaired/Different from baseline Area of Impairment: Safety/judgement,Awareness,Attention Current Attention Level: Selective Safety/Judgement: Decreased awareness of deficits,Decreased awareness of safety Awareness: Emergent General Comments: Pt with decreased insight into balance deficits; R dominanat and not reaching or initiating use of RUE during functional tasks   Blood pressure (!) 152/72, pulse (!) 55, temperature 98 F (36.7 C), temperature source Oral, resp. rate 18, height 6' (1.829 m), weight 108.9 kg, SpO2 98 %. Physical Exam Vitals reviewed.  Constitutional:      General: He is not in acute distress.    Appearance: He is obese. He is not ill-appearing.  HENT:     Head: Normocephalic and atraumatic.     Right Ear: External ear normal.     Left Ear: External ear normal.     Nose: Nose normal.  Eyes:     General:        Right eye: No discharge.        Left eye: No discharge.     Extraocular Movements: Extraocular movements intact.  Cardiovascular:     Rate and Rhythm: Normal rate and regular rhythm.  Pulmonary:     Effort: Pulmonary effort is normal. No respiratory distress.     Breath sounds: No stridor.  Abdominal:     General: Abdomen is flat. Bowel sounds are normal. There  is no distension.  Musculoskeletal:     Cervical back: Normal range of motion and neck supple.     Comments: No edema or tenderness in extremities  Skin:    General: Skin is warm and dry.  Neurological:     Mental Status: He is alert.     Comments: Alert Provides name and age.    Follows simple commands. Motor: LUE/LLE: 5/5 proximal distal RUE: 3 -/5 proximal distal RLE: 4/5 proximal to distal Sensation intact light touch  Psychiatric:        Mood and Affect: Mood normal.        Behavior: Behavior normal.        Lab Results Last 24 Hours       Results for orders placed or performed during the hospital encounter of 07/21/20 (from the past 24 hour(s))  Glucose, capillary     Status: Abnormal    Collection Time: 07/23/20  3:50 PM  Result Value Ref Range    Glucose-Capillary 174 (H) 70 - 99 mg/dL  Glucose, capillary     Status: Abnormal    Collection Time: 07/23/20  9:11 PM  Result Value Ref Range    Glucose-Capillary 144 (H) 70 - 99 mg/dL  Magnesium     Status: None    Collection Time: 07/24/20  3:13 AM  Result Value Ref Range    Magnesium 1.8 1.7 - 2.4 mg/dL  Phosphorus     Status: None    Collection Time: 07/24/20  3:13 AM  Result Value Ref Range    Phosphorus 3.7 2.5 - 4.6 mg/dL  CBC     Status: None    Collection Time: 07/24/20  3:13 AM  Result Value Ref Range    WBC 7.7 4.0 - 10.5 K/uL    RBC 4.26 4.22 - 5.81 MIL/uL    Hemoglobin 13.4 13.0 - 17.0 g/dL    HCT 39.2 39.0 - 52.0 %    MCV 92.0 80.0 - 100.0 fL    MCH 31.5 26.0 - 34.0 pg    MCHC 34.2 30.0 - 36.0 g/dL    RDW 13.2 11.5 - 15.5 %    Platelets 206 150 - 400 K/uL    nRBC 0.0 0.0 - 0.2 %  Basic metabolic panel     Status: Abnormal    Collection Time: 07/24/20  3:13 AM  Result Value Ref Range    Sodium 140 135 - 145 mmol/L    Potassium 4.3 3.5 - 5.1 mmol/L    Chloride 109 98 - 111 mmol/L    CO2 26 22 - 32 mmol/L    Glucose, Bld 104 (H) 70 - 99 mg/dL    BUN 28 (H) 8 - 23 mg/dL    Creatinine, Ser 1.81 (H) 0.61 - 1.24 mg/dL    Calcium 8.8 (L) 8.9 - 10.3 mg/dL    GFR, Estimated 40 (L) >60 mL/min    Anion gap 5 5 - 15  Glucose, capillary     Status: None    Collection Time: 07/24/20  6:38 AM  Result Value Ref Range    Glucose-Capillary 91 70 - 99 mg/dL  Glucose, capillary      Status: Abnormal    Collection Time: 07/24/20 12:40 PM  Result Value Ref Range    Glucose-Capillary 106 (H) 70 - 99 mg/dL       Imaging Results (Last 48 hours)  MR BRAIN WO CONTRAST   Result Date: 07/23/2020 CLINICAL DATA:  Stroke follow-up EXAM: MRI HEAD WITHOUT  CONTRAST TECHNIQUE: Multiplanar, multiecho pulse sequences of the brain and surrounding structures were obtained without intravenous contrast. COMPARISON:  05/28/2020 FINDINGS: Brain: Restricted diffusion at the left pontomedullary junction and in the left cerebral white matter lateral to the atrium of the lateral ventricle. No acute hemorrhage, hydrocephalus, or collection. Chronic perforator infarct at the left thalamus. Small remote left parietal cortex infarct. Vascular: Preserved flow voids Skull and upper cervical spine: Normal marrow signal Sinuses/Orbits: Progressive, complete opacification of the right maxillary sinus when compared to prior. Postoperative right globe. IMPRESSION: 1. Small acute infarcts at the left pontine medullary junction and left cerebral white matter. 2. Small remote left parietal infarct. Chronic lacunar infarct at the left thalamus. 3. Active right maxillary sinusitis which has progressed from comparison 2 months ago. Electronically Signed   By: Monte Fantasia M.D.   On: 07/23/2020 08:43     Assessment/Plan: Diagnosis: Small acute infarct at the left pontine medullary junction  Stroke: Continue secondary stroke prophylaxis and Risk Factor Modification listed below:   Antiplatelet therapy:   Blood Pressure Management:  Continue current medication with prn's with permisive HTN per primary team Statin Agent:   Diabetes management:   Right sided hemiparesis:  PT/OT for mobility, ADL training  Motor recovery: Fluoxetine Labs independently reviewed.  Records reviewed and summated above.   1. Does the need for close, 24 hr/day medical supervision in concert with the patient's rehab needs make it  unreasonable for this patient to be served in a less intensive setting? Yes\ 2. 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 Due to bladder management, safety, skin/wound care, disease management and patient education, does the patient require 24 hr/day rehab nursing? Yes 4. Does the patient require coordinated care of a physician, rehab nurse, therapy disciplines of PT/OT to address physical and functional deficits in the context of the above medical diagnosis(es)? Yes Addressing deficits in the following areas: balance, endurance, locomotion, strength, transferring, bathing, dressing, toileting and psychosocial support 5. Can the patient actively participate in an intensive therapy program of at least 3 hrs of therapy per day at least 5 days per week? Yes 6. The potential for patient to make measurable gains while on inpatient rehab is excellent 7. Anticipated functional outcomes upon discharge from inpatient rehab are modified independent  with PT, modified independent with OT, n/a with SLP. 8. Estimated rehab length of stay to reach the above functional goals is: 7-12 days. 9. Anticipated discharge destination: Home 10. Overall Rehab/Functional Prognosis: excellent   RECOMMENDATIONS: This patient's condition is appropriate for continued rehabilitative care in the following setting: CIR Patient has agreed to participate in recommended program. Yes Note that insurance prior authorization may be required for reimbursement for recommended care.   Comment: Rehab Admissions Coordinator to follow up.   I have personally performed a face to face diagnostic evaluation, including, but  not limited to relevant history and physical exam findings, of this patient and developed relevant assessment and plan.  Additionally, I have reviewed and concur with the physician assistant's documentation above.    Delice Lesch, MD, ABPMR Lavon Paganini Angiulli, PA-C 07/24/2020          Revision History  Routing History              Note Details  Author Posey Pronto, Domenick Bookbinder, MD File Time 07/24/2020  2:48 PM  Author Type Physician Status Signed  Last Editor [name], MD Service Physical Medicine and Rehabilitation

## 2020-07-25 NOTE — Progress Notes (Signed)
Patient ID: Mitchell Lambert, male   DOB: January 08, 1951, 70 y.o.   MRN: [id number] Met with the patient and wife to review role of the nurse CM and initiate education on secondary stroke risks. Reviewed HTN, HLD (LDL 88, Total 156) and DM (A1C 8.5). 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 Given handouts for DASH diet, CMM diet, CKD and eating plan for increasing magnesium/protein dietary modifications. Continue to follow along to discharge to address educational needs and collaborate with the SW to facilitate preparation for discharge. Margarito Liner

## 2020-07-26 LAB — COMPREHENSIVE METABOLIC PANEL
ALT: 29 U/L (ref 0–44)
AST: 26 U/L (ref 15–41)
Albumin: 2.8 g/dL — ABNORMAL LOW (ref 3.5–5.0)
Alkaline Phosphatase: 113 U/L (ref 38–126)
Anion gap: 7 (ref 5–15)
BUN: 36 mg/dL — ABNORMAL HIGH (ref 8–23)
CO2: 24 mmol/L (ref 22–32)
Calcium: 9.2 mg/dL (ref 8.9–10.3)
Chloride: 108 mmol/L (ref 98–111)
Creatinine, Ser: 1.94 mg/dL — ABNORMAL HIGH (ref 0.61–1.24)
GFR, Estimated: 37 mL/min — ABNORMAL LOW (ref >60)
Glucose, Bld: 151 mg/dL — ABNORMAL HIGH (ref 70–99)
Potassium: 4.2 mmol/L (ref 3.5–5.1)
Sodium: 139 mmol/L (ref 135–145)
Total Bilirubin: 1.1 mg/dL (ref 0.3–1.2)
Total Protein: 6 g/dL — ABNORMAL LOW (ref 6.5–8.1)

## 2020-07-26 LAB — CBC WITH DIFFERENTIAL/PLATELET
Abs Immature Granulocytes: 0.02 10*3/uL (ref 0.00–0.07)
Basophils Absolute: 0.1 10*3/uL (ref 0.0–0.1)
Basophils Relative: 1 %
Eosinophils Absolute: 0.3 10*3/uL (ref 0.0–0.5)
Eosinophils Relative: 3 %
HCT: 41.1 % (ref 39.0–52.0)
Hemoglobin: 14.2 g/dL (ref 13.0–17.0)
Immature Granulocytes: 0 %
Lymphocytes Relative: 20 %
Lymphs Abs: 1.8 10*3/uL (ref 0.7–4.0)
MCH: 30.8 pg (ref 26.0–34.0)
MCHC: 34.5 g/dL (ref 30.0–36.0)
MCV: 89.2 fL (ref 80.0–100.0)
Monocytes Absolute: 0.8 10*3/uL (ref 0.1–1.0)
Monocytes Relative: 9 %
Neutro Abs: 5.9 10*3/uL (ref 1.7–7.7)
Neutrophils Relative %: 67 %
Platelets: 234 10*3/uL (ref 150–400)
RBC: 4.61 MIL/uL (ref 4.22–5.81)
RDW: 13.1 % (ref 11.5–15.5)
WBC: 8.8 10*3/uL (ref 4.0–10.5)
nRBC: 0 % (ref 0.0–0.2)

## 2020-07-26 LAB — GLUCOSE, CAPILLARY
Glucose-Capillary: 149 mg/dL — ABNORMAL HIGH (ref 70–99)
Glucose-Capillary: 178 mg/dL — ABNORMAL HIGH (ref 70–99)
Glucose-Capillary: 119 mg/dL — ABNORMAL HIGH (ref 70–99)
Glucose-Capillary: 228 mg/dL — ABNORMAL HIGH (ref 70–99)

## 2020-07-26 MED ORDER — MAGNESIUM GLUCONATE 500 MG PO TABS
800.0000 mg | ORAL_TABLET | Freq: Every day | ORAL | Status: DC
  Administered 2020-07-26 – 2020-08-03 (×8): 750 mg via ORAL
  Filled 2020-07-26 (×14): qty 2

## 2020-07-26 NOTE — Plan of Care (Signed)
  Problem: RH Balance Goal: LTG Patient will maintain dynamic sitting balance (PT) Description: LTG:  Patient will maintain dynamic sitting balance with assistance during mobility activities (PT) Flowsheets (Taken 07/26/2020 1231) LTG: Pt will maintain dynamic sitting balance during mobility activities with:: Independent with assistive device  Goal: LTG Patient will maintain dynamic standing balance (PT) Description: LTG:  Patient will maintain dynamic standing balance with assistance during mobility activities (PT) Flowsheets (Taken 07/26/2020 1231) LTG: Pt will maintain dynamic standing balance during mobility activities with:: Supervision/Verbal cueing   Problem: Sit to Stand Goal: LTG:  Patient will perform sit to stand with assistance level (PT) Description: LTG:  Patient will perform sit to stand with assistance level (PT) Flowsheets (Taken 07/26/2020 1231) LTG: PT will perform sit to stand in preparation for functional mobility with assistance level: Supervision/Verbal cueing   Problem: RH Bed Mobility Goal: LTG Patient will perform bed mobility with assist (PT) Description: LTG: Patient will perform bed mobility with assistance, with/without cues (PT). Flowsheets (Taken 07/26/2020 1231) LTG: Pt will perform bed mobility with assistance level of: Independent with assistive device    Problem: RH Bed to Chair Transfers Goal: LTG Patient will perform bed/chair transfers w/assist (PT) Description: LTG: Patient will perform bed to chair transfers with assistance (PT). Flowsheets (Taken 07/26/2020 1231) LTG: Pt will perform Bed to Chair Transfers with assistance level: Supervision/Verbal cueing   Problem: RH Car Transfers Goal: LTG Patient will perform car transfers with assist (PT) Description: LTG: Patient will perform car transfers with assistance (PT). Flowsheets (Taken 07/26/2020 1231) LTG: Pt will perform car transfers with assist:: Supervision/Verbal cueing   Problem: RH Furniture  Transfers Goal: LTG Patient will perform furniture transfers w/assist (OT/PT) Description: LTG: Patient will perform furniture transfers  with assistance (OT/PT). Flowsheets (Taken 07/26/2020 1231) LTG: Pt will perform furniture transfers with assist:: Supervision/Verbal cueing   Problem: RH Ambulation Goal: LTG Patient will ambulate in controlled environment (PT) Description: LTG: Patient will ambulate in a controlled environment, # of feet with assistance (PT). Flowsheets (Taken 07/26/2020 1231) LTG: Pt will ambulate in controlled environ  assist needed:: Supervision/Verbal cueing LTG: Ambulation distance in controlled environment: 149ft with LRAD Goal: LTG Patient will ambulate in home environment (PT) Description: LTG: Patient will ambulate in home environment, # of feet with assistance (PT). Flowsheets (Taken 07/26/2020 1231) LTG: Pt will ambulate in home environ  assist needed:: Supervision/Verbal cueing LTG: Ambulation distance in home environment: 85ft   Problem: RH Wheelchair Mobility Goal: LTG Patient will propel w/c in controlled environment (PT) Description: LTG: Patient will propel wheelchair in controlled environment, # of feet with assist (PT) Flowsheets (Taken 07/26/2020 1231) LTG: Pt will propel w/c in controlled environ  assist needed:: Supervision/Verbal cueing LTG: Propel w/c distance in controlled environment: 171ft

## 2020-07-26 NOTE — Progress Notes (Signed)
Inpatient Rehabilitation Care Coordinator Assessment and Plan Patient Details  Name: Mitchell Lambert MRN: [id number] Date of Birth: Mar 05, 1950  Today's Date: 07/26/2020  Hospital Problems: Principal Problem:   Left pontine stroke [facility]) Active Problems:   Acute right hemiparesis [facility])  Past Medical History:  Past Medical History:  Diagnosis Date  . Angina    NO ANGINA SINCE CABG  . Arthritis    right ankle  . Chronic daily headache    "@ least every other day""improved since heart surgery"  . Coronary artery disease    PT STATES HEART DOING WELL SINCE CABG SURGERY - DR. TILLEY IS HIS CARDIOLOGIST  . Diabetes mellitus    Lantus x 10 yrs  . Gout   . History of kidney stones 09-08-12   multiple times with some lithotripsies  . Hyperlipemia   . Hypertension    Past Surgical History:  Past Surgical History:  Procedure Laterality Date  . BACK SURGERY     microsurgery: spinal stenosis -lumbar  . bone spur  1990's   right great toe; "probably related to gout"  . BUBBLE STUDY  06/17/2020   Procedure: BUBBLE STUDY;  Surgeon: Lelon Perla, MD;  Location: [place];  Service: Cardiovascular;;  . CARDIAC CATHETERIZATION  04/27/11  . CATARACT EXTRACTION W/ INTRAOCULAR LENS IMPLANT  1990's   right; "lens was replaced twice over 3 months; lens is actually over iris"  . CORONARY ARTERY BYPASS GRAFT  04/30/2011   Procedure: CORONARY ARTERY BYPASS GRAFTING (CABG);  Surgeon: Melrose Nakayama, MD;  Location: [place];  Service: Open Heart Surgery;  Laterality: N/A;,x4 vessels  . CYSTOSCOPY W/ URETERAL STENT PLACEMENT Right 08/19/2012   Procedure: CYSTOSCOPY WITH RETROGRADE PYELOGRAM/URETERAL STENT PLACEMENT;  Surgeon: Molli Hazard, MD;  Location: [facility];  Service: Urology;  Laterality: Right;  . CYSTOSCOPY WITH RETROGRADE PYELOGRAM, URETEROSCOPY AND STENT PLACEMENT Left 07/17/2013   Procedure: CYSTOSCOPY, LEFT URETEROSCOPY, BASKET EXTRACTION OF STONE, INSERTION OF LEFT URETERAL  STENT, ;  Surgeon: Jorja Loa, MD;  Location: [facility];  Service: Urology;  Laterality: Left;  . CYSTOSCOPY/RETROGRADE/URETEROSCOPY Right 09/12/2012   Procedure: CYSTOSCOPY/RETROGRADE/URETEROSCOPY;  Surgeon: Franchot Gallo, MD;  Location: [facility];  Service: Urology;  Laterality: Right;  . HOLMIUM LASER APPLICATION Right 2/35/5732   Procedure: HOLMIUM LASER APPLICATION;  Surgeon: Franchot Gallo, MD;  Location[facility];  Service: Urology;  Laterality: Right;  . HOLMIUM LASER APPLICATION Left 04/03/5425   Procedure: HOLMIUM LASER APPLICATION;  Surgeon: Jorja Loa, MD;  Location[facility];  Service: Urology;  Laterality: Left;  . KIDNEY STONE SURGERY     "probably 3 times"  . LEFT HEART CATHETERIZATION WITH CORONARY ANGIOGRAM N/A 04/28/2011   Procedure: LEFT HEART CATHETERIZATION WITH CORONARY ANGIOGRAM;  Surgeon: Jacolyn Reedy, MD;  [facility];  Service: Cardiovascular;  Laterality: N/A;  . LITHOTRIPSY     "many; probably 5 times"  . RADIAL ARTERY HARVEST  04/30/2011   Procedure: RADIAL ARTERY HARVEST;  Surgeon: Melrose Nakayama, MD;  Location: [place];  Service: Open Heart Surgery;  Laterality: Left;  . RETINAL DETACHMENT SURGERY  1990's   right eye; "made me have an early cataract"  . TEE WITHOUT CARDIOVERSION N/A 06/17/2020   Procedure: TRANSESOPHAGEAL ECHOCARDIOGRAM (TEE);  Surgeon: Lelon Perla, MD;  Location: [facility] ENDOSCOPY;  Service: Cardiovascular;  Laterality: N/A;  . VASECTOMY     Social History:  reports that he has never smoked. He has never used smokeless tobacco. He reports current alcohol use.  He reports current drug use. Drug: Marijuana.  Family / Support Systems Marital Status: Married Patient Roles: Spouse Spouse/Significant Other: Stacie Glaze Anticipated Caregiver: spouse Ability/Limitations of Caregiver: Educational psychologist Availability: Intermittent  Social History Preferred language: English Religion: [employer] Read: Yes Write: Yes Employment Status: Employed Name of Employer: [employer] Return to Work Plans: TBD Public relations account executive Issues: n/a Guardian/Conservator: Stacie Glaze   Abuse/Neglect Abuse/Neglect Assessment Can Be Completed: Yes Physical Abuse: Denies Verbal Abuse: Denies Sexual Abuse: Denies Exploitation of patient/patient's resources: Denies Self-Neglect: Denies  Emotional Status Pt's affect, behavior and adjustment status: pleasant Recent Psychosocial Issues: n/a Psychiatric History: n/a Substance Abuse History: n/a  Patient / Family Perceptions, Expectations & Goals Pt/Family understanding of illness & functional limitations: yes Premorbid pt/family roles/activities: Independent, working, driving, Physiological scientist Anticipated changes in roles/activities/participation: pt anticipated to meet MOD I goals Pt/family expectations/goals: MOD I  Recruitment consultant: None Premorbid Home Care/DME Agencies: None Transportation available at discharge: Family able to transport  Discharge Planning Living Arrangements: Spouse/significant other [place]: Spouse/significant other Type of Residence: Private residence (2 level townhome, 1/2 bath on main, 12 steps to 2nd level, 5 steps in parking lot) Insurance Resources: Chartered certified accountant Resources: Employment Financial Screen Referred: No Living Expenses: Own Money Management: Patient,Spouse Does the patient have any problems obtaining your medications?: No Home Management: Independent Patient/Family Preliminary Plans: anticipated MOD I goals Care Coordinator Anticipated Follow Up Needs: HH/OP Expected length of stay: 7-12 Days  Clinical Impression SW met with pt, introduced self, explained role and provided educational handouts. Pt concerned about receiving the wrong lunch tray, sw ordered pt a new tray. Pt plans to d/c home MOD I due to spouse being a realtor. No additional questions  or concern. Sw will cont to follow up  Dyanne Iha 07/26/2020, 1:10 PM

## 2020-07-26 NOTE — Progress Notes (Signed)
Patient is resting in bed alert and cooperative, able to verbalize his needs appropriately to staff, no acute distress noted but verbalize some discomfort to bilateral leg and medicated, assisted with repositioning for comfort and monitored

## 2020-07-26 NOTE — Progress Notes (Signed)
Inpatient Rehabilitation Center Individual Statement of Services  Patient Name:  Mitchell Lambert  Date:  07/26/2020  Welcome to the [facility].  Our goal is to provide you with an individualized program based on your diagnosis and situation, designed to meet your specific needs.  With this comprehensive rehabilitation program, you will be expected to participate in at least 3 hours of rehabilitation therapies Monday-Friday, with modified therapy programming on the weekends.  Your rehabilitation program will include the following services:  Physical Therapy (PT), Occupational Therapy (OT), Speech Therapy (ST), 24 hour per day rehabilitation nursing, Therapeutic Recreaction (TR), Neuropsychology, Care Coordinator, Rehabilitation Medicine, Nutrition Services, Pharmacy Services and Other  Weekly team conferences will be held on Wednesdays to discuss your progress.  Your Inpatient Rehabilitation Care Coordinator will talk with you frequently to get your input and to update you on team discussions.  Team conferences with you and your family in attendance may also be held.  Expected length of stay: 7-12 Days  Overall anticipated outcome: MOD I  Depending on your progress and recovery, your program may change. Your Inpatient Rehabilitation Care Coordinator will coordinate services and will keep you informed of any changes. Your Inpatient Rehabilitation Care Coordinator's name and contact numbers are listed  below.  The following services may also be recommended but are not provided by the [facility]:    [facility] will be made to provide these services after discharge if needed.  Arrangements include referral to agencies that provide these services.  Your insurance has been verified to be:  Medicare A & B Your primary doctor is:  Lawerance Cruel, MD  Pertinent information will  be shared with your doctor and your insurance company.  Inpatient Rehabilitation Care Coordinator:  Erlene Quan, [place] or [phone]  Information discussed with and copy given to patient by: Dyanne Iha, 07/26/2020, 11:58 AM

## 2020-07-26 NOTE — Plan of Care (Signed)
Problem: RH Balance Goal: LTG Patient will maintain dynamic standing with ADLs (OT) Description: LTG:  Patient will maintain dynamic standing balance with assist during activities of daily living (OT)  Flowsheets (Taken 07/26/2020 0939) LTG: Pt will maintain dynamic standing balance during ADLs with: Supervision/Verbal cueing   Problem: Sit to Stand Goal: LTG:  Patient will perform sit to stand in prep for activites of daily living with assistance level (OT) Description: LTG:  Patient will perform sit to stand in prep for activites of daily living with assistance level (OT) Flowsheets (Taken 07/26/2020 0939) LTG: PT will perform sit to stand in prep for activites of daily living with assistance level: Independent with assistive device   Problem: RH Eating Goal: LTG Patient will perform eating w/assist, cues/equip (OT) Description: LTG: Patient will perform eating with assist, with/without cues using equipment (OT) Flowsheets (Taken 07/26/2020 0939) LTG: Pt will perform eating with assistance level of: Independent with assistive device    Problem: RH Grooming Goal: LTG Patient will perform grooming w/assist,cues/equip (OT) Description: LTG: Patient will perform grooming with assist, with/without cues using equipment (OT) Flowsheets (Taken 07/26/2020 0939) LTG: Pt will perform grooming with assistance level of: Independent with assistive device    Problem: RH Bathing Goal: LTG Patient will bathe all body parts with assist levels (OT) Description: LTG: Patient will bathe all body parts with assist levels (OT) Flowsheets (Taken 07/26/2020 0939) LTG: Pt will perform bathing with assistance level/cueing: Supervision/Verbal cueing   Problem: RH Dressing Goal: LTG Patient will perform upper body dressing (OT) Description: LTG Patient will perform upper body dressing with assist, with/without cues (OT). Flowsheets (Taken 07/26/2020 0939) LTG: Pt will perform upper body dressing with assistance  level of: Supervision/Verbal cueing Goal: LTG Patient will perform lower body dressing w/assist (OT) Description: LTG: Patient will perform lower body dressing with assist, with/without cues in positioning using equipment (OT) Flowsheets (Taken 07/26/2020 0939) LTG: Pt will perform lower body dressing with assistance level of: Supervision/Verbal cueing Note: Not including shoes/socks.    Problem: RH Toileting Goal: LTG Patient will perform toileting task (3/3 steps) with assistance level (OT) Description: LTG: Patient will perform toileting task (3/3 steps) with assistance level (OT)  Flowsheets (Taken 07/26/2020 0939) LTG: Pt will perform toileting task (3/3 steps) with assistance level: Supervision/Verbal cueing   Problem: RH Functional Use of Upper Extremity Goal: LTG Patient will use RT/LT upper extremity as a (OT) Description: LTG: Patient will use right/left upper extremity as a stabilizer/gross assist/diminished/nondominant/dominant level with assist, with/without cues during functional activity (OT) Flowsheets (Taken 07/26/2020 0939) LTG: Use of upper extremity in functional activities: RUE as gross assist level LTG: Pt will use upper extremity in functional activity with assistance level of: Moderate Assistance - Patient 50 - 74%   Problem: RH Simple Meal Prep Goal: LTG Patient will perform simple meal prep w/assist (OT) Description: LTG: Patient will perform simple meal prep with assistance, with/without cues (OT). Flowsheets (Taken 07/26/2020 0939) LTG: Pt will perform simple meal prep with assistance level of: Supervision/Verbal cueing   Problem: RH Toilet Transfers Goal: LTG Patient will perform toilet transfers w/assist (OT) Description: LTG: Patient will perform toilet transfers with assist, with/without cues using equipment (OT) Flowsheets (Taken 07/26/2020 0939) LTG: Pt will perform toilet transfers with assistance level of: Supervision/Verbal cueing   Problem: RH  Tub/Shower Transfers Goal: LTG Patient will perform tub/shower transfers w/assist (OT) Description: LTG: Patient will perform tub/shower transfers with assist, with/without cues using equipment (OT) Flowsheets (Taken 07/26/2020 0939) LTG: Pt will  perform tub/shower stall transfers with assistance level of: Supervision/Verbal cueing

## 2020-07-26 NOTE — Progress Notes (Signed)
Inpatient Rehabilitation  Patient information reviewed and entered into eRehab system by Norinne Jeane M. Safa Derner, M.A., CCC/SLP, PPS Coordinator.  Information including medical coding, functional ability and quality indicators will be reviewed and updated through discharge.    

## 2020-07-26 NOTE — Evaluation (Signed)
Physical Therapy Assessment and Plan  Patient Details  Name: Mitchell Lambert MRN: [id number] Date of Birth: [birth year]  PT Diagnosis: Abnormal posture, Abnormality of gait, Coordination disorder, Hemiplegia dominant and Muscle weakness Rehab Potential: Excellent ELOS: 12-16 days   Today's Date: 07/26/2020 PT Individual Time: 1050-1202 PT Individual Time Calculation (min): 72 min    Hospital Problem: Principal Problem:   Left pontine stroke [facility]) Active Problems:   Acute right hemiparesis ([place])   Past Medical History:  Past Medical History:  Diagnosis Date  . Angina    NO ANGINA SINCE CABG  . Arthritis    right ankle  . Chronic daily headache    "@ least every other day""improved since heart surgery"  . Coronary artery disease    PT STATES HEART DOING WELL SINCE CABG SURGERY - DR. TILLEY IS HIS CARDIOLOGIST  . Diabetes mellitus    Lantus x 10 yrs  . Gout   . History of kidney stones 09-08-12   multiple times with some lithotripsies  . Hyperlipemia   . Hypertension    Past Surgical History:  Past Surgical History:  Procedure Laterality Date  . BACK SURGERY     microsurgery: spinal stenosis -lumbar  . bone spur  1990's   right great toe; "probably related to gout"  . BUBBLE STUDY  06/17/2020   Procedure: BUBBLE STUDY;  Surgeon: Lelon Perla, MD;  Location: [place];  Service: Cardiovascular;;  . CARDIAC CATHETERIZATION  04/27/11  . CATARACT EXTRACTION W/ INTRAOCULAR LENS IMPLANT  1990's   right; "lens was replaced twice over 3 months; lens is actually over iris"  . CORONARY ARTERY BYPASS GRAFT  04/30/2011   Procedure: CORONARY ARTERY BYPASS GRAFTING (CABG);  Surgeon: Melrose Nakayama, MD;  Location: [place];  Service: Open Heart Surgery;  Laterality: N/A;,x4 vessels  . CYSTOSCOPY W/ URETERAL STENT PLACEMENT Right 08/19/2012   Procedure: CYSTOSCOPY WITH RETROGRADE PYELOGRAM/URETERAL STENT PLACEMENT;  Surgeon: Molli Hazard, MD;  Location: [facility];   Service: Urology;  Laterality: Right;  . CYSTOSCOPY WITH RETROGRADE PYELOGRAM, URETEROSCOPY AND STENT PLACEMENT Left 07/17/2013   Procedure: CYSTOSCOPY, LEFT URETEROSCOPY, BASKET EXTRACTION OF STONE, INSERTION OF LEFT URETERAL STENT, ;  Surgeon: Jorja Loa, MD;  Location: [facility];  Service: Urology;  Laterality: Left;  . CYSTOSCOPY/RETROGRADE/URETEROSCOPY Right 09/12/2012   Procedure: CYSTOSCOPY/RETROGRADE/URETEROSCOPY;  Surgeon: Franchot Gallo, MD;  Location: [facility];  Service: Urology;  Laterality: Right;  . HOLMIUM LASER APPLICATION Right 07/08/3265   Procedure: HOLMIUM LASER APPLICATION;  Surgeon: Franchot Gallo, MD;  Location: [facility];  Service: Urology;  Laterality: Right;  . HOLMIUM LASER APPLICATION Left 03/26/5807   Procedure: HOLMIUM LASER APPLICATION;  Surgeon: Jorja Loa, MD;  Location: [facility];  Service: Urology;  Laterality: Left;  . KIDNEY STONE SURGERY     "probably 3 times"  . LEFT HEART CATHETERIZATION WITH CORONARY ANGIOGRAM N/A 04/28/2011   Procedure: LEFT HEART CATHETERIZATION WITH CORONARY ANGIOGRAM;  Surgeon: Jacolyn Reedy, MD;  Location: [facility] C[facility];  Service: Cardiovascular;  Laterality: N/A;  . LITHOTRIPSY     "many; probably 5 times"  . RADIAL ARTERY HARVEST  04/30/2011   Procedure: RADIAL ARTERY HARVEST;  Surgeon: Melrose Nakayama, MD;  Location: [place];  Service: Open Heart Surgery;  Laterality: Left;  . RETINAL DETACHMENT SURGERY  1990's   right eye; "made me have an early cataract"  . TEE WITHOUT CARDIOVERSION N/A 06/17/2020   Procedure: TRANSESOPHAGEAL ECHOCARDIOGRAM (TEE);  Surgeon: Lelon Perla, MD;  Location:  [facility];  Service: Cardiovascular;  Laterality: N/A;  . VASECTOMY      Assessment & Plan Clinical Impression: Patient is a 70 year old male with history of CKD II, CAD, T2DM,multiple renal calculi (Dr. Dhalsted)CVA 3/2022with transient right vision changes and aphasia and placed on Plavix.Hewas admitted on 07/21/2020  with left facial droop,dizziness with nausea and double vision. UDS negative. CT head negative and MRI brain was done revealing small acute infarct in left pontine medullary junction and left cerebral white matter as well as small remote left parietal infarct. He reported symptoms started the day before and was out of window for tPA. Patient had recent TEE done 04/2022that showed EF of 55 to 60% andnegative bubble study therefore was not got repeated and loop recorder was placed. Carotid Dopplers were negative for significant ICA stenosis.   Dr. Leonie Man expressed concerns of cardioembolic stroke and recommended DAPT of ASA and Brilinta x4 weeks followed by ASA alone,risks factormodificationandinterrogation of loop recordertorule out A. Fib.He has had issues with bradycardia and bisoprolol was discontinued. BP meds on hold to avoid hypoperfusion. Therapy initiated and patient noted to have right lower extremity ataxia with balance deficits as well as tendency for leg to buckle,mild cognitive deficits with delayed recall and processing, diplopia as well as right upper extremity ataxia with sensory deficits affecting ADLs and mobility.  Patient transferred to CIR on 07/25/2020 .   Patient currently requires mod with mobility secondary to muscle weakness, muscle joint tightness and muscle paralysis, decreased cardiorespiratoy endurance, abnormal tone, unbalanced muscle activation and decreased coordination, decreased attention to right and decreased sitting balance, decreased standing balance, decreased postural control, hemiplegia and difficulty maintaining precautions.  Prior to hospitalization, patient was independent  with mobility and lived with Spouse,Family in a House (town house) home.  Home access is 2+4Stairs to enter.  Patient will benefit from skilled PT intervention to maximize safe functional mobility, minimize fall risk and decrease caregiver burden for planned discharge home with  24 hour supervision.  Anticipate patient will benefit from follow up [place] at discharge.  PT - End of Session Activity Tolerance: Tolerates 10 - 20 min activity with multiple rests PT Assessment Rehab Potential (ACUTE/IP ONLY): Excellent PT Barriers to Discharge: Inaccessible home environment;Home environment access/layout PT Patient demonstrates impairments in the following area(s): Balance;Edema;Endurance;Motor;Perception;Safety PT Transfers Functional Problem(s): Bed Mobility;Bed to Chair;Car;Furniture;Floor PT Locomotion Functional Problem(s): Wheelchair Mobility;Stairs;Ambulation PT Plan PT Intensity: Minimum of 1-2 x/day ,45 to 90 minutes PT Frequency: 5 out of 7 days PT Duration Estimated Length of Stay: 12-16 days PT Treatment/Interventions: Ambulation/gait training;Balance/vestibular training;Cognitive remediation/compensation;Community reintegration;Discharge planning;Disease management/prevention;DME/adaptive equipment instruction;Functional electrical stimulation;Functional mobility training;Neuromuscular re-education;Patient/family education;Pain management;Stair training;Psychosocial support;Skin care/wound management;Splinting/orthotics;Therapeutic Activities;Therapeutic Exercise;Visual/perceptual remediation/compensation;UE/LE Coordination activities;UE/LE Strength taining/ROM;Wheelchair propulsion/positioning PT Transfers Anticipated Outcome(s): Supervision assist with LRAD PT Locomotion Anticipated Outcome(s): Ambulatory with LRAD at supervision assist level PT Recommendation Recommendations for Other Services: Therapeutic Recreation consult Follow Up Recommendations: Home health PT Patient destination: Home Equipment Recommended: To be determined   PT Evaluation Precautions/Restrictions   General   Vital Signs Pain Pain Assessment Pain Scale: 0-10 Pain Score: 10-Worst pain ever Pain Type: Acute pain Pain Location: Leg Pain Orientation: Right Pain Descriptors /  Indicators: Aching;Spasm;Cramping Pain Onset: Awakened from sleep Home Living/Prior Functioning Home Living Available Help at Discharge: Family;Available 24 hours/day Type of Home: House (town house) Home Access: Stairs to enter Technical brewer of Steps: 2+4 Entrance Stairs-Rails: None Home Layout: Two level;1/2 bath on main level Alternate Level Stairs-Number of Steps: 12 Alternate Level  Stairs-Rails: Left Bathroom Shower/Tub: Chiropodist: Standard Bathroom Accessibility: Yes  Lives With: Spouse;Family Prior Function Level of Independence: Independent with basic ADLs  Able to Take Stairs?: Yes Driving: Yes Vocation: Full time employment 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 Vision/Perception  Vision - [place] Alignment: Impaired (comment) Ocular Range of Motion: Restricted on the left Alignment/Gaze Preference: Within Defined Limits Tracking/Visual Pursuits: Decreased smoothness of horizontal tracking;Decreased smoothness of vertical tracking;Left eye does not track laterally Saccades: Additional head turns occurred during testing;Decreased speed of saccadic movement Convergence: Impaired (comment) Diplopia Assessment: Disappears with one eye closed;Objects split side to side;Only with left gaze Perception Perception: Impaired Inattention/Neglect: Does not attend to right side of body (mild R inattention) Praxis Praxis: Impaired Praxis Impairment Details: Motor planning  Cognition   Sensation Sensation Light Touch: Appears Intact Proprioception: Appears Intact Coordination Gross Motor Movements are Fluid and Coordinated: No Fine Motor Movements are Fluid and Coordinated: No Coordination and Movement Description: R sided hemi. mild dysmetria LE>UE Finger Nose Finger Test: decreased speed with mild dysmetria Heel Shin Test: dysmetria on the RLE. Motor  Motor Motor: Hemiplegia Motor - Skilled Clinical  Observations: R sided hemiplegia   Trunk/Postural Assessment  Cervical Assessment Cervical Assessment: Within Functional Limits Thoracic Assessment Thoracic Assessment: Exceptions to [facility] (mild rounding of shoulders) Lumbar Assessment Lumbar Assessment: Exceptions to [facility] Postural Control Postural Control: Deficits on evaluation Postural Limitations: mild deficits on the R  Balance Balance Balance Assessed: Yes Standardized Balance Assessment Standardized Balance Assessment: Berg Balance Test Berg Balance Test Sit to Stand: Able to stand  independently using hands Standing Unsupported: Able to stand 2 minutes with supervision Sitting with Back Unsupported but Feet Supported on Floor or Stool: Able to sit safely and securely 2 minutes Stand to Sit: Controls descent by using hands Transfers: Needs one person to assist Standing Unsupported with Eyes Closed: Able to stand 10 seconds with supervision Standing Ubsupported with Feet Together: Needs help to attain position but able to stand for 30 seconds with feet together From Standing, Reach Forward with Outstretched Arm: Can reach forward >5 cm safely (2") From Standing Position, Pick up Object from Floor: Unable to try/needs assist to keep balance From Standing Position, Turn to Look Behind Over each Shoulder: Needs supervision when turning Turn 360 Degrees: Needs assistance while turning Standing Unsupported, Alternately Place Feet on Step/Stool: Needs assistance to keep from falling or unable to try Standing Unsupported, One Foot in Front: Needs help to step but can hold 15 seconds Standing on One Leg: Unable to try or needs assist to prevent fall Total Score: 22 Static Sitting Balance Static Sitting - Balance Support: No upper extremity supported Static Sitting - Level of Assistance: 6: Modified independent (Device/Increase time) Dynamic Sitting Balance Dynamic Sitting - Balance Support: No upper extremity supported Dynamic  Sitting - Level of Assistance: 5: Stand by assistance Static Standing Balance Static Standing - Balance Support: No upper extremity supported Static Standing - Level of Assistance: 4: Min assist Dynamic Standing Balance Dynamic Standing - Balance Support: No upper extremity supported Dynamic Standing - Level of Assistance: 3: Mod assist Extremity Assessment      RLE Assessment RLE Assessment: Exceptions to [facility] RLE Strength Right Hip Flexion: 3+/5 Right Hip Extension: 3+/5 Right Hip ABduction: 4-/5 Right Hip ADduction: 4/5 Right Knee Flexion: 2-/5 Right Knee Extension: 4-/5 Right Ankle Dorsiflexion: 1/5 Right Ankle Plantar Flexion: 2+/5 LLE Assessment LLE Assessment: Within Functional Limits General Strength Comments: grossly 5/5  proximal to distal  Care Tool Care Tool Bed Mobility Roll left and right activity   Roll left and right assist level: Contact Guard/Touching assist    Sit to lying activity   Sit to lying assist level: Minimal Assistance - Patient > 75%    Lying to sitting edge of bed activity   Lying to sitting edge of bed assist level: Minimal Assistance - Patient > 75%     Care Tool Transfers Sit to stand transfer   Sit to stand assist level: Minimal Assistance - Patient > 75%    Chair/bed transfer   Chair/bed transfer assist level: Moderate Assistance - Patient 50 - 74%     Toilet transfer   Assist Level: Minimal Assistance - Patient > 75%    Car transfer   Car transfer assist level: Moderate Assistance - Patient 50 - 74%      Care Tool Locomotion Ambulation   Assist level: Moderate Assistance - Patient 50 - 74% Assistive device: No Device Max distance: 50  Walk 10 feet activity   Assist level: Moderate Assistance - Patient - 50 - 74% Assistive device: No Device   Walk 50 feet with 2 turns activity   Assist level: Moderate Assistance - Patient - 50 - 74%    Walk 150 feet activity Walk 150 feet activity did not occur: Safety/medical concerns       Walk 10 feet on uneven surfaces activity Walk 10 feet on uneven surfaces activity did not occur: Safety/medical concerns      Stairs Stair activity did not occur: Safety/medical concerns        Walk up/down 1 step activity Walk up/down 1 step or curb (drop down) activity did not occur: Safety/medical concerns     Walk up/down 4 steps activity did not occuR: Safety/medical concerns  Walk up/down 4 steps activity      Walk up/down 12 steps activity Walk up/down 12 steps activity did not occur: Safety/medical concerns      Pick up small objects from floor Pick up small object from the floor (from standing position) activity did not occur: Safety/medical concerns      Wheelchair   Type of Wheelchair: Manual   Wheelchair assist level: Minimal Assistance - Patient > 75% Max wheelchair distance: 150  Wheel 50 feet with 2 turns activity   Assist Level: Minimal Assistance - Patient > 75%  Wheel 150 feet activity   Assist Level: Minimal Assistance - Patient > 75%    Refer to Care Plan for Long Term Goals  SHORT TERM GOAL WEEK 1 PT Short Term Goal 1 (Week 1): Pt will perform bed mobility with supervision assist consisently PT Short Term Goal 2 (Week 1): Pt will Transfer to [facility] with min assist PT Short Term Goal 3 (Week 1): Pt will ambulate 157f with min assist and LRAD PT Short Term Goal 4 (Week 1): pt will propell WC >1597fwith supervision assist  Recommendations for other services: Therapeutic Recreation  Stress management and Outing/community reintegration  Skilled Therapeutic Intervention  Pt received sitting EOB and agreeable to PT. PT instructed patient in PT Evaluation and initiated treatment intervention; see above for results. PT educated patient in [place]hab potential, rehab goals, and discharge recommendations along with recommendation for follow-up rehabilitation services. Pt performed stand pivot transfer without AD and mod assist as listed. [facility] no AD as listed above and then with AD stated below. Patient demonstrates increased fall  risk as noted by score of   22/56 on Berg Balance Scale.  (<36= high risk for falls, close to 100%; 37-45 significant >80%; 46-51 moderate >50%; 52-55 lower >25%). Car transfer with min-mod assist for safety and blocking the RLE. Patient returned to room and left sitting EOB WC with call bell in reach and all needs met.         Mobility Bed Mobility Bed Mobility: Rolling Right;Rolling Left;Supine to Sit;Sit to Supine Rolling Right: Contact Guard/Touching assist Rolling Left: Contact Guard/Touching assist Supine to Sit: Minimal Assistance - Patient > 75% Sit to Supine: Minimal Assistance - Patient > 75% Transfers Transfers: Sit to Stand;Stand Pivot Transfers Sit to Stand: Minimal Assistance - Patient > 75% Stand Pivot Transfers: Moderate Assistance - Patient 50 - 74% Stand Pivot Transfer Details: Manual facilitation for weight shifting;Manual facilitation for weight bearing;Manual facilitation for placement Transfer (Assistive device): None Locomotion  Gait Ambulation: Yes Gait Assistance: Minimal Assistance - Patient > 75% Gait Distance (Feet): 100 Feet Assistive device: Rolling walker Gait Gait: Yes Gait Pattern: Impaired Gait Pattern: Step-to pattern;Right genu recurvatum;Narrow base of support;Lateral trunk lean to right;Trunk flexed Stairs / Additional Locomotion Stairs: Yes Stairs Assistance: Moderate Assistance - Patient 50 - 74% Stair Management Technique: Two rails Number of Stairs: 4 Height of Stairs: 3 Wheelchair Mobility Wheelchair Mobility: Yes Wheelchair Assistance: Minimal assistance - Patient >75% Wheelchair Propulsion: Both upper extremities Wheelchair Parts Management: Needs assistance Distance: 150   Discharge Criteria: Patient will be discharged from PT if patient refuses treatment 3 consecutive times without medical reason, if treatment  goals not met, if there is a change in medical status, if patient makes no progress towards goals or if patient is discharged from hospital.  The above assessment, treatment plan, treatment alternatives and goals were discussed and mutually agreed upon: by patient  Lorie Phenix 07/26/2020, 12:03 PM

## 2020-07-26 NOTE — Progress Notes (Signed)
PROGRESS NOTE   Subjective/Complaints: Sleeping poorly at night and experiencing foot cramps. Will change magnesium to closer to home dose (750mg  gluconate at night)- was on 800 at home but we do not carry.   ROS: +insomnia   Objective:   No results found. Recent Labs    07/24/20 0313 07/26/20 0453  WBC 7.7 8.8  HGB 13.4 14.2  HCT 39.2 41.1  PLT 206 234   Recent Labs    07/25/20 0343 07/26/20 0453  NA 141 139  K 4.0 4.2  CL 109 108  CO2 26 24  GLUCOSE 89 151*  BUN 33* 36*  CREATININE 2.02* 1.94*  CALCIUM 8.9 9.2    Intake/Output Summary (Last 24 hours) at 07/26/2020 1519 Last data filed at 07/26/2020 0900 Gross per 24 hour  Intake 360 ml  Output 300 ml  Net 60 ml        Physical Exam: Vital Signs Blood pressure (!) 155/79, pulse 75, temperature 97.7 F (36.5 C), temperature source Oral, resp. rate 18, height 5\' 11"  (1.803 m), weight 105.9 kg, SpO2 100 %. Gen: no distress, normal appearing HEENT: oral mucosa pink and moist, NCAT Cardio: Reg rate Chest: normal effort, normal rate of breathing Abd: soft, non-distended Musculoskeletal:  Cervical back: Normal range of motion.  Comments: 2+ right pedal edema.  RUE- biceps 4-/5, triceps 4+/5, grip 4/5, FA 2-/5 LUE 5/5 in same muscles RLE- HF 3-/5, KE 3-/5, KF 3/5, DF 0-1/5; PF 2-/5 Skin: General: Skin is warmand dry.  Comments: IV R wrist- is OK- no skin breakdown seen Neurological:  Mental Status: He is alertand oriented to person, place, and time.  Comments: Right facial droop without dysarthria. Speech slow and measured. Left ptosis with diplopia to left field and split vision in left lower field. Right hemiplegia with decrease in fine motor coordination.  Facial sensation intact Light touch intact x 4 extremities Apraxic with RUE Word finding issues mild to moderate Psychiatric:  Mood and Affect: Moodnormal.   Behavior: Behaviornormal.     Assessment/Plan: 1. Functional deficits which require 3+ hours per day of interdisciplinary therapy in a comprehensive inpatient rehab setting.  Physiatrist is providing close team supervision and 24 hour management of active medical problems listed below.  Physiatrist and rehab team continue to assess barriers to discharge/monitor patient progress toward functional and medical goals  Care Tool:  Bathing    Body parts bathed by patient: Right arm,Chest,Abdomen,Front perineal area,Left upper leg,Face   Body parts bathed by helper: Left arm,Buttocks,Left lower leg,Right lower leg     Bathing assist Assist Level: Minimal Assistance - Patient > 75%     Upper Body Dressing/Undressing Upper body dressing   What is the patient wearing?: Pull over shirt    Upper body assist Assist Level: Minimal Assistance - Patient > 75%    Lower Body Dressing/Undressing Lower body dressing      What is the patient wearing?: Pants     Lower body assist Assist for lower body dressing: Minimal Assistance - Patient > 75%     Toileting Toileting    Toileting assist Assist for toileting: Minimal Assistance - Patient > 75%     Transfers  Chair/bed transfer  Transfers assist     Chair/bed transfer assist level: Moderate Assistance - Patient 50 - 74%     Locomotion Ambulation   Ambulation assist      Assist level: Moderate Assistance - Patient 50 - 74% Assistive device: No Device Max distance: 50   Walk 10 feet activity   Assist     Assist level: Moderate Assistance - Patient - 50 - 74% Assistive device: No Device   Walk 50 feet activity   Assist    Assist level: Moderate Assistance - Patient - 50 - 74% Assistive device: Walker-standard    Walk 150 feet activity   Assist Walk 150 feet activity did not occur: Safety/medical concerns    Assistive device: Walker-standard    Walk 10 feet on uneven surface   activity   Assist Walk 10 feet on uneven surfaces activity did not occur: Safety/medical concerns         Wheelchair     Assist   Type of Wheelchair: Manual    Wheelchair assist level: Minimal Assistance - Patient > 75% Max wheelchair distance: 150    Wheelchair 50 feet with 2 turns activity    Assist        Assist Level: Minimal Assistance - Patient > 75%   Wheelchair 150 feet activity     Assist      Assist Level: Minimal Assistance - Patient > 75%   Blood pressure (!) 155/79, pulse 75, temperature 97.7 F (36.5 C), temperature source Oral, resp. rate 18, height 5\' 11"  (1.803 m), weight 105.9 kg, SpO2 100 %.    Medical Problem List and Plan: 1.R hemiparesis with R foot drop, aphasia and mild apraxiasecondary to L pontine stroke -patient may shower -ELOS/Goals: 7-12 days- mod I- might need longer  -Continue CIR 2. Antithrombotics: -DVT/anticoagulation:Pharmaceutical:Lovenox -antiplatelet therapy:  ASA/Brilinta x4 weeks followed by ASA alone. 3. Pain: ContinueTramadol as needed 4. Mood:LCSW to follow for evaluation and support -antipsychotic agents: N/AA 5. Neuropsych: This patientiscapable of making decisions on hisown behalf. 6. Skin/Wound Care:Routine pressure-relief measures 7. Fluids/Electrolytes/Nutrition:Monitor I's/O. Check electrolytes in a.m. 8.HTN: Monitor blood pressures twice daily.Avoid hypotension to avoid hypoperfusion. Continue Amlodipine and irbesartan have been on hold.  9.T2DM: Hgb A1C- 8.8. Poorly controlled.  --Monitor blood sugarsac/hsand useSSIfor elevated blood sugars. -- Continue insulin glargine 100 unitsas per home regimen.  10.CKD IIIb: BaselineBUN/SCR 31/1.77-->33/2.02. Encourage fluid intake. --Patient educated on avoiding NSAIDs. -- Recheck labs in am and monitor renal status  with serial checks. 11. History of gout: Continue allopurinol. Monitor for flares.  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 Continue ASA, fish oil and Lipitor. --Offbisoprolol due to bradycardia. 13. Low protein stores: Will add prosource and Ensure Max for supplement.  14. Chronic hypomagnesemia: Changed magnesium to 750mg  HS gluconate (closer to home dose) 15. R foot drop- order PRAFO for RLE- might also help muscle cramping in R foot   LOS: 1 days A FACE TO FACE EVALUATION WAS PERFORMED  Clide Deutscher Argie Lober 07/26/2020, 3:19 PM

## 2020-07-26 NOTE — Evaluation (Addendum)
Occupational Therapy Assessment and Plan  Patient Details  Name: Mitchell Lambert MRN: [id number] Date of Birth: 02-16-1951  OT Diagnosis: cognitive deficits, hemiplegia affecting dominant side, muscle weakness (generalized) and visual disturbance, decreased dynamic standing balance, gross/fine motor coordination of BUE/BLE impairing ADL/func mobility performance Rehab Potential: Rehab Potential (ACUTE ONLY): Good ELOS: 12 to 14 days   Today's Date: 07/26/2020 OT Individual Time: 3710-6269 OT Individual Time Calculation (min): 58 min   + 46 min  Hospital Problem: Principal Problem:   Left pontine stroke ([place]) Active Problems:   Acute right hemiparesis (H[facility])   Past Medical History:  Past Medical History:  Diagnosis Date  . Angina    NO ANGINA SINCE CABG  . Arthritis    right ankle  . Chronic daily headache    "@ least every other day""improved since heart surgery"  . Coronary artery disease    PT STATES HEART DOING WELL SINCE CABG SURGERY - DR. TILLEY IS HIS CARDIOLOGIST  . Diabetes mellitus    Lantus x 10 yrs  . Gout   . History of kidney stones 09-08-12   multiple times with some lithotripsies  . Hyperlipemia   . Hypertension    Past Surgical History:  Past Surgical History:  Procedure Laterality Date  . BACK SURGERY     microsurgery: spinal stenosis -lumbar  . bone spur  1990's   right great toe; "probably related to gout"  . BUBBLE STUDY  06/17/2020   Procedure: BUBBLE STUDY;  Surgeon: Lelon Perla, MD;  Location: [place];  Service: Cardiovascular;;  . CARDIAC CATHETERIZATION  04/27/11  . CATARACT EXTRACTION W/ INTRAOCULAR LENS IMPLANT  1990's   right; "lens was replaced twice over 3 months; lens is actually over iris"  . CORONARY ARTERY BYPASS GRAFT  04/30/2011   Procedure: CORONARY ARTERY BYPASS GRAFTING (CABG);  Surgeon: Melrose Nakayama, MD;  Location: [place];  Service: Open Heart Surgery;  Laterality: N/A;,x4 vessels  . CYSTOSCOPY W/ URETERAL STENT  PLACEMENT Right 08/19/2012   Procedure: CYSTOSCOPY WITH RETROGRADE PYELOGRAM/URETERAL STENT PLACEMENT;  Surgeon: Molli Hazard, MD;  Location: [facility];  Service: Urology;  Laterality: Right;  . CYSTOSCOPY WITH RETROGRADE PYELOGRAM, URETEROSCOPY AND STENT PLACEMENT Left 07/17/2013   Procedure: CYSTOSCOPY, LEFT URETEROSCOPY, BASKET EXTRACTION OF STONE, INSERTION OF LEFT URETERAL STENT, ;  Surgeon: Jorja Loa, MD;  Location: [facility];  Service: Urology;  Laterality: Left;  . CYSTOSCOPY/RETROGRADE/URETEROSCOPY Right 09/12/2012   Procedure: CYSTOSCOPY/RETROGRADE/URETEROSCOPY;  Surgeon: Franchot Gallo, MD;  Location: [facility];  Service: Urology;  Laterality: Right;  . HOLMIUM LASER APPLICATION Right 4/85/4627   Procedure: HOLMIUM LASER APPLICATION;  Surgeon: Franchot Gallo, MD;  Location: [facility];  Service: Urology;  Laterality: Right;  . HOLMIUM LASER APPLICATION Left 0/35/0093   Procedure: HOLMIUM LASER APPLICATION;  Surgeon: Jorja Loa, MD;  Location: [facility];  Service: Urology;  Laterality: Left;  . KIDNEY STONE SURGERY     "probably 3 times"  . LEFT HEART CATHETERIZATION WITH CORONARY ANGIOGRAM N/A 04/28/2011   Procedure: LEFT HEART CATHETERIZATION WITH CORONARY ANGIOGRAM;  Surgeon: Jacolyn Reedy, MD;  Location: [facility];  Service: Cardiovascular;  Laterality: N/A;  . LITHOTRIPSY     "many; probably 5 times"  . RADIAL ARTERY HARVEST  04/30/2011   Procedure: RADIAL ARTERY HARVEST;  Surgeon: Melrose Nakayama, MD;  Location: [place];  Service: Open Heart Surgery;  Laterality: Left;  . RETINAL DETACHMENT SURGERY  1990's   right eye; "made me have an early  cataract"  . TEE WITHOUT CARDIOVERSION N/A 06/17/2020   Procedure: TRANSESOPHAGEAL ECHOCARDIOGRAM (TEE);  Surgeon: Lelon Perla, MD;  Location: [facility] ENDOSCOPY;  Service: Cardiovascular;  Laterality: N/A;  . VASECTOMY      Assessment & Plan Clinical Impression: Patient is a 70 y.o. year old male  with history of CKD  II, CAD, T2DM,multiple renal calculi (Dr. Dhalsted)CVA 3/2022with transient right vision changes and aphasia and placed on Plavix.Hewas admitted on 07/21/2020 with left facial droop,dizziness with nausea and double vision. UDS negative. CT head negative and MRI brain was done revealing small acute infarct in left pontine medullary junction and left cerebral white matter as well as small remote left parietal infarct. He reported symptoms started the day before and was out of window for tPA. Patient had recent TEE done 04/2022that showed EF of 55 to 60% andnegative bubble study therefore was not got repeated and loop recorder was placed. Carotid Dopplers were negative for significant ICA stenosis.   Dr. Leonie Man expressed concerns of cardioembolic stroke and recommended DAPT of ASA and Brilinta x4 weeks followed by ASA alone,risks factormodificationandinterrogation of loop recordertorule out A. Fib.He has had issues with bradycardia and bisoprolol was discontinued. BP meds on hold to avoid hypoperfusion. Therapy initiated and patient noted to have right lower extremity ataxia with balance deficits as well as tendency for leg to buckle,mild cognitive deficits with delayed recall and processing, diplopia as well as right upper extremity ataxia with sensory deficits affecting ADLs and mobility. CIR was recommended due to functional decline.  Patient transferred to CIR on 07/25/2020 .    Patient currently requires min with basic self-care skills secondary to muscle weakness, decreased cardiorespiratoy endurance, impaired timing and sequencing, abnormal tone, unbalanced muscle activation, motor apraxia, decreased coordination and decreased motor planning, decreased visual perceptual skills and decreased visual motor skills, decreased attention to right, decreased attention and delayed processing and decreased standing balance and hemiplegia.  Prior to hospitalization, patient could complete  BADL/IADL with independent .  Patient will benefit from skilled intervention to increase independence with basic self-care skills and increase level of independence with iADL prior to discharge home with care partner.  Anticipate patient will require intermittent supervision and follow up outpatient.  OT - End of Session Activity Tolerance: Tolerates 10 - 20 min activity with multiple rests Endurance Deficit: Yes Endurance Deficit Description: req extended time to complete ADL, reports fatigue after BADL OT Assessment Rehab Potential (ACUTE ONLY): Good OT Patient demonstrates impairments in the following area(s): Balance;Endurance;Safety;Perception;Pain;Motor;Skin Integrity;Sensory;Vision OT Basic ADL's Functional Problem(s): Eating;Grooming;Bathing;Dressing;Toileting OT Advanced ADL's Functional Problem(s): Simple Meal Preparation OT Transfers Functional Problem(s): Toilet;Tub/Shower OT Additional Impairment(s): Fuctional Use of Upper Extremity OT Plan OT Intensity: Minimum of 1-2 x/day, 45 to 90 minutes OT Frequency: 5 out of 7 days OT Duration/Estimated Length of Stay: 12 to 14 days OT Treatment/Interventions: Balance/vestibular training;Community reintegration;Disease mangement/prevention;Functional electrical stimulation;Neuromuscular re-education;Patient/family education;Self Care/advanced ADL retraining;Splinting/orthotics;Therapeutic Exercise;UE/LE Coordination activities;Wheelchair propulsion/positioning;UE/LE Strength taining/ROM;Visual/perceptual remediation/compensation;Therapeutic Activities;Skin care/wound managment;Psychosocial support;Pain management;Functional mobility training;DME/adaptive equipment instruction;Discharge planning;Cognitive remediation/compensation OT Self Feeding Anticipated Outcome(s): mod I OT Basic Self-Care Anticipated Outcome(s): S OT Toileting Anticipated Outcome(s): S OT Bathroom Transfers Anticipated Outcome(s): S OT Recommendation Recommendations  for Other Services: Therapeutic Recreation consult Therapeutic Recreation Interventions: Outing/community reintergration;Pet therapy;Stress management;Kitchen group Patient destination: Home Follow Up Recommendations: Outpatient OT Equipment Recommended: To be determined   OT Evaluation Precautions/Restrictions  Precautions Precautions: Fall Precaution Comments: diplopia, falls, R hemi Restrictions Weight Bearing Restrictions: No General Chart Reviewed: Yes Family/Caregiver Present: No Pain Pain  Assessment Pain Scale: Faces Faces Pain Scale: Hurts even more Pain Type: Acute pain Pain Location: Leg Pain Orientation: Right Home Living/Prior Functioning Home Living Available Help at Discharge: Family,Available PRN/intermittently (wife works during the day, may be able to arrange to be home at first) Type of Home: House (town house) Home Access: Stairs to enter Technical brewer of Steps: 2+4 Entrance Stairs-Rails: None Home Layout: Two level,1/2 bath on main level Alternate Level Stairs-Number of Steps: 12 Alternate Level Stairs-Rails: Left Bathroom Shower/Tub: Optometrist: Yes  Lives With: Spouse,Family IADL History Homemaking Responsibilities: Yes Current License: Yes Education: high school Occupation: Full time employment 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[employer], Tour manager in a band Prior Function Level of Independence: Independent with basic ADLs,Independent with gait,Independent with homemaking with ambulation  Able to Take Stairs?: Yes Driving: Yes Vocation: Full time employment Comments: 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 Vision Baseline Vision/History: Wears glasses (hx of R detatched retina with lens replacement, wears L contact) Wears Glasses: Reading only Patient Visual Report: Blurring of vision;Diplopia Vision Assessment?: Yes Eye Alignment: Impaired  (comment) Ocular Range of Motion: Restricted on the left Alignment/Gaze Preference: Within Defined Limits Tracking/Visual Pursuits: Decreased smoothness of horizontal tracking;Decreased smoothness of vertical tracking;Left eye does not track laterally Saccades: Additional head turns occurred during testing;Decreased speed of saccadic movement Convergence: Impaired (comment) Visual Fields: No apparent deficits Diplopia Assessment: Disappears with one eye closed;Objects split side to side;Only with left gaze Perception  Perception: Impaired Inattention/Neglect: Does not attend to right side of body (mild) Praxis Praxis: Impaired Praxis Impairment Details: Motor planning Cognition Overall Cognitive Status: Within Functional Limits for tasks assessed Arousal/Alertness: Awake/alert Orientation Level: Person;Place;Situation Person: Oriented Place: Oriented Situation: Oriented Year: 2022 Month: May Day of Week: Correct Immediate Memory Recall: Sock;Blue;Bed Memory Recall Sock: Without Cue Memory Recall Blue: Without Cue Memory Recall Bed: Without Cue Safety/Judgment: Impaired (some impulsiveness during functional transfers, attempting to move before w/c locked) Sensation Sensation Light Touch: Appears Intact Hot/Cold: Appears Intact Proprioception: Impaired by gross assessment Stereognosis: Impaired by gross assessment Additional Comments: impaired RUE, LT intact at digits Coordination Gross Motor Movements are Fluid and Coordinated: No Fine Motor Movements are Fluid and Coordinated: No Coordination and Movement Description: R sided hemi. mild dysmetria LE>UE Finger Nose Finger Test: decreased speed with mild dysmetria Heel Shin Test: dysmetria on the RLE. 9 Hole Peg Test: L: 35 secs, ; R: able to remove all pegs, only place 7 within 6 min before fatiguing Motor  Motor Motor: Hemiplegia Motor - Skilled Clinical Observations: R sided hemiplegia  Trunk/Postural Assessment   Cervical Assessment Cervical Assessment: Within Functional Limits Thoracic Assessment Thoracic Assessment: Exceptions to [facility] (mild rounding of shoulders) Lumbar Assessment Lumbar Assessment: Exceptions to [facility] Postural Control Postural Control: Deficits on evaluation Postural Limitations: mild deficits on the R  Balance Balance Balance Assessed: Yes Standardized Balance Assessment Standardized Balance Assessment: Berg Balance Test Berg Balance Test Sit to Stand: Able to stand  independently using hands Standing Unsupported: Able to stand 2 minutes with supervision Sitting with Back Unsupported but Feet Supported on Floor or Stool: Able to sit safely and securely 2 minutes Stand to Sit: Controls descent by using hands Transfers: Needs one person to assist Standing Unsupported with Eyes Closed: Able to stand 10 seconds with supervision Standing Ubsupported with Feet Together: Needs help to attain position but able to stand for 30 seconds with feet together From Standing, Reach Forward with Outstretched Arm: Can  reach forward >5 cm safely (2") From Standing Position, Pick up Object from Floor: Unable to try/needs assist to keep balance From Standing Position, Turn to Look Behind Over each Shoulder: Needs supervision when turning Turn 360 Degrees: Needs assistance while turning Standing Unsupported, Alternately Place Feet on Step/Stool: Needs assistance to keep from falling or unable to try Standing Unsupported, One Foot in Front: Needs help to step but can hold 15 seconds Standing on One Leg: Unable to try or needs assist to prevent fall Total Score: 22 Static Sitting Balance Static Sitting - Balance Support: No upper extremity supported Static Sitting - Level of Assistance: 6: Modified independent (Device/Increase time) Dynamic Sitting Balance Dynamic Sitting - Balance Support: No upper extremity supported Dynamic Sitting - Level of Assistance: 5: Stand by assistance Static  Standing Balance Static Standing - Balance Support: No upper extremity supported Static Standing - Level of Assistance: 4: Min assist Dynamic Standing Balance Dynamic Standing - Balance Support: No upper extremity supported Dynamic Standing - Level of Assistance: 3: Mod assist Extremity/Trunk Assessment RUE Assessment RUE Assessment: Exceptions to [facility] Active Range of Motion (AROM) Comments: ~130 degrees shoulder flexion General Strength Comments: 37 lbs grip strength, 3+/5 shoulder flexion RUE Body System: Neuro Brunstrum levels for arm and hand: Arm;Hand Brunstrum level for arm: Stage II Synergy is developing;Stage III Synergy is performed voluntarily Brunstrum level for hand: Stage II Synergy is developing LUE Assessment LUE Assessment: Within Functional Limits Active Range of Motion (AROM) Comments: 3/4 to full shoulder flexion General Strength Comments: 86 lb grip strength, 5/5 shoulder flexion  Care Tool Care Tool Self Care Eating   Eating Assist Level: Minimal Assistance - Patient > 75%    Oral Care    Oral Care Assist Level: Minimal Assistance - Patient > 75%    Bathing   Body parts bathed by patient: Right arm;Chest;Abdomen;Front perineal area;Left upper leg;Face Body parts bathed by helper: Left arm;Buttocks;Left lower leg;Right lower leg   Assist Level: Minimal Assistance - Patient > 75%    Upper Body Dressing(including orthotics)   What is the patient wearing?: Pull over shirt   Assist Level: Minimal Assistance - Patient > 75%    Lower Body Dressing (excluding footwear)   What is the patient wearing?: Pants Assist for lower body dressing: Minimal Assistance - Patient > 75%    Putting on/Taking off footwear   What is the patient wearing?: Non-skid slipper socks Assist for footwear: Total Assistance - Patient < 25%       Care Tool Toileting Toileting activity   Assist for toileting: Minimal Assistance - Patient > 75%     Care Tool Bed Mobility Roll left  and right activity   Roll left and right assist level: Contact Guard/Touching assist    Sit to lying activity   Sit to lying assist level: Minimal Assistance - Patient > 75%    Lying to sitting edge of bed activity   Lying to sitting edge of bed assist level: Minimal Assistance - Patient > 75%     Care Tool Transfers Sit to stand transfer   Sit to stand assist level: Minimal Assistance - Patient > 75%    Chair/bed transfer   Chair/bed transfer assist level: Minimal Assistance - Patient > 75%     Toilet transfer   Assist Level: Minimal Assistance - Patient > 75%     Care Tool Cognition Expression of Ideas and Wants Expression of Ideas and Wants: Some difficulty - exhibits some difficulty with expressing needs  and ideas (e.g, some words or finishing thoughts) or speech is not clear   Understanding Verbal and Non-Verbal Content Understanding Verbal and Non-Verbal Content: Usually understands - understands most conversations, but misses some part/intent of message. Requires cues at times to understand   Memory/Recall Ability *first 3 days only Memory/Recall Ability *first 3 days only: Current season;Location of own room;Staff names and faces;That he or she is in a hospital/hospital unit    Refer to Care Plan for [place] 1 OT Short Term Goal 1 (Week 1): Pt will don pants with use of AE PRN with CGA. OT Short Term Goal 2 (Week 1): Pt will complete toilet transfer with CGA + LRAD. OT Short Term Goal 3 (Week 1): Pt will manage RUE during functional mobility with min VCs. OT Short Term Goal 4 (Week 1): Pt will tolerate standing ADL for > 5 min with CGA.  Recommendations for other services: Therapeutic Recreation  Pet therapy, Kitchen group, Stress management and Outing/community reintegration   Skilled Therapeutic Intervention ADL ADL Eating: Minimal assistance Where Assessed-Eating: Wheelchair Grooming: Minimal assistance Where Assessed-Grooming:  Sitting at sink Upper Body Bathing: Minimal assistance Where Assessed-Upper Body Bathing: Wheelchair Lower Body Bathing: Minimal assistance Where Assessed-Lower Body Bathing: Edge of bed Upper Body Dressing: Minimal assistance Where Assessed-Upper Body Dressing: Edge of bed Lower Body Dressing: Minimal assistance Where Assessed-Lower Body Dressing: Edge of bed Toileting: Minimal assistance Where Assessed-Toileting: Glass blower/designer: Psychiatric nurse Method: Arts development officer: Energy manager: Not assessed Social research officer, government: Not assessed Mobility  Bed Mobility Bed Mobility: Rolling Right;Rolling Left;Supine to Sit;Sit to Supine Rolling Right: Contact Guard/Touching assist Rolling Left: Contact Guard/Touching assist Supine to Sit: Minimal Assistance - Patient > 75% Sit to Supine: Minimal Assistance - Patient > 75% Transfers Sit to Stand: Minimal Assistance - Patient > 75%  Session Note: Pt received semi-reclined in bed with LPN present, c/o RLE cramping, but agreeable to OT eval. Came to sitting EOB with CGA and use of bed rail. Assisted pt with RLE DF to facilitate calf stretch and relieve cramping. Reported moderate improvement in pain. Reviewed role of CIR OT, evaluation process, ADL/func mobility retraining, goals for therapy, and safety plan. Evaluation completed as documented above with session focus on toileting, functional mobility, LBD, and seated grooming. Pt req min A to thread RLE and for balance when donning pants. Stand-pivot with min A and no AD to and from w/c. Stood to void at toilet with min A for balance + use of B grab bar. Additionally, was able to insert his L contact with min A to open case and to stabilize RUE. C/o dizziness when turning and looking L, with increased disturbance of vision ("shifting" of the image.) Pt would like to return to drumming with his dominant hand.  Pt left semi-reclined in bed  with call bell in reach, bed alarm engaged, and all immediate needs met.    Session 2 Note: Pt received semi-reclined in bed, denies pain, agreeable to therapy. Squat-pivot transfer to w/c with CGA for balance. Stand-pivot to shower chair with min A for balance + use of grab bar. Bathed UB seated with min A to bathe LUE. Bathed LB seated with CGA when reaching towards feet. Doffed shirt close S, donned new shirt with min A to begin threading RUE + min VCs for hemi technique. Pt attempts to incorporate RUE into ADL with ind. Doffed underwear/shorts CGA + extended time. Donned  new underwear/shorts with min A to thread RLE and for balance during standing. Squat-pivot back to bed same manner as above. Pt set-up with lunch req A to open items, attempted to use dominant RUE to self-feed, but is only able to loosely grasp utensil and has to bend forward to reach his mouth. Opts to use LUE to self-feed. Edu re incorporating RUE into self-feeding for safety (utilizing BUE to drink from cup, having family let him attempt to open items himself first). Wife and pt verbalize understanding. Administered 9HPT and assessed B grip strength with results documented above in eval.  Pt left seated EOB with wife present, bed alarm engaged, call bell in reach, and all immediate needs met.    Discharge Criteria: Patient will be discharged from OT if patient refuses treatment 3 consecutive times without medical reason, if treatment goals not met, if there is a change in medical status, if patient makes no progress towards goals or if patient is discharged from hospital.  The above assessment, treatment plan, treatment alternatives and goals were discussed and mutually agreed upon: by patient  Volanda Napoleon MS, OTR/L  07/26/2020, 9:38 AM

## 2020-07-27 ENCOUNTER — Inpatient Hospital Stay (HOSPITAL_COMMUNITY): Payer: Medicare Other

## 2020-07-27 DIAGNOSIS — G8191 Hemiplegia, unspecified affecting right dominant side: Secondary | ICD-10-CM

## 2020-07-27 DIAGNOSIS — E1169 Type 2 diabetes mellitus with other specified complication: Secondary | ICD-10-CM

## 2020-07-27 DIAGNOSIS — E669 Obesity, unspecified: Secondary | ICD-10-CM

## 2020-07-27 LAB — GLUCOSE, CAPILLARY
Glucose-Capillary: 136 mg/dL — ABNORMAL HIGH (ref 70–99)
Glucose-Capillary: 136 mg/dL — ABNORMAL HIGH (ref 70–99)
Glucose-Capillary: 155 mg/dL — ABNORMAL HIGH (ref 70–99)
Glucose-Capillary: 105 mg/dL — ABNORMAL HIGH (ref 70–99)
Glucose-Capillary: 92 mg/dL (ref 70–99)

## 2020-07-27 MED ORDER — BACLOFEN 5 MG HALF TABLET
5.0000 mg | ORAL_TABLET | Freq: Three times a day (TID) | ORAL | Status: DC
  Administered 2020-07-27 – 2020-08-07 (×33): 5 mg via ORAL
  Filled 2020-07-27 (×33): qty 1

## 2020-07-27 MED ORDER — HYDROCODONE-ACETAMINOPHEN 5-325 MG PO TABS
1.0000 | ORAL_TABLET | Freq: Four times a day (QID) | ORAL | Status: DC | PRN
  Administered 2020-07-27 – 2020-08-06 (×10): 1 via ORAL
  Filled 2020-07-27 (×10): qty 1

## 2020-07-27 NOTE — Progress Notes (Addendum)
Notified Dr Naaman Plummer regarding patient c/o of increase pain to right ankle and foot, stating pain medication has not relieved his pain,rate pain 10/10 on pain scale, sensitive to touch with increase swelling noted to area,  currently refuse ice packunrelieve sensation. MD states he will place new  Orders. Monitor and assisted   2137 Patient informed of new orders

## 2020-07-27 NOTE — Progress Notes (Signed)
Patient states he was awaken from his sleep with aching pain to his ankle and increase cramping sensation after Ortho boot was applied approximately one 1/2 hours earlier.He further states he was struggling trying to not remove the boot but the "pain became to bad"p,. CN removed the boot upon request and informed Probation officer.Patient was found anxious and verbalizes discomfort and was assisted in sitting on side of bed.PRN Ativan administered and Tylenol 650 mg po.Provided reassurance and support, patient requested to sit up on bedside upon leaving his room, bed alarm on     0145 Patient ask for assistance to stand at bedside and ambulate due to cramping sensation right leg and discomfort in ankle. Assigned RN assisted patient up and he was unable to tolerate walking but 3-4 steps before returning to bedside,Patient was encouraged  Some stretching techniques but again f  0405 Increase pain in right leg and ankle medicated with Tramadol po, states fell like throbbing ,sharping  aching pain ' like ", rate pain 8/10 on ain scale my ankle is twisted'    0600 Assisted up to bedside to void, states discomfort is still there but better

## 2020-07-27 NOTE — Progress Notes (Addendum)
PROGRESS NOTE   Subjective/Complaints: Having cramps, spasms in RLE. PRAFO helped at first but eventually caused pain. Feels that right ankle is now swollen and is like an "ankle sprain". Impacts sleep  ROS: Patient denies fever, rash, sore throat, blurred vision, nausea, vomiting, diarrhea, cough, shortness of breath or chest pain, headache, or mood change.   Objective:   No results found. Recent Labs    07/26/20 0453  WBC 8.8  HGB 14.2  HCT 41.1  PLT 234   Recent Labs    07/25/20 0343 07/26/20 0453  NA 141 139  K 4.0 4.2  CL 109 108  CO2 26 24  GLUCOSE 89 151*  BUN 33* 36*  CREATININE 2.02* 1.94*  CALCIUM 8.9 9.2    Intake/Output Summary (Last 24 hours) at 07/27/2020 1019 Last data filed at 07/27/2020 0700 Gross per 24 hour  Intake 910 ml  Output 600 ml  Net 310 ml        Physical Exam: Vital Signs Blood pressure (!) 165/77, pulse (!) 59, temperature 97.9 F (36.6 C), resp. rate 16, height 5\' 11"  (1.803 m), weight 105.9 kg, SpO2 99 %. Constitutional: No distress . Vital signs reviewed. HEENT: EOMI, oral membranes moist Neck: supple Cardiovascular: RRR without murmur. No JVD    Respiratory/Chest: CTA Bilaterally without wheezes or rales. Normal effort    GI/Abdomen: BS +, non-tender, non-distended Ext: no clubbing, cyanosis, or edema Psych: pleasant and cooperative Musculoskeletal:  Cervical back: Normal range of motion.  Comments: 2+ right pedal edema ongoing  -right ankle tender to touch/ROM.  RUE- biceps 4-/5, triceps 4+/5, grip 4/5, FA 2-/5 LUE 5/5 in same muscles RLE- HF 3-/5, KE 3-/5, KF 3/5, DF 0-1/5; PF 2-/5 Skin: General: Skin is warmand dry.  Comments: IV R wrist- is OK- no skin breakdown seen Neurological:  Mental Status: He is alertand oriented to person, place, and time.    Right facial droop without dysarthria.  Left ptosis with diplopia to left field and  split vision in left lower field. Right hemiplegia with decrease in fine motor coordination.  RUE 4/5 prox to 2/5 distal. RLE 3/5 prox to 1-2/5 ADF/PF. Sensation intact Apraxic with RUE Language functional. Sl word finding deficits     Assessment/Plan: 1. Functional deficits which require 3+ hours per day of interdisciplinary therapy in a comprehensive inpatient rehab setting.  Physiatrist is providing close team supervision and 24 hour management of active medical problems listed below.  Physiatrist and rehab team continue to assess barriers to discharge/monitor patient progress toward functional and medical goals  Care Tool:  Bathing    Body parts bathed by patient: Right arm,Chest,Abdomen,Front perineal area,Left upper leg,Face   Body parts bathed by helper: Left arm,Buttocks,Left lower leg,Right lower leg     Bathing assist Assist Level: Minimal Assistance - Patient > 75%     Upper Body Dressing/Undressing Upper body dressing   What is the patient wearing?: Pull over shirt    Upper body assist Assist Level: Minimal Assistance - Patient > 75%    Lower Body Dressing/Undressing Lower body dressing      What is the patient wearing?: Pants     Lower body assist  Assist for lower body dressing: Minimal Assistance - Patient > 75%     Toileting Toileting    Toileting assist Assist for toileting: Minimal Assistance - Patient > 75%     Transfers Chair/bed transfer  Transfers assist     Chair/bed transfer assist level: Moderate Assistance - Patient 50 - 74%     Locomotion Ambulation   Ambulation assist      Assist level: Moderate Assistance - Patient 50 - 74% Assistive device: No Device Max distance: 50   Walk 10 feet activity   Assist     Assist level: Moderate Assistance - Patient - 50 - 74% Assistive device: No Device   Walk 50 feet activity   Assist    Assist level: Moderate Assistance - Patient - 50 - 74% Assistive device:  Walker-standard    Walk 150 feet activity   Assist Walk 150 feet activity did not occur: Safety/medical concerns    Assistive device: Walker-standard    Walk 10 feet on uneven surface  activity   Assist Walk 10 feet on uneven surfaces activity did not occur: Safety/medical concerns         Wheelchair     Assist   Type of Wheelchair: Manual    Wheelchair assist level: Minimal Assistance - Patient > 75% Max wheelchair distance: 150    Wheelchair 50 feet with 2 turns activity    Assist        Assist Level: Minimal Assistance - Patient > 75%   Wheelchair 150 feet activity     Assist      Assist Level: Minimal Assistance - Patient > 75%   Blood pressure (!) 165/77, pulse (!) 59, temperature 97.9 F (36.6 C), resp. rate 16, height 5\' 11"  (1.803 m), weight 105.9 kg, SpO2 99 %.    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 Antithrombotics: -DVT/anticoagulation:Pharmaceutical:Lovenox -antiplatelet therapy:  ASA/Brilinta x4 weeks followed by ASA alone. 3. Pain/RLE spasms/cramping:   -begin trial of baclofen 5mg  tid  -ice prn right ankle  -elevate, compress RLE, encouraged ongoing use of PRAFO ContinueTramadol as needed 4. Mood :team to providesupport -antipsychotic agents: N/AA 5. Neuropsych: This patientiscapable of making decisions on hisown behalf. 6. Skin/Wound Care:Routine pressure-relief measures 7. Fluids/Electrolytes/Nutrition:Monitor I's/O. Check electrolytes in a.m. 8.HTN: Monitor blood pressures twice daily.Avoid hypotension to avoid hypoperfusion. Continue Amlodipine and irbesartan have been on hold.  5/28 consider resuming norvasc   9.T2DM: Hgb A1C- 8.8. Poorly controlled.  --Monitor blood sugarsac/hsand useSSIfor elevated blood sugars. -- Continue insulin glargine 100 unitsas per home regimen.    CBG (last 3)  Recent Labs    07/26/20 2139 07/27/20 0545 07/27/20 0651  GLUCAP 228* 136* 136*    -observe for consistent pattern 10.CKD IIIb: BaselineBUN/SCR 31/1.77-->33/2.02. Encourage fluid intake. --Patient educated on avoiding NSAIDs.   11. History of gout: Continue allopurinol. Monitor for flares.  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 Continue ASA, fish oil and Lipitor. --Offbisoprolol due to bradycardia. 13. Low protein stores: Will add prosource and Ensure Max for supplement.  14. Chronic hypomagnesemia: Changed magnesium to 750mg  HS gluconate (closer to home dose)     LOS: 2 days A FACE TO FACE EVALUATION WAS PERFORMED  Meredith Staggers 07/27/2020, 10:19 AM

## 2020-07-27 NOTE — Progress Notes (Signed)
Occupational Therapy Session Note  Patient Details  Name: Mitchell Lambert MRN: [id number] Date of Birth: 09-Aug-1950  Today's Date: 07/27/2020 OT Individual Time: 1321-1435 OT Individual Time Calculation (min): 74 min    Short Term Goals: Week 1:  OT Short Term Goal 1 (Week 1): Pt will don pants with use of AE PRN with CGA. OT Short Term Goal 2 (Week 1): Pt will complete toilet transfer with CGA + LRAD. OT Short Term Goal 3 (Week 1): Pt will manage RUE during functional mobility with min VCs. OT Short Term Goal 4 (Week 1): Pt will tolerate standing ADL for > 5 min with CGA.  Skilled Therapeutic Interventions/Progress Updates:    Pt received semi-reclined in bed, c/o R ankle pain but agreeable to therapy. Session focus on RUE NMR/FMC in prep for improved bimanual ADL performance. Pt came to sitting EOB with min A to lift trunk. Squat-pivot transfer to w/c with min A for balance 2/2 R ankle/foot pain. W/c transport to and from gym total A for energy conservaion + time management. Completed massed practice of R shoulder flexion/triceps extension against incline, B shoulder flexion and hold, and chest presses with 3 lb dowel rod. Next, engaged in various [facility] activities; pt was provided medium soft theraputty and guided through putty squeezes, lumbrical flexion, key pinch flexion, digit extension, and removal of beads. Req intermittent physical assist to prevent trunk compensation throughout activities. Additionally provided tubing to build up utensil handles as he is motivated to feed himself with his dominant hand, but reports difficulty grasping utensil. Squat-pivot back to bed same manner as above and returned to supine with min A to progress RLE onto bed.  Pt left semi-reclined in bed with family present, bed alarm engaged, call bell in reach, and all immediate needs met.    Therapy Documentation Precautions:  Precautions Precautions: Fall Precaution Comments: diplopia, falls, R  hemi Restrictions Weight Bearing Restrictions: No Pain:  c/o R foot/ankle pain, did not quanitfy  ADL: See Care Tool for more details.    Therapy/Group: Individual Therapy  Volanda Napoleon MS, OTR/L  07/27/2020, 6:48 AM

## 2020-07-27 NOTE — Progress Notes (Signed)
Physical Therapy Session Note  Patient Details  Name: Mitchell Lambert MRN: [id number] Date of Birth: [birth year]  Today's Date: 07/27/2020 PT Individual Time: 0945-1100 and 8341-9622 PT Individual Time Calculation (min): 75 min and 40 min    Short Term Goals: Week 1:  PT Short Term Goal 1 (Week 1): Pt will perform bed mobility with supervision assist consisently PT Short Term Goal 2 (Week 1): Pt will Transfer to [facility] with min assist PT Short Term Goal 3 (Week 1): Pt will ambulate 173f with min assist and LRAD PT Short Term Goal 4 (Week 1): pt will propell WC >1573fwith supervision assist  Skilled Therapeutic Interventions/Progress Updates:  Session 1  Pt received supine in bed and agreeable to PT. Supine>sit transfer with supervision assist and cues for use of RUE as tolerated. Pt reports pain in the RLE 9/10 with WB throughout session with noted edema in the anterior and posterior/lateral aspects of ankle. RN is aware.   Transfers with RW and min assist for sit<>stand with cues for use of BUE to limit WB on the RLE throughout session.   WC mobility through hall x 15046fith cues for improved use of RUE to prevent veer to the R and improved control of turns.   BITS seated coordination training for visual scanning x 2 for 2 min and sequencing for alphabet. Min-mod cues for attention to task and retention of seqeucning beyond Q. Min-mod assist to improve coordination of the RUE with cues for improved posture and parascapular muscle activation.   Gait training with RW x 75f45fth min assist overall and moderate cues for use of UE to prevent pain in the RLE as well as step to gait pattern.   Pt returned to room and performed stand pivot transfer to bed with min-mod assist . Sit>supine completed with supervision assist for safety.  PT applied Ice to R ankle. left supine in bed with call bell In reach and all needs met.   Session 2.   Pt received sitting EOBand agreeable to PT. Squat pivot  transfer to WC. Throughout sess pt reported pain intermittently in the R ankle with mild spasms in ankle PF and DF. Pain worse in WB, but present at rest this PM. Pt performed WC mobility over unlevel cement sidewalk x 150ft97f 120ft 94f min assist to prevent veer to the R and improve control up uphill grades. Seated BLE NMR for LAQ, hip flextion, hip adduction, hip abduction. Noted pain in the RLE with all exertion, but decreaed with increased reps. Cues for fulL ROM and hold at end range. Patient returned to room and performed squat pivot transfer to bed with min-mod assist and cues for decreased WB through the RLE due to pain. Pt  left sitting EOB with call bell in reach and all needs met.          Therapy Documentation Precautions:  Precautions Precautions: Fall Precaution Comments: diplopia, falls, R hemi Restrictions Weight Bearing Restrictions: No Vital Signs: Therapy Vitals Pulse Rate: 63 Resp: 18 BP: (!) 147/84 Patient Position (if appropriate): Lying Oxygen Therapy SpO2: 98 % O2 Device: Room Air    Therapy/Group: Individual Therapy  AustinLorie Phenix2022, 3:29 PM

## 2020-07-28 DIAGNOSIS — M10071 Idiopathic gout, right ankle and foot: Secondary | ICD-10-CM

## 2020-07-28 LAB — GLUCOSE, CAPILLARY
Glucose-Capillary: 91 mg/dL (ref 70–99)
Glucose-Capillary: 163 mg/dL — ABNORMAL HIGH (ref 70–99)
Glucose-Capillary: 209 mg/dL — ABNORMAL HIGH (ref 70–99)
Glucose-Capillary: 329 mg/dL — ABNORMAL HIGH (ref 70–99)

## 2020-07-28 MED ORDER — AMLODIPINE BESYLATE 2.5 MG PO TABS
2.5000 mg | ORAL_TABLET | Freq: Every day | ORAL | Status: DC
  Administered 2020-07-28 – 2020-08-06 (×10): 2.5 mg via ORAL
  Filled 2020-07-28 (×10): qty 1

## 2020-07-28 MED ORDER — PREDNISONE 20 MG PO TABS
20.0000 mg | ORAL_TABLET | Freq: Three times a day (TID) | ORAL | Status: AC
  Administered 2020-07-28 – 2020-07-29 (×4): 20 mg via ORAL
  Filled 2020-07-28 (×4): qty 1

## 2020-07-28 NOTE — IPOC Note (Signed)
Overall Plan of Care [facility]) Patient Details Name: Mitchell Lambert MRN: [id number] DOB: 09-22-1950  Admitting Diagnosis: Left pontine stroke [facility])  Hospital Problems: Principal Problem:   Left pontine stroke [facility]) Active Problems:   Acute right hemiparesis ([place])     Functional Problem List: Nursing Medication Management,Safety,Endurance,Bowel  PT Balance,Edema,Endurance,Motor,Perception,Safety  OT Balance,Endurance,Cognition,Pain,Perception,Safety,Vision,Sensory,Motor  SLP    TR         Basic ADL's: OT Eating,Grooming,Bathing,Dressing,Toileting     Advanced  ADL's: OT Simple Meal Preparation     Transfers: PT Bed Mobility,Bed to [place]  OT Toilet,Tub/Shower     Locomotion: PT Wheelchair Mobility,Stairs,Ambulation     Additional Impairments: OT Fuctional Use of Upper Extremity  SLP        TR      Anticipated Outcomes Item Anticipated Outcome  Self Feeding mod I  Swallowing      Basic self-care  S  Toileting  S   Bathroom Transfers S  Bowel/Bladder  manage bowel with mod I assist  Transfers  Supervision assist with LRAD  Locomotion  Ambulatory with LRAD at supervision assist level  Communication     Cognition     Pain  n/a  Safety/Judgment  maintain safety with cues/reminders   Therapy Plan: PT Intensity: Minimum of 1-2 x/day ,45 to 90 minutes PT Frequency: 5 out of 7 days PT Duration Estimated Length of Stay: 12-16 days OT Intensity: Minimum of 1-2 x/day, 45 to 90 minutes OT Frequency: 5 out of 7 days OT Duration/Estimated Length of Stay: 12 to 14 days     Due to the current state of emergency, patients may not be receiving their 3-hours of Medicare-mandated therapy.   Team Interventions: Nursing Interventions Patient/Family Education,Bowel Management,Discharge Planning,Medication Management,Disease Management/Prevention  PT interventions Ambulation/gait training,Balance/vestibular training,Cognitive  remediation/compensation,Community reintegration,Discharge planning,Disease IT sales professional stimulation,Functional mobility training,Neuromuscular re-education,Patient/family education,Pain management,Stair training,Psychosocial support,Skin care/wound management,Splinting/orthotics,Therapeutic Activities,Therapeutic Exercise,Visual/perceptual remediation/compensation,UE/LE Coordination activities,UE/LE Strength taining/ROM,Wheelchair propulsion/positioning  OT Interventions Balance/vestibular training,Community reintegration,Disease mangement/prevention,Functional electrical stimulation,Neuromuscular re-education,Patient/family education,Self Care/advanced ADL retraining,Splinting/orthotics,Therapeutic Exercise,UE/LE Coordination activities,Wheelchair propulsion/positioning,UE/LE Strength taining/ROM,Visual/perceptual remediation/compensation,Therapeutic Activities,Skin care/wound managment,Psychosocial support,Pain management,Functional mobility training,DME/adaptive equipment instruction,Discharge planning,Cognitive remediation/compensation  SLP Interventions    TR Interventions    SW/CM Interventions Discharge Planning,Psychosocial Support,Patient/Family Education,Disease Management/Prevention   Barriers to Discharge MD  Medical stability  Nursing Decreased caregiver support,Home environment access/layout planning move to townhome; currently in 2 level 5ste,1/2 bath down and B/B 12 steps up with left rail with spouse who works as a Corporate investment banker    OT      SLP      SW       Team Discharge Planning: Destination: PT-Home ,OT- Home , SLP-  Projected Follow-up: PT-Home health PT, OT-  Outpatient OT, SLP-  Projected Equipment Needs: PT-To be determined, OT- To be determined, SLP-  Equipment Details: PT- , OT-  Patient/family involved in discharge planning: PT-  Patient,Family member/caregiver,  OT-Patient,Family member/caregiver, SLP-   MD ELOS: 12-15 days Medical Rehab Prognosis:  Excellent Assessment: The patient has been admitted for CIR therapies with the diagnosis of left pontine infarct with right hemiparesis. Course complicated by severe gout attack Right ankle. The team will be addressing functional mobility, strength, stamina, balance, safety, adaptive techniques and equipment, self-care, bowel and bladder mgt, patient and caregiver education, NMR, pain control, community reentry. Goals have been set at supervision for self-care and basic mobility.   Due to the current state of emergency, patients may not be receiving their 3 hours per  day of Medicare-mandated therapy.    Meredith Staggers, MD, FAAPMR      See Team Conference Notes for weekly updates to the plan of care

## 2020-07-28 NOTE — Progress Notes (Signed)
PROGRESS NOTE   Subjective/Complaints: Pt had a difficult night with severe right ankle pain. Hydrocodone helped somewhat to reduce intensity. I reviewed findings of xray with pt. Didn't eat much yesterday d/t pain  ROS: Patient denies fever, rash, sore throat, blurred vision, nausea, vomiting, diarrhea, cough, shortness of breath or chest pain,   headache, or mood change.    Objective:   DG Ankle 2 Views Right  Result Date: 07/27/2020 CLINICAL DATA:  Right ankle pain after rehab EXAM: RIGHT ANKLE - 2 VIEW COMPARISON:  None. FINDINGS: Frontal and lateral views of the right ankle are obtained. No acute fracture, subluxation, or dislocation. Moderate osteoarthritis of the ankle, with significant osteophytes along the talus, medial malleolus, and lateral malleolus. Mild diffuse soft tissue edema. Extensive atherosclerosis. IMPRESSION: 1. Significant osteoarthritis of the right ankle. No acute bony abnormalities. 2. Mild diffuse soft tissue edema. Electronically Signed   By: Randa Ngo M.D.   On: 07/27/2020 22:10   Recent Labs    07/26/20 0453  WBC 8.8  HGB 14.2  HCT 41.1  PLT 234   Recent Labs    07/26/20 0453  NA 139  K 4.2  CL 108  CO2 24  GLUCOSE 151*  BUN 36*  CREATININE 1.94*  CALCIUM 9.2    Intake/Output Summary (Last 24 hours) at 07/28/2020 0843 Last data filed at 07/28/2020 0700 Gross per 24 hour  Intake 600 ml  Output 700 ml  Net -100 ml        Physical Exam: Vital Signs Blood pressure (!) 174/94, pulse 87, temperature 98.2 F (36.8 C), temperature source Oral, resp. rate 20, height 5\' 11"  (1.803 m), weight 105.9 kg, SpO2 100 %. Constitutional: No distress . Vital signs reviewed. HEENT: EOMI, oral membranes moist Neck: supple Cardiovascular: RRR without murmur. No JVD    Respiratory/Chest: CTA Bilaterally without wheezes or rales. Normal effort    GI/Abdomen: BS +, non-tender, non-distended Ext:  no clubbing, cyanosis, or edema Psych: pleasant and cooperative Musculoskeletal:  Cervical back: Normal range of motion.  Right ankle remains swollen and tender to touch and even slight rom.  RUE- biceps 4-/5, triceps 4+/5, grip 4/5, FA 2-/5 LUE 5/5 in same muscles RLE- HF 3-/5, KE 3-/5, KF 3/5, DF 0-1/5; PF 2-/5 Skin: General: Skin is warmand dry.  Neurological:  Mental Status: He is alertand oriented to person, place, and time.    Right facial droop without dysarthria.  Left ptosis with diplopia to left field and split vision in left lower field. Right hemiplegia with decrease in fine motor coordination.  RUE 4/5 prox to 2/5 distal. RLE 3/5 prox to 1-2/5 ADF/PF. Sensation intact Apraxic with RUE Language functional. Sl word finding deficits     Assessment/Plan: 1. Functional deficits which require 3+ hours per day of interdisciplinary therapy in a comprehensive inpatient rehab setting.  Physiatrist is providing close team supervision and 24 hour management of active medical problems listed below.  Physiatrist and rehab team continue to assess barriers to discharge/monitor patient progress toward functional and medical goals  Care Tool:  Bathing    Body parts bathed by patient: Right arm,Chest,Abdomen,Front perineal area,Left upper leg,Face   Body parts bathed  by helper: Left arm,Buttocks,Left lower leg,Right lower leg     Bathing assist Assist Level: Minimal Assistance - Patient > 75%     Upper Body Dressing/Undressing Upper body dressing   What is the patient wearing?: Pull over shirt    Upper body assist Assist Level: Minimal Assistance - Patient > 75%    Lower Body Dressing/Undressing Lower body dressing      What is the patient wearing?: Pants     Lower body assist Assist for lower body dressing: Minimal Assistance - Patient > 75%     Toileting Toileting    Toileting assist Assist for toileting: Minimal Assistance - Patient > 75%      Transfers Chair/bed transfer  Transfers assist     Chair/bed transfer assist level: Moderate Assistance - Patient 50 - 74%     Locomotion Ambulation   Ambulation assist      Assist level: Moderate Assistance - Patient 50 - 74% Assistive device: No Device Max distance: 50   Walk 10 feet activity   Assist     Assist level: Moderate Assistance - Patient - 50 - 74% Assistive device: No Device   Walk 50 feet activity   Assist    Assist level: Moderate Assistance - Patient - 50 - 74% Assistive device: Walker-standard    Walk 150 feet activity   Assist Walk 150 feet activity did not occur: Safety/medical concerns    Assistive device: Walker-standard    Walk 10 feet on uneven surface  activity   Assist Walk 10 feet on uneven surfaces activity did not occur: Safety/medical concerns         Wheelchair     Assist   Type of Wheelchair: Manual    Wheelchair assist level: Minimal Assistance - Patient > 75% Max wheelchair distance: 150    Wheelchair 50 feet with 2 turns activity    Assist        Assist Level: Minimal Assistance - Patient > 75%   Wheelchair 150 feet activity     Assist      Assist Level: Minimal Assistance - Patient > 75%   Blood pressure (!) 174/94, pulse 87, temperature 98.2 F (36.8 C), temperature source Oral, resp. rate 20, height 5\' 11"  (1.803 m), weight 105.9 kg, SpO2 100 %.    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 Antithrombotics: -DVT/anticoagulation:Pharmaceutical:Lovenox -antiplatelet therapy:  ASA/Brilinta x4 weeks followed by ASA alone. 3. Pain/RLE spasms/cramping:   -continue trial of baclofen 5mg   tid  -Likely gout flare right ankle. Pain is severe. Probably flared by existing OA, tone   - Pt on allopurinol chronically   - prednisone taper---begin 20mg  TID today and taper per primary team tomorrow   -reviewed xray of ankle which demonstrates significant OA   -hydrocodone for breakthrough pain control ContinueTramadol as needed 4. Mood :team to providesupport -antipsychotic agents: N/AA 5. Neuropsych: This patientiscapable of making decisions on hisown behalf. 6. Skin/Wound Care:Routine pressure-relief measures 7. Fluids/Electrolytes/Nutrition:Monitor I's/O. Check electrolytes in a.m. 8.HTN: Monitor blood pressures twice daily.Avoid hypotension to avoid hypoperfusion. Continue Amlodipine and irbesartan have been on hold.  5/29 resume norvasc at 2.5mg   9.T2DM: Hgb A1C- 8.8. Poorly controlled.  --Monitor blood sugarsac/hsand useSSIfor elevated blood sugars. -- Continue insulin glargine 100 unitsas  per home regimen.    CBG (last 3)  Recent Labs    07/27/20 1654 07/27/20 2117 07/28/20 0555  GLUCAP 105* 92 91    -discussed with pt that we will see a bump in his CBG's while on prednisone 10.CKD IIIb: BaselineBUN/SCR 31/1.77-->33/2.02. Encourage fluid intake. --Patient educated on avoiding NSAIDs.   11. History of gout: Continue allopurinol. Monitor for flares.  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 Continue ASA, fish oil and Lipitor. --Offbisoprolol due to bradycardia. 13. Low protein stores: Will add prosource and Ensure Max for supplement.  14. Chronic hypomagnesemia: Changed magnesium to 750mg  HS gluconate (closer to home dose)     LOS: 3 days A FACE TO [place] 07/28/2020, 8:43 AM

## 2020-07-29 LAB — CBC
HCT: 38.7 % — ABNORMAL LOW (ref 39.0–52.0)
Hemoglobin: 13.5 g/dL (ref 13.0–17.0)
MCH: 31.5 pg (ref 26.0–34.0)
MCHC: 34.9 g/dL (ref 30.0–36.0)
MCV: 90.2 fL (ref 80.0–100.0)
Platelets: 242 10*3/uL (ref 150–400)
RBC: 4.29 MIL/uL (ref 4.22–5.81)
RDW: 13.2 % (ref 11.5–15.5)
WBC: 10.4 10*3/uL (ref 4.0–10.5)
nRBC: 0 % (ref 0.0–0.2)

## 2020-07-29 LAB — GLUCOSE, CAPILLARY
Glucose-Capillary: 277 mg/dL — ABNORMAL HIGH (ref 70–99)
Glucose-Capillary: 244 mg/dL — ABNORMAL HIGH (ref 70–99)
Glucose-Capillary: 239 mg/dL — ABNORMAL HIGH (ref 70–99)
Glucose-Capillary: 201 mg/dL — ABNORMAL HIGH (ref 70–99)

## 2020-07-29 LAB — BASIC METABOLIC PANEL
Anion gap: 8 (ref 5–15)
BUN: 53 mg/dL — ABNORMAL HIGH (ref 8–23)
CO2: 21 mmol/L — ABNORMAL LOW (ref 22–32)
Calcium: 9.1 mg/dL (ref 8.9–10.3)
Chloride: 107 mmol/L (ref 98–111)
Creatinine, Ser: 2.16 mg/dL — ABNORMAL HIGH (ref 0.61–1.24)
GFR, Estimated: 32 mL/min — ABNORMAL LOW (ref >60)
Glucose, Bld: 304 mg/dL — ABNORMAL HIGH (ref 70–99)
Potassium: 4.7 mmol/L (ref 3.5–5.1)
Sodium: 136 mmol/L (ref 135–145)

## 2020-07-29 MED ORDER — PREDNISONE 20 MG PO TABS
20.0000 mg | ORAL_TABLET | Freq: Two times a day (BID) | ORAL | Status: DC
  Administered 2020-07-29 – 2020-07-30 (×2): 20 mg via ORAL
  Filled 2020-07-29 (×2): qty 1

## 2020-07-29 NOTE — Progress Notes (Signed)
Physical Therapy Session Note  Patient Details  Name: Mitchell Lambert MRN: [id number] Date of Birth: 07-31-1950  Today's Date: 07/29/2020 PT Individual Time: 1620-1705 PT Individual Time Calculation (min): 45 min   Short Term Goals: Week 1:  PT Short Term Goal 1 (Week 1): Pt will perform bed mobility with supervision assist consisently PT Short Term Goal 2 (Week 1): Pt will Transfer to [facility] with min assist PT Short Term Goal 3 (Week 1): Pt will ambulate 144f with min assist and LRAD PT Short Term Goal 4 (Week 1): pt will propell WC >1551fwith supervision assist  Skilled Therapeutic Interventions/Progress Updates:   Pt received sitting EOB and agreeable to PT.   [facility] and min assist. Pt performed WC mobility through hall with supervision assist x 20068fith cues for turn control and doorway management.   Gait training in rehab gym with  RW, DF wrap x [id number] assist from PT for safety and to improve knee control to block GR. No pain noted in the LLE with gait on this day.   Kinetron BLE reciprocal movement training 2 x 2 min with cues for full ROM and neutral hip ER/IR.    Pt returned to room and performed stand pivot transfer to bed with RW and min assit. Pt left sitting EOB bed with call bell in reach and all needs met.       Therapy Documentation Precautions:  Precautions Precautions: Fall Precaution Comments: diplopia, falls, R hemi Restrictions Weight Bearing Restrictions: No   Pain: Pain Assessment Pain Scale: 0-10 Pain Score: 0-No pain    Therapy/Group: Individual Therapy  AustLorie Phenix0/2022, 6:36 PM

## 2020-07-29 NOTE — Progress Notes (Signed)
Physical Therapy Session Note  Patient Details  Name: Mitchell Lambert MRN: [id number] Date of Birth: 07/27/1950  Today's Date: 07/29/2020 PT Individual Time: 1000-1045 PT Individual Time Calculation (min): 45 min   Short Term Goals: Week 1:  PT Short Term Goal 1 (Week 1): Pt will perform bed mobility with supervision assist consisently PT Short Term Goal 2 (Week 1): Pt will Transfer to [facility] with min assist PT Short Term Goal 3 (Week 1): Pt will ambulate 156ft with min assist and LRAD PT Short Term Goal 4 (Week 1): pt will propell WC >11ft with supervision assist  Skilled Therapeutic Interventions/Progress Updates:    Patient received sitting up in bed, wife at bedside, agreeable to PT. He reports 3/10 pain in R ankle at rest and up to 8/10 with weightbearing/DF<> PF movement, premedicated. PT providing rest breaks, distractions and repositioning to assist with pain management. PT donning TED hose and socks MaxA for time management. He was able to come sit edge of bed with supervision and HOB elevated. Once seated patient gradually increasing weight bearing through R LE with anterior weight shifts in preparation for stand pivot transfer to wc. Patient required MinA to complete stand pivot with RW and extended time due to increasing pain in R ankle. Morning ADLs completed at sink with MinA and set up assist. Wife at bedside with multiple questions regarding prognosis of motor return. PT providing ed about that. PT transporting patient in wc to therapy gym for time management and energy conservation. He ambulated 34ft with RW and MinA and decreased gait speed due to R ankle pain. Patient with R knee hyperextension in stance to accommodate for decreased R ankle dorsiflexion tolerance. PT unable to achieve neutral ankle positioning on R- question tone vs stiffness vs guarding. Patient returning to room in wc, declining seatbelt alarm (RN aware), call light within reach.   Therapy  Documentation Precautions:  Precautions Precautions: Fall Precaution Comments: diplopia, falls, R hemi Restrictions Weight Bearing Restrictions: No    Therapy/Group: Individual Therapy  Karoline Caldwell, PT, DPT, CBIS  07/29/2020, 7:41 AM

## 2020-07-29 NOTE — Progress Notes (Signed)
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    Objective:   DG Ankle 2 Views Right  Result Date: 07/27/2020 CLINICAL DATA:  Right ankle pain after rehab EXAM: RIGHT ANKLE - 2 VIEW COMPARISON:  None. FINDINGS: Frontal and lateral views of the right ankle are obtained. No acute fracture, subluxation, or dislocation. Moderate osteoarthritis of the ankle, with significant osteophytes along the talus, medial malleolus, and lateral malleolus. Mild diffuse soft tissue edema. Extensive atherosclerosis. IMPRESSION: 1. Significant osteoarthritis of the right ankle. No acute bony abnormalities. 2. Mild diffuse soft tissue edema. Electronically Signed   By: Randa Ngo M.D.   On: 07/27/2020 22:10   Recent Labs    07/29/20 0435  WBC 10.4  HGB 13.5  HCT 38.7*  PLT 242   Recent Labs    07/29/20 0435  NA 136  K 4.7  CL 107  CO2 21*  GLUCOSE 304*  BUN 53*  CREATININE 2.16*  CALCIUM 9.1    Intake/Output Summary (Last 24 hours) at 07/29/2020 1148 Last data filed at 07/29/2020 3419 Gross per 24 hour  Intake 240 ml  Output 875 ml  Net -635 ml        Physical Exam: Vital Signs Blood pressure (!) 157/84, pulse 62, temperature 98.6 F (37 C), resp. rate 17, height 5\' 11"  (1.803 m), weight 105.9 kg, SpO2 98 %.  General: No acute distress Mood and affect are appropriate Heart: Regular rate and rhythm no rubs murmurs or extra sounds Lungs: Clear to auscultation, breathing  unlabored, no rales or wheezes Abdomen: Positive bowel sounds, soft nontender to palpation, nondistended Extremities: No clubbing, cyanosis, or edema Skin: No evidence of breakdown, no evidence of rash   Musculoskeletal:  Cervical back: Normal range of motion.  Right ankle remains swollen and tender to touch and even slight rom.  RUE- biceps 4-/5, triceps 4+/5, grip 4/5, FA 2-/5 LUE 5/5 in same muscles RLE- HF 3-/5, KE 3-/5, KF 3/5, DF 0-1/5; PF 2-/5 Skin: General: Skin is warmand dry.  Neurological:  Mental Status: He is alertand oriented to person, place, and time.    Right facial droop without dysarthria.  Left ptosis with diplopia to left field and split vision in left lower field. Right hemiplegia with decrease in fine motor coordination.  RUE 4/5 prox to 2/5 distal. RLE 3/5 prox to 1-2/5 ADF/PF. Sensation intact Apraxic with RUE Language functional. Sl word finding deficits     Assessment/Plan: 1. Functional deficits which require 3+ hours per day of interdisciplinary therapy in a comprehensive inpatient rehab setting.  Physiatrist is providing close team supervision and 24 hour management of active medical problems listed below.  Physiatrist and rehab team continue to assess barriers to discharge/monitor patient progress toward functional  and medical goals  Care Tool:  Bathing    Body parts bathed by patient: Right arm,Chest,Abdomen,Front perineal area,Left upper leg,Face   Body parts bathed by helper: Left arm,Buttocks,Left lower leg,Right lower leg     Bathing assist Assist Level: Minimal Assistance - Patient > 75%     Upper Body Dressing/Undressing Upper body dressing   What is the patient wearing?: Pull over shirt    Upper body assist Assist Level: Minimal Assistance - Patient > 75%    Lower Body Dressing/Undressing Lower body dressing      What is the patient wearing?: Pants     Lower body assist Assist for lower body dressing:  Minimal Assistance - Patient > 75%     Toileting Toileting    Toileting assist Assist for toileting: Minimal Assistance - Patient > 75%     Transfers Chair/bed transfer  Transfers assist     Chair/bed transfer assist level: Moderate Assistance - Patient 50 - 74%     Locomotion Ambulation   Ambulation assist      Assist level: Moderate Assistance - Patient 50 - 74% Assistive device: No Device Max distance: 50   Walk 10 feet activity   Assist     Assist level: Moderate Assistance - Patient - 50 - 74% Assistive device: No Device   Walk 50 feet activity   Assist    Assist level: Moderate Assistance - Patient - 50 - 74%      Walk 150 feet activity   Assist Walk 150 feet activity did not occur: Safety/medical concerns         Walk 10 feet on uneven surface  activity   Assist Walk 10 feet on uneven surfaces activity did not occur: Safety/medical concerns         Wheelchair     Assist Will patient use wheelchair at discharge?: Yes (Per PT long term goals) Type of Wheelchair: Manual    Wheelchair assist level: Minimal Assistance - Patient > 75% Max wheelchair distance: 150    Wheelchair 50 feet with 2 turns activity    Assist        Assist Level: Minimal Assistance - Patient > 75%   Wheelchair 150 feet activity     Assist      Assist Level: Minimal Assistance - Patient > 75%   Blood pressure (!) 157/84, pulse 62, temperature 98.6 F (37 C), resp. rate 17, height 5\' 11"  (1.803 m), weight 105.9 kg, SpO2 98 %.    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  Antithrombotics: -DVT/anticoagulation:Pharmaceutical:Lovenox -antiplatelet therapy:  ASA/Brilinta x4 weeks followed by ASA alone. 3. Pain/RLE spasms/cramping:   -continue trial of baclofen 5mg  tid  -OA vs  gout flare right ankle. Pain is severe. Probably flared by existing OA, tone   - Pt on allopurinol chronically   - prednisone taper---begin 20mg  TID today and taper starting today  Add Right ankle splint    -reviewed xray of ankle which demonstrates significant OA   -hydrocodone for breakthrough pain control ContinueTramadol as needed 4. Mood :team to providesupport -antipsychotic agents: N/AA 5. Neuropsych: This patientiscapable of making decisions on hisown behalf. 6. Skin/Wound Care:Routine pressure-relief measures 7. Fluids/Electrolytes/Nutrition:Monitor I's/O. Check electrolytes in a.m. 8.HTN:  Monitor blood pressures twice daily.Avoid hypotension to avoid hypoperfusion. Continue Amlodipine and irbesartan have been on hold.  5/29 resume norvasc at 2.5mg   9.T2DM: Hgb A1C- 8.8. Poorly controlled.  --Monitor blood sugarsac/hsand useSSIfor elevated blood sugars. -- Continue insulin glargine 100 unitsas per home regimen.    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 Encourage fluid intake. --Patient educated on avoiding NSAIDs.  cont to increase will start IVF 11. ?History of gout vs kidney stone prophyllaxisContinue allopurinol. Monitor for flares.  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 Continue ASA, fish oil and Lipitor. --Offbisoprolol due to bradycardia. 13. Low protein stores: Will add prosource and Ensure Max for supplement.  14. Chronic  hypomagnesemia: Changed magnesium to 750mg  HS gluconate (closer to home dose)     LOS: 4 days A FACE TO [place] E Alizandra Loh 07/29/2020, 11:48 AM

## 2020-07-29 NOTE — Progress Notes (Signed)
Orthopedic Tech Progress Note Patient Details:  Mitchell Lambert 1950-07-21 [id number] Called in order to [employer] for an ANKLE BRACE Patient ID: Mitchell Lambert, male   DOB: August 20, 1950, 70 y.o.   MRN: [id number]   Mitchell Lambert 07/29/2020, 12:38 PM

## 2020-07-29 NOTE — Progress Notes (Signed)
Physical Therapy Session Note  Patient Details  Name: Mitchell Lambert MRN: [id number] Date of Birth: 1950-12-28  Today's Date: 07/29/2020 PT Individual Time: 1303-1402  PT Individual Time Calculation: 59 min   Short Term Goals: Week 1:  PT Short Term Goal 1 (Week 1): Pt will perform bed mobility with supervision assist consisently PT Short Term Goal 2 (Week 1): Pt will Transfer to [facility] with min assist PT Short Term Goal 3 (Week 1): Pt will ambulate 146ft with min assist and LRAD PT Short Term Goal 4 (Week 1): pt will propell WC >130ft with supervision assist  Skilled Therapeutic Interventions/Progress Updates:  Patient seated EOB on entrance to room. Patient alert and agreeable to PT session. Patient denied pain at start of session but does relate recent R ankle pain from sprain with noted edema. Related that ankle has been painful to WB. Pt indicates anteromedial aspect of navicular bone as area of stabbing pain when pt is experiencing worst pain symptoms. Noted ortho tech note for ordered ankle brace from [employer]. Due to foot drop, pt will also require f/u with [employer] for R AFO.  Therapeutic Activity: Transfers: Patient guided in STS transfers with focus on performance with no UE support. Initially requires heavy vc/ tc and light Min A to complete rise and controlled descent with no AD. Progresses throughout session to performing with close supervision. VC provided intermittently for technique. SPVT transfers continue to require use of RW and CGA to complete safely. Squat pivot transfer bed--> w/c to R side and heavy vc for hand/ foot placement and technique. Performs with light Min A.   Wheelchair Mobility:  Patient propelled wheelchair 200 feet with supervision. Requires one instance of Min A to clear RUE around corner. Provided vc for increasing RUE effort for even bilateral push and for 12 o'clock hand positioning.  Gait Training:  Patient ambulated 10 feet x2 using RW with CGA  demonstrating steppage gait and heavy BUE pressure into walker with forward flexion. Provided vc/ tc for upright posture, level gaze and decreasing pressure into walker. No pain indicated from pt.  Neuromuscular Re-ed: NMR facilitated during session with focus on standing balance and RLE coordination. Pt guided in RLE toe touches requiring vc to clear foot from step for toe touch and prior to removal from step. Pt able to maintain R knee extension with LLE toe touches with guard to R knee. Progressed to R toe touches to cone with good accuracy/ control demonstrated.  NMR performed for improvements in motor control and coordination, balance, sequencing, judgement, and self confidence/ efficacy in performing all aspects of mobility at highest level of independence.   Patient seated EOB at end of session with bed alarm set, and all needs within reach.    Therapy Documentation Precautions:  Precautions Precautions: Fall Precaution Comments: diplopia, falls, R hemi Restrictions Weight Bearing Restrictions: No  Therapy/Group: Individual Therapy  Alger Simons PT, DPT  07/29/2020, 12:26 PM

## 2020-07-29 NOTE — Progress Notes (Signed)
Occupational Therapy Session Note  Patient Details  Name: Mitchell Lambert MRN: [id number] Date of Birth: 10-22-1950  Today's Date: 07/29/2020 OT Individual Time: 8841-6606 OT Individual Time Calculation (min): 55 min    Short Term Goals: Week 1:  OT Short Term Goal 1 (Week 1): Pt will don pants with use of AE PRN with CGA. OT Short Term Goal 2 (Week 1): Pt will complete toilet transfer with CGA + LRAD. OT Short Term Goal 3 (Week 1): Pt will manage RUE during functional mobility with min VCs. OT Short Term Goal 4 (Week 1): Pt will tolerate standing ADL for > 5 min with CGA.  Skilled Therapeutic Interventions/Progress Updates:    Pt sitting up in w/c, requesting to bathe and toilet.  Pt reports he is still experiencing double vision as well as right ankle pain.  MD made aware.  Pt completed stand pivot w/c to toilet with CGA.  Pt had continent episode of urine.  Complains of feeling constipated, nurse made aware.  Pt ambulated a few steps from toilet to walk in shower and pivot to shower bench using RW needing CGA and intermittent guarding of right knee due to slight buckling.    Pt doffed shirt with supervision and pants/underwear with min assist to thread over right foot.  Pt bathed UB with supervision needing intermittent VCs to facilitate increased functional use of right dominant arm due to pt compensating with LUE use primarily.  Pt bathed LB with CGA for sit to stand during peri washing. Educated pt on importance of drying self and floors prior to exiting shower.    Pt donned shirt with increased time with use of RUE to assist.  Shorts and underwear donned with min assist. MD arrived and completed daily assessment. Discussed possibly utilizing eye patching to address pts diplopia.  Pt reports he does have an eye patch but prefers not to use it because "its just depressing with everything else going on" due to difficulty seeing when patched. Bilateral socks donned with max assist due to time  constraint.  Stand pivot w/c to EOB using RW with CGA.  Sit to supine with supervision.  Call bell in reach, bed alarm on.     Therapy Documentation Precautions:  Precautions Precautions: Fall Precaution Comments: diplopia, falls, R hemi Restrictions Weight Bearing Restrictions: No   Therapy/Group: Individual Therapy  Ezekiel Slocumb 07/29/2020, 11:31 AM

## 2020-07-29 NOTE — Progress Notes (Signed)
Pt expressed concerns regarding Prednisone and how it can effect his Kidneys. Pt was educated regarding prednisone, demonstrates understanding. Pt would like to talk to MD regarding his kidney function.

## 2020-07-30 LAB — GLUCOSE, CAPILLARY
Glucose-Capillary: 159 mg/dL — ABNORMAL HIGH (ref 70–99)
Glucose-Capillary: 203 mg/dL — ABNORMAL HIGH (ref 70–99)
Glucose-Capillary: 223 mg/dL — ABNORMAL HIGH (ref 70–99)
Glucose-Capillary: 273 mg/dL — ABNORMAL HIGH (ref 70–99)

## 2020-07-30 MED ORDER — SORBITOL 70 % SOLN
30.0000 mL | Freq: Every day | Status: DC | PRN
  Administered 2020-07-30 – 2020-08-06 (×5): 30 mL via ORAL
  Filled 2020-07-30 (×5): qty 30

## 2020-07-30 MED ORDER — PREDNISONE 5 MG PO TABS
15.0000 mg | ORAL_TABLET | Freq: Two times a day (BID) | ORAL | Status: AC
  Administered 2020-07-31 (×2): 15 mg via ORAL
  Filled 2020-07-30 (×2): qty 3

## 2020-07-30 MED ORDER — TRAMADOL HCL 50 MG PO TABS
50.0000 mg | ORAL_TABLET | Freq: Two times a day (BID) | ORAL | Status: DC | PRN
  Administered 2020-07-30: 50 mg via ORAL
  Filled 2020-07-30 (×2): qty 1

## 2020-07-30 NOTE — Progress Notes (Signed)
Occupational Therapy Session Note  Patient Details  Name: Mitchell Lambert MRN: [id number] Date of Birth: 1950/05/04  Today's Date: 07/30/2020 OT Individual Time: 1430-1500 OT Individual Time Calculation (min): 30 min    Short Term Goals: Week 1:  OT Short Term Goal 1 (Week 1): Pt will don pants with use of AE PRN with CGA. OT Short Term Goal 2 (Week 1): Pt will complete toilet transfer with CGA + LRAD. OT Short Term Goal 3 (Week 1): Pt will manage RUE during functional mobility with min VCs. OT Short Term Goal 4 (Week 1): Pt will tolerate standing ADL for > 5 min with CGA.  Skilled Therapeutic Interventions/Progress Updates:    Pt received from OT, consented to more OT tx. Pt seen for standing tolerance and balance tasks this session. Instructed in elevated table top activity with tacks to increase R hand fine motor control and coordination. Pt req mod A with L hand to manipulate tacks in hand and place in proper spots in cork board. Pt required two seated rest breaks during activity due to fatigue, and was able to complete entire activity with increased time. After tx, pt helped back to bed with min A with all needs met, bed alarm on.    Therapy Documentation Precautions:  Precautions Precautions: Fall Precaution Comments: diplopia, falls, R hemi Restrictions Weight Bearing Restrictions: No   Vital Signs: Therapy Vitals Temp: 98.3 F (36.8 C) Temp Source: Oral Pulse Rate: 68 Resp: 18 BP: (!) 160/86 Patient Position (if appropriate): Sitting Oxygen Therapy SpO2: 97 % O2 Device: Room Air Pain:   none   Therapy/Group: Individual Therapy  [name] 07/30/2020, 3:35 PM

## 2020-07-30 NOTE — Progress Notes (Signed)
Occupational Therapy Session Note  Patient Details  Name: Mitchell Lambert MRN: [id number] Date of Birth: [birth year]  Today's Date: 07/30/2020 OT Individual Time: 0803-0901 OT Individual Time Calculation (min): 58 min + 29 min + 33 min   Short Term Goals: Week 1:  OT Short Term Goal 1 (Week 1): Pt will don pants with use of AE PRN with CGA. OT Short Term Goal 2 (Week 1): Pt will complete toilet transfer with CGA + LRAD. OT Short Term Goal 3 (Week 1): Pt will manage RUE during functional mobility with min VCs. OT Short Term Goal 4 (Week 1): Pt will tolerate standing ADL for > 5 min with CGA.  Skilled Therapeutic Interventions/Progress Updates:    Session 1: Pt received semi-reclined in bed, denies pain, agreeable to therapy. Came to sitting EOB close S with use of bed rail. Stand-pivot with RW to w/c with min A for balance. Completed oral care seated at sink with set-up A. Doffed/donned shirt with S and min VCs for hemi technique. Self-propelled w/c to and from gym with increased time + close S; demonstrates good technique. Session focus on RUE NMR/WB in prep for improved bimanual ADL performance. Seated in w/c, completed single target game + bell cancellation using RUE + built up stylus. Req A to prevent trunk lateral flexion and to facilitate triceps extension. Eye patch donned over R eye during activity to decrease diplopia, pt reports premorbid "floaters" in L eye that make it difficult to see screen regardless. Finally, pt completed massed practiced of modified push-ups and holding towel against wall with RUE. Pt req A to facilitate triceps extension and keeping R hand in place on wall. Stand-pivot back to bed same manner as before. Pt left seated EOB with call bell in reach, and all immediate needs met.   Session 2: Pt received semi-reclined in bed, denies pain, agreeable to therapy. Came to sitting EOB close S with use of bed rail. Stand-pivot with RW to w/c with no AD and CGA for balance.  Total A for w/c transport to and from gym 2/2 time management. Stand-pivot to and from mat table no AD with CGA. Session focus on RUE WB/ NMR in prep for improved bimanual ADL performance. In supine, completed 1x20 chest press + shoulder flexion and hold with 2 lb dowel rod. Transitioned to quadruped to complete table top activity with LUE. No LOB and able to support himself with just use of RUE. Finally, guided through lower back + hamstring stretches in supine and sitting edge of mat with good tolerance. Stand-pivot back to bed same manner as above. Pt left sitting EOB with call bell in reach, and all immediate needs met.    Session 3: Pt received semi-reclined in bed with son present, denies pain, agreeable to therapy. Came to sitting EOB close S with use of bed rail. Stand-pivot with RW to w/c with no AD and CGA for balance. Session focus on L oculomotor exercises to assist in decreasing diplopia in prep for improved ADL/IADL performance. Pt guided through various convergence insufficiency exercises with use of pencil and brock string (focusing on targets placed at near, mid, and distant targets, target tracking, pencil push-ups, etc.) Denies headache or nausea at conclusion of exercise. Will cont to integrate exercises during therapy and monitor. Pt handoff to OT for next session.  Therapy Documentation Precautions:  Precautions Precautions: Fall Precaution Comments: diplopia, falls, R hemi Restrictions Weight Bearing Restrictions: No Pain: Pain Assessment Pain Scale: 0-10 Pain Score: Asleep Pain  Location: Head Pain Intervention(s): Medication (See eMAR) ADL: See Care Tool for more details. Therapy/Group: Individual Therapy  Volanda Napoleon MS, OTR/L  07/30/2020, 6:36 AM

## 2020-07-30 NOTE — Progress Notes (Signed)
PROGRESS NOTE   Subjective/Complaints:  Discussed >7year hx of Right ankle pain , used to go to pain clinic Also hx of gout and of urate kidney stones Was on tramadol in past with relief Discussed prednisone taper ROS: Patient denies CP, SOB, N/V/D  Objective:   No results found. Recent Labs    07/29/20 0435  WBC 10.4  HGB 13.5  HCT 38.7*  PLT 242   Recent Labs    07/29/20 0435  NA 136  K 4.7  CL 107  CO2 21*  GLUCOSE 304*  BUN 53*  CREATININE 2.16*  CALCIUM 9.1    Intake/Output Summary (Last 24 hours) at 07/30/2020 0906 Last data filed at 07/30/2020 0819 Gross per 24 hour  Intake 440 ml  Output 1025 ml  Net -585 ml        Physical Exam: Vital Signs Blood pressure (!) 154/86, pulse (!) 55, temperature 98.1 F (36.7 C), temperature source Oral, resp. rate 18, height 5\' 11"  (1.803 m), weight 105.9 kg, SpO2 96 %.  General: No acute distress Mood and affect are appropriate Heart: Regular rate and rhythm no rubs murmurs or extra sounds Lungs: Clear to auscultation, breathing unlabored, no rales or wheezes Abdomen: Positive bowel sounds, soft nontender to palpation, nondistended Extremities: No clubbing, cyanosis, or edema Skin: No evidence of breakdown, no evidence of rash   Musculoskeletal:  Cervical back: Normal range of motion.  Right ankle remains swollen and tender to touch and even slight rom.  RUE- biceps 4-/5, triceps 4+/5, grip 4/5, FA 2-/5 LUE 5/5 in same muscles RLE- HF 3-/5, KE 3-/5, KF 3/5, DF 0-1/5; PF 2-/5 Skin: General: Skin is warmand dry.  Neurological:  Mental Status: He is alertand oriented to person, place, and time.    Right facial droop without dysarthria.  Left ptosis with diplopia to left field and split vision in left lower field. Right hemiplegia with decrease in fine motor coordination.  RUE 4/5 prox to 2/5 distal. RLE 3/5 prox to 1-2/5 ADF/PF. Sensation  intact Apraxic with RUE      Assessment/Plan: 1. Functional deficits which require 3+ hours per day of interdisciplinary therapy in a comprehensive inpatient rehab setting.  Physiatrist is providing close team supervision and 24 hour management of active medical problems listed below.  Physiatrist and rehab team continue to assess barriers to discharge/monitor patient progress toward functional and medical goals  Care Tool:  Bathing    Body parts bathed by patient: Right arm,Chest,Abdomen,Front perineal area,Left upper leg,Face   Body parts bathed by helper: Left arm,Buttocks,Left lower leg,Right lower leg     Bathing assist Assist Level: Minimal Assistance - Patient > 75%     Upper Body Dressing/Undressing Upper body dressing   What is the patient wearing?: Pull over shirt    Upper body assist Assist Level: Minimal Assistance - Patient > 75%    Lower Body Dressing/Undressing Lower body dressing      What is the patient wearing?: Pants     Lower body assist Assist for lower body dressing: Minimal Assistance - Patient > 75%     Toileting Toileting    Toileting assist Assist for toileting: Minimal Assistance -  Patient > 75%     Transfers Chair/bed transfer  Transfers assist     Chair/bed transfer assist level: Moderate Assistance - Patient 50 - 74%     Locomotion Ambulation   Ambulation assist      Assist level: Moderate Assistance - Patient 50 - 74% Assistive device: No Device Max distance: 50   Walk 10 feet activity   Assist     Assist level: Moderate Assistance - Patient - 50 - 74% Assistive device: No Device   Walk 50 feet activity   Assist    Assist level: Moderate Assistance - Patient - 50 - 74%      Walk 150 feet activity   Assist Walk 150 feet activity did not occur: Safety/medical concerns         Walk 10 feet on uneven surface  activity   Assist Walk 10 feet on uneven surfaces activity did not occur:  Safety/medical concerns         Wheelchair     Assist Will patient use wheelchair at discharge?: Yes (Per PT long term goals) Type of Wheelchair: Manual    Wheelchair assist level: Minimal Assistance - Patient > 75% Max wheelchair distance: 150    Wheelchair 50 feet with 2 turns activity    Assist        Assist Level: Minimal Assistance - Patient > 75%   Wheelchair 150 feet activity     Assist      Assist Level: Minimal Assistance - Patient > 75%   Blood pressure (!) 154/86, pulse (!) 55, temperature 98.1 F (36.7 C), temperature source Oral, resp. rate 18, height 5\' 11"  (1.803 m), weight 105.9 kg, SpO2 96 %.    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 Antithrombotics: -DVT/anticoagulation:Pharmaceutical:Lovenox -antiplatelet therapy:  ASA/Brilinta x4 weeks followed by ASA alone. 3. Pain/RLE spasms/cramping:   -continue trial of baclofen 5mg  tid  -OA vs  gout flare right ankle. Pain is severe. Probably flared by existing OA, tone   - Pt on allopurinol chronically   - prednisone taper---begin 20mg  BID today and taper to 15mg  starting tomorrow  Add Right ankle splint    -reviewed xray of ankle which demonstrates significant OA   -hydrocodone for breakthrough pain control, tramadol for moderate pain ContinueTramadol as needed 4. Mood :team to providesupport -antipsychotic agents: N/AA 5. Neuropsych: This patientiscapable of making decisions on hisown behalf. 6. Skin/Wound Care:Routine pressure-relief measures 7. Fluids/Electrolytes/Nutrition:Monitor I's/O. Check electrolytes in a.m. 8.HTN:  Monitor blood pressures twice daily.Avoid hypotension to avoid hypoperfusion. Continue Amlodipine and irbesartan have been on hold.  5/29 resume norvasc at 2.5mg   9.T2DM: Hgb A1C- 8.8. Poorly controlled.  --Monitor blood sugarsac/hsand useSSIfor elevated blood sugars. -- Continue insulin glargine 100 unitsas per home regimen.    CBG (last 3)  Recent Labs    07/29/20 1703 07/29/20 2137 07/30/20 0603  GLUCAP 239* 201* 159*    -discussed with pt that we will see a bump in his CBG's while on prednisone starting to wean  10.CKD IIIb: BaselineBUN/SCR 31/1.77-->33/2.02. Encourage fluid intake. --Patient educated on avoiding NSAIDs.  cont to increase will start IVF 11. ?History of gout vs kidney stone prophyllaxisContinue allopurinol. Monitor for flares.  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 Continue ASA, fish oil and Lipitor. --Offbisoprolol due to bradycardia. 13. Low protein stores: Will add prosource and Ensure Max for supplement.  14. Chronic hypomagnesemia: Changed magnesium to 750mg  HS gluconate (closer to home dose)     LOS: 5 days A FACE TO [place] E Sholanda Croson 07/30/2020, 9:06 AM

## 2020-07-31 LAB — GLUCOSE, CAPILLARY
Glucose-Capillary: 57 mg/dL — ABNORMAL LOW (ref 70–99)
Glucose-Capillary: 83 mg/dL (ref 70–99)
Glucose-Capillary: 84 mg/dL (ref 70–99)
Glucose-Capillary: 230 mg/dL — ABNORMAL HIGH (ref 70–99)
Glucose-Capillary: 279 mg/dL — ABNORMAL HIGH (ref 70–99)

## 2020-07-31 MED ORDER — INSULIN GLARGINE 100 UNIT/ML ~~LOC~~ SOLN
90.0000 [IU] | Freq: Every morning | SUBCUTANEOUS | Status: DC
  Administered 2020-08-01 – 2020-08-02 (×2): 90 [IU] via SUBCUTANEOUS
  Filled 2020-07-31 (×3): qty 0.9

## 2020-07-31 NOTE — Progress Notes (Signed)
Physical Therapy Session Note  Patient Details  Name: Mitchell Lambert MRN: [id number] Date of Birth: 10/14/1950  Today's Date: 07/31/2020 PT Individual Time: 9450-3888 PT Individual Time Calculation (min): 40 min   Short Term Goals: Week 1:  PT Short Term Goal 1 (Week 1): Pt will perform bed mobility with supervision assist consisently PT Short Term Goal 2 (Week 1): Pt will Transfer to [facility] with min assist PT Short Term Goal 3 (Week 1): Pt will ambulate 145ft with min assist and LRAD PT Short Term Goal 4 (Week 1): pt will propell WC >173ft with supervision assist  Skilled Therapeutic Interventions/Progress Updates:   Patient received supine in bed, agreeable to PT. He denies pain and reports that R foot/ankle is feeling better. He was able to come sit edge of bed with supervision and transfer to wc via stand pivot with CGA. MD in/out for rounds. PT transporting patient in wc to therapy gym for time management. NMES applied to anterior tibs to further facilitate appropriate dorsiflexion and eversion at R ankle. Patient with very limited PROM noted. Patient does report h/o multiple R ankle inversion sprains, but given career as Physiological scientist, he did report appropriate/full ROM at R ankle prior to hospitalization. PT unable to passively achieve neutral dorsiflexion, but in standing patient able to achieve heel contact. Slight R genu recurvatum apparent. Patient may benefit from trial of heel lift in addition to AFO for gait training. Patient ambulating 46ft with RW and MinA. Decreased R dorsiflexion with increased R genu recurvatum. Patient returning to room in wc, transferring to bed with CGA via stand pivot. Bed alarm on, call light within reach.   Therapy Documentation Precautions:  Precautions Precautions: Fall Precaution Comments: diplopia, falls, R hemi Restrictions Weight Bearing Restrictions: No    Therapy/Group: Individual Therapy  Karoline Caldwell, PT, DPT,  CBIS  07/31/2020, 7:49 AM

## 2020-07-31 NOTE — Progress Notes (Signed)
Hypoglycemic Event  CBG: 57  Treatment: 120 mls of juice  Symptoms: none  Follow-up CBG: Time:0700 CBG Result:83  Possible Reasons for Event: unknown  Comments/MD notified:protocol followed   Consuello Masse

## 2020-07-31 NOTE — Progress Notes (Signed)
Physical Therapy Session Note  Patient Details  Name: Mitchell Lambert MRN: [id number] Date of Birth: 21-Nov-1950  Today's Date: 07/30/2020 PT Individual Time:  6759-1638  PT Individual Time Calculation: 60 min    Short Term Goals: Week 1:  PT Short Term Goal 1 (Week 1): Pt will perform bed mobility with supervision assist consisently PT Short Term Goal 2 (Week 1): Pt will Transfer to [facility] with min assist PT Short Term Goal 3 (Week 1): Pt will ambulate 174ft with min assist and LRAD PT Short Term Goal 4 (Week 1): pt will propell WC >17ft with supervision assist  Skilled Therapeutic Interventions/Progress Updates:  Patient seated upright on EOB on entrance to room. Patient alert and agreeable to PT session. Patient denied pain during session.  Therapeutic Activity: Transfers: Initial transfer performed as squat pivot bed-->w/c toward pt's L side requiring vc/ tc for setup of foot and hand positioning and for technique. Performs with CGA/ supervision and good control. Patient performed STS throughout session with hands on thighs to no AD and upright posture with CGA to RW. SPVT transfers performed with use of RW and with CGA/ Min A for balance and motor control of R knee extension. Provided verbal cues for R foot clearance, improving eccentric control of R knee into hyperextension, upright posture.  Performs toileting while standing at toilet with one instance of R knee buckle and later loss of eccentric control into hyperextension requiring hip strategy to correct with CGA from therapist.   Gait Training:  Patient ambulated 15 ft x3 using RW with CGA for straight paths and Min A during turns for balance and motor control of R knee extension. Demonstrated steppage gait for R. Provided vc/ tc for R foot clearance, improving eccentric control of R knee into hyperextension, upright posture and level gaze.  On return to room, pt's ankle brace has arrived, however lower portion of brace providing  support to foot ends at area where pt indicates pain (at level of navicular bone). Message to [employer] re: need to return brace and need for AFO.    Neuromuscular Re-ed: NMR facilitated during session with focus on muscle facilitation and control as well as standing balance. Pt guided in continuous reciprocation of BUE and BLE using NuStep L3 x 43min with focus on maintaining R knee alignment with 2nd toe as pt tends to ER at hip. Pt also guided in focus of R hand grasp to handle throughout. Pt is able to cerrect for short periods d/t weakness requiring intermittent vc and corrections. At end of session, pt guided in NMR for improved muscle activation of RLE ant tib. External facilitation provided while pt performs 8 sec Bil ankle DF with AROM on LLE and AAROM/ PROM to RLE simultaneously. Performed x20. Pt encouraged to use gait belt at R forefoot while long sitting in bed to continue to perform during evening. Focus is to allow motor plan to initiate in brain, and perform actively on LLE with active assist on RLE. Also guided in RLE LAQs and HS curls for improved muscle activation. Standing balance challenged with functional activity in room. NMR performed for improvements in motor control and coordination, balance, sequencing, judgement, and self confidence/ efficacy in performing all aspects of mobility at highest level of independence.   Patient supine in bed at end of session with brakes locked, bed alarm set, and all needs within reach.  Therapy Documentation Precautions:  Precautions Precautions: Fall Precaution Comments: diplopia, falls, R hemi Restrictions Weight Bearing Restrictions: No  Therapy/Group: Individual Therapy  Alger Simons PT, DPT 07/30/2020, 5:38 PM

## 2020-07-31 NOTE — Progress Notes (Signed)
Orthopedic Tech Progress Note Patient Details:  Mitchell Lambert 02-25-1951 [id number] Called in order to [facility] for an AFO CONSULT Patient ID: Mitchell Lambert, male   DOB: Jan 10, 1951, 70 y.o.   MRN: [id number]   Mitchell Lambert 07/31/2020, 11:01 AM

## 2020-07-31 NOTE — Progress Notes (Signed)
Patient ID: Mitchell Lambert, male   DOB: [birth year], 70 y.o.   MRN: [id number] Team Conference Report to Patient/Family  Team Conference discussion was reviewed with the patient and caregiver, including goals, any changes in plan of care and target discharge date.  Patient and caregiver express understanding and are in agreement.  The patient has a target discharge date of 08/07/20.  SW met with patient in room, called spouse at bedside. No answer, left VM. Patient ready to work on steps to determine if able. No additional questions. Left detailed VM with spouse. SW will cont to follow up.   Dyanne Iha 07/31/2020, 1:09 PM

## 2020-07-31 NOTE — Progress Notes (Signed)
PROGRESS NOTE   Subjective/Complaints: RIght ankle pain improved Hypoglycemia, on prednisone taper discussed with pt ROS: Patient denies CP, SOB, N/V/D  Objective:   No results found. Recent Labs    07/29/20 0435  WBC 10.4  HGB 13.5  HCT 38.7*  PLT 242   Recent Labs    07/29/20 0435  NA 136  K 4.7  CL 107  CO2 21*  GLUCOSE 304*  BUN 53*  CREATININE 2.16*  CALCIUM 9.1    Intake/Output Summary (Last 24 hours) at 07/31/2020 0947 Last data filed at 07/31/2020 6546 Gross per 24 hour  Intake 720 ml  Output --  Net 720 ml        Physical Exam: Vital Signs Blood pressure (!) 151/83, pulse (!) 50, temperature 97.8 F (36.6 C), temperature source Oral, resp. rate 16, height 5\' 11"  (1.803 m), weight 105.9 kg, SpO2 100 %.  General: No acute distress Mood and affect are appropriate Heart: Regular rate and rhythm no rubs murmurs or extra sounds Lungs: Clear to auscultation, breathing unlabored, no rales or wheezes Abdomen: Positive bowel sounds, soft nontender to palpation, nondistended Extremities: No clubbing, cyanosis, or edema Skin: No evidence of breakdown, no evidence of rash Musculoskeletal:  Cervical back: Normal range of motion.  Right ankle remains swollen and tender to touch and even slight rom.  RUE- biceps 4-/5, triceps 4+/5, grip 4/5, FA 2-/5 LUE 5/5 in same muscles RLE- HF 3-/5, KE 3-/5, KF 3/5, DF 0-1/5; PF 2-/5 Skin: General: Skin is warmand dry.  Neurological:  Mental Status: He is alertand oriented to person, place, and time.    Right facial droop without dysarthria.  Left ptosis with diplopia to left field and split vision in left lower field. Right hemiplegia with decrease in fine motor coordination.  RUE 4/5 prox to 2/5 distal. RLE 3/5 prox to 1-2/5 ADF/PF. Sensation intact       Assessment/Plan: 1. Functional deficits which require 3+ hours per day of interdisciplinary  therapy in a comprehensive inpatient rehab setting.  Physiatrist is providing close team supervision and 24 hour management of active medical problems listed below.  Physiatrist and rehab team continue to assess barriers to discharge/monitor patient progress toward functional and medical goals  Care Tool:  Bathing    Body parts bathed by patient: Right arm,Chest,Abdomen,Front perineal area,Left upper leg,Face   Body parts bathed by helper: Left arm,Buttocks,Left lower leg,Right lower leg     Bathing assist Assist Level: Minimal Assistance - Patient > 75%     Upper Body Dressing/Undressing Upper body dressing   What is the patient wearing?: Pull over shirt    Upper body assist Assist Level: Supervision/Verbal cueing    Lower Body Dressing/Undressing Lower body dressing      What is the patient wearing?: Pants     Lower body assist Assist for lower body dressing: Minimal Assistance - Patient > 75%     Toileting Toileting    Toileting assist Assist for toileting: Minimal Assistance - Patient > 75%     Transfers Chair/bed transfer  Transfers assist     Chair/bed transfer assist level: Moderate Assistance - Patient 50 - 74%  Locomotion Ambulation   Ambulation assist      Assist level: Moderate Assistance - Patient 50 - 74% Assistive device: No Device Max distance: 50   Walk 10 feet activity   Assist     Assist level: Moderate Assistance - Patient - 50 - 74% Assistive device: No Device   Walk 50 feet activity   Assist    Assist level: Moderate Assistance - Patient - 50 - 74%      Walk 150 feet activity   Assist Walk 150 feet activity did not occur: Safety/medical concerns         Walk 10 feet on uneven surface  activity   Assist Walk 10 feet on uneven surfaces activity did not occur: Safety/medical concerns         Wheelchair     Assist Will patient use wheelchair at discharge?: Yes (Per PT long term goals) Type of  Wheelchair: Manual    Wheelchair assist level: Minimal Assistance - Patient > 75% Max wheelchair distance: 150    Wheelchair 50 feet with 2 turns activity    Assist        Assist Level: Minimal Assistance - Patient > 75%   Wheelchair 150 feet activity     Assist      Assist Level: Minimal Assistance - Patient > 75%   Blood pressure (!) 151/83, pulse (!) 50, temperature 97.8 F (36.6 C), temperature source Oral, resp. rate 16, height 5\' 11"  (1.803 m), weight 105.9 kg, SpO2 100 %.    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 Antithrombotics: -DVT/anticoagulation:Pharmaceutical:Lovenox -antiplatelet therapy:  ASA/Brilinta x4 weeks followed by ASA alone. 3. Pain/RLE spasms/cramping:   -continue trial of baclofen 5mg  tid  -OA vs  gout flare right ankle. Pain is severe. Probably flared by existing OA, tone   - Pt on allopurinol chronically   - prednisone taper---will cont AFO should be sufficient    -reviewed xray of ankle which demonstrates significant OA   -hydrocodone for breakthrough pain control, tramadol for moderate pain ContinueTramadol as needed 4. Mood :team to providesupport -antipsychotic agents: N/AA 5. Neuropsych: This patientiscapable of making decisions on hisown behalf. 6. Skin/Wound Care:Routine pressure-relief measures 7. Fluids/Electrolytes/Nutrition:Monitor I's/O. Check electrolytes in a.m. 8.HTN: Monitor blood pressures twice daily.Avoid hypotension to avoid hypoperfusion. Continue Amlodipine and irbesartan have been on hold.  5/29 resume norvasc at 2.5mg   9.T2DM: Hgb  A1C- 8.8. Poorly controlled.  --Monitor blood sugarsac/hsand useSSIfor elevated blood sugars. -- Continue insulin glargine 100 unitsas per home regimen.    CBG (last 3)  Recent Labs    07/30/20 2124 07/31/20 0614 07/31/20 0658  GLUCAP 273* 57* 83    -lower CBG's while on prednisone starting to wean , reduce lantus to 90U 10.CKD IIIb: BaselineBUN/SCR 31/1.77-->33/2.02. Encourage fluid intake. --Patient educated on avoiding NSAIDs.  cont to increase will start IVF 11. ?History of gout vs kidney stone prophyllaxisContinue allopurinol. Monitor for flares.  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 Continue ASA, fish oil and Lipitor. --Offbisoprolol due to bradycardia. 13. Low protein stores: Will add prosource and Ensure Max for supplement.  14. Chronic hypomagnesemia: Changed magnesium to 750mg  HS gluconate (closer to home dose)  LOS: 6 days A FACE TO Mitchell Lambert 07/31/2020, 9:47 AM

## 2020-07-31 NOTE — Progress Notes (Signed)
Pt declining his PRAFO boot tonight.

## 2020-07-31 NOTE — Progress Notes (Signed)
Occupational Therapy Session Note  Patient Details  Name: Mitchell Lambert MRN: [id number] Date of Birth: [birth year]  Today's Date: 07/31/2020 OT Individual Time: 9355-2174 OT Individual Time Calculation (min): 54 min + 32 min   Short Term Goals: Week 1:  OT Short Term Goal 1 (Week 1): Pt will don pants with use of AE PRN with CGA. OT Short Term Goal 2 (Week 1): Pt will complete toilet transfer with CGA + LRAD. OT Short Term Goal 3 (Week 1): Pt will manage RUE during functional mobility with min VCs. OT Short Term Goal 4 (Week 1): Pt will tolerate standing ADL for > 5 min with CGA.  Skilled Therapeutic Interventions/Progress Updates:   Session 1: Pt received supine in bed, denies pain, agreeable to therapy. Completes bed mobility mod I. Stand-pivot to and from shower chair with CGA + use of grab bar. Bathed full-body close S, able to reach buttocks via lateral leans. Pt is ind in UBD, and today was able to doff/donn shorts/underwear with CGA for STS. Additionally, donned B socks with S + use of trash can as foot stool. Completed 1 TTB transfer in therapy apartment. Discussed where to purchase + cost. Pt verbalizes he intends to purchase one. Additionally, discussed bathroom set-up to reduce falls risk and to promote aging is place as he and his wife are currently in the process of purchasing/updating a new home. Stand-pivot back to bed with S.  Pt left sitting EOB with call bell in reach, and all immediate needs met.    Session 2: Pt received supine in bed, denies pain, agreeable to therapy. Completes bed mobility mod I. Stand-pivot to and from w/c with close S + no AD. Self-propelled w/c to gym with close S + increased time. Session focus on standing activity tolerance + RUE NMR/FMC in prep for improved ADL/IADL performance. Stood for ~20 min to play vertical checkers using RUE. Min A throughout to prevent shoulder hiking. Pt requires increased time to pick up pieces, but overall demonstrates  adequate ability to manipulate pieces, only dropping 2. Stand-pivot back to bed same manner as above. Returned to supine mod I. Pt left semi-reclined in bed with bed alarm engaged, call bell in reach, and all immediate needs met.    Therapy Documentation Precautions:  Precautions Precautions: Fall Precaution Comments: diplopia, falls, R hemi Restrictions Weight Bearing Restrictions: No Pain:  denies ADL: See Care Tool for more details.     Therapy/Group: Individual Therapy  Volanda Napoleon MS, OTR/L  07/31/2020, 6:54 AM

## 2020-07-31 NOTE — Patient Care Conference (Signed)
Inpatient RehabilitationTeam Conference and Plan of Care Update Date: 07/31/2020   Time: 10:00 AM    Patient Name: Mitchell Lambert      Medical Record Number: [id number]  Date of Birth: 07-Mar-1950 Sex: Male         Room/Bed: 4W13C/4W13C-01 Payor Info: Payor: MEDICARE / Plan: MEDICARE PART A AND B / Product Type: *No Product type* /    Admit Date/Time:  07/25/2020  1:52 PM  Primary Diagnosis:  Left pontine stroke [facility])  Hospital Problems: Principal Problem:   Left pontine stroke [facility]) Active Problems:   Acute right hemiparesis [facility])    Expected Discharge Date: Expected Discharge Date: 08/07/20  Team Members Present: Physician leading conference: Dr. Alysia Penna Care Coodinator Present: Dorien Chihuahua, RN, BSN, CRRN;Christina Flowood, [place] Nurse Present: Dorien Chihuahua, RN PT Present: Other (comment) Filbert Berthold, PT) OT Present: Other (comment) Providence Lanius, OT) SLP Present: Junius Argyle, SLP PPS Coordinator present : Gunnar Fusi, SLP     Current Status/Progress Goal Weekly Team Focus  Bowel/Bladder   Pt continent of B/B. LBM 07/30/2020 after dose of sorbitol  BM every 3 days or less  Assess B/B every shift   Swallow/Nutrition/ Hydration             ADL's   min A LBD, CGA LBB, toilet/shower transfer; S UBB, UBD, set-up self-feeding and grooming with L hand  S ADL/transfer goals; mod I seated grooming/self-feeding  RUE NMR, balance/self-care/transfer retraining, family/pt edu, activity tolerance, DME/AE education   Mobility   bed mobility = Mod I; transfers = CGA/ Min A; Gait = reaching up to 100' with Min A and RW - will require AFO for foot drop  Bed mobility = Mod I; transfers = SUP; Gait = SUP with RW up to 150', 6 steps to enter home with additional 12 steps to reach 2nd level  (P) RLE motor control and strengthening, gait training with increased R foot/ ankle support, stair negotiation training,   Communication             Safety/Cognition/ Behavioral  Observations            Pain   Rates head pain 6/10. Using PRN Tramadol and Hydrocodone  Pain rating of <3/10  Assess pain every shift and PRN   Skin   Skin intact  no new skin breakdown  Assess skin every shift and PRN     Discharge Planning:  Goal to d/c MOD I due to spouse being a realtor   Team Discussion: Ankle pain improved however will need adjusted spling or AFO for foot drop. CBGs fluctuated with prednisone taper for gouty arthritis and lantus dose reduced.  Patient on target to meet rehab goals: yes, currently CGA for lower body bathing and min assist for lower body dressing. Able to complete stand pivot transfer with CGA - Min assist ; more min assist to ambulate especially with turning. Supervision goals set for discharge and Mod I for self care.  *See Care Plan and progress notes for long and short-term goals.   Revisions to Treatment Plan:   Teaching Needs: Transfers, steps, medication, secondary stroke risk management, etc  Current Barriers to Discharge: Decreased caregiver support and Home enviroment access/layout  Possible Resolutions to Barriers: Family education [facility] follow up services     Medical Summary               I attest that I was present, lead the team conference, and concur with the assessment and plan  of the team.   Margarito Liner 07/31/2020, 2:07 PM

## 2020-08-01 LAB — GLUCOSE, CAPILLARY
Glucose-Capillary: 124 mg/dL — ABNORMAL HIGH (ref 70–99)
Glucose-Capillary: 78 mg/dL (ref 70–99)
Glucose-Capillary: 185 mg/dL — ABNORMAL HIGH (ref 70–99)
Glucose-Capillary: 222 mg/dL — ABNORMAL HIGH (ref 70–99)

## 2020-08-01 MED ORDER — PREDNISONE 5 MG PO TABS
10.0000 mg | ORAL_TABLET | Freq: Two times a day (BID) | ORAL | Status: DC
  Administered 2020-08-01 (×2): 10 mg via ORAL
  Filled 2020-08-01 (×3): qty 2

## 2020-08-01 NOTE — Progress Notes (Signed)
Occupational Therapy Session Note  Patient Details  Name: Mitchell Lambert MRN: [id number] Date of Birth: 1950-08-27  Today's Date: 08/01/2020 OT Individual Time: 6948-5462 OT Individual Time Calculation (min): 68 min    Short Term Goals: Week 1:  OT Short Term Goal 1 (Week 1): Pt will don pants with use of AE PRN with CGA. OT Short Term Goal 2 (Week 1): Pt will complete toilet transfer with CGA + LRAD. OT Short Term Goal 3 (Week 1): Pt will manage RUE during functional mobility with min VCs. OT Short Term Goal 4 (Week 1): Pt will tolerate standing ADL for > 5 min with CGA.  Skilled Therapeutic Interventions/Progress Updates:    Pt received semi-reclined in bed, denies pain, agreeable to therapy. Session focus on RUE NMR/FMC in prep for improved bimanual ADL/IADL performance. Competed bed mobility mod I and complete stand-pivot transfer with no AD throughout session with close S. Amb to sink first with RW to brush his teeth with CGA, completed oral care in standing close S. Self-propelled self in w/c to and from gym with distant S. Completed 1x15 scapular elevation and retraction with mirror for visual feedback, demonstrated improved symmetry between shoulders. Placed and removed level 1 - 4 resistive clothes pin on vertical bar above shoulder height using pincer grasp. Req min tactile cues to prevent trunk compensation. Additionally provided printed HEP of theraputty exercises completed in previous sessions, reviewed and practiced 4 new exercises including digit extension/abduction/adduction + pinch and pull exercise. Finally, practiced self-feeding with RUE by scooping beads from bowl. Demonstrates ~25% spillage and req mod VCs to prevent cervical flexion to compensate. Overall demonstrates improvement in ability to bring spoon to mouth than at eval. Trialed built up handle, but pt reports feeling comfortable without it.   Pt left seated EOB with bed alarm engaged, call bell in reach, and all  immediate needs met.    Therapy Documentation Precautions:  Precautions Precautions: Fall Precaution Comments: diplopia, falls, R hemi Restrictions Weight Bearing Restrictions: No Pain:  denies ADL: See Care Tool for more details.   Therapy/Group: Individual Therapy  Volanda Napoleon MS, OTR/L  08/01/2020, 6:48 AM

## 2020-08-01 NOTE — Progress Notes (Signed)
PROGRESS NOTE   Subjective/Complaints: Right foot/ankle doing ok  ROS: Patient denies CP, SOB, N/V/D  Objective:   No results found. No results for input(s): WBC, HGB, HCT, PLT in the last 72 hours. No results for input(s): NA, K, CL, CO2, GLUCOSE, BUN, CREATININE, CALCIUM in the last 72 hours.  Intake/Output Summary (Last 24 hours) at 08/01/2020 0841 Last data filed at 08/01/2020 0700 Gross per 24 hour  Intake 600 ml  Output 1400 ml  Net -800 ml        Physical Exam: Vital Signs Blood pressure (!) 159/90, pulse (!) 52, temperature 97.9 F (36.6 C), temperature source Oral, resp. rate 18, height 5\' 11"  (1.803 m), weight 105.9 kg, SpO2 99 %.  General: No acute distress Mood and affect are appropriate Heart: Regular rate and rhythm no rubs murmurs or extra sounds Lungs: Clear to auscultation, breathing unlabored, no rales or wheezes Abdomen: Positive bowel sounds, soft nontender to palpation, nondistended Extremities: No clubbing, cyanosis, or edema Skin: No evidence of breakdown, no evidence of rash  Musculoskeletal:  Cervical back: Normal range of motion.  Right ankle remains swollen and tender to touch and even slight rom.  RUE- biceps 4-/5, triceps 4+/5, grip 4/5, FA 2-/5 LUE 5/5 in same muscles RLE- HF 3-/5, KE 3-/5, KF 3/5, DF 0-1/5; PF 2-/5 Skin: General: Skin is warmand dry.  Neurological:  Mental Status: He is alertand oriented to person, place, and time.    Right facial droop without dysarthria.  Left ptosis with diplopia to left field and split vision in left lower field. Right hemiplegia with decrease in fine motor coordination.  RUE 4/5 prox to 2/5 distal. RLE 3/5 prox to 1-2/5 ADF/PF. Sensation intact       Assessment/Plan: 1. Functional deficits which require 3+ hours per day of interdisciplinary therapy in a comprehensive inpatient rehab setting.  Physiatrist is providing close  team supervision and 24 hour management of active medical problems listed below.  Physiatrist and rehab team continue to assess barriers to discharge/monitor patient progress toward functional and medical goals  Care Tool:  Bathing    Body parts bathed by patient: Right arm,Chest,Abdomen,Front perineal area,Left upper leg,Face,Left arm,Right upper leg,Buttocks,Right lower leg,Left lower leg   Body parts bathed by helper: Left arm,Buttocks,Left lower leg,Right lower leg     Bathing assist Assist Level: Supervision/Verbal cueing     Upper Body Dressing/Undressing Upper body dressing   What is the patient wearing?: Pull over shirt    Upper body assist Assist Level: Independent    Lower Body Dressing/Undressing Lower body dressing      What is the patient wearing?: Pants     Lower body assist Assist for lower body dressing: Contact Guard/Touching assist     Toileting Toileting    Toileting assist Assist for toileting: Minimal Assistance - Patient > 75%     Transfers Chair/bed transfer  Transfers assist     Chair/bed transfer assist level: Contact Guard/Touching assist     Locomotion Ambulation   Ambulation assist      Assist level: Minimal Assistance - Patient > 75% Assistive device: Walker-rolling Max distance: 155 ft   Walk 10 feet activity  Assist     Assist level: Contact Guard/Touching assist Assistive device: Walker-rolling   Walk 50 feet activity   Assist    Assist level: Contact Guard/Touching assist Assistive device: Walker-rolling    Walk 150 feet activity   Assist Walk 150 feet activity did not occur: Safety/medical concerns  Assist level: Minimal Assistance - Patient > 75% Assistive device: Walker-rolling    Walk 10 feet on uneven surface  activity   Assist Walk 10 feet on uneven surfaces activity did not occur: Safety/medical concerns         Wheelchair     Assist Will patient use wheelchair at discharge?:  Yes (Per PT long term goals) Type of Wheelchair: Manual    Wheelchair assist level: Supervision/Verbal cueing Max wheelchair distance: 150    Wheelchair 50 feet with 2 turns activity    Assist        Assist Level: Contact Guard/Touching assist   Wheelchair 150 feet activity     Assist      Assist Level: Supervision/Verbal cueing   Blood pressure (!) 159/90, pulse (!) 52, temperature 97.9 F (36.6 C), temperature source Oral, resp. rate 18, height 5\' 11"  (1.803 m), weight 105.9 kg, SpO2 99 %.    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 Antithrombotics: -DVT/anticoagulation:Pharmaceutical:Lovenox -antiplatelet therapy:  ASA/Brilinta x4 weeks followed by ASA alone. 3. Pain/RLE spasms/cramping:   -continue trial of baclofen 5mg  tid  -OA vs  gout flare right ankle. Pain is severe. Probably flared by existing OA, tone   - Pt on allopurinol chronically   - prednisone taper---will reduce to 10mg  BID AFO should be sufficient    -reviewed xray of ankle which demonstrates significant OA   -hydrocodone for breakthrough pain control, tramadol for moderate pain ContinueTramadol as needed 4. Mood :team to providesupport -antipsychotic agents: N/AA 5. Neuropsych: This patientiscapable of making decisions on hisown behalf. 6. Skin/Wound Care:Routine pressure-relief measures 7. Fluids/Electrolytes/Nutrition:Monitor I's/O. Check electrolytes in a.m. 8.HTN: Monitor blood pressures twice daily.Avoid hypotension to avoid hypoperfusion. Continue Amlodipine and irbesartan have been on hold.  5/29 resume norvasc at 2.5mg   9.T2DM: Hgb A1C- 8.8. Poorly  controlled.  --Monitor blood sugarsac/hsand useSSIfor elevated blood sugars. -- Continue insulin glargine 100 unitsas per home regimen.    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 Encourage fluid intake. --Patient educated on avoiding NSAIDs.  cont to increase will start IVF 11. ?History of gout vs kidney stone prophyllaxisContinue allopurinol. Monitor for flares.  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 Continue ASA, fish oil and Lipitor. --Offbisoprolol due to bradycardia. 13. Low protein stores: Will add prosource and Ensure Max for supplement.  14. Chronic hypomagnesemia: Changed magnesium to 750mg  HS gluconate (closer to home dose)     LOS: 7 days A FACE TO FACE EVALUATION WAS PERFORMED  Charlett Blake 08/01/2020, 8:41 AM

## 2020-08-01 NOTE — Progress Notes (Signed)
Physical Therapy Session Note  Patient Details  Name: DAYVIN ABER MRN: [id number] Date of Birth: [birth year]  Today's Date: 08/01/2020 PT Individual Time: 0805-0900 1600-1700 PT Individual Time Calculation (min): 55 min and 60 min   Short Term Goals: Week 1:  PT Short Term Goal 1 (Week 1): Pt will perform bed mobility with supervision assist consisently PT Short Term Goal 2 (Week 1): Pt will Transfer to [facility] with min assist PT Short Term Goal 3 (Week 1): Pt will ambulate 110f with min assist and LRAD PT Short Term Goal 4 (Week 1): pt will propell WC >1568fwith supervision assist  Skilled Therapeutic Interventions/Progress Updates:  Session 1  Pt received sitting EOB bed and agreeable to PT. [facility]. Pt transported to sink to place contact in eye with supervision assist with cues for awareness and RLE.   Transfers with RW with supervision assist throughout session for sit<>stand and stand pivot.   WC mobility with supervision assist and BUE propulsion [id number] for doorway management only.   Gait training x [id number] supervision assist and RW, mild GR noted as well as foot drop on the RLE. No ankle pain throughout gait training   Stair management training 4 steps x 2 with min assist. BUE support on bil rails and then L side rail to simulate home environement. Min-mod cues for step to gait pattern and use of UE for support.   Nustep reciprocal movement training and AAROM on the R ankle x 10 min with cues for symmetry R and L intermittently.   Patient returned to room and left sitting EOB  with call bell in reach and all needs met.    Session 2.   Pt received supine in bed and agreeable to PT. Supine>sit transfer without assist. KT tape to RLE for edema management. PT assisted pt to don RAFO and shoes. Gait training with RW and AFO 2x 120ft46f 180ft 24fsupervision assist for safety with cues for step height on the R and improved posture.  PT placed heel wedge under AFO on send bout with only slight improvement in GR control.   Pt returned to room and performed ambulatory transfer to bed with RW and supervision assist.. Sit>supine completed without assist, and left supine in bed with call bell in reach and all needs met.       Therapy Documentation Precautions:  Precautions Precautions: Fall Precaution Comments: diplopia, falls, R hemi Restrictions Weight Bearing Restrictions: No  Pain: denies    Therapy/Group: Individual Therapy  AustinLorie Phenix022, 9:05 AM

## 2020-08-01 NOTE — Progress Notes (Addendum)
Physical Therapy Session Note  Patient Details  Name: Mitchell Lambert MRN: [id number] Date of Birth: [birth year]  Today's Date: 07/31/2020 PT Individual Time:  0802-0905 PT Individual Time Calculation: 63 min   Short Term Goals: Week 1:  PT Short Term Goal 1 (Week 1): Pt will perform bed mobility with supervision assist consisently PT Short Term Goal 2 (Week 1): Pt will Transfer to [facility] with min assist PT Short Term Goal 3 (Week 1): Pt will ambulate 185ft with min assist and LRAD PT Short Term Goal 4 (Week 1): pt will propell WC >140ft with supervision assist  Skilled Therapeutic Interventions/Progress Updates:  Patient supine in bed on entrance to room. Patient alert and agreeable to PT session. Patient denied pain during session. Discussed with pt during session need for HHPT rather than OP PT upon initial d/c home d/t significant amount of stairs to enter/ exit home and mobilize in home safely. Pt also shown AFO and pt's need for laced shoes with rubber soles and potential need for wide width to accommodate R foot/ ankle edema along with AFO. Pt demos understanding.    Therapeutic Activity: Transfers: Patient performed STS from EOB and requires Min A for initial balance d/t attempt without conscious effort since pt has just recently woken up. Pt reminded that during recovery with decreased response from R hemibody, all movements have to be performed with increased conscious focus for improved safety and technique. STS progress to supervision throughout session. SPVT transfers performed using RW  with CGA. Provided verbal cues for R foot clearance and positioning/ technique.  Performs toileting while standing at toilet with good balance noted. Pt stands at sink to comb hair. D/t still feeling fatigued from recent waking, pt prefers to perform brushing of teeth and washing face while seated at sink. Performs all with Mod I.   Gait Training:  Patient ambulated 155 feet x1 and short distances in  room using RW with CGA. Demonstrated steppage gait with conscious foot clearance of RLE. Some improvement noted in control of R knee to prevent hyperextension. This control decreases with fatigue in increased distance. Provided vc/ tc for level gaze and upright posture.  Wheelchair Mobility:  Patient propelled wheelchair 150 feet with supervision using BUE including 90 degree turns. Provided vc for technique and increasing effort with RUE.  Neuromuscular Re-ed: NMR facilitated during session with focus on eccentric control of RLE quad and hamstring muscle groups for improved knee motor control. Pt guided in standing minisquats with focus on preventing R knee movement into hyper extension/ genu recurvatum. Seated LAQ with holds in knee extension, hold with added resistance, and slow eccentric return with added resistance. Seated hamstring curls with holds max AROM knee flexion, holds with added resistance and slow eccentric return into extension with resistance. NMR performed for improvements in motor control and coordination, balance, sequencing, judgement, and self confidence/ efficacy in performing all aspects of mobility at highest level of independence.   Patient supine in bed at end of session with bed alarm set, and all needs within reach.   Therapy Documentation Precautions:  Precautions Precautions: Fall Precaution Comments: diplopia, falls, R hemi Restrictions Weight Bearing Restrictions: No  Therapy/Group: Individual Therapy  Alger Simons PT, DPT 07/31/2020, 7:03 PM

## 2020-08-01 NOTE — Progress Notes (Signed)
Patient ID: Mitchell Lambert, male   DOB: 1950/05/28, 70 y.o.   MRN: [id number] Follow up with the patient regarding pending discharge and preparation/comfort with the plan. Patient noted  No questions regarding medications or insulin administration for discharge. He and wife are concerned with care needed to get into home and wife being able to manage care needs at discharge especially with steps into the home and main bedroom upstairs. Noted he is able to stay on the main floor once inside the home; sleep on the couch,etc. Continue to follow along to discharge to review educational needs once family education completed for discharge. Margarito Liner

## 2020-08-01 NOTE — Progress Notes (Signed)
Occupational Therapy Session Note  Patient Details  Name: Mitchell Lambert MRN: [id number] Date of Birth: Jun 27, 1950  Today's Date: 08/01/2020 OT Individual Time: 8933-8826 OT Individual Time Calculation (min): 39 min    Short Term Goals: Week 1:  OT Short Term Goal 1 (Week 1): Pt will don pants with use of AE PRN with CGA. OT Short Term Goal 2 (Week 1): Pt will complete toilet transfer with CGA + LRAD. OT Short Term Goal 3 (Week 1): Pt will manage RUE during functional mobility with min VCs. OT Short Term Goal 4 (Week 1): Pt will tolerate standing ADL for > 5 min with CGA.  Skilled Therapeutic Interventions/Progress Updates:    Pt received in room with family at bedside and consented to OT tx. Pt instructed in toileting task with CGA-close SUP to walk into bathroom with RW. Pt stood to urinate with distant supervision with grab bars available for support. Pt then taken down to gym to instruct in large block activity for B hand manipulation and small tack activity to increase R side fine motor control and coordination while standing at elevated table. Pt req 3 seated rest breaks during activity due to fatigue. After tx, pt helped back to bed with CGA and left with bed alarm on and all needs met.   Therapy Documentation Precautions:  Precautions Precautions: Fall Precaution Comments: diplopia, falls, R hemi Restrictions Weight Bearing Restrictions: No   Pain: none     Therapy/Group: Individual Therapy  Jeraline Marcinek 08/01/2020, 1:45 PM

## 2020-08-02 LAB — GLUCOSE, CAPILLARY
Glucose-Capillary: 149 mg/dL — ABNORMAL HIGH (ref 70–99)
Glucose-Capillary: 168 mg/dL — ABNORMAL HIGH (ref 70–99)
Glucose-Capillary: 155 mg/dL — ABNORMAL HIGH (ref 70–99)
Glucose-Capillary: 157 mg/dL — ABNORMAL HIGH (ref 70–99)

## 2020-08-02 MED ORDER — PREDNISONE 5 MG PO TABS
5.0000 mg | ORAL_TABLET | Freq: Two times a day (BID) | ORAL | Status: DC
  Administered 2020-08-03: 5 mg via ORAL
  Filled 2020-08-02: qty 1

## 2020-08-02 NOTE — Progress Notes (Signed)
Occupational Therapy Session Note  Patient Details  Name: Mitchell Lambert MRN: [id number] Date of Birth: 07-Jul-1950  Today's Date: 08/02/2020 OT Individual Time: 5397-6734 OT Individual Time Calculation (min): 58 min    Short Term Goals: Week 1:  OT Short Term Goal 1 (Week 1): Pt will don pants with use of AE PRN with CGA. OT Short Term Goal 2 (Week 1): Pt will complete toilet transfer with CGA + LRAD. OT Short Term Goal 3 (Week 1): Pt will manage RUE during functional mobility with min VCs. OT Short Term Goal 4 (Week 1): Pt will tolerate standing ADL for > 5 min with CGA.  Skilled Therapeutic Interventions/Progress Updates:    Pt received in room seated EOB and consented to OT tx. Session focused on morning ADL routine including bathing, dressing, grooming and functional transfers and mobility. Pt retrieved clothing items from dresser with RW and CGA. Pt then requested to sit for oral care sink side, completed with mod I and walked with RW and CGA into bathroom for shower. Pt showered seated with distant supervision and time for proficiency. Pt becomes fatigued quickly and requires increased time for ADLs. Pt required assist for R sock, instructed in use of sock aide to don socks with good carryover. After ADLs, pt instructed in theraputty HEP with bead retrieval with pink theraputty to increase [name] for ADLs. After tx, pt left sitting EOB with alarm on and all needs met.  Therapy Documentation Precautions:  Precautions Precautions: Fall Precaution Comments: diplopia, falls, R hemi Restrictions Weight Bearing Restrictions: No   Pain: none     Therapy/Group: Individual Therapy  Rylei Codispoti 08/02/2020, 9:32 AM

## 2020-08-02 NOTE — Progress Notes (Signed)
Occupational Therapy Session Note  Patient Details  Name: Mitchell Lambert MRN: [id number] Date of Birth: 1950-04-21  Today's Date: 08/02/2020 OT Individual Time: 1335-1400 OT Individual Time Calculation (min): 25 min    Short Term Goals: Week 1:  OT Short Term Goal 1 (Week 1): Pt will don pants with use of AE PRN with CGA. OT Short Term Goal 1 - Progress (Week 1): Met OT Short Term Goal 2 (Week 1): Pt will complete toilet transfer with CGA + LRAD. OT Short Term Goal 2 - Progress (Week 1): Met OT Short Term Goal 3 (Week 1): Pt will manage RUE during functional mobility with min VCs. OT Short Term Goal 3 - Progress (Week 1): Met OT Short Term Goal 4 (Week 1): Pt will tolerate standing ADL for > 5 min with CGA. OT Short Term Goal 4 - Progress (Week 1): Met  Skilled Therapeutic Interventions/Progress Updates:    Pt participated in rhythmic drumming group. Pain no reported during session Focus of group on BUE coordination, strengthening, endurance, timing/control, activity tolerance, and social participation and engagement. Pt performs session from seated position for energy conservation. Skilled interventions included slowing down movements to beat of music to access Rhythm as NMR and cross body movement to improve activation of B sides of brain. Warm up performed prior to exercises and UB stretching completed at end of group with demo from OT. Pt able to select preferred song to share with group. Returned pt to room at end of session. Exited session with pt seated in EOB, exit alarm on and call light in reach   Session 2: 1335-1400 25 min Pt received in EOB with call light on. No pain reported, but pt requesting to go to toilet. Pt completes functional mobility with quad cane to/from bathroom with CGA and uses grab bar for steadying during clothing management. Pt able to void bowel and bladder on toilet. Pt received new AFO during session. Pt and OT practice putting on shoes/AFO with S for socks  with RLE elevated on stool and A to fasten L shoe. Direct handoff to OT for next session.  Therapy Documentation Precautions:  Precautions Precautions: Fall Precaution Comments: diplopia, falls, R hemi Restrictions Weight Bearing Restrictions: No General:   Vital Signs: Therapy Vitals Temp: 98.2 F (36.8 C) Pulse Rate: (!) 54 Resp: 16 BP: (!) 166/95 Patient Position (if appropriate): Lying Oxygen Therapy SpO2: 100 % O2 Device: Room Air Pain:   ADL: ADL Eating: Minimal assistance Where Assessed-Eating: Wheelchair Grooming: Minimal assistance Where Assessed-Grooming: Sitting at sink Upper Body Bathing: Minimal assistance Where Assessed-Upper Body Bathing: Wheelchair Lower Body Bathing: Minimal assistance Where Assessed-Lower Body Bathing: Edge of bed Upper Body Dressing: Minimal assistance Where Assessed-Upper Body Dressing: Edge of bed Lower Body Dressing: Minimal assistance Where Assessed-Lower Body Dressing: Edge of bed Toileting: Minimal assistance Where Assessed-Toileting: Glass blower/designer: Psychiatric nurse Method: Arts development officer: Energy manager: Not assessed Social research officer, government: Not assessed Vision   Perception    Praxis   Exercises:   Other Treatments:     Therapy/Group: Individual Therapy and Group Therapy  Tonny Branch 08/02/2020, 6:52 AM

## 2020-08-02 NOTE — Progress Notes (Signed)
PROGRESS NOTE   Subjective/Complaints: Able to use R UE to place contacts, no sig pain RLE ROS: Patient denies CP, SOB, N/V/D  Objective:   No results found. No results for input(s): WBC, HGB, HCT, PLT in the last 72 hours. No results for input(s): NA, K, CL, CO2, GLUCOSE, BUN, CREATININE, CALCIUM in the last 72 hours.  Intake/Output Summary (Last 24 hours) at 08/02/2020 0840 Last data filed at 08/02/2020 0824 Gross per 24 hour  Intake 1440 ml  Output 550 ml  Net 890 ml        Physical Exam: Vital Signs Blood pressure (!) 166/95, pulse (!) 54, temperature 98.2 F (36.8 C), resp. rate 16, height 5\' 11"  (1.803 m), weight 103 kg, SpO2 100 %.   General: No acute distress Mood and affect are appropriate Heart: Regular rate and rhythm no rubs murmurs or extra sounds Lungs: Clear to auscultation, breathing unlabored, no rales or wheezes Abdomen: Positive bowel sounds, soft nontender to palpation, nondistended Extremities: No clubbing, cyanosis, or edema Skin: No evidence of breakdown, no evidence of rash Musculoskeletal:  Cervical back: Normal range of motion.  Right ankle remains swollen and tender to touch and even slight rom.  RUE- biceps 4-/5, triceps 4+/5, grip 4/5, FA 2-/5 LUE 5/5 in same muscles RLE- HF 3-/5, KE 3-/5, KF 3/5, DF 0-1/5; PF 2-/5 Skin: General: Skin is warmand dry.  Neurological:  Mental Status: He is alertand oriented to person, place, and time.    Right facial droop without dysarthria.  Left ptosis with diplopia to left field and split vision in left lower field. Right hemiplegia with decrease in fine motor coordination.  RUE 4/5 prox to 2/5 distal. RLE 3/5 prox to 1-2/5 ADF/PF. Sensation intact       Assessment/Plan: 1. Functional deficits which require 3+ hours per day of interdisciplinary therapy in a comprehensive inpatient rehab setting.  Physiatrist is providing close team  supervision and 24 hour management of active medical problems listed below.  Physiatrist and rehab team continue to assess barriers to discharge/monitor patient progress toward functional and medical goals  Care Tool:  Bathing    Body parts bathed by patient: Right arm,Chest,Abdomen,Front perineal area,Left upper leg,Face,Left arm,Right upper leg,Buttocks,Right lower leg,Left lower leg   Body parts bathed by helper: Left arm,Buttocks,Left lower leg,Right lower leg     Bathing assist Assist Level: Supervision/Verbal cueing     Upper Body Dressing/Undressing Upper body dressing   What is the patient wearing?: Pull over shirt    Upper body assist Assist Level: Independent    Lower Body Dressing/Undressing Lower body dressing      What is the patient wearing?: Pants     Lower body assist Assist for lower body dressing: Contact Guard/Touching assist     Toileting Toileting    Toileting assist Assist for toileting: Minimal Assistance - Patient > 75%     Transfers Chair/bed transfer  Transfers assist     Chair/bed transfer assist level: Contact Guard/Touching assist     Locomotion Ambulation   Ambulation assist      Assist level: Minimal Assistance - Patient > 75% Assistive device: Walker-rolling Max distance: 155 ft   Walk  10 feet activity   Assist     Assist level: Contact Guard/Touching assist Assistive device: Walker-rolling   Walk 50 feet activity   Assist    Assist level: Contact Guard/Touching assist Assistive device: Walker-rolling    Walk 150 feet activity   Assist Walk 150 feet activity did not occur: Safety/medical concerns  Assist level: Minimal Assistance - Patient > 75% Assistive device: Walker-rolling    Walk 10 feet on uneven surface  activity   Assist Walk 10 feet on uneven surfaces activity did not occur: Safety/medical concerns         Wheelchair     Assist Will patient use wheelchair at discharge?: Yes  (Per PT long term goals) Type of Wheelchair: Manual    Wheelchair assist level: Supervision/Verbal cueing Max wheelchair distance: 150    Wheelchair 50 feet with 2 turns activity    Assist        Assist Level: Contact Guard/Touching assist   Wheelchair 150 feet activity     Assist      Assist Level: Supervision/Verbal cueing   Blood pressure (!) 166/95, pulse (!) 54, temperature 98.2 F (36.8 C), resp. rate 16, height 5\' 11"  (1.803 m), weight 103 kg, SpO2 100 %.    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 Antithrombotics: -DVT/anticoagulation:Pharmaceutical:Lovenox -antiplatelet therapy:  ASA/Brilinta x4 weeks followed by ASA alone. 3. Pain/RLE spasms/cramping:   -continue trial of baclofen 5mg  tid  -OA vs  gout flare right ankle. Pain is severe. Probably flared by existing OA, tone   - Pt on allopurinol chronically   - prednisone taper---will reduce to 5mg  BID starting 6/4 AFO should be sufficient    -reviewed xray of ankle which demonstrates significant OA   -hydrocodone for breakthrough pain control, tramadol for moderate pain ContinueTramadol as needed 4. Mood :team to providesupport -antipsychotic agents: N/AA 5. Neuropsych: This patientiscapable of making decisions on hisown behalf. 6. Skin/Wound Care:Routine pressure-relief measures 7. Fluids/Electrolytes/Nutrition:Monitor I's/O. Check electrolytes in a.m. 8.HTN: Monitor blood pressures twice daily.Avoid hypotension to avoid hypoperfusion. Continue Amlodipine and irbesartan have been on hold.  5/29 resume norvasc at 2.5mg   9.T2DM: Hgb A1C- 8.8. Poorly controlled.   --Monitor blood sugarsac/hsand useSSIfor elevated blood sugars. -- Continue insulin glargine 100 unitsas per home regimen.    CBG (last 3)  Recent Labs    08/01/20 1650 08/01/20 2043 08/02/20 0644  GLUCAP 185* 222* 149*    -lower CBG's while on prednisone down to 10mg  BID  , reduce lantus to 90U- will reduce to 5mg  BID in am  10.CKD IIIb: BaselineBUN/SCR 31/1.77-->33/2.02. Encourage fluid intake. --Patient educated on avoiding NSAIDs.  cont to increase will start IVF 11. ?History of gout vs kidney stone prophyllaxisContinue allopurinol. Monitor for flares.  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 Continue ASA, fish oil and Lipitor. --Offbisoprolol due to bradycardia. 13. Low protein stores: Will add prosource and Ensure Max for supplement.  14. Chronic hypomagnesemia: Changed magnesium to 750mg  HS gluconate (closer to home dose)     LOS: 8 days A FACE TO FACE EVALUATION WAS PERFORMED  Charlett Blake 08/02/2020, 8:40 AM

## 2020-08-02 NOTE — Progress Notes (Signed)
Occupational Therapy Weekly Progress Note  Patient Details  Name: Mitchell Lambert MRN: [id number] Date of Birth: 1950-09-16  Beginning of progress report period: Jul 26, 2020 End of progress report period: August 02, 2020  Today's Date: 08/02/2020 OT Individual Time: 1400-1436 OT Individual Time Calculation (min): 36 min    Patient has met 4 of 4 short term goals.  Pt has made great progress this reporting period. He demonstrates improvement in dynamic standing balance, functional transfers, and improved functional use of RUE during bimanual ADL tasks. He is currently completing toilet/shower transfers and LBD with CGA with use of RW. He cont to be primarily limited by diplopia.   Patient continues to demonstrate the following deficits: muscle weakness, decreased cardiorespiratoy endurance, impaired timing and sequencing, abnormal tone, unbalanced muscle activation, decreased coordination and decreased motor planning, diplopia and decreased sitting balance, decreased standing balance, decreased postural control and hemiplegia and therefore will continue to benefit from skilled OT intervention to enhance overall performance with BADL, iADL and Reduce care partner burden.  Patient progressing toward long term goals..  Continue plan of care.  OT Short Term Goals Week 2:  OT Short Term Goal 1 (Week 2): STG = LTG 2/2 ELOS  Skilled Therapeutic Interventions/Progress Updates:    Pt received seated in chair with OT present, denies pain, agreeable to therapy. Session focus on RUE NMR and ocular motor skills in prep for improved ADL/IADL performance. Pt donned/doffed B shoes and socks with overall min A to don R AFO + use of trash can as foot stool. Amb to therapy gym with RW + CGA. Standing at wall, completed 1x20 shoulder flexion, external/internal rotation, and wall-pushups. Reported moderate fatigue after activity. With [employer], completed 1x3 of near, mid, and distant focal point convergence  exercises. W/c transport back to room 2/2 time + amb transfer with RW back to bed with CGA. Mod I for bed mobility.   Pt left semi-reclined in bed with bed alarm engaged, call bell in reach, and all immediate needs met.    Therapy Documentation Precautions:  Precautions Precautions: Fall Precaution Comments: diplopia, falls, R hemi Restrictions Weight Bearing Restrictions: No Pain:  denies ADL: See Care Tool for more details.  Therapy/Group: Individual Therapy  Volanda Napoleon MS, OTR/L  08/02/2020, 6:47 AM

## 2020-08-02 NOTE — Progress Notes (Signed)
Physical Therapy Weekly Progress Note  Patient Details  Name: Mitchell Lambert MRN: [id number] Date of Birth: 01/03/1951  Beginning of progress report period: Jul 26, 2020 End of progress report period: August 02, 2020  Today's Date: 08/02/2020 PT Individual Time: 9030-0923 PT Individual Time Calculation (min): 55 min   Patient has met 4 of 4 short term goals.  Pt is making steady progress towards LTG of supervision assist overall for all mobility. Currently, pt is functioning at supervision assist-mod I for bed mobility, min assist for stand pivot transfer and gait with RW and AFO, min assist for stair management training up to 8 steps with cues for step to gait pattern.   Patient continues to demonstrate the following deficits muscle weakness, muscle joint tightness and muscle paralysis, decreased cardiorespiratoy endurance, decreased visual acuity, decreased visual perceptual skills and decreased visual motor skills, decreased attention to right and decreased standing balance, decreased postural control, hemiplegia and decreased balance strategies and therefore will continue to benefit from skilled PT intervention to increase functional independence with mobility.  Patient progressing toward long term goals..  Continue plan of care.  PT Short Term Goals Week 1:  PT Short Term Goal 1 (Week 1): Pt will perform bed mobility with supervision assist consisently PT Short Term Goal 1 - Progress (Week 1): Met PT Short Term Goal 2 (Week 1): Pt will Transfer to [facility] with min assist PT Short Term Goal 2 - Progress (Week 1): Met PT Short Term Goal 3 (Week 1): Pt will ambulate 171f with min assist and LRAD PT Short Term Goal 3 - Progress (Week 1): Met PT Short Term Goal 4 (Week 1): pt will propell WC >1556fwith supervision assist PT Short Term Goal 4 - Progress (Week 1): Met Week 2:  PT Short Term Goal 1 (Week 2): ST=LT due to ELOS  Skilled Therapeutic Interventions/Progress Updates:      Pt received  supine in bed and agreeable to PT. Supine>sit transfer without assist and or cues. Sitting EOB PT donned Bil shoes and R AFO, heel wedge place on AFO to allow for mild contracture of the R ankle.   Gait training in room to attempt urination at toilet with RW and supervision assist. Inserting contact sitting at sink with supervision assist.   WC mobility with BUE propulsion x 15072fith supervision assist for cues to improve safety with turns and doorway mangement.   Gait training with QC 2 x [id number] min assist with cues for step to gait pattern and improved trunkal extension. And with RW x [id number] supervision assist fading to min assist due to fatigue causing foot drag on the RLE.   Supine and prone NMR:  HS curl x 12 BLE,  Heel slides x 10 BLE.  Bridges x 10 BLE  SAQ x 12 SLR x 10.  Cues for full ROM and min assist from PT to prevent compensatory movements on the RLE.   Pt returned to room and performed stand pivot transfer to bed with RW and supervision assist. Sit>supine completed without assist, and left supine in bed with call bell in reach and all needs met.       Therapy Documentation Precautions:  Precautions Precautions: Fall Precaution Comments: diplopia, falls, R hemi Restrictions Weight Bearing Restrictions: No Pain: denies  Therapy/Group: Individual Therapy  AustiLorie Phenix2022, 9:06 AM

## 2020-08-03 LAB — GLUCOSE, CAPILLARY
Glucose-Capillary: 46 mg/dL — ABNORMAL LOW (ref 70–99)
Glucose-Capillary: 55 mg/dL — ABNORMAL LOW (ref 70–99)
Glucose-Capillary: 91 mg/dL (ref 70–99)
Glucose-Capillary: 255 mg/dL — ABNORMAL HIGH (ref 70–99)
Glucose-Capillary: 230 mg/dL — ABNORMAL HIGH (ref 70–99)
Glucose-Capillary: 187 mg/dL — ABNORMAL HIGH (ref 70–99)

## 2020-08-03 MED ORDER — PREDNISONE 5 MG PO TABS
2.5000 mg | ORAL_TABLET | Freq: Two times a day (BID) | ORAL | Status: DC
  Administered 2020-08-03 – 2020-08-04 (×2): 2.5 mg via ORAL
  Filled 2020-08-03 (×2): qty 1

## 2020-08-03 MED ORDER — INSULIN GLARGINE 100 UNIT/ML ~~LOC~~ SOLN
45.0000 [IU] | Freq: Once | SUBCUTANEOUS | Status: AC
  Administered 2020-08-03: 45 [IU] via SUBCUTANEOUS
  Filled 2020-08-03: qty 0.45

## 2020-08-03 MED ORDER — INSULIN GLARGINE 100 UNIT/ML ~~LOC~~ SOLN
88.0000 [IU] | Freq: Every day | SUBCUTANEOUS | Status: DC
  Administered 2020-08-04 – 2020-08-05 (×2): 88 [IU] via SUBCUTANEOUS
  Filled 2020-08-03 (×3): qty 0.88

## 2020-08-03 NOTE — Progress Notes (Signed)
PROGRESS NOTE   Subjective/Complaints: Hypoglycemic to 46, increased to 55, then 91 with snacks. Cut today's Galrgine dose in half to 45, and changed daily dose to 88 starting tomorrow. Discussed with patient and wife Gout much improved, decrease prednisone to 2.5mg  BID  ROS: Patient denies CP, SOB, N/V/D  Objective:   No results found. No results for input(s): WBC, HGB, HCT, PLT in the last 72 hours. No results for input(s): NA, K, CL, CO2, GLUCOSE, BUN, CREATININE, CALCIUM in the last 72 hours.  Intake/Output Summary (Last 24 hours) at 08/03/2020 1805 Last data filed at 08/03/2020 1247 Gross per 24 hour  Intake 1240 ml  Output 150 ml  Net 1090 ml        Physical Exam: Vital Signs Blood pressure (!) 161/83, pulse 71, temperature 98.1 F (36.7 C), temperature source Oral, resp. rate 17, height 5\' 11"  (1.803 m), weight 103 kg, SpO2 100 %. Gen: no distress, normal appearing HEENT: oral mucosa pink and moist, NCAT Cardio: Reg rate Chest: normal effort, normal rate of breathing Abd: soft, non-distended Ext: no edema Psych: pleasant, normal affect Musculoskeletal:  Cervical back: Normal range of motion.  Right ankle remains swollen and tender to touch and even slight rom.  RUE- biceps 4-/5, triceps 4+/5, grip 4/5, FA 2-/5 LUE 5/5 in same muscles RLE- HF 3-/5, KE 3-/5, KF 3/5, DF 0-1/5; PF 2-/5 Skin: General: Skin is warmand dry.  Neurological:  Mental Status: He is alertand oriented to person, place, and time.    Right facial droop without dysarthria.  Left ptosis with diplopia to left field and split vision in left lower field. Right hemiplegia with decrease in fine motor coordination.  RUE 4/5 prox to 2/5 distal. RLE 3/5 prox to 1-2/5 ADF/PF. Sensation intact       Assessment/Plan: 1. Functional deficits which require 3+ hours per day of interdisciplinary therapy in a comprehensive inpatient rehab  setting.  Physiatrist is providing close team supervision and 24 hour management of active medical problems listed below.  Physiatrist and rehab team continue to assess barriers to discharge/monitor patient progress toward functional and medical goals  Care Tool:  Bathing    Body parts bathed by patient: Right arm,Chest,Abdomen,Front perineal area,Left upper leg,Face,Left arm,Right upper leg,Buttocks,Right lower leg,Left lower leg   Body parts bathed by helper: Left arm,Buttocks,Left lower leg,Right lower leg     Bathing assist Assist Level: Supervision/Verbal cueing     Upper Body Dressing/Undressing Upper body dressing   What is the patient wearing?: Pull over shirt    Upper body assist Assist Level: Independent    Lower Body Dressing/Undressing Lower body dressing      What is the patient wearing?: Pants     Lower body assist Assist for lower body dressing: Supervision/Verbal cueing     Toileting Toileting    Toileting assist Assist for toileting: Minimal Assistance - Patient > 75%     Transfers Chair/bed transfer  Transfers assist     Chair/bed transfer assist level: Contact Guard/Touching assist     Locomotion Ambulation   Ambulation assist      Assist level: Minimal Assistance - Patient > 75% Assistive device: Walker-rolling Max distance:  155 ft   Walk 10 feet activity   Assist     Assist level: Contact Guard/Touching assist Assistive device: Walker-rolling   Walk 50 feet activity   Assist    Assist level: Contact Guard/Touching assist Assistive device: Walker-rolling    Walk 150 feet activity   Assist Walk 150 feet activity did not occur: Safety/medical concerns  Assist level: Minimal Assistance - Patient > 75% Assistive device: Walker-rolling    Walk 10 feet on uneven surface  activity   Assist Walk 10 feet on uneven surfaces activity did not occur: Safety/medical concerns         Wheelchair     Assist Will  patient use wheelchair at discharge?: Yes (Per PT long term goals) Type of Wheelchair: Manual    Wheelchair assist level: Supervision/Verbal cueing Max wheelchair distance: 150    Wheelchair 50 feet with 2 turns activity    Assist        Assist Level: Contact Guard/Touching assist   Wheelchair 150 feet activity     Assist      Assist Level: Supervision/Verbal cueing   Blood pressure (!) 161/83, pulse 71, temperature 98.1 F (36.7 C), temperature source Oral, resp. rate 17, height 5\' 11"  (1.803 m), weight 103 kg, SpO2 100 %.    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 Antithrombotics: -DVT/anticoagulation:Pharmaceutical:Lovenox -antiplatelet therapy:  ASA/Brilinta x4 weeks followed by ASA alone. 3. Pain/RLE spasms/cramping:   -continue trial of baclofen 5mg  tid  -OA vs  gout flare right ankle. Pain is severe. Probably flared by existing OA, tone   - Pt on allopurinol chronically   - prednisone taper---will reduce to 5mg  BID starting 6/4  6/4: much improved, decrease prednisone to 2.5mg  BID AFO should be sufficient    -reviewed xray of ankle which demonstrates significant OA   -hydrocodone for breakthrough pain control, tramadol for moderate pain ContinueTramadol as needed 4. Mood :team to providesupport -antipsychotic agents: N/AA 5. Neuropsych: This patientiscapable of making decisions on hisown behalf. 6. Skin/Wound Care:Routine pressure-relief measures 7. Fluids/Electrolytes/Nutrition:Monitor I's/O. Check electrolytes in a.m. 8.HTN: Monitor blood pressures twice daily.Avoid hypotension to avoid hypoperfusion. Continue Amlodipine and  irbesartan have been on hold.  5/29 resume norvasc at 2.5mg   6/4: elevated, may be 2/2 steroids, taper as above  9.T2DM: Hgb A1C- 8.8. Poorly controlled.  --Monitor blood sugarsac/hsand useSSIfor elevated blood sugars. -- Continue insulin glargine 100 unitsas per home regimen.    CBG (last 3)  Recent Labs    08/03/20 0653 08/03/20 1112 08/03/20 1625  GLUCAP 91 255* 230*    -lower CBG's while on prednisone down to 10mg  BID  , reduce lantus to 90U- will reduce to 5mg  BID in am   6/4: Hypoglycemic to 46- cut Lantus dose in half today and as a result CBGs are up to 200s. Decrease prednisone as above and decrease daily Lantus dose to 88U.  10.CKD IIIb: BaselineBUN/SCR 31/1.77-->33/2.02. Encourage fluid intake. --Patient educated on avoiding NSAIDs.  cont to increase will start IVF 11. ?History of gout vs kidney stone prophyllaxisContinue allopurinol. Monitor for flares.  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 Continue ASA, fish oil and Lipitor. --Offbisoprolol due to bradycardia. 13. Low protein stores: Will add prosource and Ensure Max for supplement.  14. Chronic hypomagnesemia: Changed magnesium to 750mg  HS gluconate (closer to home dose)     LOS: 9 days A FACE TO FACE EVALUATION WAS PERFORMED  Martha Clan P Lama Narayanan 08/03/2020, 6:05 PM

## 2020-08-03 NOTE — Progress Notes (Deleted)
At 0620 patient blood sugar

## 2020-08-03 NOTE — Progress Notes (Addendum)
At 0620 Patient BS noted Hypoglycemic Event  CBG: 46  Treatment: snacked  Symptoms: no symptoms  Follow-up CBG: [id number] CBG Result:55   Follow up CBG: Time 0653  Results: 91  Possible Reasons for Event: unknown  Comments/MD notified: Dr.Raulkar notified.    Mitchell Lambert

## 2020-08-03 NOTE — Progress Notes (Signed)
Patient ID: Mitchell Lambert, male   DOB: 1950-07-20, 70 y.o.   MRN: [id number]

## 2020-08-03 NOTE — Progress Notes (Signed)
Physical Therapy Session Note  Patient Details  Name: Mitchell Lambert MRN: [id number] Date of Birth: 1950-04-02  Today's Date: 08/03/2020 PT Individual Time: 0905-1000 PT Individual Time Calculation (min): 55 min   Short Term Goals:  Week 2:  PT Short Term Goal 1 (Week 2): ST=LT due to ELOS   Skilled Therapeutic Interventions/Progress Updates:   Pt received supine in bed and agreeable to PT. Supine>sit transfer without assist or cues. Pt reports feeling "out of it" following low CBG. Most recent assessment 92.. PT performed vS assessment.    166/98 sitting HR 65 163/89 standing HR 62   PT assisted pt to don shoes and socks as well as RAFO sitting EOB. Stand pivot transfers with RW and supervision assist from PT with cues for AD management. Pt transported to entrance of [facility] in [place].   Gait training over sidewalk. 233f +[id number] supervision assist and RAFO. Cues for improved hip flexion and improved posture as well as AD management over cracks oin sidewalk  WC mobility through simulated community environment of hospital [facility] x [id number] supervision assist for obstacles navigation.   Pt returned to room and performed stand pivot transfer to bed with RW and supervision assist.  Pt able to doff shoes and socks sitting EOB with supervision assist with cues for AFO management. Sit>supine completed with out assist, and left supine in bed with call bell in reach and all needs met.       Therapy Documentation Precautions:  Precautions Precautions: Fall Precaution Comments: diplopia, falls, R hemi Restrictions Weight Bearing Restrictions: No  Pain: Pain Assessment Pain Scale: 0-10 Pain Score: 0-No pain Faces Pain Scale: No hurt Pain Type: Acute pain Pain Location: Head (c/o headache) Pain Frequency: Intermittent Pain Intervention(s): Medication (See eMAR)    Therapy/Group: Individual Therapy  AusLorie Phenix4/2022, 10:08 AM

## 2020-08-04 LAB — GLUCOSE, CAPILLARY
Glucose-Capillary: 103 mg/dL — ABNORMAL HIGH (ref 70–99)
Glucose-Capillary: 151 mg/dL — ABNORMAL HIGH (ref 70–99)
Glucose-Capillary: 217 mg/dL — ABNORMAL HIGH (ref 70–99)
Glucose-Capillary: 256 mg/dL — ABNORMAL HIGH (ref 70–99)

## 2020-08-04 MED ORDER — MAGNESIUM GLUCONATE 500 MG PO TABS
1000.0000 mg | ORAL_TABLET | Freq: Every day | ORAL | Status: DC
  Administered 2020-08-04 – 2020-08-06 (×3): 1000 mg via ORAL
  Filled 2020-08-04 (×4): qty 2

## 2020-08-04 NOTE — Progress Notes (Addendum)
PROGRESS NOTE   Subjective/Complaints: Asks about ophthalmology f/u. He discussed his history of ophthalmologic pathology including retinal tear, cataract, myopia- advised scheduling follow-up with Dr. Flavia Shipper now for lens implant for left eye upon discharge  ROS: Patient denies CP, SOB, N/V/D, +floaters in left eye  Objective:   No results found. No results for input(s): WBC, HGB, HCT, PLT in the last 72 hours. No results for input(s): NA, K, CL, CO2, GLUCOSE, BUN, CREATININE, CALCIUM in the last 72 hours.  Intake/Output Summary (Last 24 hours) at 08/04/2020 1417 Last data filed at 08/04/2020 0828 Gross per 24 hour  Intake 640 ml  Output 300 ml  Net 340 ml        Physical Exam: Vital Signs Blood pressure (!) 144/82, pulse 65, temperature 97.7 F (36.5 C), resp. rate 18, height 5\' 11"  (1.803 m), weight 103 kg, SpO2 96 %. Gen: no distress, normal appearing HEENT: oral mucosa pink and moist, NCAT Cardio: Reg rate Chest: normal effort, normal rate of breathing Abd: soft, non-distended Ext: no edema Psych: pleasant, normal affect Musculoskeletal:  Cervical back: Normal range of motion.  Right ankle remains swollen and tender to touch and even slight rom.  RUE- biceps 4-/5, triceps 4+/5, grip 4/5, FA 2-/5 LUE 5/5 in same muscles RLE- HF 3-/5, KE 3-/5, KF 3/5, DF 0-1/5; PF 2-/5 Skin: General: Skin is warmand dry.  Neurological:  Mental Status: He is alertand oriented to person, place, and time.    Right facial droop without dysarthria.  Left ptosis with diplopia to left field and split vision in left lower field. Right hemiplegia with decrease in fine motor coordination.  RUE 4/5 prox to 2/5 distal. RLE 3/5 prox to 1-2/5 ADF/PF. Sensation intact       Assessment/Plan: 1. Functional deficits which require 3+ hours per day of interdisciplinary therapy in a comprehensive inpatient rehab  setting.  Physiatrist is providing close team supervision and 24 hour management of active medical problems listed below.  Physiatrist and rehab team continue to assess barriers to discharge/monitor patient progress toward functional and medical goals  Care Tool:  Bathing    Body parts bathed by patient: Right arm,Chest,Abdomen,Front perineal area,Left upper leg,Face,Left arm,Right upper leg,Buttocks,Right lower leg,Left lower leg   Body parts bathed by helper: Left arm,Buttocks,Left lower leg,Right lower leg     Bathing assist Assist Level: Supervision/Verbal cueing     Upper Body Dressing/Undressing Upper body dressing   What is the patient wearing?: Pull over shirt    Upper body assist Assist Level: Independent    Lower Body Dressing/Undressing Lower body dressing      What is the patient wearing?: Pants     Lower body assist Assist for lower body dressing: Supervision/Verbal cueing     Toileting Toileting    Toileting assist Assist for toileting: Minimal Assistance - Patient > 75%     Transfers Chair/bed transfer  Transfers assist     Chair/bed transfer assist level: Contact Guard/Touching assist     Locomotion Ambulation   Ambulation assist      Assist level: Minimal Assistance - Patient > 75% Assistive device: Walker-rolling Max distance: 155 ft   Walk 10 feet  activity   Assist     Assist level: Contact Guard/Touching assist Assistive device: Walker-rolling   Walk 50 feet activity   Assist    Assist level: Contact Guard/Touching assist Assistive device: Walker-rolling    Walk 150 feet activity   Assist Walk 150 feet activity did not occur: Safety/medical concerns  Assist level: Minimal Assistance - Patient > 75% Assistive device: Walker-rolling    Walk 10 feet on uneven surface  activity   Assist Walk 10 feet on uneven surfaces activity did not occur: Safety/medical concerns         Wheelchair     Assist Will  patient use wheelchair at discharge?: Yes (Per PT long term goals) Type of Wheelchair: Manual    Wheelchair assist level: Supervision/Verbal cueing Max wheelchair distance: 150    Wheelchair 50 feet with 2 turns activity    Assist        Assist Level: Contact Guard/Touching assist   Wheelchair 150 feet activity     Assist      Assist Level: Supervision/Verbal cueing   Blood pressure (!) 144/82, pulse 65, temperature 97.7 F (36.5 C), resp. rate 18, height 5\' 11"  (1.803 m), weight 103 kg, SpO2 96 %.    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 Antithrombotics: -DVT/anticoagulation:Pharmaceutical:Lovenox -antiplatelet therapy:  ASA/Brilinta x4 weeks followed by ASA alone. 3. Pain/RLE spasms/cramping:   -continue trial of baclofen 5mg  tid  -OA vs  gout flare right ankle. Pain is severe. Probably flared by existing OA, tone   - Pt on allopurinol chronically   - prednisone taper---will reduce to 5mg  BID starting 6/4  6/4: much improved, decrease prednisone to 2.5mg  BID  6/5: resolved, d/c prednisone AFO should be sufficient    -reviewed xray of ankle which demonstrates significant OA   -hydrocodone for breakthrough pain control, tramadol for moderate pain ContinueTramadol as needed 4. Mood :team to providesupport -antipsychotic agents: N/AA 5. Neuropsych: This patientiscapable of making decisions on hisown behalf. 6. Skin/Wound Care:Routine pressure-relief measures 7. Fluids/Electrolytes/Nutrition:Monitor I's/O. Check electrolytes in a.m. 8.HTN: Monitor blood pressures twice daily.Avoid hypotension to avoid hypoperfusion. Continue Amlodipine  and irbesartan have been on hold.  5/29 resume norvasc at 2.5mg   6/4: elevated, may be 2/2 steroids, taper as above   9.T2DM: Hgb A1C- 8.8. Poorly controlled.  --Monitor blood sugarsac/hsand useSSIfor elevated blood sugars. -- Continue insulin glargine 100 unitsas per home regimen.    CBG (last 3)  Recent Labs    08/03/20 2015 08/04/20 0548 08/04/20 1158  GLUCAP 187* 103* 151*    -lower CBG's while on prednisone down to 10mg  BID  , reduce lantus to 90U- will reduce to 5mg  BID in am   6/4: Hypoglycemic to 46- cut Lantus dose in half today and as a result CBGs are up to 200s. Decrease prednisone as above and decrease daily Lantus dose to 88U.   6/5: CBGs 103- 255: d/c steroids. 10.CKD IIIb: BaselineBUN/SCR 31/1.77-->33/2.02. Encourage fluid intake. --Patient educated on avoiding NSAIDs.  cont to increase will start IVF 11. ?History of gout vs kidney stone prophyllaxisContinue allopurinol. Monitor for flares.  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 Continue ASA, fish oil and Lipitor. --Offbisoprolol due to bradycardia. 13. Low protein stores: Will add prosource and Ensure  Max for supplement.  14. Chronic hypomagnesemia: Changed magnesium to 750mg  HS gluconate (closer to home dose) 15. Left eye floaters: candidate for lens implant like he successfully received for right eye: recommended calling outpatient ophthalmologist now to schedule f/u upon hospital d/c     LOS: 10 days A FACE TO FACE EVALUATION WAS [place] 08/04/2020, 2:17 PM

## 2020-08-04 NOTE — Progress Notes (Signed)
Occupational Therapy Session Note  Patient Details  Name: Mitchell Lambert MRN: [id number] Date of Birth: 1951/01/04  Today's Date: 08/04/2020 OT Group Time: 1100-1200 OT Group Time Calculation (min): 60 min   Skilled Therapeutic Interventions/Progress Updates:    Pt engaged in therapeutic w/c level dance group focusing on patient choice, UE/LE strengthening, salience, activity tolerance, and social participation. Pt was guided through various dance-based exercises involving UEs/LEs and trunk. All music was selected by group members. Emphasis placed on Rt NMR and bilateral coordination. Pt attended group with his wife [name]. Both were very participative, requesting songs that pt and his son played. Pt actively working on strengthening/coordinating his Rt side and visibly challenged by this. At end of session pts wife escorted him back to the room via w/c.    Therapy Documentation Precautions:  Precautions Precautions: Fall Precaution Comments: diplopia, falls, R hemi Restrictions Weight Bearing Restrictions: No Pain: no s/s pain during tx   ADL: ADL Eating: Minimal assistance Where Assessed-Eating: Wheelchair Grooming: Minimal assistance Where Assessed-Grooming: Sitting at sink Upper Body Bathing: Minimal assistance Where Assessed-Upper Body Bathing: Wheelchair Lower Body Bathing: Minimal assistance Where Assessed-Lower Body Bathing: Edge of bed Upper Body Dressing: Minimal assistance Where Assessed-Upper Body Dressing: Edge of bed Lower Body Dressing: Minimal assistance Where Assessed-Lower Body Dressing: Edge of bed Toileting: Minimal assistance Where Assessed-Toileting: Glass blower/designer: Psychiatric nurse Method: Arts development officer: Ambulance person Transfer: Not assessed Social research officer, government: Not assessed      Therapy/Group: Group Therapy  Leaman Abe A Alamin Mccuiston 08/04/2020, 12:39 PM

## 2020-08-05 LAB — CBC
HCT: 42.2 % (ref 39.0–52.0)
Hemoglobin: 14.4 g/dL (ref 13.0–17.0)
MCH: 31 pg (ref 26.0–34.0)
MCHC: 34.1 g/dL (ref 30.0–36.0)
MCV: 90.9 fL (ref 80.0–100.0)
Platelets: 302 10*3/uL (ref 150–400)
RBC: 4.64 MIL/uL (ref 4.22–5.81)
RDW: 13.7 % (ref 11.5–15.5)
WBC: 8.7 10*3/uL (ref 4.0–10.5)
nRBC: 0 % (ref 0.0–0.2)

## 2020-08-05 LAB — BASIC METABOLIC PANEL
Anion gap: 6 (ref 5–15)
BUN: 42 mg/dL — ABNORMAL HIGH (ref 8–23)
CO2: 26 mmol/L (ref 22–32)
Calcium: 9.3 mg/dL (ref 8.9–10.3)
Chloride: 109 mmol/L (ref 98–111)
Creatinine, Ser: 1.96 mg/dL — ABNORMAL HIGH (ref 0.61–1.24)
GFR, Estimated: 36 mL/min — ABNORMAL LOW (ref >60)
Glucose, Bld: 123 mg/dL — ABNORMAL HIGH (ref 70–99)
Potassium: 4.5 mmol/L (ref 3.5–5.1)
Sodium: 141 mmol/L (ref 135–145)

## 2020-08-05 LAB — GLUCOSE, CAPILLARY
Glucose-Capillary: 123 mg/dL — ABNORMAL HIGH (ref 70–99)
Glucose-Capillary: 157 mg/dL — ABNORMAL HIGH (ref 70–99)
Glucose-Capillary: 157 mg/dL — ABNORMAL HIGH (ref 70–99)
Glucose-Capillary: 103 mg/dL — ABNORMAL HIGH (ref 70–99)

## 2020-08-05 NOTE — Progress Notes (Signed)
PROGRESS NOTE   Subjective/Complaints: Reviewed labs  HA posterior neck , not sure if this is a "crick", no new vision issues , no new dizziness, or swallowing problems or weakness/numbness  ROS: Patient denies CP, SOB, N/V/D, +diplopia looking to left unchanged   Objective:   No results found. Recent Labs    08/05/20 0713  WBC 8.7  HGB 14.4  HCT 42.2  PLT 302   Recent Labs    08/05/20 0713  NA 141  K 4.5  CL 109  CO2 26  GLUCOSE 123*  BUN 42*  CREATININE 1.96*  CALCIUM 9.3    Intake/Output Summary (Last 24 hours) at 08/05/2020 0919 Last data filed at 08/05/2020 0805 Gross per 24 hour  Intake 1000 ml  Output 300 ml  Net 700 ml        Physical Exam: Vital Signs Blood pressure (!) 162/81, pulse (!) 55, temperature 98.1 F (36.7 C), resp. rate 16, height 5\' 11"  (1.803 m), weight 103 kg, SpO2 99 %.  General: No acute distress Mood and affect are appropriate Heart: Regular rate and rhythm no rubs murmurs or extra sounds Lungs: Clear to auscultation, breathing unlabored, no rales or wheezes Abdomen: Positive bowel sounds, soft nontender to palpation, nondistended Extremities: No clubbing, cyanosis, or edema Skin: No evidence of breakdown, no evidence of rash   Musculoskeletal:  Cervical back: Normal range of motion.  Right ankle remains swollen and tender to touch and even slight rom.  RUE- biceps 4-/5, triceps 4+/5, grip 4/5, FA 2-/5 LUE 5/5 in same muscles RLE- HF 3-/5, KE 3-/5, KF 3/5, DF 0-1/5; PF 2-/5 Skin: General: Skin is warmand dry.  Neurological:  Mental Status: He is alertand oriented to person, place, and time.         Assessment/Plan: 1. Functional deficits which require 3+ hours per day of interdisciplinary therapy in a comprehensive inpatient rehab setting.  Physiatrist is providing close team supervision and 24 hour management of active medical problems listed  below.  Physiatrist and rehab team continue to assess barriers to discharge/monitor patient progress toward functional and medical goals  Care Tool:  Bathing    Body parts bathed by patient: Right arm,Chest,Abdomen,Front perineal area,Left upper leg,Face,Left arm,Right upper leg,Buttocks,Right lower leg,Left lower leg   Body parts bathed by helper: Left arm,Buttocks,Left lower leg,Right lower leg     Bathing assist Assist Level: Supervision/Verbal cueing     Upper Body Dressing/Undressing Upper body dressing   What is the patient wearing?: Pull over shirt    Upper body assist Assist Level: Independent    Lower Body Dressing/Undressing Lower body dressing      What is the patient wearing?: Pants     Lower body assist Assist for lower body dressing: Supervision/Verbal cueing     Toileting Toileting    Toileting assist Assist for toileting: Minimal Assistance - Patient > 75%     Transfers Chair/bed transfer  Transfers assist     Chair/bed transfer assist level: Contact Guard/Touching assist     Locomotion Ambulation   Ambulation assist      Assist level: Minimal Assistance - Patient > 75% Assistive device: Walker-rolling Max distance: 155 ft  Walk 10 feet activity   Assist     Assist level: Contact Guard/Touching assist Assistive device: Walker-rolling   Walk 50 feet activity   Assist    Assist level: Contact Guard/Touching assist Assistive device: Walker-rolling    Walk 150 feet activity   Assist Walk 150 feet activity did not occur: Safety/medical concerns  Assist level: Minimal Assistance - Patient > 75% Assistive device: Walker-rolling    Walk 10 feet on uneven surface  activity   Assist Walk 10 feet on uneven surfaces activity did not occur: Safety/medical concerns         Wheelchair     Assist Will patient use wheelchair at discharge?: Yes (Per PT long term goals) Type of Wheelchair: Manual    Wheelchair  assist level: Supervision/Verbal cueing Max wheelchair distance: 150    Wheelchair 50 feet with 2 turns activity    Assist        Assist Level: Contact Guard/Touching assist   Wheelchair 150 feet activity     Assist      Assist Level: Supervision/Verbal cueing   Blood pressure (!) 162/81, pulse (!) 55, temperature 98.1 F (36.7 C), resp. rate 16, height 5\' 11"  (1.803 m), weight 103 kg, SpO2 99 %.    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 Antithrombotics: -DVT/anticoagulation:Pharmaceutical:Lovenox -antiplatelet therapy:  ASA/Brilinta x4 weeks followed by ASA alone.drop Brillinta  6/22 3. Pain/RLE spasms/cramping:   -continue trial of baclofen 5mg  tid  -OA vs  gout flare right ankle. Pain is severe. Probably flared by existing OA, tone   - Pt on allopurinol chronically   - prednisone taper---will reduce to 5mg  BID starting 6/4  6/4: much improved, decrease prednisone to 2.5mg  BID  6/5: resolved, d/c prednisone AFO should be sufficient    -reviewed xray of ankle which demonstrates significant OA   -hydrocodone for breakthrough pain control, tramadol for moderate pain ContinueTramadol as needed 4. Mood :team to providesupport -antipsychotic agents: N/AA 5. Neuropsych: This patientiscapable of making decisions on hisown behalf. 6. Skin/Wound Care:Routine pressure-relief measures 7. Fluids/Electrolytes/Nutrition:Monitor I's/O. Check electrolytes in a.m. 8.HTN: Monitor blood pressures twice daily.Avoid hypotension to avoid hypoperfusion. Continue Amlodipine and irbesartan have been on hold.  5/29 resume norvasc at 2.5mg   6/4: elevated, may  be 2/2 steroids, taper as above   9.T2DM: Hgb A1C- 8.8. Poorly controlled.  --Monitor blood sugarsac/hsand useSSIfor elevated blood sugars. -- Continue insulin glargine 100 unitsas per home regimen.    CBG (last 3)  Recent Labs    08/04/20 1617 08/04/20 2041 08/05/20 0605  GLUCAP 217* 256* 123*    Control improving off prednisone  10.CKD IIIb: BaselineBUN/SCR 31/1.77-->33/2.02. Encourage fluid intake. --Patient educated on avoiding NSAIDs.  cont to increase will start IVF 11. ?History of gout vs kidney stone prophyllaxisContinue allopurinol. Monitor for flares.  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 Continue ASA, fish oil and Lipitor. --Offbisoprolol due to bradycardia. 13. Low protein stores: Will add prosource and Ensure Max for supplement.  14. Chronic hypomagnesemia: Changed magnesium to 750mg  HS gluconate (closer to home dose) 15. Left eye floaters: candidate for lens implant like he successfully received for right eye: recommended calling outpatient ophthalmologist now to schedule f/u upon hospital d/c     LOS: 11 days  A FACE TO FACE EVALUATION WAS PERFORMED  Charlett Blake 08/05/2020, 9:19 AM

## 2020-08-05 NOTE — Progress Notes (Signed)
Physical Therapy Session Note  Patient Details  Name: Mitchell Lambert MRN: [id number] Date of Birth: [birth year]  Today's Date: 08/05/2020 PT Individual Time:  [phone] and 1415-1530  PT Individual Time Calculation: 27 min and 75 min    Short Term Goals: Week 2:  PT Short Term Goal 1 (Week 2): ST=LT due to ELOS  Skilled Therapeutic Interventions/Progress Updates:  Session 1:  Patient supine in bed on entrance to room. Patient alert and agreeable to PT session. Patient c/o headache and neck pain that he has recently received Tylenol. Concerned re: pains and potential for stroke. MD notified just prior to entrance to room for daily assessment. After MD discussion, pt requests short period of time to allow medication to alleviate pain.  On return, pt feeling better and relates that d/c location has changed. D/t wife's concern over pt's ability to reach townhome safely d/t # of stairs, she has arranged for them to stay with friend who has a one level condo. Condo has only 2 spaced steps to reach front door with low height threshold step. Long term stair goal is now inappropriate and focus changed re: steps.   Therapeutic Activity: Transfers: Patient performed STS transfers with supervision and no need for vc. SPVT transfers require vc for increased focus and supervision using RW.   Gait Training:  Patient ambulated household distances within room for toileting. Uses RW without AFO donned and conscious foot clearance.   Neuromuscular Re-ed: NMR facilitated during session with focus on motor control of R knee AROM. Pt guided in minisquats and high knee marches bilaterally for improved concentric/ eccentric control of R knee hold in neutral extension in order to reduce uncontrolled hyperextension during gait and mobility. NMR performed for improvements in motor control and coordination, balance, sequencing, judgement, and self confidence/ efficacy in performing all aspects of mobility at highest  level of independence.   Patient handed off to OT at end of session.  Session 2: Patient seated upright on EOB on entrance to room. Patient alert and agreeable to PT session. Patient denied pain during session.  Therapeutic Activity: Transfers: Patient performed STS and SPVT transfers throughout session with   Gait Training:  Patient ambulated 180 feet using SBQC and then return amb bout of 180 feet using RW. Pt's quality of gait improves with RW.  Focus on step negotiation training completing four 6-in steps with CGA using R HR and SBQC in LUE. Consistent vc/tc for technique. Pt fearful with descent.   Training with 4" curb step using RW with pt able to perform with one pass including instruction and CGA with significant reduction in FOF. Completed x2 increasing to close supervision and repeating technique verbally.   Neuromuscular Re-ed: NMR facilitated during session with focus on standing balance. Pt guided in Berg Balance test. Demonstrates significant improvements from previous week's performance with score of 36/ 56 (from last week's score of 22/ 56). NMR performed for improvements in motor control and coordination, balance, sequencing, judgement, and self confidence/ efficacy in performing all aspects of mobility at highest level of independence.   Pt c/o diplopia and light dizziness. Eye movements checked with pt's L eye limited in ROM with end of movement at midline and no movement toward L. No nystagmus noted with head turns with focus on target, but difficutlty with balance and dizziness with most mobility paired with head/ neck turns to L side.   Patient supine in bed at end of session with brakes locked, bed alarm set, and all  needs within reach.    Therapy Documentation Precautions:  Precautions Precautions: Fall Precaution Comments: diplopia, falls, R hemi Restrictions Weight Bearing Restrictions: No  Therapy/Group: Individual Therapy  Alger Simons PT,  DPT 08/05/2020, 12:18 PM

## 2020-08-05 NOTE — Progress Notes (Signed)
Patient ID: Mitchell Lambert, male   DOB: 1950-03-19, 70 y.o.   MRN: [id number]   [facility] list to provided to pt spouse.  [place], [place]

## 2020-08-05 NOTE — Progress Notes (Signed)
Patient ID: Mitchell Lambert, male   DOB: Oct 24, 1950, 70 y.o.   MRN: [id number]   Pt referral sent to [place], [place]

## 2020-08-05 NOTE — Progress Notes (Signed)
Occupational Therapy Session Note  Patient Details  Name: Mitchell Lambert MRN: [id number] Date of Birth: June 09, 1950  Today's Date: 08/05/2020 OT Individual Time: 1000-1058 OT Individual Time Calculation (min): 58 min   Skilled Therapeutic Interventions/Progress Updates:    Pt greeted via PT handoff, requesting to brush his teeth and then shower. Pt completed oral care (standing at sink), gathering clothing in prep for ADLs (using RW, stooping to 2nd dresser drawer), bathing (at shower level, seated), and dressing (sit<stand from 3:1 in the bathroom, using device for standing support as needed). All functional transfers completed at ambulatory level with supervision assist while using RW. Pt very mindful of involving his affected Rt hand functionally given min cuing and increased time. He used the step stool to don his Rt gripper sock. Supervision for dynamic standing balance during dressing tasks. Pt with excellent participation and overall motivation to improve his current functional level. At end of session pt returned to bed, assisted with donning 3 pillowcases for Rt hand [place] beforehand. Left pt with all needs within reach and bed alarm set. Tx focus was placed on ADL retraining, Rt NMR, functional ambulation, and dynamic balance.   Therapy Documentation Precautions:  Precautions Precautions: Fall Precaution Comments: diplopia, falls, R hemi Restrictions Weight Bearing Restrictions: No Vital Signs: Therapy Vitals Temp: 98.6 F (37 C) Pulse Rate: 68 Resp: 16 BP: (!) 141/91 Patient Position (if appropriate): Sitting Oxygen Therapy SpO2: 99 % O2 Device: Room Air Pain: Pain Assessment Pain Score: 5  ADL: ADL Eating: Minimal assistance Where Assessed-Eating: Wheelchair Grooming: Minimal assistance Where Assessed-Grooming: Sitting at sink Upper Body Bathing: Minimal assistance Where Assessed-Upper Body Bathing: Wheelchair Lower Body Bathing: Minimal assistance Where  Assessed-Lower Body Bathing: Edge of bed Upper Body Dressing: Minimal assistance Where Assessed-Upper Body Dressing: Edge of bed Lower Body Dressing: Minimal assistance Where Assessed-Lower Body Dressing: Edge of bed Toileting: Minimal assistance Where Assessed-Toileting: Glass blower/designer: Psychiatric nurse Method: Arts development officer: Energy manager: Not assessed Social research officer, government: Not assessed      Therapy/Group: Individual Therapy  Kashmere Daywalt A Tausha Milhoan 08/05/2020, 12:22 PM

## 2020-08-05 NOTE — Progress Notes (Signed)
Patient ID: Mitchell Lambert, male   DOB: 11/26/1950, 70 y.o.   MRN: [id number]  RW ordered through [place].  [address], [place]

## 2020-08-06 LAB — GLUCOSE, CAPILLARY
Glucose-Capillary: 58 mg/dL — ABNORMAL LOW (ref 70–99)
Glucose-Capillary: 77 mg/dL (ref 70–99)
Glucose-Capillary: 220 mg/dL — ABNORMAL HIGH (ref 70–99)
Glucose-Capillary: 160 mg/dL — ABNORMAL HIGH (ref 70–99)
Glucose-Capillary: 128 mg/dL — ABNORMAL HIGH (ref 70–99)
Glucose-Capillary: 401 mg/dL — ABNORMAL HIGH (ref 70–99)

## 2020-08-06 MED ORDER — INSULIN GLARGINE 100 UNIT/ML ~~LOC~~ SOLN
80.0000 [IU] | Freq: Every day | SUBCUTANEOUS | Status: DC
  Filled 2020-08-06: qty 0.8

## 2020-08-06 MED ORDER — INSULIN GLARGINE 100 UNIT/ML ~~LOC~~ SOLN
80.0000 [IU] | Freq: Every day | SUBCUTANEOUS | Status: DC
  Filled 2020-08-06 (×2): qty 0.8

## 2020-08-06 MED ORDER — AMLODIPINE BESYLATE 5 MG PO TABS
5.0000 mg | ORAL_TABLET | Freq: Every day | ORAL | Status: DC
  Administered 2020-08-07: 5 mg via ORAL
  Filled 2020-08-06: qty 1

## 2020-08-06 MED ORDER — INSULIN GLARGINE 100 UNIT/ML ~~LOC~~ SOLN: 80.0000 [IU] | Freq: Every day | SUBCUTANEOUS | Status: DC

## 2020-08-06 MED ORDER — INSULIN ASPART 100 UNIT/ML IJ SOLN
5.0000 [IU] | Freq: Once | INTRAMUSCULAR | Status: AC
  Administered 2020-08-06: 5 [IU] via SUBCUTANEOUS

## 2020-08-06 NOTE — Progress Notes (Addendum)
Hypoglycemic Event  CBG: 58  Treatment: 4 oz juice/soda, graham cracker  Symptoms: None  Follow-up CBG: Time: 0617 CBG Result:77  Possible Reasons for Event: unknown, patient got SSI with meals.  Comments/MD notified:yes, notified to on call PA Dan Woodston

## 2020-08-06 NOTE — Plan of Care (Signed)
  Problem: RH Balance Goal: LTG Patient will maintain dynamic standing with ADLs (OT) Description: LTG:  Patient will maintain dynamic standing balance with assist during activities of daily living (OT)  Outcome: Completed/Met   Problem: Sit to Stand Goal: LTG:  Patient will perform sit to stand in prep for activites of daily living with assistance level (OT) Description: LTG:  Patient will perform sit to stand in prep for activites of daily living with assistance level (OT) Outcome: Completed/Met   Problem: RH Eating Goal: LTG Patient will perform eating w/assist, cues/equip (OT) Description: LTG: Patient will perform eating with assist, with/without cues using equipment (OT) Outcome: Completed/Met   Problem: RH Grooming Goal: LTG Patient will perform grooming w/assist,cues/equip (OT) Description: LTG: Patient will perform grooming with assist, with/without cues using equipment (OT) Outcome: Completed/Met   Problem: RH Bathing Goal: LTG Patient will bathe all body parts with assist levels (OT) Description: LTG: Patient will bathe all body parts with assist levels (OT) Outcome: Completed/Met   Problem: RH Dressing Goal: LTG Patient will perform upper body dressing (OT) Description: LTG Patient will perform upper body dressing with assist, with/without cues (OT). Outcome: Completed/Met Goal: LTG Patient will perform lower body dressing w/assist (OT) Description: LTG: Patient will perform lower body dressing with assist, with/without cues in positioning using equipment (OT) Outcome: Completed/Met   Problem: RH Toileting Goal: LTG Patient will perform toileting task (3/3 steps) with assistance level (OT) Description: LTG: Patient will perform toileting task (3/3 steps) with assistance level (OT)  Outcome: Completed/Met   Problem: RH Functional Use of Upper Extremity Goal: LTG Patient will use RT/LT upper extremity as a (OT) Description: LTG: Patient will use right/left upper  extremity as a stabilizer/gross assist/diminished/nondominant/dominant level with assist, with/without cues during functional activity (OT) Outcome: Completed/Met   Problem: RH Simple Meal Prep Goal: LTG Patient will perform simple meal prep w/assist (OT) Description: LTG: Patient will perform simple meal prep with assistance, with/without cues (OT). Outcome: Completed/Met   Problem: RH Toilet Transfers Goal: LTG Patient will perform toilet transfers w/assist (OT) Description: LTG: Patient will perform toilet transfers with assist, with/without cues using equipment (OT) Outcome: Completed/Met   Problem: RH Tub/Shower Transfers Goal: LTG Patient will perform tub/shower transfers w/assist (OT) Description: LTG: Patient will perform tub/shower transfers with assist, with/without cues using equipment (OT) Outcome: Completed/Met   Problem: RH Furniture Transfers Goal: LTG Patient will perform furniture transfers w/assist (OT/PT) Description: LTG: Patient will perform furniture transfers  with assistance (OT/PT). Outcome: Completed/Met

## 2020-08-06 NOTE — Progress Notes (Signed)
Physical Therapy Session Note  Patient Details  Name: Mitchell Lambert MRN: [id number] Date of Birth: 1950/05/10  Today's Date: 08/06/2020 PT Individual Time: 7897-8478 PT Individual Time Calculation (min): 56 min   Short Term Goals: Week 2:  PT Short Term Goal 1 (Week 2): ST=LT due to ELOS  Skilled Therapeutic Interventions/Progress Updates:    Patient received sitting up in bed, agreeable to PT. He denies pain. He was able to come to sit edge of bed ModI and don shoes + R AFO with ModA. Patient transferring to wc via stand pivot with CGA. PT transporting patient in wc to therapy gym for time management and energy conservation. Reviewed safe negotiation of stair management. He reports "a few" steps to enter his friends house (where he will be d/cing to from here?) with L HR. He was able to negotiate 2x4 steps with L HR and CGA. Verbal cues for engagement of R LE for improved stability when descending. Patient able to transfer in/out of small SUV height car with CGA. Gait up/down ramp and over mulch with RW and CGA. Verbal cues for slow, intentional gait to ensure full stability of R LE to minimize hyperextension in stance. Following NMR in parallel bars completed with emphasis on stance control of R knee: mini squats 3x15, standing marches with 5# ankle weights 3x15. Patient returning to room in wc, transferring to bed via stand pivot with CGA. Bed alarm on, call light within reach.   Therapy Documentation Precautions:  Precautions Precautions: Fall Precaution Comments: diplopia, mild R hemi Restrictions Weight Bearing Restrictions: No    Therapy/Group: Individual Therapy  Karoline Caldwell, PT, DPT, CBIS  08/06/2020, 7:43 AM

## 2020-08-06 NOTE — Progress Notes (Signed)
Pt complaining of constipation, no bm in 72hrs. Prn sorbitol given.

## 2020-08-06 NOTE — Progress Notes (Signed)
Patient ID: Mitchell Lambert, male   DOB: Jul 07, 1950, 70 y.o.    MRN: [id number]   Pt approved by [facility].  [place], [place]

## 2020-08-06 NOTE — Progress Notes (Signed)
Physical Therapy Session Note  Patient Details  Name: Mitchell Lambert MRN: [id number] Date of Birth: 08/23/1950  Today's Date: 08/06/2020 PT Individual Time: 1350-1434 PT Individual Time Calculation (min): 44 min   Short Term Goals: Week 2:  PT Short Term Goal 1 (Week 2): ST=LT due to ELOS  Skilled Therapeutic Interventions/Progress Updates:    Pt received supine in bed and agreeable to therapy session. Supine>sitting R EOB, HOB partially elevated, mod-I. Sitting EOB, pt donned L shoe with set-up assist and R shoe with AFO requiring max assist despite various attempts to perform without assistance - recommended pt obtain a long handled shoe horn to increase independence. Pt discusses his L oculomotor deficit with lack of ability to visually scan L laterally - educated pt on neurophthamologist services available, pt aware. Sit<>stands using RW supervision during session. Pt's personal RW was delivered to room - therapist adjusted height for proper fit. Gait training ~129ft 2x to/from main therapy gym using RW with supervision, cuing for increased hip extension and upright posture - pt demos L knee snapping back into hyperextension during mid stance to terminal stance, despite cuing for improvement pt unable to prevent this - discussed heel wedge with pt reporting he previously tried one during CIR stay but he had a gout flair up associated with the increased pressure/weightbearing through his forefoot caused by the wedge, pt interested in trying a wedge again so therapist recommended pt address this with follow-up therapy so they can monitor pt's response to the change, pt in agreement with this plan. Gait training up/down curb step using RW 2x with supervision - pt demos very safe and precise AD management on/off curb step without cuing. At end of session pt returned sit>supine mod-I. Pt left supine in bed with needs in reach and bed alarm on.   Therapy Documentation Precautions:   Precautions Precautions: Fall Precaution Comments: diplopia, mild R hemi Restrictions Weight Bearing Restrictions: No  Pain: No reports of pain throughout session.  Therapy/Group: Individual Therapy  Tawana Scale , PT, DPT, CSRS  08/06/2020, 12:25 PM

## 2020-08-06 NOTE — Progress Notes (Addendum)
PROGRESS NOTE   Subjective/Complaints: Reviewed labs  Hypoglycemic episode this am  No pain c/os  ROS: Patient denies CP, SOB, N/V/D, +diplopia looking to left unchanged   Objective:   No results found. Recent Labs    08/05/20 0713  WBC 8.7  HGB 14.4  HCT 42.2  PLT 302   Recent Labs    08/05/20 0713  NA 141  K 4.5  CL 109  CO2 26  GLUCOSE 123*  BUN 42*  CREATININE 1.96*  CALCIUM 9.3    Intake/Output Summary (Last 24 hours) at 08/06/2020 0809 Last data filed at 08/06/2020 8413 Gross per 24 hour  Intake 480 ml  Output 500 ml  Net -20 ml        Physical Exam: Vital Signs Blood pressure (!) 158/85, pulse (!) 59, temperature 97.9 F (36.6 C), temperature source Oral, resp. rate 17, height 5\' 11"  (1.803 m), weight 103 kg, SpO2 96 %.  General: No acute distress Mood and affect are appropriate Heart: Regular rate and rhythm no rubs murmurs or extra sounds Lungs: Clear to auscultation, breathing unlabored, no rales or wheezes Abdomen: Positive bowel sounds, soft nontender to palpation, nondistended Extremities: No clubbing, cyanosis, or edema Skin: No evidence of breakdown, no evidence of rash   Musculoskeletal:  Cervical back: Normal range of motion.  Right ankle remains swollen and tender to touch and even slight rom.  RUE- biceps 4-/5, triceps 4+/5, grip 4/5, FA 2-/5 LUE 5/5 in same muscles RLE- HF 3-/5, KE 3-/5, KF 3/5, DF 0-1/5; PF 2-/5 Skin: General: Skin is warmand dry.  Neurological:  Mental Status: He is alertand oriented to person, place, and time.         Assessment/Plan: 1. Functional deficits which require 3+ hours per day of interdisciplinary therapy in a comprehensive inpatient rehab setting.  Physiatrist is providing close team supervision and 24 hour management of active medical problems listed below.  Physiatrist and rehab team continue to assess barriers to  discharge/monitor patient progress toward functional and medical goals  Care Tool:  Bathing    Body parts bathed by patient: Right arm,Chest,Abdomen,Front perineal area,Left upper leg,Face,Left arm,Right upper leg,Buttocks,Right lower leg,Left lower leg   Body parts bathed by helper: Left arm,Buttocks,Left lower leg,Right lower leg     Bathing assist Assist Level: Set up assist     Upper Body Dressing/Undressing Upper body dressing   What is the patient wearing?: Pull over shirt    Upper body assist Assist Level: Independent with assistive device    Lower Body Dressing/Undressing Lower body dressing      What is the patient wearing?: Pants     Lower body assist Assist for lower body dressing: Independent with assitive device     Toileting Toileting    Toileting assist Assist for toileting: Supervision/Verbal cueing     Transfers Chair/bed transfer  Transfers assist     Chair/bed transfer assist level: Contact Guard/Touching assist     Locomotion Ambulation   Ambulation assist      Assist level: Minimal Assistance - Patient > 75% Assistive device: Walker-rolling Max distance: 155 ft   Walk 10 feet activity   Assist  Assist level: Contact Guard/Touching assist Assistive device: Walker-rolling   Walk 50 feet activity   Assist    Assist level: Contact Guard/Touching assist Assistive device: Walker-rolling    Walk 150 feet activity   Assist Walk 150 feet activity did not occur: Safety/medical concerns  Assist level: Minimal Assistance - Patient > 75% Assistive device: Walker-rolling    Walk 10 feet on uneven surface  activity   Assist Walk 10 feet on uneven surfaces activity did not occur: Safety/medical concerns         Wheelchair     Assist Will patient use wheelchair at discharge?: Yes (Per PT long term goals) Type of Wheelchair: Manual    Wheelchair assist level: Supervision/Verbal cueing Max wheelchair  distance: 150    Wheelchair 50 feet with 2 turns activity    Assist        Assist Level: Contact Guard/Touching assist   Wheelchair 150 feet activity     Assist      Assist Level: Supervision/Verbal cueing   Blood pressure (!) 158/85, pulse (!) 59, temperature 97.9 F (36.6 C), temperature source Oral, resp. rate 17, height 5\' 11"  (1.803 m), weight 103 kg, SpO2 96 %.    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 Antithrombotics: -DVT/anticoagulation:Pharmaceutical:Lovenox -antiplatelet therapy:  ASA/Brilinta x4 weeks followed by ASA alone.drop Brillinta  6/22 3. Pain/RLE spasms/cramping:   -continue trial of baclofen 5mg  tid  -OA vs  gout flare right ankle. Pain is severe. Probably flared by existing OA, tone   - Pt on allopurinol chronically   - prednisone taper---will reduce to 5mg  BID starting 6/4  6/4: much improved, decrease prednisone to 2.5mg  BID  6/5: resolved, d/c prednisone AFO should be sufficient    -reviewed xray of ankle which demonstrates significant OA   -hydrocodone for breakthrough pain control, tramadol for moderate pain ContinueTramadol as needed 4. Mood :team to providesupport -antipsychotic agents: N/AA 5. Neuropsych: This patientiscapable of making decisions on hisown behalf. 6. Skin/Wound Care:Routine pressure-relief measures 7. Fluids/Electrolytes/Nutrition:Monitor I's/O. Check electrolytes in a.m. 8.HTN: Monitor blood pressures twice daily.Avoid hypotension to avoid hypoperfusion. Continue Amlodipine and irbesartan have been on hold.  5/29 resume norvasc at 2.5mg    Vitals:   08/05/20 1935 08/06/20  0423  BP: (!) 163/87 (!) 158/85  Pulse: 63 (!) 59  Resp: 17 17  Temp: 98.5 F (36.9 C) 97.9 F (36.6 C)  SpO2: 99% 96%  increase amlodipine to 5mg  daily for discharge  9.T2DM: Hgb A1C- 8.8. Poorly controlled.  --Monitor blood sugarsac/hsand useSSIfor elevated blood sugars. -- Continue insulin glargine but reduce to 80 U (home dose 100U)   CBG (last 3)  Recent Labs    08/05/20 2055 08/06/20 0558 08/06/20 0617  GLUCAP 103* 58* 77  ordered hs snack  10.CKD IIIb: BaselineBUN/SCR 31/1.77-->33/2.02. Encourage fluid intake. --Patient educated on avoiding NSAIDs.  cont to increase will start IVF 11. ?History of gout vs kidney stone prophyllaxisContinue allopurinol. Monitor for flares.  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 Continue ASA, fish oil and Lipitor. --Offbisoprolol due to bradycardia. 13. Low protein stores: Will add prosource and Ensure Max for supplement.  14. Chronic hypomagnesemia: Changed magnesium to 750mg  HS gluconate (closer to home dose) 15. Left eye  floaters: candidate for lens implant like he successfully received for right eye: recommended calling outpatient ophthalmologist now to schedule f/u upon hospital d/c     LOS: 12 days A FACE TO Ola E Othon Guardia 08/06/2020, 8:09 AM

## 2020-08-06 NOTE — Progress Notes (Signed)
Occupational Therapy Discharge Summary  Patient Details  Name: Mitchell Lambert MRN: [id number] Date of Birth: Mar 04, 1950  Today's Date: 08/06/2020 OT Individual Time: 2395-3202 OT Individual Time Calculation (min): 57 min    Patient has met 13 of 13 long term goals due to improved activity tolerance, improved balance, postural control, ability to compensate for deficits, functional use of  RIGHT upper extremity and improved coordination.  Patient to discharge at overall Supervision level.  Patient's care partner unavailable to provide the necessary physical assistance at discharge.    Reasons goals not met: NA  Recommendation:  Patient will benefit from ongoing skilled OT services in home health setting to continue to advance functional skills in the area of BADL, iADL and Reduce care partner burden.  Equipment: No equipment provided Pt to ind purchase TTB.  Reasons for discharge: treatment goals met and discharge from hospital  Patient/family agrees with progress made and goals achieved: Yes  OT Discharge Precautions/Restrictions  Precautions Precautions: Fall Precaution Comments: diplopia, mild R hemi Restrictions Weight Bearing Restrictions: No Pain Pain Assessment Pain Scale: 0-10 Pain Score: 0-No pain Faces Pain Scale: No hurt Pain Type: Acute pain (c/o headache) Pain Descriptors / Indicators: Discomfort;Dull Pain Frequency: Intermittent Patients Stated Pain Goal: 0 Pain Intervention(s): Medication (See eMAR) ADL ADL Eating: Independent Where Assessed-Eating: Edge of bed Grooming: Independent Where Assessed-Grooming: Sitting at sink,Standing at sink Upper Body Bathing: Independent Where Assessed-Upper Body Bathing: Shower Lower Body Bathing: Supervision/safety,Setup Where Assessed-Lower Body Bathing: Shower Upper Body Dressing: Independent Where Assessed-Upper Body Dressing: Edge of bed Lower Body Dressing: Supervision/safety Where Assessed-Lower Body  Dressing: Edge of bed Toileting: Supervision/safety Where Assessed-Toileting: Glass blower/designer: Close supervision Toilet Transfer Method: TEFL teacher: Energy manager: Close supervison Clinical cytogeneticist Method: Librarian, academic: Engineer, water: Close supervision Social research officer, government Method: Heritage manager: Civil engineer, contracting with back Vision Baseline Vision/History: Wears glasses (hx of R detatched retina with lens replacement, wears L contact) Wears Glasses: Reading only Patient Visual Report: Blurring of vision;Diplopia Vision Assessment?: Yes Eye Alignment: Within Functional Limits (dysconjugate gaze on L) Ocular Range of Motion: Restricted on the left Alignment/Gaze Preference: Within Defined Limits Tracking/Visual Pursuits: Decreased smoothness of vertical tracking;Requires cues, head turns, or add eye shifts to track;Decreased smoothness of eye movement to RIGHT inferior field Saccades: Additional eye shifts occurred during testing;Decreased speed of saccadic movement Convergence: Impaired (comment) Visual Fields: No apparent deficits Diplopia Assessment: Disappears with one eye closed;Objects split side to side;Only with left gaze Depth Perception: Undershoots Perception  Perception: Within Functional Limits Praxis Praxis: Intact Cognition Overall Cognitive Status: Within Functional Limits for tasks assessed Arousal/Alertness: Awake/alert Orientation Level: Oriented X4 Memory: Appears intact Problem Solving: Appears intact Safety/Judgment: Appears intact Sensation Sensation Light Touch: Appears Intact Hot/Cold: Appears Intact Proprioception: Appears Intact Stereognosis: Impaired Detail Stereognosis Impaired Details: Impaired RUE Coordination Gross Motor Movements are Fluid and Coordinated: No Fine Motor Movements are Fluid and  Coordinated: No Coordination and Movement Description: mild R sided hemi, dysmetria LE>UE Finger Nose Finger Test: decreased speed with mild dysmetria, greatly improved from eval 9 Hole Peg Test: R: 1 min 26 secs Motor  Motor Motor: Hemiplegia Motor - Discharge Observations: mild R hemi Mobility  Bed Mobility Bed Mobility: Supine to Sit;Sit to Supine Supine to Sit: Independent with assistive device Sit to Supine: Independent with assistive device Transfers Sit to Stand: Independent with assistive device  Trunk/Postural Assessment  Cervical Assessment Cervical Assessment: Within Functional  Limits Thoracic Assessment Thoracic Assessment: Exceptions to [facility] (mild rounding of shoulders) Lumbar Assessment Lumbar Assessment: Exceptions to [facility] (posterior pelvic tilt) Postural Control Postural Control: Within Functional Limits  Balance Balance Balance Assessed: Yes Static Sitting Balance Static Sitting - Balance Support: No upper extremity supported Static Sitting - Level of Assistance: 7: Independent Dynamic Sitting Balance Dynamic Sitting - Balance Support: No upper extremity supported Dynamic Sitting - Level of Assistance: 6: Modified independent (Device/Increase time) Static Standing Balance Static Standing - Balance Support: Bilateral upper extremity supported Static Standing - Level of Assistance: 6: Modified independent (Device/Increase time) Dynamic Standing Balance Dynamic Standing - Balance Support: No upper extremity supported Dynamic Standing - Level of Assistance: 5: Stand by assistance Extremity/Trunk Assessment RUE Assessment RUE Assessment: Exceptions to [facility] Active Range of Motion (AROM) Comments: ~165 degrees active shoulder flexion General Strength Comments: 42 lbs grip strength RUE Body System: Neuro Brunstrum levels for arm and hand: Arm;Hand Brunstrum level for arm: Stage IV Movement is deviating from synergy Brunstrum level for hand: Stage V Independence  from basic synergies LUE Assessment LUE Assessment: Within Functional Limits Active Range of Motion (AROM) Comments: 3/4 to full shoulder flexion General Strength Comments: 86 lb grip strength, 5/5 shoulder flexion  Session Note: Pt received asleep in bed, easily awakened by voice, denies pain and agreeable to therapy.DC reassessments completed as documented above. Came to sitting EOB with mod I. STS and stand-pivot to w/c with close S + no AD. MD and LPN in/out for assessment/medication administration. Put in contact with ind seated at sink. STS and amb to toilet with RW with close S. Distant S for clothing management, no void. Ambulatory transfer to shower with close S+ RW via posterior method. States his wife had already purchased a TTB. Full-body bathing with distant S seated on TTB. Completed full-body dressing on TTB with the above assist levels, did req 1 VC to sit down for LBD.  Amb back to bed with RW + close S. Pt states he has no further questions at this time for OT and feels comfortable going home.  Pt left seated EOB with call bell in reach, and all immediate needs met.    Volanda Napoleon MS, OTR/L  08/06/2020, 6:54 AM

## 2020-08-07 LAB — GLUCOSE, CAPILLARY
Glucose-Capillary: 81 mg/dL (ref 70–99)
Glucose-Capillary: 214 mg/dL — ABNORMAL HIGH (ref 70–99)

## 2020-08-07 MED ORDER — INSULIN GLARGINE 100 UNIT/ML ~~LOC~~ SOLN
70.0000 [IU] | Freq: Every day | SUBCUTANEOUS | Status: DC
  Administered 2020-08-07: 70 [IU] via SUBCUTANEOUS
  Filled 2020-08-07: qty 0.7

## 2020-08-07 MED ORDER — MELATONIN 3 MG PO TABS: 3.0000 mg | ORAL_TABLET | Freq: Every day | ORAL | 0 refills | Status: DC

## 2020-08-07 MED ORDER — LORAZEPAM 0.5 MG PO TABS: 0.5000 mg | ORAL_TABLET | Freq: Every day | ORAL | Status: DC | PRN

## 2020-08-07 MED ORDER — TICAGRELOR 90 MG PO TABS: 90.0000 mg | ORAL_TABLET | Freq: Two times a day (BID) | ORAL | 0 refills | Status: DC

## 2020-08-07 MED ORDER — NOVOLOG FLEXPEN 100 UNIT/ML ~~LOC~~ SOPN: 5.0000 [IU] | PEN_INJECTOR | SUBCUTANEOUS | 11 refills | Status: AC | PRN

## 2020-08-07 MED ORDER — PANTOPRAZOLE SODIUM 40 MG PO TBEC: 40.0000 mg | DELAYED_RELEASE_TABLET | Freq: Every day | ORAL | 0 refills | Status: DC

## 2020-08-07 MED ORDER — HYDROCODONE-ACETAMINOPHEN 5-325 MG PO TABS: 1.0000 | ORAL_TABLET | Freq: Two times a day (BID) | ORAL | 0 refills | Status: DC | PRN

## 2020-08-07 MED ORDER — BASAGLAR KWIKPEN 100 UNIT/ML ~~LOC~~ SOPN: 70.0000 [IU] | PEN_INJECTOR | Freq: Every morning | SUBCUTANEOUS | Status: AC

## 2020-08-07 MED ORDER — MAGNESIUM GLUCONATE 500 MG PO TABS: 1000.0000 mg | ORAL_TABLET | Freq: Every day | ORAL | 0 refills | Status: AC

## 2020-08-07 MED ORDER — BACLOFEN 5 MG PO TABS: 5.0000 mg | ORAL_TABLET | Freq: Three times a day (TID) | ORAL | 0 refills | Status: DC

## 2020-08-07 NOTE — Plan of Care (Signed)
  Problem: RH Balance Goal: LTG Patient will maintain dynamic sitting balance (PT) Description: LTG:  Patient will maintain dynamic sitting balance with assistance during mobility activities (PT) Outcome: Completed/Met Flowsheets (Taken 08/06/2020 1956) LTG: Pt will maintain dynamic sitting balance during mobility activities with:: Independent Goal: LTG Patient will maintain dynamic standing balance (PT) Description: LTG:  Patient will maintain dynamic standing balance with assistance during mobility activities (PT) Outcome: Completed/Met Flowsheets (Taken 08/06/2020 1956) LTG: Pt will maintain dynamic standing balance during mobility activities with:: Independent with assistive device    Problem: Sit to Stand Goal: LTG:  Patient will perform sit to stand with assistance level (PT) Description: LTG:  Patient will perform sit to stand with assistance level (PT) Outcome: Completed/Met Flowsheets (Taken 08/06/2020 1956) LTG: PT will perform sit to stand in preparation for functional mobility with assistance level: Independent with assistive device   Problem: RH Bed Mobility Goal: LTG Patient will perform bed mobility with assist (PT) Description: LTG: Patient will perform bed mobility with assistance, with/without cues (PT). Outcome: Completed/Met Flowsheets (Taken 08/06/2020 1956) LTG: Pt will perform bed mobility with assistance level of: Independent with assistive device    Problem: RH Bed to Chair Transfers Goal: LTG Patient will perform bed/chair transfers w/assist (PT) Description: LTG: Patient will perform bed to chair transfers with assistance (PT). Outcome: Completed/Met Flowsheets (Taken 08/06/2020 1956) LTG: Pt will perform Bed to Chair Transfers with assistance level: Supervision/Verbal cueing   Problem: RH Car Transfers Goal: LTG Patient will perform car transfers with assist (PT) Description: LTG: Patient will perform car transfers with assistance (PT). Outcome:  Completed/Met Flowsheets (Taken 08/06/2020 1156) LTG: Pt will perform car transfers with assist:: Supervision/Verbal cueing   Problem: RH Ambulation Goal: LTG Patient will ambulate in controlled environment (PT) Description: LTG: Patient will ambulate in a controlled environment, # of feet with assistance (PT). Outcome: Completed/Met Flowsheets Taken 08/06/2020 1956 by Alger Simons, PT LTG: Pt will ambulate in controlled environ  assist needed:: Supervision/Verbal cueing Taken 07/26/2020 1231 by Lorie Phenix, PT LTG: Ambulation distance in controlled environment: 175f with LRAD Goal: LTG Patient will ambulate in home environment (PT) Description: LTG: Patient will ambulate in home environment, # of feet with assistance (PT). Outcome: Completed/Met Flowsheets (Taken 07/26/2020 1231 by TLorie Phenix PT) LTG: Pt will ambulate in home environ  assist needed:: Supervision/Verbal cueing LTG: Ambulation distance in home environment: 569f  Problem: RH Wheelchair Mobility Goal: LTG Patient will propel w/c in controlled environment (PT) Description: LTG: Patient will propel wheelchair in controlled environment, # of feet with assist (PT) Outcome: Completed/Met

## 2020-08-07 NOTE — Progress Notes (Signed)
Inpatient Rehabilitation Care Coordinator Discharge Note  The overall goal for the admission was met for:   Discharge location: Yes, home  Length of Stay: Yes, 13 Days  Discharge activity level: Yes, Sup/CGA  Home/community participation: Yes  Services provided included: MD, RD, PT, OT, SLP, RN, CM, TR, Pharmacy, Neuropsych and SW  Financial Services: Medicare  Choices offered to/list presented to:pt and spouse  Follow-up services arranged: Home Health: [facility]   Comments (or additional information): Rolling Walker PT OT   Patient/Family verbalized understanding of follow-up arrangements: Yes  Individual responsible for coordination of the follow-up plan: Barbara, [phone]  Confirmed correct DME delivered: Christina J Baskerville 08/07/2020    Christina J Baskerville 

## 2020-08-07 NOTE — Discharge Summary (Addendum)
Physician Discharge Summary  Patient ID: Mitchell Lambert MRN: [id number] DOB/AGE: 03-Apr-1950 70 y.o.  Admit date: 07/25/2020 Discharge date: 08/07/2020  Discharge Diagnoses:  Principal Problem:   Left pontine stroke [facility]) Active Problems:   Essential hypertension   CKD (chronic kidney disease), stage II   Diabetes mellitus type 2 in obese (HCC)   Acute right hemiparesis ([place])   Discharged Condition: stable   Significant Diagnostic Studies: DG Ankle 2 Views Right  Result Date: 07/27/2020 CLINICAL DATA:  Right ankle pain after rehab EXAM: RIGHT ANKLE - 2 VIEW COMPARISON:  None. FINDINGS: Frontal and lateral views of the right ankle are obtained. No acute fracture, subluxation, or dislocation. Moderate osteoarthritis of the ankle, with significant osteophytes along the talus, medial malleolus, and lateral malleolus. Mild diffuse soft tissue edema. Extensive atherosclerosis. IMPRESSION: 1. Significant osteoarthritis of the right ankle. No acute bony abnormalities. 2. Mild diffuse soft tissue edema. Electronically Signed   By: Randa Ngo M.D.   On: 07/27/2020 22:10   CT HEAD WO CONTRAST  Result Date: 07/21/2020 CLINICAL DATA:  Diplopia EXAM: CT HEAD WITHOUT CONTRAST TECHNIQUE: Contiguous axial images were obtained from the base of the skull through the vertex without intravenous contrast. COMPARISON:  05/27/2020 FINDINGS: Brain: Cavum septum pellucidum. Parenchymal volume loss is commensurate with the patient's age. Mild periventricular white matter changes are present likely reflecting the sequela of small vessel ischemia. Remote left parietal subcortical white matter infarct and punctate left thalamic infarct again noted. No abnormal intra or extra-axial mass lesion or fluid collection. No abnormal mass effect or midline shift. No evidence of acute intracranial hemorrhage or infarct. Ventricular size is normal. Cerebellum unremarkable. Vascular: No asymmetric hyperdense vasculature at the  skull base. Skull: Intact Sinuses/Orbits: Moderate mucosal thickening involving the right maxillary sinus without air-fluid level. Remaining paranasal sinuses are clear. Orbits are unremarkable. Other: Mastoid air cells and middle ear cavities are clear. IMPRESSION: No acute intracranial hemorrhage or infarct. Stable senescent changes and remote infarcts. Unchanged moderate right maxillary sinus disease. Electronically Signed   By: Fidela Salisbury MD   On: 07/21/2020 22:15   MR BRAIN WO CONTRAST  Result Date: 07/23/2020 CLINICAL DATA:  Stroke follow-up EXAM: MRI HEAD WITHOUT CONTRAST TECHNIQUE: Multiplanar, multiecho pulse sequences of the brain and surrounding structures were obtained without intravenous contrast. COMPARISON:  05/28/2020 FINDINGS: Brain: Restricted diffusion at the left pontomedullary junction and in the left cerebral white matter lateral to the atrium of the lateral ventricle. No acute hemorrhage, hydrocephalus, or collection. Chronic perforator infarct at the left thalamus. Small remote left parietal cortex infarct. Vascular: Preserved flow voids Skull and upper cervical spine: Normal marrow signal Sinuses/Orbits: Progressive, complete opacification of the right maxillary sinus when compared to prior. Postoperative right globe. IMPRESSION: 1. Small acute infarcts at the left pontine medullary junction and left cerebral white matter. 2. Small remote left parietal infarct. Chronic lacunar infarct at the left thalamus. 3. Active right maxillary sinusitis which has progressed from comparison 2 months ago. Electronically Signed   By: Monte Fantasia M.D.   On: 07/23/2020 08:43   CUP PACEART REMOTE DEVICE CHECK  Result Date: 07/24/2020 ILR summary report received. Battery status OK. Normal device function. No new symptom, tachy, brady, or pause episodes. No new AF episodes. Monthly summary reports and ROV/PRN Kathy Breach, RN, CCDS, [employer]  VAS US CAROTID  Result Date:  07/26/2020 Carotid Arterial Duplex Study Patient Name:  Mitchell Lambert  Date of Exam:   07/24/2020 Medical Rec #:  [id number]        Accession #:    [id number] Date of Birth: 1950/06/07        Patient Gender: M Patient Age:   69Y Exam Location:  [facility] Procedure:      VAS US CAROTID Referring Phys: [id number] [facility] [place] --------------------------------------------------------------------------------  Indications:      CVA. Risk Factors:     Hypertension, hyperlipidemia, Diabetes, coronary artery                   disease. Comparison Study: no prior Performing Technologist: Darlin Coco RDMS,RVT  Examination Guidelines: A complete evaluation includes B-mode imaging, spectral Doppler, color Doppler, and power Doppler as needed of all accessible portions of each vessel. Bilateral testing is considered an integral part of a complete examination. Limited examinations for reoccurring indications may be performed as noted.  Right Carotid Findings: +----------+--------+--------+--------+------------------+--------+           PSV cm/sEDV cm/sStenosisPlaque DescriptionComments +----------+--------+--------+--------+------------------+--------+ CCA Prox  107     13              heterogenous               +----------+--------+--------+--------+------------------+--------+ CCA Distal118     23              heterogenous               +----------+--------+--------+--------+------------------+--------+ ICA Prox  92      23      1-39%   heterogenous               +----------+--------+--------+--------+------------------+--------+ ICA Distal71      24                                         +----------+--------+--------+--------+------------------+--------+ ECA       114     15                                         +----------+--------+--------+--------+------------------+--------+ +----------+--------+-------+--------+-------------------+           PSV cm/sEDV  cmsDescribeArm Pressure (mmHG) +----------+--------+-------+--------+-------------------+ Subclavian127                                        +----------+--------+-------+--------+-------------------+ +---------+--------+--+--------+--+---------+ VertebralPSV cm/s66EDV cm/s17Antegrade +---------+--------+--+--------+--+---------+  Left Carotid Findings: +----------+--------+--------+--------+------------------+--------+           PSV cm/sEDV cm/sStenosisPlaque DescriptionComments +----------+--------+--------+--------+------------------+--------+ CCA Prox  124     28              heterogenous               +----------+--------+--------+--------+------------------+--------+ CCA Distal86      23              heterogenous               +----------+--------+--------+--------+------------------+--------+ ICA Prox  83      29      1-39%   heterogenous               +----------+--------+--------+--------+------------------+--------+ ICA Distal73      16                                         +----------+--------+--------+--------+------------------+--------+  ECA       107     17                                         +----------+--------+--------+--------+------------------+--------+ +----------+--------+--------+--------+-------------------+           PSV cm/sEDV cm/sDescribeArm Pressure (mmHG) +----------+--------+--------+--------+-------------------+ Subclavian127                                         +----------+--------+--------+--------+-------------------+ +---------+--------+--+--------+--+---------+ VertebralPSV cm/s48EDV cm/s11Antegrade +---------+--------+--+--------+--+---------+   Summary: Right Carotid: Velocities in the right ICA are consistent with a 1-39% stenosis. Left Carotid: Velocities in the left ICA are consistent with a 1-39% stenosis. Vertebrals: Bilateral vertebral arteries demonstrate antegrade flow. *See table(s)  above for measurements and observations.  Electronically signed by Antony Contras MD on 07/26/2020 at 1:55:50 PM.    Final    VAS [place] TRANSCRANIAL DOPPLER  Result Date: 07/26/2020  Transcranial Doppler Patient Name:  Mitchell Lambert  Date of Exam:   07/24/2020 Medical Rec #: [id number]        Accession #:    [id number] Date of Birth: May 18, 1950        Patient Gender: M Patient Age:   69Y Exam [facility]  [facility] Procedure:      VAS [place] TRANSCRANIAL DOPPLER Referring Phys: [id number] [facility] [place] --------------------------------------------------------------------------------  Indications: Stroke. Limitations for diagnostic windows: Unable to insonate left transtemporal window. Comparison Study: no prior Performing Technologist: Abram Sander RVS  Examination Guidelines: A complete evaluation includes B-mode imaging, spectral Doppler, color Doppler, and power Doppler as needed of all accessible portions of each vessel. Bilateral testing is considered an integral part of a complete examination. Limited examinations for reoccurring indications may be performed as noted.  +----------+-------------+----------+-----------+-------+ RIGHT TCD Right VM (cm)Depth (cm)PulsatilityComment +----------+-------------+----------+-----------+-------+ MCA           23.00                 2.01            +----------+-------------+----------+-----------+-------+ PCA           41.00                 1.14            +----------+-------------+----------+-----------+-------+ Opthalmic     25.00                 1.38            +----------+-------------+----------+-----------+-------+ ICA siphon    34.00                 1.58            +----------+-------------+----------+-----------+-------+ Vertebral    -22.00                 0.84            +----------+-------------+----------+-----------+-------+  +----------+------------+----------+-----------+-------+ LEFT TCD  Left VM (cm)Depth  (cm)PulsatilityComment +----------+------------+----------+-----------+-------+ Opthalmic    25.00                 1.43            +----------+------------+----------+-----------+-------+ ICA siphon   30.00                 1.51            +----------+------------+----------+-----------+-------+  Vertebral    -17.00                1.06            +----------+------------+----------+-----------+-------+  +------------+-------+-------+             VM cm/sComment +------------+-------+-------+ Prox Basilar-20.00         +------------+-------+-------+ Dist Basilar-21.00         +------------+-------+-------+ Summary:  Absent left temporal and poor windows throughout limit exam. low normal mean flow velocitie sin majority of identified vessels of anterior and posterior cerbral circulations. *See table(s) above for TCD measurements and observations.  Diagnosing physician: Antony Contras MD Electronically signed by Antony Contras MD on 07/26/2020 at 1:57:21 PM.    Final     Labs:  Basic Metabolic Panel: BMP Latest Ref Rng & Units 08/05/2020 07/29/2020 07/26/2020  Glucose 70 - 99 mg/dL 123(H) 304(H) 151(H)  BUN 8 - 23 mg/dL 42(H) 53(H) 36(H)  Creatinine 0.61 - 1.24 mg/dL 1.96(H) 2.16(H) 1.94(H)  BUN/Creat Ratio 10 - 24 - - -  Sodium 135 - 145 mmol/L 141 136 139  Potassium 3.5 - 5.1 mmol/L 4.5 4.7 4.2  Chloride 98 - 111 mmol/L 109 107 108  CO2 22 - 32 mmol/L 26 21(L) 24  Calcium 8.9 - 10.3 mg/dL 9.3 9.1 9.2    CBC: CBC Latest Ref Rng & Units 08/05/2020 07/29/2020 07/26/2020  WBC 4.0 - 10.5 K/uL 8.7 10.4 8.8  Hemoglobin 13.0 - 17.0 g/dL 14.4 13.5 14.2  Hematocrit 39.0 - 52.0 % 42.2 38.7(L) 41.1  Platelets 150 - 400 K/uL 302 242 234    CBG: Recent Labs  Lab 08/06/20 1118 08/06/20 1635 08/06/20 2052 08/07/20 0555 08/07/20 1114  GLUCAP 160* 128* 401* 81 214*    Brief HPI:   Mitchell Lambert is a 70 y.o. male with history of CKD 2, CAD, T2DM, CVA 04/2020 with transient right  vision changes and aphasia who was placed on Plavix.  He was admitted on 07/22/2018 with left facial droop, dizziness, nausea and double vision.  MRI brain done revealing small acute infarct in left pontine medullary junction.  Patient reported that symptoms had started the day before and was out of window for tPA.    Dr. Leonie Man expressed concerns of cardioembolic stroke and recommended DAPT of aspirin and Brilinta x4 weeks followed by aspirin alone.  She he had issues with bradycardia therefore metoprolol was discontinued and BP meds held to avoid hypoperfusion.  Therapy initiated and patient was noted to have limitations due to Right ataxia, balance deficits, mild cognitive deficits and diplopia. CIR was recommended due to functional decline.    Hospital Course: Mitchell Lambert was admitted to rehab 07/25/2020 for inpatient therapies to consist of PT and OT at least three hours five days a week. Past admission physiatrist, therapy team and rehab RN have worked together to provide customized collaborative inpatient rehab.  His blood pressures have been monitored on 3 times daily basis and have been stable off meds.  He is tolerating DAPT without side effects.  Follow-up check of electrolytes showed improvement in acute on chronic renal failure.  CBC shows H&H and platelets to be stable.  He did report left ankle pain and x-rays done showing significant OA.  He was treated with a course of steroids with improvement in symptoms.    His diabetes has been monitored with ac/hs CBG checks and SSI was use prn for tighter BS control.  With increase in activity he  started developing hypoglycemia.  Lantus has been down titrated to 70 units to prevent hypoglycemic episodes and patient advised to monitor blood sugars on achs basis and use 3 units NovoLog as needed blood sugars greater than 130 or to contact PCP for further input.  He has had improvement in mild ataxia and has been fitted with right AFO to help with gait  quality.  He has made good gains and is currently at supervision level.  He will continue to receive follow-up home health PT/OT by [facility] after discharge.   Rehab course: During patient's stay in rehab weekly team conferences were held to monitor patient's progress, set goals and discuss barriers to discharge. At admission, patient required min assist with basic ADL tasks and mod assist with mobility. He has had improvement in activity tolerance, balance, postural control as well as ability to compensate for deficits.  He requires supervision for transfers and for mobility.  Family education was completed with wife.   Disposition: Home  Diet: Heart Healthy/Carb Modified  Special Instructions: 1. Monitor BS ac/hs and use novolog 3 units prn BS >130. 2.  Stop Brilinta after 06/22 or per input from Dr. Leonie Man on 06/14 visit.   Allergies as of 08/07/2020      Reactions   Gabapentin Other (See Comments)   "made me so dizzy I missed work for 2 days"   Metformin And Related Shortness Of Breath   Sulfa Antibiotics Shortness Of Breath, Rash   Azithromycin Other (See Comments)   not good for heart condition   Ciprofloxacin Other (See Comments)   tendon rupture   Lisinopril Cough   Losartan Potassium Other (See Comments)   hypotension   Olmesartan Cough   Simvastatin Cough   Amoxicillin Rash, Other (See Comments)   rash Did it involve swelling of the face/tongue/throat, SOB, or low BP? N Did it involve sudden or severe rash/hives, skin peeling, or any reaction on the inside of your mouth or nose?  Y Did you need to seek medical attention at a hospital or doctor's office? N When did it last happen?"years and years ago" If all above answers are "NO", may proceed with cephalosporin use.   Doxycycline Rash   Penicillins Rash      Medication List    STOP taking these medications   acetaminophen 325 MG tablet Commonly known as: TYLENOL   glucosamine-chondroitin 500-400  MG tablet   ibuprofen 200 MG tablet Commonly known as: ADVIL   irbesartan 300 MG tablet Commonly known as: AVAPRO   magnesium oxide 400 MG tablet Commonly known as: MAG-OX   oxymetazoline 0.05 % nasal spray Commonly known as: AFRIN   Praluent 150 MG/ML Soaj Generic drug: Alirocumab     TAKE these medications   Accu-Chek Guide test strip Generic drug: glucose blood USE TO TEST BLOOD SUGAR 2 TO 3 TIMES A DAY   allopurinol 300 MG tablet Commonly known as: ZYLOPRIM Take 300 mg by mouth daily.   amLODipine 5 MG tablet Commonly known as: NORVASC TAKE 1 TABLET(5 MG) BY MOUTH DAILY What changed: See the new instructions.   aspirin 81 MG EC tablet Take 1 tablet (81 mg total) by mouth daily. Swallow whole.   atorvastatin 40 MG tablet Commonly known as: LIPITOR Take 40 mg by mouth daily.   B-D ULTRAFINE III SHORT PEN 31G X 8 MM Misc Generic drug: Insulin Pen Needle USE UTD WITH FLEXPEN.   Baclofen 5 MG Tabs Take 5 mg by mouth 3 (three)  times daily.   Basaglar KwikPen 100 UNIT/ML Inject 70 Units into the skin in the morning. In AM What changed: how much to take   Biotin 5000 MCG Tabs Take 5,000 mcg by mouth daily.   fish oil-omega-3 fatty acids 1000 MG capsule Take 1 g by mouth 2 (two) times daily.   HYDROcodone-acetaminophen 5-325 MG tablet Commonly known as: NORCO/VICODIN Take 1 tablet by mouth every 12 (twelve) hours as needed for severe pain.   LORazepam 0.5 MG tablet Commonly known as: ATIVAN Take 1 tablet (0.5 mg total) by mouth daily as needed for anxiety.   magnesium gluconate 500 MG tablet Commonly known as: MAGONATE Take 2 tablets (1,000 mg total) by mouth at bedtime.   melatonin 3 MG Tabs tablet Take 1 tablet (3 mg total) by mouth at bedtime.   multivitamin with minerals tablet Take 1 tablet by mouth daily.   nitroGLYCERIN 0.4 MG SL tablet Commonly known as: NITROSTAT Place 0.4 mg under the tongue as needed for chest pain.   NovoLOG FlexPen  100 UNIT/ML FlexPen Generic drug: insulin aspart Inject 5 Units into the skin as needed for high blood sugar. If you eat more than 50% of your meal and if blood sugar is >120 What changed:   how much to take  additional instructions   pantoprazole 40 MG tablet Commonly known as: PROTONIX Take 1 tablet (40 mg total) by mouth daily.   ticagrelor 90 MG Tabs tablet Commonly known as: BRILINTA Take 1 tablet (90 mg total) by mouth 2 (two) times daily.   traMADol 50 MG tablet Commonly known as: ULTRAM Take 50 mg by mouth daily.       Follow-up Information    Kirsteins, Luanna Salk, MD Follow up.   Specialty: Physical Medicine and Rehabilitation Why: office will call you with follow up appointment Contact information: [place] [address] [phone]        Lawerance Cruel, MD. Call.   Specialty: Family Medicine Why: for post hospital follow up Contact information: [address] [place] [address] [phone]        [facility]. Call.   Why: for post stroke follow up Contact information: [address]     [place] [address] [phone]              Signed: Bary Leriche 08/07/2020, 8:50 PM

## 2020-08-07 NOTE — Progress Notes (Signed)
PROGRESS NOTE   Subjective/Complaints: Reviewed labs  Hypoglycemic episode this am  No pain c/os  ROS: Patient denies CP, SOB, N/V/D, +diplopia looking to left unchanged   Objective:   No results found. Recent Labs    08/05/20 0713  WBC 8.7  HGB 14.4  HCT 42.2  PLT 302   Recent Labs    08/05/20 0713  NA 141  K 4.5  CL 109  CO2 26  GLUCOSE 123*  BUN 42*  CREATININE 1.96*  CALCIUM 9.3    Intake/Output Summary (Last 24 hours) at 08/07/2020 0845 Last data filed at 08/07/2020 0700 Gross per 24 hour  Intake 840 ml  Output 450 ml  Net 390 ml        Physical Exam: Vital Signs Blood pressure (!) 148/84, pulse (!) 55, temperature 98.2 F (36.8 C), temperature source Oral, resp. rate 18, height 5\' 11"  (1.803 m), weight 103 kg, SpO2 98 %.  General: No acute distress Mood and affect are appropriate Heart: Regular rate and rhythm no rubs murmurs or extra sounds Lungs: Clear to auscultation, breathing unlabored, no rales or wheezes Abdomen: Positive bowel sounds, soft nontender to palpation, nondistended Extremities: No clubbing, cyanosis, or edema Skin: No evidence of breakdown, no evidence of rash   Musculoskeletal:  Cervical back: Normal range of motion.  Right ankle remains swollen and tender to touch and even slight rom.  RUE- biceps 4-/5, triceps 4+/5, grip 4/5, FA 2-/5 LUE 5/5 in same muscles RLE- HF 3-/5, KE 3-/5, KF 3/5, DF 0-1/5; PF 2-/5 Skin: General: Skin is warmand dry.  Neurological:  Mental Status: He is alertand oriented to person, place, and time.         Assessment/Plan:  1. 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 Stable for D/C today F/u PCP in 3-4 weeks F/u PM&R 2 weeks F/u Neuro 1-40mo See D/C summary  See D/C instructions    Care Tool:  Bathing    Body parts bathed by patient: Right arm,Chest,Abdomen,Front perineal area,Left upper  leg,Face,Left arm,Right upper leg,Buttocks,Right lower leg,Left lower leg   Body parts bathed by helper: Left arm,Buttocks,Left lower leg,Right lower leg     Bathing assist Assist Level: Set up assist     Upper Body Dressing/Undressing Upper body dressing   What is the patient wearing?: Pull over shirt    Upper body assist Assist Level: Independent with assistive device    Lower Body Dressing/Undressing Lower body dressing      What is the patient wearing?: Pants     Lower body assist Assist for lower body dressing: Supervision/Verbal cueing     Toileting Toileting    Toileting assist Assist for toileting: Supervision/Verbal cueing     Transfers Chair/bed transfer  Transfers assist     Chair/bed transfer assist level: Supervision/Verbal cueing Chair/bed transfer assistive device: Programmer, multimedia   Ambulation assist      Assist level: Supervision/Verbal cueing Assistive device: Walker-rolling Max distance: 120ft   Walk 10 feet activity   Assist     Assist level: Supervision/Verbal cueing Assistive device: Walker-rolling   Walk 50 feet activity   Assist    Assist level: Supervision/Verbal  cueing Assistive device: Walker-rolling    Walk 150 feet activity   Assist Walk 150 feet activity did not occur: Safety/medical concerns  Assist level: Supervision/Verbal cueing Assistive device: Walker-rolling    Walk 10 feet on uneven surface  activity   Assist Walk 10 feet on uneven surfaces activity did not occur: Safety/medical concerns   Assist level: Contact Guard/Touching assist Assistive device: Aeronautical engineer Will patient use wheelchair at discharge?: Yes Type of Wheelchair: Manual    Wheelchair assist level: Supervision/Verbal cueing Max wheelchair distance: 150    Wheelchair 50 feet with 2 turns activity    Assist        Assist Level: Supervision/Verbal cueing   Wheelchair  150 feet activity     Assist      Assist Level: Supervision/Verbal cueing   Blood pressure (!) 148/84, pulse (!) 55, temperature 98.2 F (36.8 C), temperature source Oral, resp. rate 18, height 5\' 11"  (1.803 m), weight 103 kg, SpO2 98 %.    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 Antithrombotics: -DVT/anticoagulation:Pharmaceutical:Lovenox -antiplatelet therapy:  ASA/Brilinta x4 weeks followed by ASA alone.drop Brillinta  6/22 3. Pain/RLE spasms/cramping:   -continue trial of baclofen 5mg  tid  -OA vs  gout flare right ankle. Pain is severe. Probably flared by existing OA, tone   - Pt on allopurinol chronically   - prednisone taper---will reduce to 5mg  BID starting 6/4  6/4: much improved, decrease prednisone to 2.5mg  BID  6/5: resolved, d/c prednisone AFO should be sufficient    -reviewed xray of ankle which demonstrates significant OA   -hydrocodone for breakthrough pain control, tramadol for moderate pain ContinueTramadol as needed 4. Mood :team to providesupport -antipsychotic agents: N/AA 5. Neuropsych: This patientiscapable of making decisions on hisown behalf. 6. Skin/Wound Care:Routine pressure-relief measures 7. Fluids/Electrolytes/Nutrition:Monitor I's/O. Check electrolytes in a.m. 8.HTN: Monitor blood pressures twice daily.Avoid hypotension to avoid hypoperfusion. Continue Amlodipine and irbesartan have been on hold.  5/29 resume norvasc at 2.5mg    Vitals:   08/06/20 1947 08/07/20 0550  BP: (!) 156/84 (!) 148/84  Pulse: 67 (!) 55  Resp: 18 18  Temp: 98.4 F (36.9 C) 98.2 F (36.8 C)  SpO2: 100% 98%  increase amlodipine  to 5mg  daily for discharge , start today  9.T2DM: Hgb A1C- 8.8. Poorly controlled.  --Monitor blood sugarsac/hsand useSSIfor elevated blood sugars. -- Continue insulin glargine but reduce to 80 U (home dose 100U)   CBG (last 3)  Recent Labs    08/06/20 1635 08/06/20 2052 08/07/20 0555  GLUCAP 128* 401* 81  ordered hs snack, had 5U insulin last HS, a bit low this am , had evening elevation yesterday  10.CKD IIIb: BaselineBUN/SCR 31/1.77-->33/2.02. Encourage fluid intake. --Patient educated on avoiding NSAIDs.  cont to increase will start IVF 11. ?History of gout vs kidney stone prophyllaxisContinue allopurinol. Monitor for flares.  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 Continue ASA, fish oil and Lipitor. --Offbisoprolol due to bradycardia. 13. Low protein stores: Will add prosource and Ensure Max for supplement.  14. Chronic hypomagnesemia: Changed magnesium to 750mg  HS gluconate (closer to home dose) 15. Left eye floaters: candidate for lens implant like he successfully received for right eye: recommended calling  outpatient ophthalmologist now to schedule f/u upon hospital d/c     LOS: 13 days A FACE TO [place] E Anyia Gierke 08/07/2020, 8:45 AM

## 2020-08-07 NOTE — Progress Notes (Signed)
Physical Therapy Discharge Summary  Patient Details  Name: Mitchell Lambert MRN: [id number] Date of Birth: 1950/08/16  Today's Date: 08/06/2020 PT Individual Time: 4010-2725  PT Individual Time Calculation: 28 min     Patient has met 9 of 9 long term goals due to improved activity tolerance, improved balance, increased strength, decreased pain, functional use of  right upper extremity and right lower extremity, improved attention, improved awareness and improved coordination.  Patient to discharge at an ambulatory level Supervision.   Patient's care partner is independent to provide the necessary physical assistance at discharge.  Reasons goals not met: n/a  Recommendation:  Patient will benefit from ongoing skilled PT services in home health setting to continue to advance safe functional mobility, address ongoing impairments in strength, coordination, balance, activity tolerance, safety awareness, and to minimize fall risk.  Equipment: RW  Reasons for discharge: treatment goals met and discharge from hospital  Patient/family agrees with progress made and goals achieved: Yes  PT Discharge Precautions/Restrictions Precautions: Fall Precaution Comments: diplopia, mild R hemi Required Braces or Orthoses: R AFO to be worn during ambulation Restrictions Weight Bearing Restrictions: No Pain Pain Assessment Pain Scale: 0-10 Pain Score: 0-No pain Patients Stated Pain Goal: 0 Vision/Perception  Vision Baseline Vision/History: Wears glasses (hx of R detatched retina with lens replacement, wears L contact) Wears Glasses: Reading only Patient Visual Report: Blurring of vision;Diplopia Vision Assessment?: Yes Eye Alignment: Within Functional Limits (dysconjugate gaze on L) Ocular Range of Motion: Restricted on the left Alignment/Gaze Preference: Within Defined Limits Tracking/Visual Pursuits: Left eye does not track laterally; Impaired - to be further tested in functional  context Saccades: Additional eye shifts occurred during testing Convergence: Impaired - to be further tested in functional context Visual Fields: No apparent deficits Diplopia Assessment: Disappears with one eye closed;Objects split side to side;Only with left gaze Perception  Perception: Within Functional Limits Attention/ Neglect: Appears intact Praxis Praxis: Intact  Cognition Overall Cognitive Status: Within Functional Limits for tasks assessed Arousal/Alertness: Awake/alert Orientation Level: Oriented X4  Memory: Appears intact Problem Solving: Appears intact Safety/Judgment: Appears intact Sensation Sensation Light Touch: Appears Intact Coordination Gross Motor Movements are Fluid and Coordinated: No Fine Motor Movements are Fluid and Coordinated: No Coordination and Movement Description: mild R sided hemi, dysmetria LE>UE Heel Shin Test: improving mild dysmetria on the RLE Motor  Motor Motor: Hemiplegia Motor - Discharge Observations: mild R hemi with decreased motor control LE>UE   Mobility Bed Mobility Bed Mobility: Rolling Right;Rolling Left;Supine to Sit;Sit to Supine Rolling Right: Independent with assistive device Rolling Left: Independent with assistive device Supine to Sit: Independent with assistive device Sit to Supine: Independent with assistive device Transfers Sit to Stand: Independent with assistive device Stand Pivot Transfers: Supervision/ Verbal cueing  Stand Pivot Transfer Details: Verbal cues for precautions/ safety Transfer Assistive Device: Rolling Walker  Locomotion  Gait Ambulation: Yes Gait Assistance: Supervision/ Verbal cueing Gait Distance (Feet): 150 Feet Assistive Device: Rolling Walker Gait Assistance Details: Verbal cues for precautions/ safety Gait Gait: Yes Gait Pattern: Impaired Gait Pattern: Right genu recurvatum; Decreased stance time - right Gait velocity: decr but improved from eval Stairs/ Additional  Locomotion Stairs: Yes Stairs Assistance: Contact Guard/ Touching Assistance Stair Management technique: One rail Number of Stairs: 8 Height of Stairs: 6 Ramp: Supervision/ Verbal cueing Curb: Publishing copy Mobility: Yes Wheelchair Assistance: Probation officer: Both upper extremities; Both lower extermities Wheelchair Parts Management: Supervision/ Verbal cueing Distance: 150 Trunk/Postural Assessment  Cervical  Assessment Cervical Assessment: Within Functional Limits Thoracic Assessment Thoracic Assessment: Exceptions to [facility] (mild rounding of shoulders) Lumbar Assessment Lumbar Assessment: Within Functional Limits Postural Control Postural Control: Within Functional Limits    Balance Balance Balance Assessed: Yes Standardized Balance Assessment Standardized Balance Assessment: Berg Balance Test Berg Balance Test Sit to Stand: Able to stand without using hands and stabilize independently Standing Unsupported: Able to stand safely 2 minutes Sitting with Back Unsupported but Feet Supported on Floor or Stool: Able to sit safely and securely 2 minutes Stand to Sit: Sits safely with minimal use of hands Transfers: Able to transfer safely, definite need of hands Standing Unsupported with Eyes Closed: Able to stand 10 seconds with supervision Standing Ubsupported with Feet Together:Able to place feet together independently and stand for 1 minute with supervision From Standing, Reach Forward with Outstretched Arm: Can reach forward >5 cm safely (2") From Standing Position, Pick up Object from Floor: Unable to pick up and needs supervision From Standing Position, Turn to Look Behind Over each Shoulder: Looks behind one side only/other side shows less weight shift Turn 360 Degrees: Needs close supervision or verbal cueing Standing Unsupported, Alternately Place Feet on Step/Stool: Needs assistance to keep from  falling or unable to try Standing Unsupported, One Foot in Front: Able to take small step independently and hold 30 seconds Standing on One Leg: Able to lift leg independently and hold equal to or more than 3 seconds Total Score: 36 Static Sitting Balance Static Sitting - Balance Support: No upper extremity supported; Feet unsupported Static Sitting - Level of Assistance: 7: Independent Dynamic Sitting Balance Dynamic Sitting - Balance Support: No upper extremity supported Dynamic Sitting - Level of Assistance:7: Independent Dynamic Sitting - Balance Activities: Lateral lean/weight shifting; Forward lean/weight shifting; Reaching for weighted objects; Reaching for objects; Reaching across midline Static Standing Balance Static Standing - Balance Support: No upper extremity supported; Bilateral upper extremity supported Static Standing - Level of Assistance: 6: Modified independent (Device/Increase time) Dynamic Standing Balance Dynamic Standing - Balance Support: Right upper extremity supported Dynamic Standing - Level of Assistance: 5: Stand by assistance  Dynamic Standing - Balance Activities: Lateral lean/weight shifting; Forward lean/weight shifting; Reaching for objects; Reaching for weighted objects; Reaching across midline; Kicking ball Extremity Assessment  RLE Assessment RLE Assessment: Exceptions to [facility] RLE Strength Right Hip Flexion: 4-/5 Right Hip Extension: 4-/5 Right Hip ABduction: 4/5 Right Hip ADduction: 4/5 Right Knee Flexion: 3+/5 Right Knee Extension: 4/5 Right Ankle Dorsiflexion: 2-/5 Right Ankle Plantar Flexion: 2+/5 LLE Assessment LLE Assessment: Within Functional Limits General Strength Comments: grossly 5/5 proximal to distal       Session Note:  Patient seated on EOB on entrance to room and requesting assistance to reach bathroom. States that he has not had a bowel movement for 3 days and received medicine today to help.  No new pain complaint during  session.  Ambulates using RW from bed to toilet and performs transfer and clothing mgmt under distant supervision. Ambulates to sink to wash hands and returns to bed with good safety awareness of items in room and manages walker appropriately.   Discussion with pt re: pt's readiness to return home and provided with functional ideas for including drumming in therapeutic activities to continue to develop and progress UE/ LE motor control. Provided pt with information re: Stroke Support Group and informed pt of additional support available from multiple support groups on Facebook. Pt appreciative of all information and looking forward to next phase in progress.  Patient supine in bed at end of session with bed alarm set, and all needs within reach.  Alger Simons PT, DPT 08/06/2020, 8:34 PM

## 2020-08-07 NOTE — Discharge Instructions (Signed)
Inpatient Rehab Discharge Instructions  Mitchell Lambert Discharge date and time: 08/07/20   Activities/Precautions/ Functional Status: Activity: no lifting, driving, or strenuous exercise till cleared by MD Diet: cardiac diet and diabetic diet Wound Care: none needed    Functional status:  ___ No restrictions     ___ Walk up steps independently _X__ 24/7 supervision/assistance   ___ Walk up steps with assistance ___ Intermittent supervision/assistance  ___ Bathe/dress independently ___ Walk with walker     ___ Bathe/dress with assistance ___ Walk Independently    ___ Shower independently ___ Walk with assistance    _X__ Shower with assistance _X__ No alcohol     ___ Return to work/school ________   Special Instructions: 1.  Monitor blood sugars before meals and at bedtime. Note decrease in insulin dose.  Contact MD for titration/input on sliding scale if blood sugars start running over 130.    COMMUNITY REFERRALS UPON DISCHARGE:    Home Health:   PT     OT                     Agency: [facility] Phone: [phone]    Medical Equipment/Items Ordered: Rolling Walker                                                  Agency/Supplier: [employer]    STROKE/TIA DISCHARGE INSTRUCTIONS SMOKING Cigarette smoking nearly doubles your risk of having a stroke & is the single most alterable risk factor  If you smoke or have smoked in the last 12 months, you are advised to quit smoking for your health.  Most of the excess cardiovascular risk related to smoking disappears within a year of stopping.  Ask you doctor about anti-smoking medications  [place] Quit Line: 1-800-QUIT NOW  Free Smoking Cessation Classes [phone]  CHOLESTEROL Know your levels; limit fat & cholesterol in your diet  Lipid Panel     Component Value Date/Time   CHOL 156 07/23/2020 0208   TRIG 195 (H) 07/23/2020 0208   HDL 29 (L) 07/23/2020 0208   CHOLHDL 5.4 07/23/2020 0208   VLDL 39  07/23/2020 0208   LDLCALC 88 07/23/2020 0208      Many patients benefit from treatment even if their cholesterol is at goal.  Goal: Total Cholesterol (CHOL) less than 160  Goal:  Triglycerides (TRIG) less than 150  Goal:  HDL greater than 40  Goal:  LDL (LDLCALC) less than 100   BLOOD PRESSURE American Stroke Association blood pressure target is less that 120/80 mm/Hg  Your discharge blood pressure is:  BP: (!) 148/84  Monitor your blood pressure  Limit your salt and alcohol intake  Many individuals will require more than one medication for high blood pressure  DIABETES (A1c is a blood sugar average for last 3 months) Goal HGBA1c is under 7% (HBGA1c is blood sugar average for last 3 months)  Diabetes:     Lab Results  Component Value Date   HGBA1C 8.5 (H) 07/23/2020     Your HGBA1c can be lowered with medications, healthy diet, and exercise.  Check your blood sugar as directed by your physician  Call your physician if you experience unexplained or low blood sugars.  PHYSICAL ACTIVITY/REHABILITATION Goal is 30 minutes at least 4 days per week  Activity: No driving, Therapies:  see above Return to work: N/A  Activity decreases your risk of heart attack and stroke and makes your heart stronger.  It helps control your weight and blood pressure; helps you relax and can improve your mood.  Participate in a regular exercise program.  Talk with your doctor about the best form of exercise for you (dancing, walking, swimming, cycling).  DIET/WEIGHT Goal is to maintain a healthy weight  Your discharge diet is:  Diet Order            Diet heart healthy/carb modified Room service appropriate? Yes; Fluid consistency: Thin  Diet effective now                liquids Your height is:  Height: 5\' 11"  (180.3 cm) Your current weight is: Weight: 103 kg Your Body Mass Index (BMI) is:  BMI (Calculated): 31.67  Following the type of diet specifically designed for you will help prevent  another stroke.  Your goal weight is 179 lbs  Your goal Body Mass Index (BMI) is 19-24.  Healthy food habits can help reduce 3 risk factors for stroke:  High cholesterol, hypertension, and excess weight.  RESOURCES Stroke/Support Group:  Call [phone]   STROKE EDUCATION PROVIDED/REVIEWED AND GIVEN TO PATIENT Stroke warning signs and symptoms How to activate emergency medical system (call 911). Medications prescribed at discharge. Need for follow-up after discharge. Personal risk factors for stroke. Pneumonia vaccine given:  Flu vaccine given:  My questions have been answered, the writing is legible, and I understand these instructions.  I will adhere to these goals & educational materials that have been provided to me after my discharge from the hospital.      My questions have been answered and I understand these instructions. I will adhere to these goals and the provided educational materials after my discharge from the hospital.  Patient/Caregiver Signature _______________________________ Date __________  Clinician Signature _______________________________________ Date __________  Please bring this form and your medication list with you to all your follow-up doctor's appointments.

## 2020-08-07 NOTE — Progress Notes (Signed)
Patient discharged off of unit with all belongings. Discharge papers/instructions explained by physician assistant to family. Patient and family have no further questions at time of discharge. No complications noted at this time.  Jaymin Waln L Jinnie Onley  

## 2020-08-08 ENCOUNTER — Encounter: Payer: Self-pay | Admitting: Physical Medicine & Rehabilitation

## 2020-08-09 DIAGNOSIS — Z9181 History of falling: Secondary | ICD-10-CM | POA: Diagnosis not present

## 2020-08-09 DIAGNOSIS — I69320 Aphasia following cerebral infarction: Secondary | ICD-10-CM | POA: Diagnosis not present

## 2020-08-09 DIAGNOSIS — Z794 Long term (current) use of insulin: Secondary | ICD-10-CM | POA: Diagnosis not present

## 2020-08-09 DIAGNOSIS — I129 Hypertensive chronic kidney disease with stage 1 through stage 4 chronic kidney disease, or unspecified chronic kidney disease: Secondary | ICD-10-CM | POA: Diagnosis not present

## 2020-08-09 DIAGNOSIS — I69390 Apraxia following cerebral infarction: Secondary | ICD-10-CM | POA: Diagnosis not present

## 2020-08-09 DIAGNOSIS — N1832 Chronic kidney disease, stage 3b: Secondary | ICD-10-CM | POA: Diagnosis not present

## 2020-08-09 DIAGNOSIS — I2510 Atherosclerotic heart disease of native coronary artery without angina pectoris: Secondary | ICD-10-CM | POA: Diagnosis not present

## 2020-08-09 DIAGNOSIS — R001 Bradycardia, unspecified: Secondary | ICD-10-CM | POA: Diagnosis not present

## 2020-08-09 DIAGNOSIS — E1122 Type 2 diabetes mellitus with diabetic chronic kidney disease: Secondary | ICD-10-CM | POA: Diagnosis not present

## 2020-08-09 DIAGNOSIS — Z7902 Long term (current) use of antithrombotics/antiplatelets: Secondary | ICD-10-CM | POA: Diagnosis not present

## 2020-08-09 DIAGNOSIS — I69351 Hemiplegia and hemiparesis following cerebral infarction affecting right dominant side: Secondary | ICD-10-CM | POA: Diagnosis not present

## 2020-08-12 DIAGNOSIS — I69320 Aphasia following cerebral infarction: Secondary | ICD-10-CM | POA: Diagnosis not present

## 2020-08-12 DIAGNOSIS — I69390 Apraxia following cerebral infarction: Secondary | ICD-10-CM | POA: Diagnosis not present

## 2020-08-12 DIAGNOSIS — I129 Hypertensive chronic kidney disease with stage 1 through stage 4 chronic kidney disease, or unspecified chronic kidney disease: Secondary | ICD-10-CM | POA: Diagnosis not present

## 2020-08-12 DIAGNOSIS — E1122 Type 2 diabetes mellitus with diabetic chronic kidney disease: Secondary | ICD-10-CM | POA: Diagnosis not present

## 2020-08-12 DIAGNOSIS — N1832 Chronic kidney disease, stage 3b: Secondary | ICD-10-CM | POA: Diagnosis not present

## 2020-08-12 DIAGNOSIS — I69351 Hemiplegia and hemiparesis following cerebral infarction affecting right dominant side: Secondary | ICD-10-CM | POA: Diagnosis not present

## 2020-08-13 ENCOUNTER — Encounter: Payer: Self-pay | Admitting: Neurology

## 2020-08-13 ENCOUNTER — Telehealth: Payer: Self-pay

## 2020-08-13 ENCOUNTER — Ambulatory Visit (INDEPENDENT_AMBULATORY_CARE_PROVIDER_SITE_OTHER): Payer: Medicare Other | Admitting: Neurology

## 2020-08-13 ENCOUNTER — Other Ambulatory Visit: Payer: Self-pay

## 2020-08-13 VITALS — BP 140/78 | HR 66 | Ht 73.0 in | Wt 218.4 lb

## 2020-08-13 DIAGNOSIS — H532 Diplopia: Secondary | ICD-10-CM | POA: Diagnosis not present

## 2020-08-13 DIAGNOSIS — R260 Ataxic gait: Secondary | ICD-10-CM | POA: Diagnosis not present

## 2020-08-13 DIAGNOSIS — I639 Cerebral infarction, unspecified: Secondary | ICD-10-CM

## 2020-08-13 DIAGNOSIS — G4733 Obstructive sleep apnea (adult) (pediatric): Secondary | ICD-10-CM | POA: Diagnosis not present

## 2020-08-13 MED ORDER — CLOPIDOGREL BISULFATE 75 MG PO TABS: 75.0000 mg | ORAL_TABLET | Freq: Every day | ORAL | 11 refills | Status: AC

## 2020-08-13 NOTE — Progress Notes (Signed)
[facility] [address] [place]. [place] [address] [phone]       OFFICE FOLLOW-UP NOTE  [name] Date of Birth:  [birth year] Medical Record Number:  [id number]   HPI: [name] is a 70 year old Caucasian male seen today for initial office visit following hospital admission for stroke and TIA in March and May 2022.  Is accompanied by his wife.  History is obtained from them and review of electronic medical records.  I personally reviewed available imaging films in PACS.  He has past medical history of diabetes, hypertension, hyperlipidemia, coronary artery disease, arthritis.  He initially presented on 05/27/2020 to [facility] long hospital with sudden onset of aphasia with difficulty seeing on the right side as well.  His blood pressure on initial evaluation was high with systolic being in the 211H but symptoms did not last long.  CT scan showed a left parietal hypodensity which was suggestive of age-indeterminate infarct.  MRI subsequently showed a old left parietal infarct and MRA showed no LVO but stenosis of proximal right P2 segment which was asymptomatic.  LDL cholesterol is 1 1 9  mg percent hemoglobin A1c 8.4.  2D echo showed ejection fraction of 55 to 60% without cardiac source of embolism.  Left atrial size is moderately dilated.  He was diagnosed with a left hemispheric TIA in placed on Plavix.  He had outpatient TEE done on 06/17/2020 which showed no left atrial thrombus or PFO.  He had a loop recorder inserted as an outpatient and so for paroxysmal A. fib has not been found.  He however presented back on 07/21/2020 with sudden onset of dizziness and diplopia with left 6th nerve palsy.  CTA head was unremarkable but MRI scan showed small acute infarcts at the left pontomedullary junction in the left cerebral white matter.  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  Carotid ultrasound showed no  extracranial stenosis.  Echocardiogram was not repeated.  LDL cholesterol was 88 mg percent and hemoglobin A1c was 8.5.  Patient was switched from Plavix to aspirin 81 mg and Brilinta 90 mg twice daily for 4 weeks.  Patient was to inpatient rehab and has been discharging home for a week.  He states his gait and balance are improved is walking a lot better.  He still has a right foot drop but wears an ankle brace which helps him walk.  He still has diplopia however and has to close 1 eye.  He had 1 visit with home PT and OT and was not impressed and prefers that I arrange outpatient therapies for him.  Patient was screened for participation in the sleep smart study during his recent hospitalization and tested positive on the NOx 3 monitor however he was a screen failure as he was unable to tolerate the CPAP mask.  He states he is tolerating Brilinta and aspirin well without bruising or bleeding.  Blood pressures under good control.  ROS:   14 system review of systems is positive for double vision, weakness, dizziness, gait imbalance and all other systems negative  PMH:  Past Medical History:  Diagnosis Date   Angina    NO ANGINA SINCE CABG   Arthritis    right ankle   Chronic daily headache    "@ least every other day""improved since heart surgery"   Coronary artery disease    PT [facility] - DR. TILLEY IS HIS CARDIOLOGIST  Diabetes mellitus    Lantus x 10 yrs   Gout    History of kidney stones 09-08-12   multiple times with some lithotripsies   Hyperlipemia    Hypertension     Social History:  Social History   Socioeconomic History   Marital status: Married    Spouse name: Mitchell Lambert   Number of children: Not on file   Years of education: Not on file   Highest education level: Not on file  Occupational History   Not on file  Tobacco Use   Smoking status: Never   Smokeless tobacco: Never  Vaping Use   Vaping Use: Never used  Substance and Sexual  Activity   Alcohol use: Yes    Comment:  OCCAS BEER  OR MIXED DRINK LAST MARIJUANA COUPLE MONTHS AGO   Drug use: Yes    Types: Marijuana    Comment: last marijuana use ~ 2010; "very rare". since   Sexual activity: Yes  Other Topics Concern   Not on file  Social History Narrative   Lives with wife   Right Handed   Drinks soda every other day   Social Determinants of Health   Financial Resource Strain: Not on file  Food Insecurity: Not on file  Transportation Needs: Not on file  Physical Activity: Not on file  Stress: Not on file  Social Connections: Not on file  Intimate Partner Violence: Not on file    Medications:   Current Outpatient Medications on File Prior to Visit  Medication Sig Dispense Refill   ACCU-CHEK GUIDE test strip USE TO TEST BLOOD SUGAR 2 TO 3 TIMES A DAY  5   allopurinol (ZYLOPRIM) 300 MG tablet Take 300 mg by mouth daily.     amLODipine (NORVASC) 5 MG tablet TAKE 1 TABLET(5 MG) BY MOUTH DAILY (Patient taking differently: Take 5 mg by mouth daily.) 90 tablet 3   aspirin EC 81 MG EC tablet Take 1 tablet (81 mg total) by mouth daily. Swallow whole.     atorvastatin (LIPITOR) 40 MG tablet Take 40 mg by mouth daily.     B-D ULTRAFINE III SHORT PEN 31G X 8 MM MISC USE UTD WITH FLEXPEN.  3   Baclofen 5 MG TABS Take 5 mg by mouth 3 (three) times daily. 90 tablet 0   Biotin 5000 MCG TABS Take 5,000 mcg by mouth daily.      fish oil-omega-3 fatty acids 1000 MG capsule Take 1 g by mouth 2 (two) times daily.     HYDROcodone-acetaminophen (NORCO/VICODIN) 5-325 MG tablet Take 1 tablet by mouth every 12 (twelve) hours as needed for severe pain. 10 tablet 0   insulin aspart (NOVOLOG FLEXPEN) 100 UNIT/ML FlexPen Inject 5 Units into the skin as needed for high blood sugar. If you eat more than 50% of your meal and if blood sugar is >120 15 mL 11   Insulin Glargine (BASAGLAR KWIKPEN) 100 UNIT/ML Inject 70 Units into the skin in the morning. In AM     magnesium gluconate  (MAGONATE) 500 MG tablet Take 2 tablets (1,000 mg total) by mouth at bedtime. 60 tablet 0   melatonin 3 MG TABS tablet Take 1 tablet (3 mg total) by mouth at bedtime. 30 tablet 0   Multiple Vitamins-Minerals (MULTIVITAMIN WITH MINERALS) tablet Take 1 tablet by mouth daily.     nitroGLYCERIN (NITROSTAT) 0.4 MG SL tablet Place 0.4 mg under the tongue as needed for chest pain.     pantoprazole (PROTONIX) 40 MG  tablet Take 1 tablet (40 mg total) by mouth daily. 30 tablet 0   ticagrelor (BRILINTA) 90 MG TABS tablet Take 1 tablet (90 mg total) by mouth 2 (two) times daily. 60 tablet 0   traMADol (ULTRAM) 50 MG tablet Take 50 mg by mouth daily.     LORazepam (ATIVAN) 0.5 MG tablet Take 1 tablet (0.5 mg total) by mouth daily as needed for anxiety. 30 tablet    No current facility-administered medications on file prior to visit.    Allergies:   Allergies  Allergen Reactions   Gabapentin Other (See Comments)    "made me so dizzy I missed work for 2 days"   Metformin And Related Shortness Of Breath   Sulfa Antibiotics Shortness Of Breath and Rash   Azithromycin Other (See Comments)    not good for heart condition   Ciprofloxacin Other (See Comments)    tendon rupture   Lisinopril Cough   Losartan Potassium Other (See Comments)    hypotension   Olmesartan Cough   Simvastatin Cough   Amoxicillin Rash and Other (See Comments)    rash Did it involve swelling of the face/tongue/throat, SOB, or low BP? N Did it involve sudden or severe rash/hives, skin peeling, or any reaction on the inside of your mouth or nose?  Y Did you need to seek medical attention at a hospital or doctor's office? N When did it last happen?  "years and years ago"     If all above answers are "NO", may proceed with cephalosporin use.    Doxycycline Rash   Penicillins Rash    Physical Exam General: Mildly obese elderly Caucasian male d, seated, in no evident distress Head: head normocephalic and atraumatic.  Neck:  supple with no carotid or supraclavicular bruits Cardiovascular: regular rate and rhythm, no murmurs Musculoskeletal: no deformity except mild right foot drop Skin:  no rash/petichiae Vascular:  Normal pulses all extremities Vitals:   08/13/20 0945  BP: 140/78  Pulse: 66   Neurologic Exam Mental Status: Awake and fully alert. Oriented to place and time. Recent and remote memory intact. Attention span, concentration and fund of knowledge appropriate. Mood and affect appropriate.  Cranial Nerves: Fundoscopic exam reveals sharp disc margins. Pupils equal, briskly reactive to light. Extraocular movements full except for left 6th nerve palsy with subjective diplopia on left lateral gaze.  No nystagmus.  Visual fields full to confrontation. Hearing intact. Facial sensation intact. Face, tongue, palate moves normally and symmetrically.  Motor: Normal bulk and tone. Normal strength in all tested extremity muscles except mild weakness of right grip and intrinsic hand muscles orbits left over right upper extremity.  Mild right ankle dorsiflexor weakness with foot drop.  Wearing a  ankle brace on the right. Sensory.: intact to touch ,pinprick .position and vibratory sensation.  Coordination: Rapid alternating movements normal in all extremities. Finger-to-nose and heel-to-shin performed accurately bilaterally. Gait and Station: Arises from chair without difficulty. Stance is normal. Gait demonstrates slight dragging of the right foot with right foot brace.  Unsteady on a narrow base and unable to walk tandem  reflexes: 1+ and symmetric. Toes downgoing.   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  Vascular risk factors of hypertension, hyperlipidemia, coronary artery disease, sleep apnea and  diabetes.  PLAN: I had a long d/w patient and his wife  about his recent [place] stroke, risk for recurrent stroke/TIAs, personally independently reviewed imaging studies and stroke evaluation results and answered questions.Continue aspirin 81 mg daily and Brilinta 90 mg twice daily for 1 more week and then stop both and change to Plavix 75 mg daily for secondary stroke prevention and maintain strict control of hypertension with blood pressure goal below 130/90, diabetes with hemoglobin A1c goal below 6.5% and lipids with LDL cholesterol goal below 70 mg/dL. I also advised the patient to eat a healthy diet with plenty of whole grains, cereals, fruits and vegetables, exercise regularly and maintain ideal body weight Refer for outpatient physical and occupational therapy and referral to sleep physician for treatment options for sleep apnea discussion.  Followup in the future with my nurse practitioner Janett Billow in 3 months or call earlier if necessary. Greater than 50% of time during this 35 minute visit was spent on counseling,explanation of diagnosis, TIA and cryptogenic stroke planning of further management, discussion with patient and family and coordination of care Antony Contras, MD Note: This document was prepared with digital dictation and possible smart phrase technology. Any transcriptional errors that result from this process are unintentional

## 2020-08-13 NOTE — Telephone Encounter (Signed)
Referral for PT/OT sent to [facility]. P: [phone].

## 2020-08-13 NOTE — Patient Instructions (Signed)
I had a long d/w patient and his wife  about his recent [place] stroke, risk for recurrent stroke/TIAs, personally independently reviewed imaging studies and stroke evaluation results and answered questions.Continue aspirin 81 mg daily and Brilinta 90 mg twice daily for 1 more week and then stop both and change to Plavix 75 mg daily for secondary stroke prevention and maintain strict control of hypertension with blood pressure goal below 130/90, diabetes with hemoglobin A1c goal below 6.5% and lipids with LDL cholesterol goal below 70 mg/dL. I also advised the patient to eat a healthy diet with plenty of whole grains, cereals, fruits and vegetables, exercise regularly and maintain ideal body weight Refer for outpatient physical and occupational therapy and referral to sleep physician for treatment options for sleep apnea discussion.  Followup in the future with my nurse practitioner Janett Billow in 3 months or call earlier if necessary.  Stroke Prevention Some medical conditions and behaviors are associated with a higher chance of having a stroke. You can help prevent a stroke by making nutrition, lifestyle,and other changes, including managing any medical conditions you may have. What nutrition changes can be made?  Eat healthy foods. You can do this by: Choosing foods high in fiber, such as fresh fruits and vegetables and whole grains. Eating at least 5 or more servings of fruits and vegetables a day. Try to fill half of your plate at each meal with fruits and vegetables. Choosing lean protein foods, such as lean cuts of meat, poultry without skin, fish, tofu, beans, and nuts. Eating low-fat dairy products. Avoiding foods that are high in salt (sodium). This can help lower blood pressure. Avoiding foods that have saturated fat, trans fat, and cholesterol. This can help prevent high cholesterol. Avoiding processed and premade foods. Follow your health care provider's specific guidelines for losing weight,  controlling high blood pressure (hypertension), lowering high cholesterol, and managing diabetes. These may include: Reducing your daily calorie intake. Limiting your daily sodium intake to 1,500 milligrams (mg). Using only healthy fats for cooking, such as olive oil, canola oil, or sunflower oil. Counting your daily carbohydrate intake. What lifestyle changes can be made? Maintain a healthy weight. Talk to your health care provider about your ideal weight. Get at least 30 minutes of moderate physical activity at least 5 days a week. Moderate activity includes brisk walking, biking, and swimming. Do not use any products that contain nicotine or tobacco, such as cigarettes and e-cigarettes. If you need help quitting, ask your health care provider. It may also be helpful to avoid exposure to secondhand smoke. Limit alcohol intake to no more than 1 drink a day for nonpregnant women and 2 drinks a day for men. One drink equals 12 oz of beer, 5 oz of wine, or 1 oz of hard liquor. Stop any illegal drug use. Avoid taking birth control pills. Talk to your health care provider about the risks of taking birth control pills if: You are over 6 years old. You smoke. You get migraines. You have ever had a blood clot. What other changes can be made? Manage your cholesterol levels. Eating a healthy diet is important for preventing high cholesterol. If cholesterol cannot be managed through diet alone, you may also need to take medicines. Take any prescribed medicines to control your cholesterol as told by your health care provider. Manage your diabetes. Eating a healthy diet and exercising regularly are important parts of managing your blood sugar. If your blood sugar cannot be managed through diet and exercise,  you may need to take medicines. Take any prescribed medicines to control your diabetes as told by your health care provider. Control your hypertension. To reduce your risk of stroke, try to keep  your blood pressure below 130/80. Eating a healthy diet and exercising regularly are an important part of controlling your blood pressure. If your blood pressure cannot be managed through diet and exercise, you may need to take medicines. Take any prescribed medicines to control hypertension as told by your health care provider. Ask your health care provider if you should monitor your blood pressure at home. Have your blood pressure checked every year, even if your blood pressure is normal. Blood pressure increases with age and some medical conditions. Get evaluated for sleep disorders (sleep apnea). Talk to your health care provider about getting a sleep evaluation if you snore a lot or have excessive sleepiness. Take over-the-counter and prescription medicines only as told by your health care provider. Aspirin or blood thinners (antiplatelets or anticoagulants) may be recommended to reduce your risk of forming blood clots that can lead to stroke. Make sure that any other medical conditions you have, such as atrial fibrillation or atherosclerosis, are managed. What are the warning signs of a stroke? The warning signs of a stroke can be easily remembered as BEFAST. B is for balance. Signs include: Dizziness. Loss of balance or coordination. Sudden trouble walking. E is for eyes. Signs include: A sudden change in vision. Trouble seeing. F is for face. Signs include: Sudden weakness or numbness of the face. The face or eyelid drooping to one side. A is for arms. Signs include: Sudden weakness or numbness of the arm, usually on one side of the body. S is for speech. Signs include: Trouble speaking (aphasia). Trouble understanding. T is for time. These symptoms may represent a serious problem that is an emergency. Do not wait to see if the symptoms will go away. Get medical help right away. Call your local emergency services (911 in the [place] Do not drive yourself to the hospital. Other signs  of stroke may include: A sudden, severe headache with no known cause. Nausea or vomiting. Seizure. Where to find more information For more information, visit: American Stroke Association: www.strokeassociation.org National Stroke Association: www.stroke.org Summary You can prevent a stroke by eating healthy, exercising, not smoking, limiting alcohol intake, and managing any medical conditions you may have. Do not use any products that contain nicotine or tobacco, such as cigarettes and e-cigarettes. If you need help quitting, ask your health care provider. It may also be helpful to avoid exposure to secondhand smoke. Remember BEFAST for warning signs of stroke. Get help right away if you or a loved one has any of these signs. This information is not intended to replace advice given to you by your health care provider. Make sure you discuss any questions you have with your healthcare provider. Document Revised: 01/29/2017 Document Reviewed: 03/24/2016 Elsevier Patient Education  2021 [employer].

## 2020-08-14 DIAGNOSIS — E1122 Type 2 diabetes mellitus with diabetic chronic kidney disease: Secondary | ICD-10-CM | POA: Diagnosis not present

## 2020-08-14 DIAGNOSIS — I69351 Hemiplegia and hemiparesis following cerebral infarction affecting right dominant side: Secondary | ICD-10-CM | POA: Diagnosis not present

## 2020-08-14 DIAGNOSIS — I69390 Apraxia following cerebral infarction: Secondary | ICD-10-CM | POA: Diagnosis not present

## 2020-08-14 DIAGNOSIS — N1832 Chronic kidney disease, stage 3b: Secondary | ICD-10-CM | POA: Diagnosis not present

## 2020-08-14 DIAGNOSIS — I129 Hypertensive chronic kidney disease with stage 1 through stage 4 chronic kidney disease, or unspecified chronic kidney disease: Secondary | ICD-10-CM | POA: Diagnosis not present

## 2020-08-14 DIAGNOSIS — I69320 Aphasia following cerebral infarction: Secondary | ICD-10-CM | POA: Diagnosis not present

## 2020-08-14 NOTE — Progress Notes (Signed)
[facility] Summary Report / Loop Recorder 

## 2020-08-15 DIAGNOSIS — I69351 Hemiplegia and hemiparesis following cerebral infarction affecting right dominant side: Secondary | ICD-10-CM | POA: Diagnosis not present

## 2020-08-15 DIAGNOSIS — E1122 Type 2 diabetes mellitus with diabetic chronic kidney disease: Secondary | ICD-10-CM | POA: Diagnosis not present

## 2020-08-15 DIAGNOSIS — N1832 Chronic kidney disease, stage 3b: Secondary | ICD-10-CM | POA: Diagnosis not present

## 2020-08-15 DIAGNOSIS — I69390 Apraxia following cerebral infarction: Secondary | ICD-10-CM | POA: Diagnosis not present

## 2020-08-15 DIAGNOSIS — I69320 Aphasia following cerebral infarction: Secondary | ICD-10-CM | POA: Diagnosis not present

## 2020-08-15 DIAGNOSIS — I129 Hypertensive chronic kidney disease with stage 1 through stage 4 chronic kidney disease, or unspecified chronic kidney disease: Secondary | ICD-10-CM | POA: Diagnosis not present

## 2020-08-16 DIAGNOSIS — E1122 Type 2 diabetes mellitus with diabetic chronic kidney disease: Secondary | ICD-10-CM | POA: Diagnosis not present

## 2020-08-16 DIAGNOSIS — I69351 Hemiplegia and hemiparesis following cerebral infarction affecting right dominant side: Secondary | ICD-10-CM | POA: Diagnosis not present

## 2020-08-16 DIAGNOSIS — I129 Hypertensive chronic kidney disease with stage 1 through stage 4 chronic kidney disease, or unspecified chronic kidney disease: Secondary | ICD-10-CM | POA: Diagnosis not present

## 2020-08-16 DIAGNOSIS — N1832 Chronic kidney disease, stage 3b: Secondary | ICD-10-CM | POA: Diagnosis not present

## 2020-08-16 DIAGNOSIS — I69390 Apraxia following cerebral infarction: Secondary | ICD-10-CM | POA: Diagnosis not present

## 2020-08-16 DIAGNOSIS — I69320 Aphasia following cerebral infarction: Secondary | ICD-10-CM | POA: Diagnosis not present

## 2020-08-20 ENCOUNTER — Ambulatory Visit: Payer: Medicare Other | Attending: Family Medicine | Admitting: Physical Therapy

## 2020-08-20 ENCOUNTER — Encounter: Payer: Self-pay | Admitting: Physical Medicine & Rehabilitation

## 2020-08-20 ENCOUNTER — Encounter (HOSPITAL_BASED_OUTPATIENT_CLINIC_OR_DEPARTMENT_OTHER): Payer: Medicare Other | Admitting: Physical Medicine & Rehabilitation

## 2020-08-20 ENCOUNTER — Other Ambulatory Visit: Payer: Self-pay

## 2020-08-20 ENCOUNTER — Ambulatory Visit: Payer: Medicare Other | Admitting: Occupational Therapy

## 2020-08-20 VITALS — BP 130/80 | HR 62 | Temp 98.3°F | Ht 73.0 in | Wt 225.0 lb

## 2020-08-20 DIAGNOSIS — M6281 Muscle weakness (generalized): Secondary | ICD-10-CM

## 2020-08-20 DIAGNOSIS — I639 Cerebral infarction, unspecified: Secondary | ICD-10-CM | POA: Diagnosis not present

## 2020-08-20 DIAGNOSIS — R2689 Other abnormalities of gait and mobility: Secondary | ICD-10-CM

## 2020-08-20 DIAGNOSIS — R2681 Unsteadiness on feet: Secondary | ICD-10-CM

## 2020-08-20 DIAGNOSIS — R41842 Visuospatial deficit: Secondary | ICD-10-CM

## 2020-08-20 DIAGNOSIS — G8191 Hemiplegia, unspecified affecting right dominant side: Secondary | ICD-10-CM

## 2020-08-20 DIAGNOSIS — R278 Other lack of coordination: Secondary | ICD-10-CM

## 2020-08-20 NOTE — Therapy (Signed)
[facility] [address] [place], [place], [address] Phone: [phone]   Fax:  [phone]  Physical Therapy Evaluation  Patient Details  Name: Mitchell Lambert MRN: [id number] Date of Birth: 1950/11/13 Referring Provider (PT): Garvin Fila, MD   Encounter Date: 08/20/2020   PT End of Session - 08/20/20 0958     Visit Number 1    Number of Visits 17    Date for PT Re-Evaluation 10/15/20    Authorization Type Medicare - will need 10th visit PN    Progress Note Due on Visit 10    PT Start Time 1000    PT Stop Time 1045    PT Time Calculation (min) 45 min    Activity Tolerance Patient tolerated treatment well    Behavior During Therapy [facility] for tasks assessed/performed             Past Medical History:  Diagnosis Date   Angina    NO ANGINA SINCE CABG   Arthritis    right ankle   Chronic daily headache    "@ least every other day""improved since heart surgery"   Coronary artery disease    PT [place] CABG SURGERY - DR. TILLEY IS HIS CARDIOLOGIST   Diabetes mellitus    Lantus x 10 yrs   Gout    History of kidney stones 09-08-12   multiple times with some lithotripsies   Hyperlipemia    Hypertension     Past Surgical History:  Procedure Laterality Date   BACK SURGERY     microsurgery: spinal stenosis -lumbar   bone spur  1990's   right great toe; "probably related to gout"   BUBBLE STUDY  06/17/2020   Procedure: BUBBLE STUDY;  Surgeon: Lelon Perla, MD;  Location: [facility];  Service: Cardiovascular;;   CARDIAC CATHETERIZATION  04/27/11   CATARACT EXTRACTION W/ INTRAOCULAR LENS IMPLANT  1990's   right; "lens was replaced twice over 3 months; lens is actually over iris"   CORONARY ARTERY BYPASS GRAFT  04/30/2011   Procedure: CORONARY ARTERY BYPASS GRAFTING (CABG);  Surgeon: Melrose Nakayama, MD;  Location: [facility];  Service: Open Heart Surgery;  Laterality: N/A;,x4  vessels   CYSTOSCOPY W/ URETERAL STENT PLACEMENT Right 08/19/2012   Procedure: CYSTOSCOPY WITH RETROGRADE PYELOGRAM/URETERAL STENT PLACEMENT;  Surgeon: Molli Hazard, MD;  Location: [facility];  Service: Urology;  Laterality: Right;   CYSTOSCOPY WITH RETROGRADE PYELOGRAM, URETEROSCOPY AND STENT PLACEMENT Left 07/17/2013   Procedure: CYSTOSCOPY, LEFT URETEROSCOPY, BASKET EXTRACTION OF STONE, INSERTION OF LEFT URETERAL STENT, ;  Surgeon: Jorja Loa, MD;  [facility];  Service: Urology;  Laterality: Left;   CYSTOSCOPY/RETROGRADE/URETEROSCOPY Right 09/12/2012   Procedure: CYSTOSCOPY/RETROGRADE/URETEROSCOPY;  Surgeon: Franchot Gallo, MD;  [facility];  Service: Urology;  Laterality: Right;   HOLMIUM LASER APPLICATION Right 9/67/8938   Procedure: HOLMIUM LASER APPLICATION;  Surgeon: Franchot Gallo, MD;  [facility];  Service: Urology;  Laterality: Right;   HOLMIUM LASER APPLICATION Left 03/02/7508   Procedure: HOLMIUM LASER APPLICATION;  Surgeon: Jorja Loa, MD;  [facility];  Service: Urology;  Laterality: Left;   KIDNEY STONE SURGERY     "probably 3 times"   LEFT HEART CATHETERIZATION WITH CORONARY ANGIOGRAM N/A 04/28/2011   Procedure: LEFT HEART CATHETERIZATION WITH CORONARY ANGIOGRAM;  Surgeon: Jacolyn Reedy, MD;  Location: [facility] CATH [facility];  Service: Cardiovascular;  Laterality: N/A;   LITHOTRIPSY     "many; probably  5 times"   RADIAL ARTERY HARVEST  04/30/2011   Procedure: RADIAL ARTERY HARVEST;  Surgeon: Mel[place] Nakayama, MD;  Location: [facility];  Service: Open Heart Surgery;  Laterality: Left;   RETINAL DETACHMENT SURGERY  1990's   right eye; "made me have an early cataract"   TEE WITHOUT CARDIOVERSION N/A 06/17/2020   Procedure: TRANSESOPHAGEAL ECHOCARDIOGRAM (TEE);  Surgeon: Lelon Perla, MD;  Location: [facility] ENDOSCOPY;  Service: Cardiovascular;  Laterality: N/A;   VASECTOMY      There were no vitals filed for this visit.    Subjective  Assessment - 08/20/20 1006     Subjective Pt reports it was "slow going at home". On first follow-up with neurologist asked for OPPT. L eye vision is still a problem which is also affecting his balance and mobility. Pt is to get a neurological opthamologist referral at [facility]. Pt has been staying at a friend's house that is on one level. Steps are an issue.    Patient is accompained by: Family member    Pertinent History achilles tendon tear on L    Limitations Standing;Sitting;House hold activities    How long can you stand comfortably? No issues    How long can you walk comfortably? In house only    Patient Stated Goals Return to their own home, play drums again    Currently in Pain? No/denies                [facility] PT Assessment - 08/20/20 0001       Assessment   Medical Diagnosis I63.9 (ICD-10-CM) - Left pontine stroke [facility])    Referring Provider (PT) Garvin Fila, MD    Onset Date/Surgical Date 07/25/20   In hospital and [facility] 5/26 to 6/8   Prior Therapy [facility]      Precautions   Precautions Fall    Precaution Comments L visual field deficits      Restrictions   Weight Bearing Restrictions No      Balance Screen   Has the patient fallen in the past 6 months No    Has the patient had a decrease in activity level because of a fear of falling?  No    Is the patient reluctant to leave their home because of a fear of falling?  No      Home Environment   Living Environment Private residence    Living Arrangements Spouse/significant other   currently living at friend's house which is one level but plans to go back to their own home   Available Help at Discharge Family    Type of [place] to enter    Entrance Stairs-Number of Steps 7    [place] Two level    Alternate Level Stairs-Number of Steps [place] None;Walker - 2 wheels;Cane - single point   to get grab bars     Prior Function   Vocation Full time employment    Presenter, broadcasting    Leisure Drummer on weekends      Cognition   Overall Cognitive Status Within Functional Limits for tasks assessed      Observation/Other Assessments   Focus on Therapeutic Outcomes (FOTO)  Not captured      Sensation   Light Touch Appears Intact      Strength   Right Hip Flexion 4/5    Right Hip Extension 3-/5    Right Hip ABduction 4-/5  Right Hip ADduction 4/5    Right Knee Flexion 4-/5    Right Knee Extension 4-/5    Right Ankle Dorsiflexion 2+/5    Right Ankle Plantar Flexion 3-/5      Transfers   Transfers Sit to Stand;Stand to Sit    Sit to Stand 5: Supervision    Sit to Stand Details (indicate cue type and reason) Decreased R LE weight shift on standing    Stand to Sit 5: Supervision      Ambulation/Gait   Ambulation Distance (Feet) 300 Feet    Assistive device Rolling walker    Gait Pattern Step-through pattern;Decreased stance time - right;Decreased step length - left;Decreased dorsiflexion - right;Decreased weight shift to right;Right foot flat   with use of R posterior [place]   Ambulation Surface Level;Indoor    Gait velocity 0.5 m/s    Stairs Yes    Stairs Assistance 4: Min assist    Stair Management Technique Two rails;Step to pattern    Number of Stairs 4    Height of Stairs 6      Standardized Balance Assessment   Standardized Balance Assessment Berg Balance Test;Dynamic Gait Index      Berg Balance Test   Sit to Stand Able to stand without using hands and stabilize independently    Standing Unsupported Able to stand 2 minutes with supervision    Sitting with Back Unsupported but Feet Supported on Floor or Stool Able to sit safely and securely 2 minutes    Stand to Sit Sits safely with minimal use of hands    Transfers Able to transfer safely, definite need of hands    Standing Unsupported with Eyes Closed Able to stand 10 seconds safely    Standing Unsupported with Feet Together Able to place feet  together independently and stand 1 minute safely    From Standing, Reach Forward with Outstretched Arm Can reach confidently >25 cm (10")    From Standing Position, Pick up Object from Floor Able to pick up shoe, needs supervision    From Standing Position, Turn to Look Behind Over each Shoulder Looks behind one side only/other side shows less weight shift    Turn 360 Degrees Needs close supervision or verbal cueing   2 minor LOBs   Standing Unsupported, Alternately Place Feet on Step/Stool Able to complete >2 steps/needs minimal assist    Standing Unsupported, One Foot in [employer] balance while stepping or standing    Standing on One Leg Tries to lift leg/unable to hold 3 seconds but remains standing independently    Total Score 39    Berg comment: <45/56 indicates high fall risk      Dynamic Gait Index   Level Surface Mild Impairment    Change in Gait Speed Normal    Gait with Horizontal Head Turns Mild Impairment    Gait with Vertical Head Turns Mild Impairment    Gait and Pivot Turn Mild Impairment    Step Over Obstacle Mild Impairment    Step Around Obstacles Normal    Steps Moderate Impairment    Total Score 17    DGI comment: <19/24 is a fall risk                        Objective measurements completed on examination: See above findings.               PT Education - 08/20/20 1651     Education Details  Exam findings, POC, stretching R foot    Person(s) Educated Patient;Spouse    Methods Explanation    Comprehension Verbalized understanding              PT Short Term Goals - 08/20/20 1651       PT SHORT TERM GOAL #1   Title Pt will be independent with initial HEP    Time 4    Period Weeks    Status New    Target Date 09/17/20      PT SHORT TERM GOAL #2   Title Pt will be able to demo at least 3/5 R ankle DF to play on his drums    Time 4    Period Weeks    Status New    Target Date 09/17/20      PT SHORT TERM GOAL #3   Title  Pt will be able to walk >500' with LRAD and SBA for improved home/community mobility    Time 4    Period Weeks    Status New    Target Date 09/17/20      PT SHORT TERM GOAL #4   Title Pt will be able to ascend/descend at least 20 steps with railing and SBA for safe return home    Baseline Performed 4 steps with bilat rails and min A    Time 4    Period Weeks    Status New    Target Date 09/17/20               PT Long Term Goals - 08/20/20 1703       PT LONG TERM GOAL #1   Title Pt will be independent with advanced HEP for strengthening and balance    Time 8    Period Weeks    Status New    Target Date 10/15/20      PT LONG TERM GOAL #2   Title Pt will have improved Berg Balance Score of at least 45/56 to indicate decreased fall risk    Baseline 39/56    Time 8    Period Weeks    Status New    Target Date 10/15/20      PT LONG TERM GOAL #3   Title Pt will have improved DGI score to at least 19/24 to demo decreased fall risk    Baseline 17/24    Time 8    Period Weeks    Status New    Target Date 10/15/20      PT LONG TERM GOAL #4   Title Pt will be able to amb at least 1000' with LRAD mod I on level and unlevel surfaces for community amb    Baseline Needs use of RW    Time 8    Period Weeks    Status New    Target Date 10/15/20      PT LONG TERM GOAL #5   Title Pt will be able to use R foot/ankle for drumming to return to leisure activities    Time 8    Period Weeks    Status New    Target Date 10/15/20                    Plan - 08/20/20 1054     Clinical Impression Statement Mitchell Lambert is a 70 y/o M presenting to OPPT s/p L pontine CVA. Pt mostly complains of decreased L side vision (see OT eval), and R LE weakness resulting in  decreased balance. PMH significant for prior L Achilles tendon tear, CAD s/p CABG, HTN, asthma, type 2 diabetes, CKD stage III, HLD, remote R retinal detachment, and HTN. On assessment, pt with mild improvement in  Berg Balance score from 36/56 to 39/56 since being seen in [facility] and DGI of 17/24 placing him at high fall risk. Greatest goal is to return to his own home where he has to do multiple steps safely. Pt would highly benefit from PT to optimize his level of function for improved home and community mobility with return to leisure activities.    Personal Factors and Comorbidities Age;Fitness;Time since onset of injury/illness/exacerbation;Comorbidity 1;Past/Current Experience    Comorbidities L Achilles tendon tear, chronic R LE edema, CAD s/p CABG, HTN, asthma, type 2 diabetes, CKD stage III, HLD, remote R retinal detachment, HTN, CVA.    Examination-Activity Limitations Squat;Stairs;Caring for Others;Locomotion Level    Examination-Participation Restrictions Cleaning;Community Activity;Driving;Occupation;Shop;Yard Work    Merchant [employer] officer Evolving/Moderate complexity    Clinical Decision Making Moderate    Rehab Potential Good    PT Frequency 2x / week    PT Duration 8 weeks    PT Treatment/Interventions ADLs/Self Care Home Management;Aquatic Therapy;Cryotherapy;Electrical Stimulation;Moist Heat;DME Instruction;Gait training;Stair training;Functional mobility training;Therapeutic activities;Therapeutic exercise;Balance training;Neuromuscular re-education;Manual techniques;Patient/family education;Passive range of motion;Taping;Joint Manipulations    PT Next Visit Plan Provide stretches for R ankle. Work on general R LE strengthening for [employer]. Work on balance. Trial walking without AFO as pt does have some ankle movement.    Consulted and Agree with Plan of Care Patient;Family member/caregiver    Family Member Consulted Wife             Patient will benefit from skilled therapeutic intervention in order to improve the following deficits and impairments:  Abnormal gait, Decreased range of motion, Difficulty walking, Increased fascial restricitons, Impaired tone, Decreased activity  tolerance, Decreased balance, Decreased mobility, Decreased strength, Increased edema  Visit Diagnosis: Muscle weakness (generalized)  Other abnormalities of gait and mobility  Unsteadiness on feet  Left pontine stroke [facility])     Problem List Patient Active Problem List   Diagnosis Date Noted   Left pontine stroke ([place]) 07/25/2020   Acute right hemiparesis ([place]) 07/25/2020   Essential hypertension    History of CVA (cerebrovascular accident)    CKD (chronic kidney disease), stage II    Diabetes mellitus type 2 in obese (HCC)    Dyslipidemia    CVA (cerebral vascular accident) ([place]) 07/21/2020   Cryptogenic stroke ([place]) 06/18/2020   Acute-on-chronic kidney injury ([place]) 05/28/2020   Stroke ([place]) 05/27/2020   Elevated troponin 01/13/2019   COVID-19 virus infection 01/13/2019   S/P CABG (coronary artery bypass graft)    CAD (coronary artery disease) 04/28/2011   Insulin dependent type 2 diabetes mellitus, controlled ([place])    Hyperlipidemia    Hypertension    Obesity (BMI 30-39.9)    Nephrolithiasis, uric acid     Artist Bloom April Ma L [place] PT, DPT 08/20/2020, 5:07 PM  [facility] [address] [place], [place], [address] Phone: [phone]   Fax:  [phone]  Name: Mitchell Lambert MRN: [id number] Date of Birth: [birth year]

## 2020-08-20 NOTE — Progress Notes (Signed)
Subjective:    Patient ID: Mitchell Lambert, male    DOB: 05-Aug-1950, 70 y.o.   MRN: [id number] 70 y.o. male with history of CKD 2, CAD, T2DM, CVA 04/2020 with transient right vision changes and aphasia who was placed on Plavix.  He was admitted on 07/22/2018 with left facial droop, dizziness, nausea and double vision.  MRI brain done revealing small acute infarct in left pontine medullary junction.  Patient reported that symptoms had started the day before and was out of window for tPA.     Dr. Leonie Man expressed concerns of cardioembolic stroke and recommended DAPT of aspirin and Brilinta x4 weeks followed by aspirin alone.  She he had issues with bradycardia therefore metoprolol was discontinued and BP meds held to avoid hypoperfusion.  Therapy initiated and patient was noted to have limitations due to Right ataxia, balance deficits, mild cognitive deficits and diplopia. CIR was recommended due to functional decline.  Admit date: 07/25/2020 Discharge date: 08/07/2020 HPI  Mod I dressing  Needs assist with shower transfers No falls No numbness tingling No problems with swallowing just slow Asking about double vision Has seen Optometrist, referral to [facility] for diplopia  Patient has no significant pain in the ankle.  He had a gout flareup in the right foot and ankle during his rehab hospitalization.  He has 3-4 stools a week uses Metamucil.  He has no bladder issues other than incomplete emptying.  He walks using a walker.  He has difficulty climbing the steps.  He is no longer driving.  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 For ADLs he needs assistance with bathing and meal prep Social history married he has 3 children ages 87 and 8  Lives in a two-level home 13 steps to the bedroom.  Currently staying with a friend in a single-story home  Pain Inventory Average Pain 2 Pain Right Now 2 My pain is  aching  LOCATION OF PAIN  ankle , back and hip  BOWEL Number of stools per week: 3-4 Oral laxative use Yes  Type of laxative magnisium Enema or suppository use No  History of colostomy No  Incontinent No   BLADDER Normal In and out cath, frequency n/a Able to self cath Yes  Bladder incontinence No  Frequent urination No  Leakage with coughing No  Difficulty starting stream No  Incomplete bladder emptying Yes    Mobility walk with assistance use a walker ability to climb steps?  yes do you drive?  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: date disabled 07/21/20 I need assistance with the following:  bathing and meal prep  Neuro/Psych weakness trouble walking dizziness  Prior Studies N/a  Physicians involved in your care N/a   Family History  Problem Relation Age of Onset   Cataracts Father    Stroke Father    Cataracts Mother    Heart disease Mother    Diabetes Paternal Aunt    Diabetes Paternal Grandmother    Amblyopia Neg Hx    Blindness Neg Hx    Glaucoma Neg Hx    Macular degeneration Neg Hx    Retinal detachment Neg Hx    Strabismus Neg Hx    Retinitis pigmentosa Neg Hx    Social History   Socioeconomic History   Marital status: Married  Spouse name: [name]   Number of children: Not on file   Years of education: Not on file   Highest education level: Not on file  Occupational History   Not on file  Tobacco Use   Smoking status: Never   Smokeless tobacco: Never  Vaping Use   Vaping Use: Never used  Substance and Sexual Activity   Alcohol use: Yes    Comment:  OCCAS BEER  OR MIXED DRINK LAST MARIJUANA COUPLE MONTHS AGO   Drug use: Yes    Types: Marijuana    Comment: last marijuana use ~ 2010; "very rare". since   Sexual activity: Yes  Other Topics Concern   Not on file  Social History Narrative   Lives with wife   Right Handed   Drinks soda every other day   Social  Determinants of Health   Financial Resource Strain: Not on file  Food Insecurity: Not on file  Transportation Needs: Not on file  Physical Activity: Not on file  Stress: Not on file  Social Connections: Not on file   Past Surgical History:  Procedure Laterality Date   BACK SURGERY     microsurgery: spinal stenosis -lumbar   bone spur  1990's   right great toe; "probably related to gout"   BUBBLE STUDY  06/17/2020   Procedure: BUBBLE STUDY;  Surgeon: Lelon Perla, MD;  Location: [place];  Service: Cardiovascular;;   CARDIAC CATHETERIZATION  04/27/11   CATARACT EXTRACTION W/ INTRAOCULAR LENS IMPLANT  1990's   right; "lens was replaced twice over 3 months; lens is actually over iris"   CORONARY ARTERY BYPASS GRAFT  04/30/2011   Procedure: CORONARY ARTERY BYPASS GRAFTING (CABG);  Surgeon: Melrose Nakayama, MD;  Location: [place];  Service: Open Heart Surgery;  Laterality: N/A;,x4 vessels   CYSTOSCOPY W/ URETERAL STENT PLACEMENT Right 08/19/2012   Procedure: CYSTOSCOPY WITH RETROGRADE PYELOGRAM/URETERAL STENT PLACEMENT;  Surgeon: Molli Hazard, MD;  [facility];  Service: Urology;  Laterality: Right;   CYSTOSCOPY WITH RETROGRADE PYELOGRAM, URETEROSCOPY AND STENT PLACEMENT Left 07/17/2013   Procedure: CYSTOSCOPY, LEFT URETEROSCOPY, BASKET EXTRACTION OF STONE, INSERTION OF LEFT URETERAL STENT, ;  Surgeon: Jorja Loa, MD;  [facility];  Service: Urology;  Laterality: Left;   CYSTOSCOPY/RETROGRADE/URETEROSCOPY Right 09/12/2012   Procedure: CYSTOSCOPY/RETROGRADE/URETEROSCOPY;  Surgeon: Franchot Gallo, MD;  [facility];  Service: Urology;  Laterality: Right;   HOLMIUM LASER APPLICATION Right 6/76/1950   Procedure: HOLMIUM LASER APPLICATION;  Surgeon: Franchot Gallo, MD;  [facility];  Service: Urology;  Laterality: Right;   HOLMIUM LASER APPLICATION Left 9/32/6712   Procedure: HOLMIUM LASER APPLICATION;  Surgeon: Jorja Loa, MD;  [facility];  Service: Urology;  Laterality: Left;   KIDNEY STONE SURGERY     "probably 3 times"   LEFT HEART CATHETERIZATION WITH CORONARY ANGIOGRAM N/A 04/28/2011   Procedure: LEFT HEART CATHETERIZATION WITH CORONARY ANGIOGRAM;  Surgeon: Jacolyn Reedy, MD;  [facility] C[facility] LAB;  Service: Cardiovascular;  Laterality: N/A;   LITHOTRIPSY     "many; probably 5 times"   RADIAL ARTERY HARVEST  04/30/2011   Procedure: RADIAL ARTERY HARVEST;  Surgeon: Melrose Nakayama, MD;  Location: [place];  Service: Open Heart Surgery;  Laterality: Left;   RETINAL DETACHMENT SURGERY  1990's   right eye; "made me have an early cataract"   TEE WITHOUT CARDIOVERSION N/A 06/17/2020   Procedure: TRANSESOPHAGEAL ECHOCARDIOGRAM (TEE);  Surgeon: Lelon Perla, MD;  Location: [facility] ENDOSCOPY;  Service: Cardiovascular;  Laterality: N/A;   VASECTOMY     Past Medical History:  Diagnosis Date   Angina    NO ANGINA SINCE CABG   Arthritis    right ankle   Chronic daily headache    "@ least every other day""improved since heart surgery"   Coronary artery disease    PT [place] CABG SURGERY - DR. TILLEY IS HIS CARDIOLOGIST   Diabetes mellitus    Lantus x 10 yrs   Gout    History of kidney stones 09-08-12   multiple times with some lithotripsies   Hyperlipemia    Hypertension    BP 130/80 (BP Location: Right Arm, Patient Position: Sitting, Cuff Size: Large)   Pulse 62   Temp 98.3 F (36.8 C) (Oral)   Ht 6\' 1"  (1.854 m)   Wt 225 lb (102.1 kg)   SpO2 96%   BMI 29.69 kg/m   Opioid Risk Score:   Fall Risk Score:  `1  Depression screen PHQ 2/9  Depression screen [facility] 2/9 03/31/2015 06/17/2011  Decreased Interest 0 0  Down, Depressed, Hopeless 0 0  PHQ - 2 Score 0 0      Review of Systems  Constitutional: Negative.   HENT: Negative.    Eyes: Negative.   Respiratory: Negative.    Cardiovascular: Negative.   Gastrointestinal: Negative.   Endocrine: Negative.   Genitourinary:  Negative.   Musculoskeletal:  Positive for back pain and gait problem.       Hip and ankle pain  Skin: Negative.   Neurological:  Positive for dizziness and weakness.  Hematological: Negative.   Psychiatric/Behavioral: Negative.        Objective:   Physical Exam Constitutional:      Appearance: Normal appearance. He is obese.  HENT:     Head: Normocephalic and atraumatic.  Eyes:     General: No visual field deficit.    Extraocular Movements: Extraocular movements intact.     Conjunctiva/sclera: Conjunctivae normal.     Pupils: Pupils are equal, round, and reactive to light.  Skin:    General: Skin is warm and dry.  Neurological:     Mental Status: He is alert and oriented to person, place, and time.     Cranial Nerves: Cranial nerve deficit present. No dysarthria.     Sensory: Sensation is intact.     Coordination: Finger-Nose-Finger Test normal. Impaired rapid alternating movements.     Gait: Gait abnormal.     Deep Tendon Reflexes:     Reflex Scores:      Tricep reflexes are 3+ on the right side and 1+ on the left side.      Bicep reflexes are 3+ on the right side and 1+ on the left side.      Brachioradialis reflexes are 3+ on the right side and 1+ on the left side.      Patellar reflexes are 3+ on the right side and 1+ on the left side.      Achilles reflexes are 3+ on the right side and 1+ on the left side.    Comments: L CN 6 weakness unable to do Left lateral gaze  Motor 4/5 Right delt , bi, tri, grip, HF, KE, 2- Right ankle DF/PF and inversion  Gait , RIght knee hyperextension, using AFO and Walker  Psychiatric:        Mood and Affect: Mood normal.        Behavior: Behavior normal.  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  Discussed timeframe of recovery , discussed practicing Left lateral gaze as well as stretching R heel cord Cont OP PT, OT F/u PCP F/u neuro F/u optho No driving  PMR f/u  6wks

## 2020-08-20 NOTE — Patient Instructions (Signed)
No Driving °

## 2020-08-20 NOTE — Therapy (Signed)
[place], [place], [address] Phone: [phone]   Fax:  [phone]  Occupational Therapy Evaluation  Patient Details  Name: Mitchell Lambert MRN: [id number] Date of Birth: [birth year] Referring Provider (OT): Dr. Leonie Man   Encounter Date: 08/20/2020   OT End of Session - 08/20/20 1444     Visit Number 1    Number of Visits 24    Date for OT Re-Evaluation 11/12/20    Authorization Type Medicare    Authorization - Visit Number 1    Authorization - Number of Visits 10    Progress Note Due on Visit 10    OT Start Time 1055    OT Stop Time 1135    OT Time Calculation (min) 40 min    Behavior During Therapy WFL for tasks assessed/performed             Past Medical History:  Diagnosis Date   Angina    NO ANGINA SINCE CABG   Arthritis    right ankle   Chronic daily headache    "@ least every other day""improved since heart surgery"   Coronary artery disease    PT [place] CABG SURGERY - DR. TILLEY IS HIS CARDIOLOGIST   Diabetes mellitus    Lantus x 10 yrs   Gout    History of kidney stones 09-08-12   multiple times with some lithotripsies   Hyperlipemia    Hypertension     Past Surgical History:  Procedure Laterality Date   BACK SURGERY     microsurgery: spinal stenosis -lumbar   bone spur  1990's   right great toe; "probably related to gout"   BUBBLE STUDY  06/17/2020   Procedure: BUBBLE STUDY;  Surgeon: Lelon Perla, MD;  Location: [place];  Service: Cardiovascular;;   CARDIAC CATHETERIZATION  04/27/11   CATARACT EXTRACTION W/ INTRAOCULAR LENS IMPLANT  1990's   right; "lens was replaced twice over 3 months; lens is actually over iris"   CORONARY ARTERY BYPASS GRAFT  04/30/2011   Procedure: CORONARY ARTERY BYPASS GRAFTING (CABG);  Surgeon: Melrose Nakayama, MD;  Location: [place];  Service: Open Heart Surgery;  Laterality: N/A;,x4 vessels    CYSTOSCOPY W/ URETERAL STENT PLACEMENT Right 08/19/2012   Procedure: CYSTOSCOPY WITH RETROGRADE PYELOGRAM/URETERAL STENT PLACEMENT;  Surgeon: Molli Hazard, MD;  [facility];  Service: Urology;  Laterality: Right;   CYSTOSCOPY WITH RETROGRADE PYELOGRAM, URETEROSCOPY AND STENT PLACEMENT Left 07/17/2013   Procedure: CYSTOSCOPY, LEFT URETEROSCOPY, BASKET EXTRACTION OF STONE, INSERTION OF LEFT URETERAL STENT, ;  Surgeon: Jorja Loa, MD;  [facility];  Service: Urology;  Laterality: Left;   CYSTOSCOPY/RETROGRADE/URETEROSCOPY Right 09/12/2012   Procedure: CYSTOSCOPY/RETROGRADE/URETEROSCOPY;  Surgeon: Franchot Gallo, MD;  [facility];  Service: Urology;  Laterality: Right;   HOLMIUM LASER APPLICATION Right 8/75/6433   Procedure: HOLMIUM LASER APPLICATION;  Surgeon: Franchot Gallo, MD;  [facility];  Service: Urology;  Laterality: Right;   HOLMIUM LASER APPLICATION Left 2/95/1884   Procedure: HOLMIUM LASER APPLICATION;  Surgeon: Jorja Loa, MD;  [facility];  Service: Urology;  Laterality: Left;   KIDNEY STONE SURGERY     "probably 3 times"   LEFT HEART CATHETERIZATION WITH CORONARY ANGIOGRAM N/A 04/28/2011   Procedure: LEFT HEART CATHETERIZATION WITH CORONARY ANGIOGRAM;  Surgeon: Jacolyn Reedy, MD;  Location[facility] CATH LAB;  Service: Cardiovascular;  Laterality: N/A;   LITHOTRIPSY     "many; probably  5 times"   RADIAL ARTERY HARVEST  04/30/2011   Procedure: RADIAL ARTERY HARVEST;  Surgeon: Melrose Nakayama, MD;  Location: [place];  Service: Open Heart Surgery;  Laterality: Left;   RETINAL DETACHMENT SURGERY  1990's   right eye; "made me have an early cataract"   TEE WITHOUT CARDIOVERSION N/A 06/17/2020   Procedure: TRANSESOPHAGEAL ECHOCARDIOGRAM (TEE);  Surgeon: Lelon Perla, MD;  Location: [facility] ENDOSCOPY;  Service: Cardiovascular;  Laterality: N/A;   VASECTOMY      There were no vitals filed for this visit.   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 MRI showed acute L pontine CVA and prior L parietal infract.  PMH: CAD s/p CABG, HTN, asthma, type 2 diabetes, CKD stage III, HLD, remote R retinal detachment, HTN, CVA.    Patient Stated Goals regain use of RUE    Currently in Pain? No/denies               [facility] OT Assessment - 08/20/20 1057       Assessment   Medical Diagnosis I63.9 (ICD-10-CM) - Left pontine stroke [facility])    Referring Provider (OT) Dr. Leonie Man    Onset Date/Surgical Date 07/21/20   hospitalized this date   Prior Therapy [facility]      Precautions   Precautions Fall    Precaution Comments visual deficits      Balance Screen   Has the patient fallen in the past 6 months No    Has the patient had a decrease in activity level because of a fear of falling?  No    Is the patient reluctant to leave their home because of a fear of falling?  No      Home  Environment   Family/patient expects to be discharged to: Private residence    Lives With Spouse;Other (Comment)   currently staying with a friend on 1 level home     Prior Function   Vocation Full time employment    Surveyor, mining    Leisure Drummer      ADL   Eating/Feeding Needs assist with cutting food    Grooming Modified independent   using LUE to assist, unable to shave with traditional razor with RUE   Upper Body Bathing Supervision/safety    Lower Body Bathing Supervision/safety   min A for drying, has shower seat   Upper Body Dressing Set up    [place] up;Supervision/safety    Toilet Transfer Modified independent   with walker   Tub/Shower Transfer Minimal assistance      IADL   Shopping Needs to be accompanied on any shopping trip    Light Housekeeping Does not participate in any housekeeping tasks    Meal Prep  Needs to have meals prepared and served    Medication Management Takes responsibility if medication is prepared in advance in seperate dosage    Financial Management --   wife handled previously     Mobility   Mobility Status Needs assist   supervision-minA     Written Expression   Dominant Hand Right    Handwriting 100% legible;Increased time   increased difficulty, difficulty applying pressure for writing     Vision - History   Visual History Other (comment)  Patient Visual Report --   lens replacement on right, contact L eye     Vision Assessment   Vision Assessment Vision impaired  _ to be further tested in functional context    Ocular Range of Motion Restricted on left    Alignment/Gaze Preference --   left eye turned inward   Tracking/Visual Pursuits Decreased smoothness of horizontal tracking    Convergence Impaired - to be further tested in functional context    Diplopia Assessment Present all the time/all directions   objects splint side to side   Patient has diffculty with activities due to visual impairment Reading bills      Cognition   Overall Cognitive Status Within Functional Limits for tasks assessed      Sensation   Light Touch Appears Intact      Coordination   Gross Motor Movements are Fluid and Coordinated No    Fine Motor Movements are Fluid and Coordinated No    9 Hole Peg Test Right;Left    Right 9 Hole Peg Test 59.56    Left 9 Hole Peg Test 34.56    Coordination impaired for RUE      ROM / Strength   AROM / PROM / Strength AROM      AROM   Overall AROM  Deficits    Overall AROM Comments RUE shoulder flexion 105, shoulder abduction 110, LUE A/ROM grossly WFLS, scapular winging present on R side with trigger point at border of scapula      Hand Function   Right Hand Grip (lbs) 36.1    Left Hand Grip (lbs) 92.1                               OT Short Term Goals - 08/20/20 1432       OT SHORT TERM GOAL #1   Title I with  HEP for vision, coordination    Time 4    Period Weeks    Status New    Target Date 09/17/20      OT SHORT TERM GOAL #2   Title Pt will demonstrate improved RUE fine motor coordination for ADLS as evidenced by decreasing 9 hole peg test score to 52 secs or less    Time 4    Period Weeks    Status New      OT SHORT TERM GOAL #3   Title Pt will report that he is cutting food independently and  feeding himself with RUE at least 75% of the time.    Time 4    Period Weeks    Status New      OT SHORT TERM GOAL #4   Title Pt will perfrom all basic ADLS with distant supervision.    Time 4    Period Weeks    Status New      OT SHORT TERM GOAL #5   Title Pt will demonstrate ability to retrieve a lightweight object at 110* shoulder flexion with good control and min compensation.    Time 4    Period Weeks    Status New      Additional Short Term Goals   Additional Short Term Goals Yes      OT SHORT TERM GOAL #6   Title Pt will increase RUE grip strength to 45 lbs or greater for increased functional use.    Time 4    Period Weeks    Status New  OT SHORT TERM GOAL #7   Title Pt will verbalzie understanding of compensatroy strategies for visual deficits    Time 4    Period Weeks    Status New               OT Long Term Goals - 08/20/20 1436       OT LONG TERM GOAL #1   Title I with all basic ADLS.    Time 12    Period Weeks    Status New    Target Date 11/12/20      OT LONG TERM GOAL #2   Title Pt will demonstrate improved fine motor coordiantion for ADLs as evidenced by performing 9 hole peg test in 45 secs or less.    Time 12    Period Weeks    Status New      OT LONG TERM GOAL #3   Title Pt will perform simple snack/ beverage prep modified Independently.    Time 12    Period Weeks    Status New      OT LONG TERM GOAL #4   Title Pt will retrieve a 3 lbs object with RUE at 115 shoulder flexion with good control.    Time 12    Period Weeks    Status  New      OT LONG TERM GOAL #5   Title Pt will perfrom simulated work activities modified Independently    Time 12    Period Weeks    Status New      Long Term Additional Goals   Additional Long Term Goals Yes      OT LONG TERM GOAL #6   Title Pt will perfrom tabletop and environmental scanning with 90% or better accuracy in prep for work activities.    Time 12    Period Weeks    Status New                  Plan - 08/20/20 1428     Clinical Impression Statement 70 y/o male presented to  ED for concern of dizziness and diplopia on 07/21/20. MRI showed acute L pontine CVA and prior L parietal infract. Pt received therapies at [facility]. He now presents to occupational therapy with the following deficits: decreased strength, decreased coordination, decreased balance, visual deficits which impede performance of ADLs/ IADLs. Pt can benefit from skilled occupational therapy to address these deficits in order to maximize pt's safety and I with daily activities. PMH: CAD s/p CABG, HTN, asthma, type 2 diabetes, CKD stage III, HLD, remote R retinal detachment, HTN, CVA.    OT Occupational Profile and History Detailed Assessment- Review of Records and additional review of physical, cognitive, psychosocial history related to current functional performance    Occupational performance deficits (Please refer to evaluation for details): ADL's;IADL's;Play;Work;Leisure;Social Participation    Body Structure / Function / Physical Skills ADL;Balance;Endurance;Mobility;Strength;Flexibility;UE functional use;FMC;Coordination;Gait;Vision;ROM;GMC;Decreased knowledge of precautions;Decreased knowledge of use of DME;Dexterity;IADL    Rehab Potential Good    Clinical Decision Making Limited treatment options, no task modification necessary    Comorbidities Affecting Occupational Performance: May have comorbidities impacting occupational performance    Modification or Assistance to Complete Evaluation  No  modification of tasks or assist necessary to complete eval    OT Frequency 2x / week    OT Duration 12 weeks    OT Treatment/Interventions Self-care/ADL training;Therapeutic exercise;Functional Mobility Training;Balance training;Ultrasound;Neuromuscular education;Manual Therapy;Therapeutic activities;Paraffin;Cryotherapy;DME and/or AE instruction;Cognitive remediation/compensation;Visual/perceptual remediation/compensation;Fluidtherapy;Moist Heat;Electrical Stimulation;Passive range of motion;Patient/family  education;Energy conservation;Aquatic Therapy;Gait Training    Plan initiate HEP for RUE, consider supine cane, and coordination    Consulted and Agree with Plan of Care Patient;Family member/caregiver             Patient will benefit from skilled therapeutic intervention in order to improve the following deficits and impairments:   Body Structure / Function / Physical Skills: ADL, Balance, Endurance, Mobility, Strength, Flexibility, UE functional use, FMC, Coordination, Gait, Vision, ROM, GMC, Decreased knowledge of precautions, Decreased knowledge of use of DME, Dexterity, IADL       Visit Diagnosis: Muscle weakness (generalized) - Plan: Ot plan of care cert/re-cert  Other lack of coordination - Plan: Ot plan of care cert/re-cert  Visuospatial deficit - Plan: Ot plan of care cert/re-cert  Other abnormalities of gait and mobility - Plan: Ot plan of care cert/re-cert  Unsteadiness on feet - Plan: Ot plan of care cert/re-cert    Problem List Patient Active Problem List   Diagnosis Date Noted   Left pontine stroke ([place]) 07/25/2020   Acute right hemiparesis ([place]) 07/25/2020   Essential hypertension    History of CVA (cerebrovascular accident)    CKD (chronic kidney disease), stage II    Diabetes mellitus type 2 in obese (HCC)    Dyslipidemia    CVA (cerebral vascular accident) ([place]) 07/21/2020   Cryptogenic stroke ([place]) 06/18/2020   Acute-on-chronic kidney injury ([place])  05/28/2020   Stroke ([place]) 05/27/2020   Elevated troponin 01/13/2019   COVID-19 virus infection 01/13/2019   S/P CABG (coronary artery bypass graft)    CAD (coronary artery disease) 04/28/2011   Insulin dependent type 2 diabetes mellitus, controlled ([place])    Hyperlipidemia    Hypertension    Obesity (BMI 30-39.9)    Nephrolithiasis, uric acid     Naelani Lafrance 08/20/2020, 3:49 PM Theone Murdoch, OTR/L [phone] Phone: [phone] 3:49 PM 08/20/20  [place] [address] [place], [place], [address] Phone: [phone]   Fax:  [phone]  Name: Mitchell Lambert MRN: [id number] Date of Birth: 01/04/1951

## 2020-08-22 ENCOUNTER — Other Ambulatory Visit: Payer: Self-pay

## 2020-08-22 ENCOUNTER — Ambulatory Visit: Payer: Medicare Other | Admitting: Physical Therapy

## 2020-08-22 ENCOUNTER — Ambulatory Visit: Payer: PRIVATE HEALTH INSURANCE

## 2020-08-22 ENCOUNTER — Ambulatory Visit: Payer: Medicare Other | Admitting: Occupational Therapy

## 2020-08-22 DIAGNOSIS — R41842 Visuospatial deficit: Secondary | ICD-10-CM

## 2020-08-22 DIAGNOSIS — R2681 Unsteadiness on feet: Secondary | ICD-10-CM | POA: Diagnosis not present

## 2020-08-22 DIAGNOSIS — Z125 Encounter for screening for malignant neoplasm of prostate: Secondary | ICD-10-CM | POA: Diagnosis not present

## 2020-08-22 DIAGNOSIS — I639 Cerebral infarction, unspecified: Secondary | ICD-10-CM

## 2020-08-22 DIAGNOSIS — F418 Other specified anxiety disorders: Secondary | ICD-10-CM | POA: Diagnosis not present

## 2020-08-22 DIAGNOSIS — Z09 Encounter for follow-up examination after completed treatment for conditions other than malignant neoplasm: Secondary | ICD-10-CM | POA: Diagnosis not present

## 2020-08-22 DIAGNOSIS — R2689 Other abnormalities of gait and mobility: Secondary | ICD-10-CM | POA: Diagnosis not present

## 2020-08-22 DIAGNOSIS — M6281 Muscle weakness (generalized): Secondary | ICD-10-CM | POA: Diagnosis not present

## 2020-08-22 DIAGNOSIS — R278 Other lack of coordination: Secondary | ICD-10-CM

## 2020-08-22 DIAGNOSIS — I679 Cerebrovascular disease, unspecified: Secondary | ICD-10-CM | POA: Diagnosis not present

## 2020-08-22 DIAGNOSIS — E1169 Type 2 diabetes mellitus with other specified complication: Secondary | ICD-10-CM | POA: Diagnosis not present

## 2020-08-22 DIAGNOSIS — E7800 Pure hypercholesterolemia, unspecified: Secondary | ICD-10-CM | POA: Diagnosis not present

## 2020-08-22 DIAGNOSIS — I69359 Hemiplegia and hemiparesis following cerebral infarction affecting unspecified side: Secondary | ICD-10-CM | POA: Diagnosis not present

## 2020-08-22 DIAGNOSIS — I10 Essential (primary) hypertension: Secondary | ICD-10-CM | POA: Diagnosis not present

## 2020-08-22 NOTE — Therapy (Signed)
[facility] [address] [place], [place], [address] Phone: [phone]   Fax:  [phone]  Occupational Therapy Treatment  Patient Details  Name: Mitchell Lambert MRN: [id number] Date of Birth: [birth year] Referring Provider (OT): Dr. Leonie Man   Encounter Date: 08/22/2020   OT End of Session - 08/22/20 1154     Visit Number 2    Number of Visits 24    Date for OT Re-Evaluation 11/12/20    Authorization Type Medicare    Authorization - Visit Number 2    Authorization - Number of Visits 10    Progress Note Due on Visit 10    OT Start Time 1148    OT Stop Time 1228    OT Time Calculation (min) 40 min             Past Medical History:  Diagnosis Date   Angina    NO ANGINA SINCE CABG   Arthritis    right ankle   Chronic daily headache    "@ least every other day""improved since heart surgery"   Coronary artery disease    PT [place] CABG SURGERY - DR. TILLEY IS HIS CARDIOLOGIST   Diabetes mellitus    Lantus x 10 yrs   Gout    History of kidney stones 09-08-12   multiple times with some lithotripsies   Hyperlipemia    Hypertension     Past Surgical History:  Procedure Laterality Date   BACK SURGERY     microsurgery: spinal stenosis -lumbar   bone spur  1990's   right great toe; "probably related to gout"   BUBBLE STUDY  06/17/2020   Procedure: BUBBLE STUDY;  Surgeon: Lelon Perla, MD;  Location: [facility];  Service: Cardiovascular;;   CARDIAC CATHETERIZATION  04/27/11   CATARACT EXTRACTION W/ INTRAOCULAR LENS IMPLANT  1990's   right; "lens was replaced twice over 3 months; lens is actually over iris"   CORONARY ARTERY BYPASS GRAFT  04/30/2011   Procedure: CORONARY ARTERY BYPASS GRAFTING (CABG);  Surgeon: Melrose Nakayama, MD;  Location: [facility];  Service: Open Heart Surgery;  Laterality: N/A;,x4 vessels   CYSTOSCOPY W/ URETERAL STENT PLACEMENT Right 08/19/2012   Procedure:  CYSTOSCOPY WITH RETROGRADE PYELOGRAM/URETERAL STENT PLACEMENT;  Surgeon: Molli Hazard, MD;  [facility];  Service: Urology;  Laterality: Right;   CYSTOSCOPY WITH RETROGRADE PYELOGRAM, URETEROSCOPY AND STENT PLACEMENT Left 07/17/2013   Procedure: CYSTOSCOPY, LEFT URETEROSCOPY, BASKET EXTRACTION OF STONE, INSERTION OF LEFT URETERAL STENT, ;  Surgeon: Jorja Loa, MD;  [facility];  Service: Urology;  Laterality: Left;   CYSTOSCOPY/RETROGRADE/URETEROSCOPY Right 09/12/2012   Procedure: CYSTOSCOPY/RETROGRADE/URETEROSCOPY;  Surgeon: Franchot Gallo, MD;  [facility];  Service: Urology;  Laterality: Right;   HOLMIUM LASER APPLICATION Right 03/20/4172   Procedure: HOLMIUM LASER APPLICATION;  Surgeon: Franchot Gallo, MD;  [facility];  Service: Urology;  Laterality: Right;   HOLMIUM LASER APPLICATION Left 0/81/4481   Procedure: HOLMIUM LASER APPLICATION;  Surgeon: Jorja Loa, MD;  [facility];  Service: Urology;  Laterality: Left;   KIDNEY STONE SURGERY     "probably 3 times"   LEFT HEART CATHETERIZATION WITH CORONARY ANGIOGRAM N/A 04/28/2011   Procedure: LEFT HEART CATHETERIZATION WITH CORONARY ANGIOGRAM;  Surgeon: Jacolyn Reedy, MD;  Location: [facility] CATH LAB;  Service: Cardiovascular;  Laterality: N/A;   LITHOTRIPSY     "many; probably 5 times"   RADIAL ARTERY HARVEST  04/30/2011  Procedure: RADIAL ARTERY HARVEST;  Surgeon: Melrose Nakayama, MD;  Location: [facility];  Service: Open Heart Surgery;  Laterality: Left;   RETINAL DETACHMENT SURGERY  1990's   right eye; "made me have an early cataract"   TEE WITHOUT CARDIOVERSION N/A 06/17/2020   Procedure: TRANSESOPHAGEAL ECHOCARDIOGRAM (TEE);  Surgeon: Lelon Perla, MD;  [facility] ENDOSCOPY;  Service: Cardiovascular;  Laterality: N/A;   VASECTOMY      There were no vitals filed for this visit.   Subjective Assessment - 08/22/20 1154     Pertinent History 70 y/o male presented to  ED for  concern of dizziness and diplopia on 07/21/20. MRI showed acute L pontine CVA and prior L parietal infract.  PMH: CAD s/p CABG, HTN, asthma, type 2 diabetes, CKD stage III, HLD, remote R retinal detachment, HTN, CVA.    Patient Stated Goals regain use of RUE    Currently in Pain? No/denies                          Treatment: Handwriting strategies with foam grip, min v.c       OT Education - 08/22/20 1253     Education Details visual HEP,coordination HEP, ways to avoid compensatory shoulder hike    Person(s) Educated Patient    Methods Explanation;Demonstration;Verbal cues;Handout    Comprehension Verbalized understanding;Returned demonstration;Verbal cues required              OT Short Term Goals - 08/20/20 1432       OT SHORT TERM GOAL #1   Title I with HEP for vision, coordination    Time 4    Period Weeks    Status New    Target Date 09/17/20      OT SHORT TERM GOAL #2   Title Pt will demonstrate improved RUE fine motor coordination for ADLS as evidenced by decreasing 9 hole peg test score to 52 secs or less    Time 4    Period Weeks    Status New      OT SHORT TERM GOAL #3   Title Pt will report that he is cutting food independently and  feeding himself with RUE at least 75% of the time.    Time 4    Period Weeks    Status New      OT SHORT TERM GOAL #4   Title Pt will perfrom all basic ADLS with distant supervision.    Time 4    Period Weeks    Status New      OT SHORT TERM GOAL #5   Title Pt will demonstrate ability to retrieve a lightweight object at 110* shoulder flexion with good control and min compensation.    Time 4    Period Weeks    Status New      Additional Short Term Goals   Additional Short Term Goals Yes      OT SHORT TERM GOAL #6   Title Pt will increase RUE grip strength to 45 lbs or greater for increased functional use.    Time 4    Period Weeks    Status New      OT SHORT TERM GOAL #7   Title Pt will  verbalzie understanding of compensatroy strategies for visual deficits    Time 4    Period Weeks    Status New               OT  Long Term Goals - 08/20/20 1436       OT LONG TERM GOAL #1   Title I with all basic ADLS.    Time 12    Period Weeks    Status New    Target Date 11/12/20      OT LONG TERM GOAL #2   Title Pt will demonstrate improved fine motor coordiantion for ADLs as evidenced by performing 9 hole peg test in 45 secs or less.    Time 12    Period Weeks    Status New      OT LONG TERM GOAL #3   Title Pt will perform simple snack/ beverage prep modified Independently.    Time 12    Period Weeks    Status New      OT LONG TERM GOAL #4   Title Pt will retrieve a 3 lbs object with RUE at 115 shoulder flexion with good control.    Time 12    Period Weeks    Status New      OT LONG TERM GOAL #5   Title Pt will perfrom simulated work activities modified Independently    Time 12    Period Weeks    Status New      Long Term Additional Goals   Additional Long Term Goals Yes      OT LONG TERM GOAL #6   Title Pt will perfrom tabletop and environmental scanning with 90% or better accuracy in prep for work activities.    Time 12    Period Weeks    Status New      OT LONG TERM GOAL #7   Title --    Time --    Period --    Status --                   Plan - 08/22/20 1248     Clinical Impression Statement Pt is progressing towards goals. Pt/ and wife verbalize understanding of beginning visual and coordination HEP.    OT Occupational Profile and History Detailed Assessment- Review of Records and additional review of physical, cognitive, psychosocial history related to current functional performance    Occupational performance deficits (Please refer to evaluation for details): ADL's;IADL's;Play;Work;Leisure;Social Participation    Body Structure / Function / Physical Skills ADL;Balance;Endurance;Mobility;Strength;Flexibility;UE functional  use;FMC;Coordination;Gait;Vision;ROM;GMC;Decreased knowledge of precautions;Decreased knowledge of use of DME;Dexterity;IADL    Rehab Potential Good    Clinical Decision Making Limited treatment options, no task modification necessary    Comorbidities Affecting Occupational Performance: May have comorbidities impacting occupational performance    Modification or Assistance to Complete Evaluation  No modification of tasks or assist necessary to complete eval    OT Frequency 2x / week    OT Duration 12 weeks    OT Treatment/Interventions Self-care/ADL training;Therapeutic exercise;Functional Mobility Training;Balance training;Ultrasound;Neuromuscular education;Manual Therapy;Therapeutic activities;Paraffin;Cryotherapy;DME and/or AE instruction;Cognitive remediation/compensation;Visual/perceptual remediation/compensation;Fluidtherapy;Moist Heat;Electrical Stimulation;Passive range of motion;Patient/family education;Energy conservation;Aquatic Therapy;Gait Training    Plan , consider supine cane, NMR for normal movement avoid compensatory shoulder hike    Consulted and Agree with Plan of Care Patient;Family member/caregiver             Patient will benefit from skilled therapeutic intervention in order to improve the following deficits and impairments:   Body Structure / Function / Physical Skills: ADL, Balance, Endurance, Mobility, Strength, Flexibility, UE functional use, FMC, Coordination, Gait, Vision, ROM, GMC, Decreased knowledge of precautions, Decreased knowledge of use of DME, Dexterity, IADL  Visit Diagnosis: Muscle weakness (generalized)  Other lack of coordination  Visuospatial deficit  Other abnormalities of gait and mobility  Unsteadiness on feet    Problem List Patient Active Problem List   Diagnosis Date Noted   Left pontine stroke ([place]) 07/25/2020   Acute right hemiparesis ([place]) 07/25/2020   Essential hypertension    History of CVA (cerebrovascular  accident)    CKD (chronic kidney disease), stage II    Diabetes mellitus type 2 in obese (HCC)    Dyslipidemia    CVA (cerebral vascular accident) ([place]) 07/21/2020   Cryptogenic stroke ([place]) 06/18/2020   Acute-on-chronic kidney injury ([place]) 05/28/2020   Stroke ([place]) 05/27/2020   Elevated troponin 01/13/2019   COVID-19 virus infection 01/13/2019   S/P CABG (coronary artery bypass graft)    CAD (coronary artery disease) 04/28/2011   Insulin dependent type 2 diabetes mellitus, controlled ([place])    Hyperlipidemia    Hypertension    Obesity (BMI 30-39.9)    Nephrolithiasis, uric acid     Noam Karaffa 08/22/2020, 12:55 PM Theone Murdoch, OTR/L [phone] Phone: [phone] 12:55 PM 08/22/20  [facility] [address] [place], [place], [address] Phone: [phone]   Fax:  [phone]  Name: Mitchell Lambert MRN: [id number] Date of Birth: [birth year]

## 2020-08-22 NOTE — Patient Instructions (Addendum)
   Coordination Activities  Perform the following activities for 20 minutes 1 times per day with right hand(s).  Toss ball between hands Rotate ball in right hand Flip cards 1 at a time as fast as you can. Pick up coins and place in container or coin bank. Pick up coins and stack. Practice writing and/or typing.                     Diplopia HEP:  Perform at least 1-2 times per day. Stop if your eye becomes fatigued or hurts and try again later.  1. Hold a small object/card in front of you.  Hold it in the middle at arm's length away.    2. Cover your right eye and look at the object with your left eye   3. Slowly move the object side to side in front of you while continuing to watch it with your  eye.  4.  Remember to keep your head still and only move your eye.  5.  Repeat 5-10 times.  6.  Then, move object up and down while watching it 5-10 times.

## 2020-08-22 NOTE — Therapy (Signed)
[facility] [address] [place], [place], [address] Phone: [phone]   Fax:  [phone]  Physical Therapy Treatment  Patient Details  Name: Mitchell Lambert MRN: [id number] Date of Birth: 1950-08-21 Referring Provider (PT): Garvin Fila, MD   Encounter Date: 08/22/2020   PT End of Session - 08/22/20 1414     Visit Number 2    Number of Visits 17    Date for PT Re-Evaluation 10/15/20    Authorization Type Medicare - will need 10th visit PN    Progress Note Due on Visit 10    PT Start Time 1315    PT Stop Time 1400    PT Time Calculation (min) 45 min    Equipment Utilized During Treatment Gait belt    Activity Tolerance Patient tolerated treatment well    Behavior During Therapy WFL for tasks assessed/performed             Past Medical History:  Diagnosis Date   Angina    NO ANGINA SINCE CABG   Arthritis    right ankle   Chronic daily headache    "@ least every other day""improved since heart surgery"   Coronary artery disease    PT [facility] CABG SURGERY - DR. TILLEY IS HIS CARDIOLOGIST   Diabetes mellitus    Lantus x 10 yrs   Gout    History of kidney stones 09-08-12   multiple times with some lithotripsies   Hyperlipemia    Hypertension     Past Surgical History:  Procedure Laterality Date   BACK SURGERY     microsurgery: spinal stenosis -lumbar   bone spur  1990's   right great toe; "probably related to gout"   BUBBLE STUDY  06/17/2020   Procedure: BUBBLE STUDY;  Surgeon: Lelon Perla, MD;  Location: [facility];  Service: Cardiovascular;;   CARDIAC CATHETERIZATION  04/27/11   CATARACT EXTRACTION W/ INTRAOCULAR LENS IMPLANT  1990's   right; "lens was replaced twice over 3 months; lens is actually over iris"   CORONARY ARTERY BYPASS GRAFT  04/30/2011   Procedure: CORONARY ARTERY BYPASS GRAFTING (CABG);  Surgeon: Melrose Nakayama, MD;  Location: [facility];   Service: Open Heart Surgery;  Laterality: N/A;,x4 vessels   CYSTOSCOPY W/ URETERAL STENT PLACEMENT Right 08/19/2012   Procedure: CYSTOSCOPY WITH RETROGRADE PYELOGRAM/URETERAL STENT PLACEMENT;  Surgeon: Molli Hazard, MD;  [facility];  Service: Urology;  Laterality: Right;   CYSTOSCOPY WITH RETROGRADE PYELOGRAM, URETEROSCOPY AND STENT PLACEMENT Left 07/17/2013   Procedure: CYSTOSCOPY, LEFT URETEROSCOPY, BASKET EXTRACTION OF STONE, INSERTION OF LEFT URETERAL STENT, ;  Surgeon: Jorja Loa, MD;  [facility];  Service: Urology;  Laterality: Left;   CYSTOSCOPY/RETROGRADE/URETEROSCOPY Right 09/12/2012   Procedure: CYSTOSCOPY/RETROGRADE/URETEROSCOPY;  Surgeon: Franchot Gallo, MD;  [facility];  Service: Urology;  Laterality: Right;   HOLMIUM LASER APPLICATION Right 2/54/2706   Procedure: HOLMIUM LASER APPLICATION;  Surgeon: Franchot Gallo, MD;  [facility];  Service: Urology;  Laterality: Right;   HOLMIUM LASER APPLICATION Left 2/37/6283   Procedure: HOLMIUM LASER APPLICATION;  Surgeon: Jorja Loa, MD;  [facility];  Service: Urology;  Laterality: Left;   KIDNEY STONE SURGERY     "probably 3 times"   LEFT HEART CATHETERIZATION WITH CORONARY ANGIOGRAM N/A 04/28/2011   Procedure: LEFT HEART CATHETERIZATION WITH CORONARY ANGIOGRAM;  Surgeon: Jacolyn Reedy, MD;  Location: [facility] CATH LAB;  Service: Cardiovascular;  Laterality: N/A;  LITHOTRIPSY     "many; probably 5 times"   RADIAL ARTERY HARVEST  04/30/2011   Procedure: RADIAL ARTERY HARVEST;  Surgeon: Melrose Nakayama, MD;  Location: [facility];  Service: Open Heart Surgery;  Laterality: Left;   RETINAL DETACHMENT SURGERY  1990's   right eye; "made me have an early cataract"   TEE WITHOUT CARDIOVERSION N/A 06/17/2020   Procedure: TRANSESOPHAGEAL ECHOCARDIOGRAM (TEE);  Surgeon: Lelon Perla, MD;  [facility]: [facility];  Service: Cardiovascular;  Laterality: N/A;   VASECTOMY      There were no  vitals filed for this visit.   Subjective Assessment - 08/22/20 1317     Subjective OT went well this morning. Pt has more appointments this afternoon. Nothing new or different. Pt will bring in his right foot drum pedal next session as he feels this may be a good way for him to exercise his ankle as well.    Patient is accompained by: Family member    Pertinent History achilles tendon tear on L    Limitations Standing;Sitting;House hold activities    How long can you stand comfortably? No issues    How long can you walk comfortably? In house only    Patient Stated Goals Return to their own home, play drums again    Currently in Pain? No/denies                               [facility] Adult PT Treatment/Exercise - 08/22/20 0001       Ambulation/Gait   Ambulation Distance (Feet) 25 Feet    Assistive device Rolling walker    Gait Pattern Step-through pattern;Decreased stance time - right;Decreased dorsiflexion - right;Decreased weight shift to right;Right foot flat   R "foot slap"   Ambulation Surface Level;Indoor      Exercises   Exercises Knee/Hip      Knee/Hip Exercises: Stretches   Gastroc Stretch Right;30 seconds    Soleus Stretch Right;30 seconds      Knee/Hip Exercises: Seated   Other Seated Knee/Hip Exercises Rocker board for R ankle AAROM with PF/DF 3x10   neurofacilitation via tapping ant tib   Hamstring Curl Strengthening;3 sets;10 reps      Knee/Hip Exercises: Supine   Bridges Strengthening;3 sets;10 reps    Straight Leg Raises Strengthening;3 sets;10 reps    Other Supine Knee/Hip Exercises AAROM ankle DF 2x10      Knee/Hip Exercises: Sidelying   Clams 3x10                 Balance Exercises - 08/22/20 0001       Balance Exercises: Standing   SLS with Vectors Upper extremity assist 2;Solid surface   x10 reps tapping R forefoot on target; x10 reps tapping R heel on target                PT Short Term Goals - 08/20/20 1651        PT SHORT TERM GOAL #1   Title Pt will be independent with initial HEP    Time 4    Period Weeks    Status New    Target Date 09/17/20      PT SHORT TERM GOAL #2   Title Pt will be able to demo at least 3/5 R ankle DF to play on his drums    Time 4    Period Weeks    Status New    Target  Date 09/17/20      PT SHORT TERM GOAL #3   Title Pt will be able to walk >500' with LRAD and SBA for improved home/community mobility    Time 4    Period Weeks    Status New    Target Date 09/17/20      PT SHORT TERM GOAL #4   Title Pt will be able to ascend/descend at least 20 steps with railing and SBA for safe return home    Baseline Performed 4 steps with bilat rails and min A    Time 4    Period Weeks    Status New    Target Date 09/17/20               PT Long Term Goals - 08/20/20 1703       PT LONG TERM GOAL #1   Title Pt will be independent with advanced HEP for strengthening and balance    Time 8    Period Weeks    Status New    Target Date 10/15/20      PT LONG TERM GOAL #2   Title Pt will have improved Berg Balance Score of at least 45/56 to indicate decreased fall risk    Baseline 39/56    Time 8    Period Weeks    Status New    Target Date 10/15/20      PT LONG TERM GOAL #3   Title Pt will have improved DGI score to at least 19/24 to demo decreased fall risk    Baseline 17/24    Time 8    Period Weeks    Status New    Target Date 10/15/20      PT LONG TERM GOAL #4   Title Pt will be able to amb at least 1000' with LRAD mod I on level and unlevel surfaces for community amb    Baseline Needs use of RW    Time 8    Period Weeks    Status New    Target Date 10/15/20      PT LONG TERM GOAL #5   Title Pt will be able to use R foot/ankle for drumming to return to leisure activities    Time 8    Period Weeks    Status New    Target Date 10/15/20                   Plan - 08/22/20 1407     Clinical Impression Statement Treatment focused on  establishing a strengthening HEP for pt. Encouraged pt to stretch ankle as often as possible and to work on AROM and decreasing his tone. Pt able to demo some heel strike ambulating without his AFO; however, demos decreased eccentric control (pt with "foot slap"). Pt demos some mild dysmetria when asked to place R foot to a target. Limited due to fatigue.    Personal Factors and Comorbidities Age;Fitness;Time since onset of injury/illness/exacerbation;Comorbidity 1;Past/Current Experience    Comorbidities L Achilles tendon tear, chronic R LE edema, CAD s/p CABG, HTN, asthma, type 2 diabetes, CKD stage III, HLD, remote R retinal detachment, HTN, CVA.    Examination-Activity Limitations Squat;Stairs;Caring for Others;Locomotion Level    Examination-Participation Restrictions Cleaning;Community Activity;Driving;Occupation;Shop;Yard Work    [employer] Evolving/Moderate complexity    Rehab Potential Good    PT Frequency 2x / week    PT Duration 8 weeks    PT Treatment/Interventions ADLs/Self Care Home Management;Aquatic Therapy;Cryotherapy;Electrical Stimulation;Moist  Heat;DME Instruction;Gait training;Stair training;Functional mobility training;Therapeutic activities;Therapeutic exercise;Balance training;Neuromuscular re-education;Manual techniques;Patient/family education;Passive range of motion;Taping;Joint Manipulations    PT Next Visit Plan Begin with R ankle stretching/AROM/strengthening (pt would like to show PT if his drum pedal would be an appropriate way to help with this at home). Review HEP if needed. Continue to progress R LE strengthening (especially quad to decrease knee hyperextension). Work on [place] coordination/control. Trial walking without AFO as pt does have some ankle movement. Consider working on steps.    PT Home Exercise Plan Access Code: [email or web address]    Consulted and Agree with Plan of Care Patient;Family member/caregiver    Family Member Consulted Wife              Patient will benefit from skilled therapeutic intervention in order to improve the following deficits and impairments:  Abnormal gait, Decreased range of motion, Difficulty walking, Increased fascial restricitons, Impaired tone, Decreased activity tolerance, Decreased balance, Decreased mobility, Decreased strength, Increased edema  Visit Diagnosis: Muscle weakness (generalized)  Other lack of coordination  Visuospatial deficit  Other abnormalities of gait and mobility  Unsteadiness on feet  Left pontine stroke [facility])     Problem List Patient Active Problem List   Diagnosis Date Noted   Left pontine stroke ([place]) 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   Acute right hemiparesis ([place]) 07/25/2020   Essential hypertension    History of CVA (cerebrovascular accident)    CKD (chronic kidney disease), stage II    Diabetes mellitus type 2 in obese (HCC)    Dyslipidemia    CVA (cerebral vascular accident) ([place]) 07/21/2020   Cryptogenic stroke ([place]) 06/18/2020   Acute-on-chronic kidney injury ([place]) 05/28/2020   Stroke ([place]) 05/27/2020   Elevated troponin 01/13/2019   COVID-19 virus infection 01/13/2019   S/P CABG (coronary artery bypass graft)    CAD (coronary artery disease) 04/28/2011   Insulin dependent type 2 diabetes mellitus, controlled ([place])    Hyperlipidemia    Hypertension    Obesity (BMI 30-39.9)    Nephrolithiasis, uric acid     Jobina Maita April Ma L Romney PT, DPT 08/22/2020, 2:16 PM  [facility] [address] [place], [place], [address] Phone: [phone]   Fax:  [phone]  Name: Mitchell Lambert MRN: [id number] Date of Birth: 12-14-1950

## 2020-08-26 ENCOUNTER — Encounter: Payer: Self-pay | Admitting: Physical Therapy

## 2020-08-26 ENCOUNTER — Ambulatory Visit: Payer: Medicare Other | Admitting: Physical Therapy

## 2020-08-26 ENCOUNTER — Other Ambulatory Visit: Payer: Self-pay

## 2020-08-26 ENCOUNTER — Ambulatory Visit (INDEPENDENT_AMBULATORY_CARE_PROVIDER_SITE_OTHER): Payer: Medicare Other

## 2020-08-26 ENCOUNTER — Encounter: Payer: Self-pay | Admitting: Occupational Therapy

## 2020-08-26 ENCOUNTER — Ambulatory Visit: Payer: Medicare Other | Admitting: Occupational Therapy

## 2020-08-26 DIAGNOSIS — R2689 Other abnormalities of gait and mobility: Secondary | ICD-10-CM | POA: Diagnosis not present

## 2020-08-26 DIAGNOSIS — I639 Cerebral infarction, unspecified: Secondary | ICD-10-CM | POA: Diagnosis not present

## 2020-08-26 DIAGNOSIS — R41842 Visuospatial deficit: Secondary | ICD-10-CM | POA: Diagnosis not present

## 2020-08-26 DIAGNOSIS — R2681 Unsteadiness on feet: Secondary | ICD-10-CM | POA: Diagnosis not present

## 2020-08-26 DIAGNOSIS — R278 Other lack of coordination: Secondary | ICD-10-CM | POA: Diagnosis not present

## 2020-08-26 DIAGNOSIS — M6281 Muscle weakness (generalized): Secondary | ICD-10-CM

## 2020-08-26 LAB — CUP PACEART REMOTE DEVICE CHECK
Date Time Interrogation Session: 20220625085942
Implantable Pulse Generator Implant Date: 20220419

## 2020-08-26 NOTE — Therapy (Signed)
[facility] [address] [place], [place], [address] Phone: [phone]   Fax:  [phone]  Occupational Therapy Treatment  Patient Details  Name: Mitchell Lambert MRN: [id number] Date of Birth: 04-09-1950 Referring Provider (OT): Dr. Leonie Man   Encounter Date: 08/26/2020   OT End of Session - 08/26/20 1328     Visit Number 3    Number of Visits 24    Date for OT Re-Evaluation 11/12/20    Authorization Type Medicare    Authorization - Visit Number 3    Authorization - Number of Visits 10    Progress Note Due on Visit 10    OT Start Time 1321    OT Stop Time 1400    OT Time Calculation (min) 39 min    Behavior During Therapy WFL for tasks assessed/performed             Past Medical History:  Diagnosis Date   Angina    NO ANGINA SINCE CABG   Arthritis    right ankle   Chronic daily headache    "@ least every other day""improved since heart surgery"   Coronary artery disease    PT [place] CABG SURGERY - DR. TILLEY IS HIS CARDIOLOGIST   Diabetes mellitus    Lantus x 10 yrs   Gout    History of kidney stones 09-08-12   multiple times with some lithotripsies   Hyperlipemia    Hypertension     Past Surgical History:  Procedure Laterality Date   BACK SURGERY     microsurgery: spinal stenosis -lumbar   bone spur  1990's   right great toe; "probably related to gout"   BUBBLE STUDY  06/17/2020   Procedure: BUBBLE STUDY;  Surgeon: Lelon Perla, MD;  Location: [facility];  Service: Cardiovascular;;   CARDIAC CATHETERIZATION  04/27/11   CATARACT EXTRACTION W/ INTRAOCULAR LENS IMPLANT  1990's   right; "lens was replaced twice over 3 months; lens is actually over iris"   CORONARY ARTERY BYPASS GRAFT  04/30/2011   Procedure: CORONARY ARTERY BYPASS GRAFTING (CABG);  Surgeon: Melrose Nakayama, MD;  Location: [facility];  Service: Open Heart Surgery;  Laterality: N/A;,x4 vessels    CYSTOSCOPY W/ URETERAL STENT PLACEMENT Right 08/19/2012   Procedure: CYSTOSCOPY WITH RETROGRADE PYELOGRAM/URETERAL STENT PLACEMENT;  Surgeon: Molli Hazard, MD;  [facility];  Service: Urology;  Laterality: Right;   CYSTOSCOPY WITH RETROGRADE PYELOGRAM, URETEROSCOPY AND STENT PLACEMENT Left 07/17/2013   Procedure: CYSTOSCOPY, LEFT URETEROSCOPY, BASKET EXTRACTION OF STONE, INSERTION OF LEFT URETERAL STENT, ;  Surgeon: Jorja Loa, MD;  [facility];  Service: Urology;  Laterality: Left;   CYSTOSCOPY/RETROGRADE/URETEROSCOPY Right 09/12/2012   Procedure: CYSTOSCOPY/RETROGRADE/URETEROSCOPY;  Surgeon: Franchot Gallo, MD;  [facility];  Service: Urology;  Laterality: Right;   HOLMIUM LASER APPLICATION Right 10/28/9369   Procedure: HOLMIUM LASER APPLICATION;  Surgeon: Franchot Gallo, MD;  [facility];  Service: Urology;  Laterality: Right;   HOLMIUM LASER APPLICATION Left 6/96/7893   Procedure: HOLMIUM LASER APPLICATION;  Surgeon: Jorja Loa, MD;  [facility];  Service: Urology;  Laterality: Left;   KIDNEY STONE SURGERY     "probably 3 times"   LEFT HEART CATHETERIZATION WITH CORONARY ANGIOGRAM N/A 04/28/2011   Procedure: LEFT HEART CATHETERIZATION WITH CORONARY ANGIOGRAM;  Surgeon: Jacolyn Reedy, MD;  Location: [facility];  Service: Cardiovascular;  Laterality: N/A;   LITHOTRIPSY     "many; probably  5 times"   RADIAL ARTERY HARVEST  04/30/2011   Procedure: RADIAL ARTERY HARVEST;  Surgeon: Melrose Nakayama, MD;  Location: [facility];  Service: Open Heart Surgery;  Laterality: Left;   RETINAL DETACHMENT SURGERY  1990's   right eye; "made me have an early cataract"   TEE WITHOUT CARDIOVERSION N/A 06/17/2020   Procedure: TRANSESOPHAGEAL ECHOCARDIOGRAM (TEE);  Surgeon: Lelon Perla, MD;  Location: [facility] ENDOSCOPY;  Service: Cardiovascular;  Laterality: N/A;   VASECTOMY      There were no vitals filed for this visit.   Subjective Assessment -  08/26/20 1324     Subjective  Denies pain    Pertinent History 70 y/o male presented to  ED for concern of dizziness and diplopia on 07/21/20. MRI showed acute L pontine CVA and prior L parietal infract.  PMH: CAD s/p CABG, HTN, asthma, type 2 diabetes, CKD stage III, HLD, remote R retinal detachment, HTN, CVA.    Patient Stated Goals regain use of RUE    Currently in Pain? No/denies                   Treatment: Functional mid range reaching to copy a small peg design on vertical surface, min-mod difficulty, drops for increased functional reach and fine motor coordination, increased time required. Arm bike x 6 mins level 3 for conditioning              OT Education - 08/26/20 1427     Education Details closed chain shoulder exercises supine / seated with paper towel roll, see pt instructions, 10 reps each, min v..c    Person(s) Educated Patient    Methods Explanation;Demonstration;Verbal cues;Handout    Comprehension Verbalized understanding;Returned demonstration;Verbal cues required              OT Short Term Goals - 08/20/20 1432       OT SHORT TERM GOAL #1   Title I with HEP for vision, coordination    Time 4    Period Weeks    Status New    Target Date 09/17/20      OT SHORT TERM GOAL #2   Title Pt will demonstrate improved RUE fine motor coordination for ADLS as evidenced by decreasing 9 hole peg test score to 52 secs or less    Time 4    Period Weeks    Status New      OT SHORT TERM GOAL #3   Title Pt will report that he is cutting food independently and  feeding himself with RUE at least 75% of the time.    Time 4    Period Weeks    Status New      OT SHORT TERM GOAL #4   Title Pt will perfrom all basic ADLS with distant supervision.    Time 4    Period Weeks    Status New      OT SHORT TERM GOAL #5   Title Pt will demonstrate ability to retrieve a lightweight object at 110* shoulder flexion with good control and min compensation.     Time 4    Period Weeks    Status New      Additional Short Term Goals   Additional Short Term Goals Yes      OT SHORT TERM GOAL #6   Title Pt will increase RUE grip strength to 45 lbs or greater for increased functional use.    Time 4    Period Weeks  Status New      OT SHORT TERM GOAL #7   Title Pt will verbalzie understanding of compensatroy strategies for visual deficits    Time 4    Period Weeks    Status New               OT Long Term Goals - 08/20/20 1436       OT LONG TERM GOAL #1   Title I with all basic ADLS.    Time 12    Period Weeks    Status New    Target Date 11/12/20      OT LONG TERM GOAL #2   Title Pt will demonstrate improved fine motor coordiantion for ADLs as evidenced by performing 9 hole peg test in 45 secs or less.    Time 12    Period Weeks    Status New      OT LONG TERM GOAL #3   Title Pt will perform simple snack/ beverage prep modified Independently.    Time 12    Period Weeks    Status New      OT LONG TERM GOAL #4   Title Pt will retrieve a 3 lbs object with RUE at 115 shoulder flexion with good control.    Time 12    Period Weeks    Status New      OT LONG TERM GOAL #5   Title Pt will perfrom simulated work activities modified Independently    Time 12    Period Weeks    Status New      Long Term Additional Goals   Additional Long Term Goals Yes      OT LONG TERM GOAL #6   Title Pt will perfrom tabletop and environmental scanning with 90% or better accuracy in prep for work activities.    Time 12    Period Weeks    Status New      OT LONG TERM GOAL #7   Title --    Time --    Period --    Status --                   Plan - 08/26/20 1341     Clinical Impression Statement Pt is progressing towards goals. Pt demonstrates understanding of updates to HEP.    OT Occupational Profile and History Detailed Assessment- Review of Records and additional review of physical, cognitive, psychosocial history  related to current functional performance    Occupational performance deficits (Please refer to evaluation for details): ADL's;IADL's;Play;Work;Leisure;Social Participation    Body Structure / Function / Physical Skills ADL;Balance;Endurance;Mobility;Strength;Flexibility;UE functional use;FMC;Coordination;Gait;Vision;ROM;GMC;Decreased knowledge of precautions;Decreased knowledge of use of DME;Dexterity;IADL    Rehab Potential Good    Clinical Decision Making Limited treatment options, no task modification necessary    Comorbidities Affecting Occupational Performance: May have comorbidities impacting occupational performance    Modification or Assistance to Complete Evaluation  No modification of tasks or assist necessary to complete eval    OT Frequency 2x / week    OT Duration 12 weeks    OT Treatment/Interventions Self-care/ADL training;Therapeutic exercise;Functional Mobility Training;Balance training;Ultrasound;Neuromuscular education;Manual Therapy;Therapeutic activities;Paraffin;Cryotherapy;DME and/or AE instruction;Cognitive remediation/compensation;Visual/perceptual remediation/compensation;Fluidtherapy;Moist Heat;Electrical Stimulation;Passive range of motion;Patient/family education;Energy conservation;Aquatic Therapy;Gait Training    Plan , NMR for normal movement avoid compensatory shoulder hike, fine motor coordination, pt is a Librarian, academic with Plan of Care Patient             Patient  will benefit from skilled therapeutic intervention in order to improve the following deficits and impairments:   Body Structure / Function / Physical Skills: ADL, Balance, Endurance, Mobility, Strength, Flexibility, UE functional use, FMC, Coordination, Gait, Vision, ROM, GMC, Decreased knowledge of precautions, Decreased knowledge of use of DME, Dexterity, IADL       Visit Diagnosis: Muscle weakness (generalized)  Other lack of coordination  Visuospatial  deficit    Problem List Patient Active Problem List   Diagnosis Date Noted   Left pontine stroke ([facility]) 07/25/2020   Acute right hemiparesis ([facility]) 07/25/2020   Essential hypertension    History of CVA (cerebrovascular accident)    CKD (chronic kidney disease), stage II    Diabetes mellitus type 2 in obese ([facility])    Dyslipidemia    CVA (cerebral vascular accident) ([facility]) 07/21/2020   Cryptogenic stroke ([facility]) 06/18/2020   Acute-on-chronic kidney injury ([facility]) 05/28/2020   Stroke ([facility]) 05/27/2020   Elevated troponin 01/13/2019   COVID-19 virus infection 01/13/2019   S/P CABG (coronary artery bypass graft)    CAD (coronary artery disease) 04/28/2011   Insulin dependent type 2 diabetes mellitus, controlled ([facility])    Hyperlipidemia    Hypertension    Obesity (BMI 30-39.9)    Nephrolithiasis, uric acid     Mitchell Lambert 08/26/2020, 2:29 PM Theone Murdoch, OTR/L [phone] Phone: [phone] 2:55 PM 08/26/20  [facility] [address] [place], [place], [address] Phone: [phone]   Fax:  [phone]  Name: Mitchell Lambert MRN: [id number] Date of Birth: 12/16/1950

## 2020-08-26 NOTE — Therapy (Addendum)
[facility] [address] [place], [place], [address] Phone: [phone]   Fax:  [phone]  Physical Therapy Treatment  Patient Details  Name: DEAVEN URWIN MRN: [id number] Date of Birth: 1950/10/18 Referring Provider (PT): Garvin Fila, MD   Encounter Date: 08/26/2020   PT End of Session - 08/26/20 1658     Visit Number 3    Number of Visits 17    Date for PT Re-Evaluation 10/15/20    Authorization Type Medicare - will need 10th visit PN    Progress Note Due on Visit 10    PT Start Time 1447    PT Stop Time 1530    PT Time Calculation (min) 43 min    Equipment Utilized During Treatment Gait belt    Activity Tolerance Patient tolerated treatment well    Behavior During Therapy WFL for tasks assessed/performed             Past Medical History:  Diagnosis Date   Angina    NO ANGINA SINCE CABG   Arthritis    right ankle   Chronic daily headache    "@ least every other day""improved since heart surgery"   Coronary artery disease    PT [place] CABG SURGERY - DR. TILLEY IS HIS CARDIOLOGIST   Diabetes mellitus    Lantus x 10 yrs   Gout    History of kidney stones 09-08-12   multiple times with some lithotripsies   Hyperlipemia    Hypertension     Past Surgical History:  Procedure Laterality Date   BACK SURGERY     microsurgery: spinal stenosis -lumbar   bone spur  1990's   right great toe; "probably related to gout"   BUBBLE STUDY  06/17/2020   Procedure: BUBBLE STUDY;  Surgeon: Lelon Perla, MD;  Location: [facility];  Service: Cardiovascular;;   CARDIAC CATHETERIZATION  04/27/11   CATARACT EXTRACTION W/ INTRAOCULAR LENS IMPLANT  1990's   right; "lens was replaced twice over 3 months; lens is actually over iris"   CORONARY ARTERY BYPASS GRAFT  04/30/2011   Procedure: CORONARY ARTERY BYPASS GRAFTING (CABG);  Surgeon: Melrose Nakayama, MD;  Location: [facility];   Service: Open Heart Surgery;  Laterality: N/A;,x4 vessels   CYSTOSCOPY W/ URETERAL STENT PLACEMENT Right 08/19/2012   Procedure: CYSTOSCOPY WITH RETROGRADE PYELOGRAM/URETERAL STENT PLACEMENT;  Surgeon: Molli Hazard, MD;  [facility];  Service: Urology;  Laterality: Right;   CYSTOSCOPY WITH RETROGRADE PYELOGRAM, URETEROSCOPY AND STENT PLACEMENT Left 07/17/2013   Procedure: CYSTOSCOPY, LEFT URETEROSCOPY, BASKET EXTRACTION OF STONE, INSERTION OF LEFT URETERAL STENT, ;  Surgeon: Jorja Loa, MD;  [facility];  Service: Urology;  Laterality: Left;   CYSTOSCOPY/RETROGRADE/URETEROSCOPY Right 09/12/2012   Procedure: CYSTOSCOPY/RETROGRADE/URETEROSCOPY;  Surgeon: Franchot Gallo, MD;  [facility];  Service: Urology;  Laterality: Right;   HOLMIUM LASER APPLICATION Right 3/50/0938   Procedure: HOLMIUM LASER APPLICATION;  Surgeon: Franchot Gallo, MD;  [facility];  Service: Urology;  Laterality: Right;   HOLMIUM LASER APPLICATION Left 1/82/9937   Procedure: HOLMIUM LASER APPLICATION;  Surgeon: Jorja Loa, MD;  [facility];  Service: Urology;  Laterality: Left;   KIDNEY STONE SURGERY     "probably 3 times"   LEFT HEART CATHETERIZATION WITH CORONARY ANGIOGRAM N/A 04/28/2011   Procedure: LEFT HEART CATHETERIZATION WITH CORONARY ANGIOGRAM;  Surgeon: Jacolyn Reedy, MD;  Location: [facility];  Service: Cardiovascular;  Laterality: N/A;  LITHOTRIPSY     "many; probably 5 times"   RADIAL ARTERY HARVEST  04/30/2011   Procedure: RADIAL ARTERY HARVEST;  Surgeon: Melrose Nakayama, MD;  Location: [facility];  Service: Open Heart Surgery;  Laterality: Left;   RETINAL DETACHMENT SURGERY  1990's   right eye; "made me have an early cataract"   TEE WITHOUT CARDIOVERSION N/A 06/17/2020   Procedure: TRANSESOPHAGEAL ECHOCARDIOGRAM (TEE);  Surgeon: Lelon Perla, MD;  Location: [facility] ENDOSCOPY;  Service: Cardiovascular;  Laterality: N/A;   VASECTOMY      There were no  vitals filed for this visit.   Subjective Assessment - 08/26/20 1455     Subjective Thinks he overdid it with one exercise from home health - was doing a little squat and then pushing up onto the toes. His right ankle has been swollen afterwards and has some hx of sports injuries.    Patient is accompained by: Family member    Pertinent History achilles tendon tear on L    Limitations Standing;Sitting;House hold activities    How long can you stand comfortably? No issues    How long can you walk comfortably? In house only    Patient Stated Goals Return to their own home, play drums again    Currently in Pain? No/denies                          08/26/20 0001  Therapeutic Activites   Therapeutic Activities Other Therapeutic Activities  Other Therapeutic Activities provided pt with tennis balls for his RW for easier propulsion. pt reports doing exercising from home health that seemed to make his R ankle more swollen (mini squat and then up into PF), pt does not remember how many reps he did. discussed to hold off on this exercise for now and to just perform what was given in his HEP for outpatient, and to make sure he is elevating his leg to help with swelling and gently moving ankle          Access Code: [email or web address] URL: [email or web address] Date: 08/26/2020 Prepared by: Janann August   Reviewed bolded exercises from HEP below.   Exercises Clamshell - 1 x daily - 7 x weekly - 3 sets - 10 reps - initial tactile/verbal cues for proper technique  Supine Bridge - 1 x daily - 7 x weekly - 3 sets - 10 reps Supine Active Straight Leg Raise - 1 x daily - 7 x weekly - 3 sets - 10 reps - initial cues for proper technique  Seated Knee Flexion with Anchored Resistance - 1 x daily - 7 x weekly - 3 sets - 10 reps Seated Knee Extension with Resistance - 1 x daily - 7 x weekly - 3 sets - 10 reps Standing Soleus Stretch on Step - 4 x daily - 7 x weekly - 30 sec  hold Standing Gastroc Stretch on Step - 4 x daily - 7 x weekly - 30 sec hold   Balance Exercises - 08/26/20 1519       Balance Exercises: Standing   SLS with Vectors Upper extremity assist 2;Solid surface;Limitations    SLS with Vectors Limitations standing in // bars - shifting weight to RLE and then gently tapping LLE to 4" step, beginning with UE support and then fingertip, then standing on LLE - tapping RLE to forward cone and then cross body cone x12 reps, pt needing to close his L eye during activity.  Step Ups Forward;4 inch;UE support 1;Limitations    Step Ups Limitations X10 reps leading with RLE   Other Standing Exercises staggered stance weight shifting with RLE posteriorly x10 reps with BUE support, initial cues for proper technique                 PT Short Term Goals - 08/20/20 1651       PT SHORT TERM GOAL #1   Title Pt will be independent with initial HEP    Time 4    Period Weeks    Status New    Target Date 09/17/20      PT SHORT TERM GOAL #2   Title Pt will be able to demo at least 3/5 R ankle DF to play on his drums    Time 4    Period Weeks    Status New    Target Date 09/17/20      PT SHORT TERM GOAL #3   Title Pt will be able to walk >500' with LRAD and SBA for improved home/community mobility    Time 4    Period Weeks    Status New    Target Date 09/17/20      PT SHORT TERM GOAL #4   Title Pt will be able to ascend/descend at least 20 steps with railing and SBA for safe return home    Baseline Performed 4 steps with bilat rails and min A    Time 4    Period Weeks    Status New    Target Date 09/17/20               PT Long Term Goals - 08/20/20 1703       PT LONG TERM GOAL #1   Title Pt will be independent with advanced HEP for strengthening and balance    Time 8    Period Weeks    Status New    Target Date 10/15/20      PT LONG TERM GOAL #2   Title Pt will have improved Berg Balance Score of at least 45/56 to indicate  decreased fall risk    Baseline 39/56    Time 8    Period Weeks    Status New    Target Date 10/15/20      PT LONG TERM GOAL #3   Title Pt will have improved DGI score to at least 19/24 to demo decreased fall risk    Baseline 17/24    Time 8    Period Weeks    Status New    Target Date 10/15/20      PT LONG TERM GOAL #4   Title Pt will be able to amb at least 1000' with LRAD mod I on level and unlevel surfaces for community amb    Baseline Needs use of RW    Time 8    Period Weeks    Status New    Target Date 10/15/20      PT LONG TERM GOAL #5   Title Pt will be able to use R foot/ankle for drumming to return to leisure activities    Time 8    Period Weeks    Status New    Target Date 10/15/20                   Plan - 08/26/20 2101     Clinical Impression Statement Reviewed supine strengthening exercises given from last session as HEP with pt needing  initial cues for proper technique. Remainder of session focused on RLE strengthening and standing balance with weight bearing through RLE, pt tolerated session well - needing UE support in // bars for balance activities. Will continue to progress towrads LTGs.    Personal Factors and Comorbidities Age;Fitness;Time since onset of injury/illness/exacerbation;Comorbidity 1;Past/Current Experience    Comorbidities L Achilles tendon tear, chronic R LE edema, CAD s/p CABG, HTN, asthma, type 2 diabetes, CKD stage III, HLD, remote R retinal detachment, HTN, CVA.    Examination-Activity Limitations Squat;Stairs;Caring for Others;Locomotion Level    Examination-Participation Restrictions Cleaning;Community Activity;Driving;Occupation;Shop;Yard Work    Merchant navy officer Evolving/Moderate complexity    Rehab Potential Good    PT Frequency 2x / week    PT Duration 8 weeks    PT Treatment/Interventions ADLs/Self Care Home Management;Aquatic Therapy;Cryotherapy;Electrical Stimulation;Moist Heat;DME Instruction;Gait  training;Stair training;Functional mobility training;Therapeutic activities;Therapeutic exercise;Balance training;Neuromuscular re-education;Manual techniques;Patient/family education;Passive range of motion;Taping;Joint Manipulations    PT Next Visit Plan review remainder of HEP. R ankle stretching/ROM. Continue to progress R LE strengthening. Work on R LE coordination/control. might want to try a heel wedge in R shoe to help control recurvatum ?    PT Home Exercise Plan Access Code: [email or web address]    Consulted and Agree with Plan of Care Patient;Family member/caregiver    Family Member Consulted Wife             Patient will benefit from skilled therapeutic intervention in order to improve the following deficits and impairments:  Abnormal gait, Decreased range of motion, Difficulty walking, Increased fascial restricitons, Impaired tone, Decreased activity tolerance, Decreased balance, Decreased mobility, Decreased strength, Increased edema  Visit Diagnosis: Muscle weakness (generalized)  Unsteadiness on feet  Other abnormalities of gait and mobility     Problem List Patient Active Problem List   Diagnosis Date Noted   Left pontine stroke ([facility]) 07/25/2020   Acute right hemiparesis ([facility]) 07/25/2020   Essential hypertension    History of CVA (cerebrovascular accident)    CKD (chronic kidney disease), stage II    Diabetes mellitus type 2 in obese ([facility])    Dyslipidemia    CVA (cerebral vascular accident) ([facility]) 07/21/2020   Cryptogenic stroke ([facility]) 06/18/2020   Acute-on-chronic kidney injury ([facility]) 05/28/2020   Stroke ([facility]) 05/27/2020   Elevated troponin 01/13/2019   COVID-19 virus infection 01/13/2019   S/P CABG (coronary artery bypass graft)    CAD (coronary artery disease) 04/28/2011   Insulin dependent type 2 diabetes mellitus, controlled ([facility])    Hyperlipidemia    Hypertension    Obesity (BMI 30-39.9)    Nephrolithiasis, uric acid     Arliss Journey, PT, DPT  08/26/2020,  9:03 PM  [facility] [address] [place], [place], [address] Phone: [phone]   Fax:  [phone]  Name: HALEY ROZA MRN: [id number] Date of Birth: [birth year]

## 2020-08-26 NOTE — Patient Instructions (Signed)
Focus on control, keep shoulder down Laying on your back hold a paper towel roll between your hands perform chest press(start with bent elbows, straighten elbows towards ceiling) 10 reps  Laying on your back hold a paper towel roll between your hands, and raise arms overhead to approximately 100* Shoulder flexion, keep shoulder blades against the bed 10 reps each  Laying on your back hold a paper towel roll between your hands, straighten arms to ceiling then bend elbows to bring towel roll behind your head, (triceps) then extend your elbow 10 reps  Seated hold paper towel roll between your palms , raise arms to grossly 90* shoulder flexion, do not hike your shoulder 10 reps 1 x day

## 2020-08-29 ENCOUNTER — Ambulatory Visit: Payer: Medicare Other | Admitting: Occupational Therapy

## 2020-08-29 ENCOUNTER — Ambulatory Visit: Payer: Medicare Other | Admitting: Physical Therapy

## 2020-08-29 ENCOUNTER — Encounter: Payer: Self-pay | Admitting: Physical Therapy

## 2020-08-29 ENCOUNTER — Other Ambulatory Visit: Payer: Self-pay

## 2020-08-29 VITALS — BP 121/72 | HR 62

## 2020-08-29 DIAGNOSIS — R2689 Other abnormalities of gait and mobility: Secondary | ICD-10-CM

## 2020-08-29 DIAGNOSIS — R41842 Visuospatial deficit: Secondary | ICD-10-CM

## 2020-08-29 DIAGNOSIS — M6281 Muscle weakness (generalized): Secondary | ICD-10-CM | POA: Diagnosis not present

## 2020-08-29 DIAGNOSIS — I639 Cerebral infarction, unspecified: Secondary | ICD-10-CM | POA: Diagnosis not present

## 2020-08-29 DIAGNOSIS — R2681 Unsteadiness on feet: Secondary | ICD-10-CM | POA: Diagnosis not present

## 2020-08-29 DIAGNOSIS — R278 Other lack of coordination: Secondary | ICD-10-CM | POA: Diagnosis not present

## 2020-08-29 NOTE — Therapy (Signed)
[facility] [address] [place], [place], [address] Phone: [phone]   Fax:  [phone]  Occupational Therapy Treatment  Patient Details  Name: Mitchell Lambert MRN: [id number] Date of Birth: 1950/04/10 Referring Provider (OT): Dr. Leonie Man   Encounter Date: 08/29/2020   OT End of Session - 08/29/20 0953     Visit Number 4    Number of Visits 24    Date for OT Re-Evaluation 11/12/20    Authorization Type Medicare    Authorization - Visit Number 4    Authorization - Number of Visits 10    Progress Note Due on Visit 10    OT Start Time 0850    OT Stop Time 0930    OT Time Calculation (min) 40 min    Behavior During Therapy WFL for tasks assessed/performed             Past Medical History:  Diagnosis Date   Angina    NO ANGINA SINCE CABG   Arthritis    right ankle   Chronic daily headache    "@ least every other day""improved since heart surgery"   Coronary artery disease    PT [place] CABG SURGERY - DR. TILLEY IS HIS CARDIOLOGIST   Diabetes mellitus    Lantus x 10 yrs   Gout    History of kidney stones 09-08-12   multiple times with some lithotripsies   Hyperlipemia    Hypertension     Past Surgical History:  Procedure Laterality Date   BACK SURGERY     microsurgery: spinal stenosis -lumbar   bone spur  1990's   right great toe; "probably related to gout"   BUBBLE STUDY  06/17/2020   Procedure: BUBBLE STUDY;  Surgeon: Lelon Perla, MD;  Location: [facility];  Service: Cardiovascular;;   CARDIAC CATHETERIZATION  04/27/11   CATARACT EXTRACTION W/ INTRAOCULAR LENS IMPLANT  1990's   right; "lens was replaced twice over 3 months; lens is actually over iris"   CORONARY ARTERY BYPASS GRAFT  04/30/2011   Procedure: CORONARY ARTERY BYPASS GRAFTING (CABG);  Surgeon: Melrose Nakayama, MD;  Location: [facility];  Service: Open Heart Surgery;  Laterality: N/A;,x4 vessels    CYSTOSCOPY W/ URETERAL STENT PLACEMENT Right 08/19/2012   Procedure: CYSTOSCOPY WITH RETROGRADE PYELOGRAM/URETERAL STENT PLACEMENT;  Surgeon: Molli Hazard, MD;  [facility];  Service: Urology;  Laterality: Right;   CYSTOSCOPY WITH RETROGRADE PYELOGRAM, URETEROSCOPY AND STENT PLACEMENT Left 07/17/2013   Procedure: CYSTOSCOPY, LEFT URETEROSCOPY, BASKET EXTRACTION OF STONE, INSERTION OF LEFT URETERAL STENT, ;  Surgeon: Jorja Loa, MD;  [facility];  Service: Urology;  Laterality: Left;   CYSTOSCOPY/RETROGRADE/URETEROSCOPY Right 09/12/2012   Procedure: CYSTOSCOPY/RETROGRADE/URETEROSCOPY;  Surgeon: Franchot Gallo, MD;  [facility];  Service: Urology;  Laterality: Right;   HOLMIUM LASER APPLICATION Right 6/56/8127   Procedure: HOLMIUM LASER APPLICATION;  Surgeon: Franchot Gallo, MD;  [facility];  Service: Urology;  Laterality: Right;   HOLMIUM LASER APPLICATION Left 07/17/15   Procedure: HOLMIUM LASER APPLICATION;  Surgeon: Jorja Loa, MD;  [facility];  Service: Urology;  Laterality: Left;   KIDNEY STONE SURGERY     "probably 3 times"   LEFT HEART CATHETERIZATION WITH CORONARY ANGIOGRAM N/A 04/28/2011   Procedure: LEFT HEART CATHETERIZATION WITH CORONARY ANGIOGRAM;  Surgeon: Jacolyn Reedy, MD;  Location: [facility] CATH LAB;  Service: Cardiovascular;  Laterality: N/A;   LITHOTRIPSY     "many; probably  5 times"   RADIAL ARTERY HARVEST  04/30/2011   Procedure: RADIAL ARTERY HARVEST;  Surgeon: Melrose Nakayama, MD;  Location: [facility];  Service: Open Heart Surgery;  Laterality: Left;   RETINAL DETACHMENT SURGERY  1990's   right eye; "made me have an early cataract"   TEE WITHOUT CARDIOVERSION N/A 06/17/2020   Procedure: TRANSESOPHAGEAL ECHOCARDIOGRAM (TEE);  Surgeon: Lelon Perla, MD;  Location: [facility] ENDOSCOPY;  Service: Cardiovascular;  Laterality: N/A;   VASECTOMY      There were no vitals filed for this visit.   Subjective Assessment -  08/29/20 0954     Subjective  Pt reports  exercising his eyes a lot yesterday    Pertinent History 70 y/o male presented to  ED for concern of dizziness and diplopia on 07/21/20. MRI showed acute L pontine CVA and prior L parietal infract.  PMH: CAD s/p CABG, HTN, asthma, type 2 diabetes, CKD stage III, HLD, remote R retinal detachment, HTN, CVA.    Patient Stated Goals regain use of RUE    Currently in Pain? No/denies                    Treatment: Seated closed chain chest press and shoulder flexion to grossly 90, min v.c to avoid compensation. Standing at counter rocking forwards and backwards for weightbearing, minguard assist, min v.c Fine motor coordination activities from HEP: flipping and dealing cards, staking and manipulating coins, tossing ball between hands, min difficulty/ v.c Stacking blocks with RUE, min v.c to avoid compensation with shoulder. Simulated drumming using primarily wrist and forearm, good performance. Pt was encouraged to perform at home but to avoid high reach.               OT Education - 08/29/20 [phone]     Education Details discussion regarding avoid overdoing eye exercises, pt reports perfroming 4 x yesterday and his left eye appears very fatigues today, also discussed avoiding over use of putty that was issued in the hospital. Pt reports he did not sleep well and that may have contributed to his visual fatigue    Person(s) Educated Patient    Methods Explanation    Comprehension Verbalized understanding              OT Short Term Goals - 08/20/20 1432       OT SHORT TERM GOAL #1   Title I with HEP for vision, coordination    Time 4    Period Weeks    Status New    Target Date 09/17/20      OT SHORT TERM GOAL #2   Title Pt will demonstrate improved RUE fine motor coordination for ADLS as evidenced by decreasing 9 hole peg test score to 52 secs or less    Time 4    Period Weeks    Status New      OT SHORT TERM GOAL #3    Title Pt will report that he is cutting food independently and  feeding himself with RUE at least 75% of the time.    Time 4    Period Weeks    Status New      OT SHORT TERM GOAL #4   Title Pt will perfrom all basic ADLS with distant supervision.    Time 4    Period Weeks    Status New      OT SHORT TERM GOAL #5   Title Pt will demonstrate ability to retrieve a lightweight  object at 110* shoulder flexion with good control and min compensation.    Time 4    Period Weeks    Status New      Additional Short Term Goals   Additional Short Term Goals Yes      OT SHORT TERM GOAL #6   Title Pt will increase RUE grip strength to 45 lbs or greater for increased functional use.    Time 4    Period Weeks    Status New      OT SHORT TERM GOAL #7   Title Pt will verbalzie understanding of compensatroy strategies for visual deficits    Time 4    Period Weeks    Status New               OT Long Term Goals - 08/20/20 1436       OT LONG TERM GOAL #1   Title I with all basic ADLS.    Time 12    Period Weeks    Status New    Target Date 11/12/20      OT LONG TERM GOAL #2   Title Pt will demonstrate improved fine motor coordiantion for ADLs as evidenced by performing 9 hole peg test in 45 secs or less.    Time 12    Period Weeks    Status New      OT LONG TERM GOAL #3   Title Pt will perform simple snack/ beverage prep modified Independently.    Time 12    Period Weeks    Status New      OT LONG TERM GOAL #4   Title Pt will retrieve a 3 lbs object with RUE at 115 shoulder flexion with good control.    Time 12    Period Weeks    Status New      OT LONG TERM GOAL #5   Title Pt will perfrom simulated work activities modified Independently    Time 12    Period Weeks    Status New      Long Term Additional Goals   Additional Long Term Goals Yes      OT LONG TERM GOAL #6   Title Pt will perfrom tabletop and environmental scanning with 90% or better accuracy in prep  for work activities.    Time 12    Period Weeks    Status New      OT LONG TERM GOAL #7   Title --    Time --    Period --    Status --                   Plan - 08/29/20 0927     Clinical Impression Statement Pt is progressing towards goals. Pt demonstrates continuing increased RUE functional use. He benefits from cueing for more normal movment patterns.    OT Occupational Profile and History Detailed Assessment- Review of Records and additional review of physical, cognitive, psychosocial history related to current functional performance    Occupational performance deficits (Please refer to evaluation for details): ADL's;IADL's;Play;Work;Leisure;Social Participation    Body Structure / Function / Physical Skills ADL;Balance;Endurance;Mobility;Strength;Flexibility;UE functional use;FMC;Coordination;Gait;Vision;ROM;GMC;Decreased knowledge of precautions;Decreased knowledge of use of DME;Dexterity;IADL    Rehab Potential Good    Clinical Decision Making Limited treatment options, no task modification necessary    Comorbidities Affecting Occupational Performance: May have comorbidities impacting occupational performance    Modification or Assistance to Complete Evaluation  No modification of tasks or  assist necessary to complete eval    OT Frequency 2x / week    OT Duration 12 weeks    OT Treatment/Interventions Self-care/ADL training;Therapeutic exercise;Functional Mobility Training;Balance training;Ultrasound;Neuromuscular education;Manual Therapy;Therapeutic activities;Paraffin;Cryotherapy;DME and/or AE instruction;Cognitive remediation/compensation;Visual/perceptual remediation/compensation;Fluidtherapy;Moist Heat;Electrical Stimulation;Passive range of motion;Patient/family education;Energy conservation;Aquatic Therapy;Gait Training    Plan NMR for normal movement seated and supine paper towel roll, avoid compensatory shoulder hike, fine motor coordination, pt is a Librarian, academic with Plan of Care Patient             Patient will benefit from skilled therapeutic intervention in order to improve the following deficits and impairments:   Body Structure / Function / Physical Skills: ADL, Balance, Endurance, Mobility, Strength, Flexibility, UE functional use, FMC, Coordination, Gait, Vision, ROM, GMC, Decreased knowledge of precautions, Decreased knowledge of use of DME, Dexterity, IADL       Visit Diagnosis: Muscle weakness (generalized)  Other lack of coordination  Visuospatial deficit  Unsteadiness on feet    Problem List Patient Active Problem List   Diagnosis Date Noted   Left pontine stroke ([facility]) 07/25/2020   Acute right hemiparesis ([place]) 07/25/2020   Essential hypertension    History of CVA (cerebrovascular accident)    CKD (chronic kidney disease), stage II    Diabetes mellitus type 2 in obese ([facility])    Dyslipidemia    CVA (cerebral vascular accident) ([facility]) 07/21/2020   Cryptogenic stroke ([place]) 06/18/2020   Acute-on-chronic kidney injury ([place]) 05/28/2020   Stroke ([facility]) 05/27/2020   Elevated troponin 01/13/2019   COVID-19 virus infection 01/13/2019   S/P CABG (coronary artery bypass graft)    CAD (coronary artery disease) 04/28/2011   Insulin dependent type 2 diabetes mellitus, controlled ([facility])    Hyperlipidemia    Hypertension    Obesity (BMI 30-39.9)    Nephrolithiasis, uric acid     Kriston Mckinnie 08/29/2020, 9:57 AM  [facility] [address] [place], [place], [address] Phone: [phone]   Fax:  [phone]  Name: JULIOCESAR BLASIUS MRN: [id number] Date of Birth: 1951-01-15

## 2020-08-29 NOTE — Therapy (Addendum)
[facility] [address] [place], [place], [address] Phone: [phone]   Fax:  [phone]  Physical Therapy Treatment  Patient Details  Name: Mitchell Lambert MRN: [id number] Date of Birth: Jul 15, 1950 Referring Provider (PT): Garvin Fila, MD   Encounter Date: 08/29/2020   PT End of Session - 08/29/20 1017     Visit Number 4    Number of Visits 17    Date for PT Re-Evaluation 10/15/20    Authorization Type Medicare - will need 10th visit PN    Progress Note Due on Visit 10    PT Start Time 0931    PT Stop Time 1015    PT Time Calculation (min) 44 min    Equipment Utilized During Treatment Gait belt    Activity Tolerance Patient tolerated treatment well    Behavior During Therapy WFL for tasks assessed/performed             Past Medical History:  Diagnosis Date   Angina    NO ANGINA SINCE CABG   Arthritis    right ankle   Chronic daily headache    "@ least every other day""improved since heart surgery"   Coronary artery disease    PT [place] CABG SURGERY - DR. TILLEY IS HIS CARDIOLOGIST   Diabetes mellitus    Lantus x 10 yrs   Gout    History of kidney stones 09-08-12   multiple times with some lithotripsies   Hyperlipemia    Hypertension     Past Surgical History:  Procedure Laterality Date   BACK SURGERY     microsurgery: spinal stenosis -lumbar   bone spur  1990's   right great toe; "probably related to gout"   BUBBLE STUDY  06/17/2020   Procedure: BUBBLE STUDY;  Surgeon: Lelon Perla, MD;  Location: [facility];  Service: Cardiovascular;;   CARDIAC CATHETERIZATION  04/27/11   CATARACT EXTRACTION W/ INTRAOCULAR LENS IMPLANT  1990's   right; "lens was replaced twice over 3 months; lens is actually over iris"   CORONARY ARTERY BYPASS GRAFT  04/30/2011   Procedure: CORONARY ARTERY BYPASS GRAFTING (CABG);  Surgeon: Melrose Nakayama, MD;  Location: [facility];   Service: Open Heart Surgery;  Laterality: N/A;,x4 vessels   CYSTOSCOPY W/ URETERAL STENT PLACEMENT Right 08/19/2012   Procedure: CYSTOSCOPY WITH RETROGRADE PYELOGRAM/URETERAL STENT PLACEMENT;  Surgeon: Molli Hazard, MD;  [facility];  Service: Urology;  Laterality: Right;   CYSTOSCOPY WITH RETROGRADE PYELOGRAM, URETEROSCOPY AND STENT PLACEMENT Left 07/17/2013   Procedure: CYSTOSCOPY, LEFT URETEROSCOPY, BASKET EXTRACTION OF STONE, INSERTION OF LEFT URETERAL STENT, ;  Surgeon: Jorja Loa, MD;  [facility];  Service: Urology;  Laterality: Left;   CYSTOSCOPY/RETROGRADE/URETEROSCOPY Right 09/12/2012   Procedure: CYSTOSCOPY/RETROGRADE/URETEROSCOPY;  Surgeon: Franchot Gallo, MD;  [facility];  Service: Urology;  Laterality: Right;   HOLMIUM LASER APPLICATION Right 6/71/2458   Procedure: HOLMIUM LASER APPLICATION;  Surgeon: Franchot Gallo, MD;  [facility];  Service: Urology;  Laterality: Right;   HOLMIUM LASER APPLICATION Left 0/99/8338   Procedure: HOLMIUM LASER APPLICATION;  Surgeon: Jorja Loa, MD;  [facility];  Service: Urology;  Laterality: Left;   KIDNEY STONE SURGERY     "probably 3 times"   LEFT HEART CATHETERIZATION WITH CORONARY ANGIOGRAM N/A 04/28/2011   Procedure: LEFT HEART CATHETERIZATION WITH CORONARY ANGIOGRAM;  Surgeon: Jacolyn Reedy, MD;  Location: [facility] CATH LAB;  Service: Cardiovascular;  Laterality: N/A;  LITHOTRIPSY     "many; probably 5 times"   RADIAL ARTERY HARVEST  04/30/2011   Procedure: RADIAL ARTERY HARVEST;  Surgeon: Melrose Nakayama, MD;  Location: [facility];  Service: Open Heart Surgery;  Laterality: Left;   RETINAL DETACHMENT SURGERY  1990's   right eye; "made me have an early cataract"   TEE WITHOUT CARDIOVERSION N/A 06/17/2020   Procedure: TRANSESOPHAGEAL ECHOCARDIOGRAM (TEE);  Surgeon: Lelon Perla, MD;  Location: [facility] ENDOSCOPY;  Service: Cardiovascular;  Laterality: N/A;   VASECTOMY      Vitals:    08/29/20 0937  BP: 121/72  Pulse: 62     Subjective Assessment - 08/29/20 0934     Subjective Ankle is feeling better today. Did not sleep well last night.    Patient is accompained by: Family member    Pertinent History achilles tendon tear on L    Limitations Standing;Sitting;House hold activities    How long can you stand comfortably? No issues    How long can you walk comfortably? In house only    Patient Stated Goals Return to their own home, play drums again    Currently in Pain? No/denies                               [facility] Adult PT Treatment/Exercise - 08/29/20 0942       Transfers   Transfers Sit to Stand;Stand to Sit    Sit to Stand 5: Supervision    Comments x10 reps with RLE staggered behind L for incr weight bearing, using UE support and none to sit back down      Ambulation/Gait   Ambulation/Gait Yes    Ambulation/Gait Assistance 5: Supervision    Ambulation/Gait Assistance Details noted R genu recurvatum and end of next session, when ambulating with AFO today after 1 lap did notice a couple episodes mild genu recurvatum with cues for pt to keep a "soft knee" during gait. discussed a potential heel wedge in pt's shoe - pt trialed one in the hospital and it irritated his forefoot by putting too much pressure, discussed will continue to strengthen and if its not getting better may trial a smaller one in the future, pt in agreement with plan.    Ambulation Distance (Feet) 230 Feet   x1   Assistive device Rolling walker    Gait Pattern Step-through pattern;Decreased stance time - right;Decreased dorsiflexion - right;Decreased weight shift to right;Right foot flat    Ambulation Surface Level;Indoor      Neuro Re-ed    Neuro Re-ed Details  standing mini squats with 2" block under LLE for incr weight bearing to RLE, 2 x 10 reps cues for technique, no UE support with RW in front of pt      Exercises   Exercises Other Exercises    Other Exercises   seated resisted R knee flexion and extension with green tband (reviewed from HEP) x10 reps each cues for 2-3 second hold      Knee/Hip Exercises: Stretches   Gastroc Stretch Right;30 seconds;2 reps    Gastroc Stretch Limitations standing with holding onto chair, cues for proper technique, will review at next session and likely add to HEP                 Balance Exercises - 08/29/20 1006       Balance Exercises: Standing   Tandem Stance Eyes open;2 reps;30 secs;Limitations  Tandem Stance Time with RLE posteriorly, almost full tandem, needing UE support as needed in // bars    SLS with Vectors Upper extremity assist 2;Solid surface;Limitations    SLS with Vectors Limitations standing in // bars alternating toe taps to 6" step x12 reps going from BUE support > fingertip, cues for R hip/knee flexion               PT Education - 08/29/20 1016     Education Details discussed pt is not overdoing his exercises at home as he previously irritated his R ankle when doing so.    Person(s) Educated Patient    Methods Explanation    Comprehension Verbalized understanding              PT Short Term Goals - 08/20/20 1651       PT SHORT TERM GOAL #1   Title Pt will be independent with initial HEP    Time 4    Period Weeks    Status New    Target Date 09/17/20      PT SHORT TERM GOAL #2   Title Pt will be able to demo at least 3/5 R ankle DF to play on his drums    Time 4    Period Weeks    Status New    Target Date 09/17/20      PT SHORT TERM GOAL #3   Title Pt will be able to walk >500' with LRAD and SBA for improved home/community mobility    Time 4    Period Weeks    Status New    Target Date 09/17/20      PT SHORT TERM GOAL #4   Title Pt will be able to ascend/descend at least 20 steps with railing and SBA for safe return home    Baseline Performed 4 steps with bilat rails and min A    Time 4    Period Weeks    Status New    Target Date 09/17/20                PT Long Term Goals - 08/20/20 1703       PT LONG TERM GOAL #1   Title Pt will be independent with advanced HEP for strengthening and balance    Time 8    Period Weeks    Status New    Target Date 10/15/20      PT LONG TERM GOAL #2   Title Pt will have improved Berg Balance Score of at least 45/56 to indicate decreased fall risk    Baseline 39/56    Time 8    Period Weeks    Status New    Target Date 10/15/20      PT LONG TERM GOAL #3   Title Pt will have improved DGI score to at least 19/24 to demo decreased fall risk    Baseline 17/24    Time 8    Period Weeks    Status New    Target Date 10/15/20      PT LONG TERM GOAL #4   Title Pt will be able to amb at least 1000' with LRAD mod I on level and unlevel surfaces for community amb    Baseline Needs use of RW    Time 8    Period Weeks    Status New    Target Date 10/15/20      PT LONG TERM GOAL #5   Title Pt  will be able to use R foot/ankle for drumming to return to leisure activities    Time 8    Period Weeks    Status New    Target Date 10/15/20                   Plan - 08/29/20 1127     Clinical Impression Statement Noted R genu recurvatum at end of last session, did not trial a heel wedge today as pt reports he tried it in the hospital and it irritated his fore foot. When pt really concentrates on it, pt with improved knee control. Will continue to assess going forward with PT and determine if need to trial a smaller heel wedge. Remainder of session focused on R ankle ROM, standing balance, and RLE strengthening. Pt tolerated session well, will continue to progress towards LTGs.    Personal Factors and Comorbidities Age;Fitness;Time since onset of injury/illness/exacerbation;Comorbidity 1;Past/Current Experience    Comorbidities L Achilles tendon tear, chronic R LE edema, CAD s/p CABG, HTN, asthma, type 2 diabetes, CKD stage III, HLD, remote R retinal detachment, HTN, CVA.     Examination-Activity Limitations Squat;Stairs;Caring for Others;Locomotion Level    Examination-Participation Restrictions Cleaning;Community Activity;Driving;Occupation;Shop;Yard Work    Merchant [employer] officer Evolving/Moderate complexity    Rehab Potential Good    PT Frequency 2x / week    PT Duration 8 weeks    PT Treatment/Interventions ADLs/Self Care Home Management;Aquatic Therapy;Cryotherapy;Electrical Stimulation;Moist Heat;DME Instruction;Gait training;Stair training;Functional mobility training;Therapeutic activities;Therapeutic exercise;Balance training;Neuromuscular re-education;Manual techniques;Patient/family education;Passive range of motion;Taping;Joint Manipulations    PT Next Visit Plan perform standing gastroc stretch again and add to HEP. Continue to progress R LE strengthening (and working on active DF/PF) Work on R LE coordination/control. might need to try again a heel wedge in the future if continued recurvatum. work towards stairs.    PT Home Exercise Plan Access Code: [email or web address]    Consulted and Agree with Plan of Care Patient;Family member/caregiver    Family Member Consulted Wife             Patient will benefit from skilled therapeutic intervention in order to improve the following deficits and impairments:  Abnormal gait, Decreased range of motion, Difficulty walking, Increased fascial restricitons, Impaired tone, Decreased activity tolerance, Decreased balance, Decreased mobility, Decreased strength, Increased edema  Visit Diagnosis: Muscle weakness (generalized)  Unsteadiness on feet  Other abnormalities of gait and mobility     Problem List Patient Active Problem List   Diagnosis Date Noted   Left pontine stroke ([place]) 07/25/2020   Acute right hemiparesis ([place]) 07/25/2020   Essential hypertension    History of CVA (cerebrovascular accident)    CKD (chronic kidney disease), stage II    Diabetes mellitus type 2 in obese ([facility])     Dyslipidemia    CVA (cerebral vascular accident) ([place]) 07/21/2020   Cryptogenic stroke ([place]) 06/18/2020   Acute-on-chronic kidney injury ([place]) 05/28/2020   Stroke ([place]) 05/27/2020   Elevated troponin 01/13/2019   COVID-19 virus infection 01/13/2019   S/P CABG (coronary artery bypass graft)    CAD (coronary artery disease) 04/28/2011   Insulin dependent type 2 diabetes mellitus, controlled ([place])    Hyperlipidemia    Hypertension    Obesity (BMI 30-39.9)    Nephrolithiasis, uric acid     Arliss Journey, PT, DPT  08/29/2020, 11:30 AM  [facility] [address] [place], [place], [address] Phone: [phone]   Fax:  [phone]  Name: Mitchell  KEIDAN Lambert MRN: [id number] Date of Birth: 1951-01-16

## 2020-09-04 ENCOUNTER — Ambulatory Visit: Payer: Medicare Other | Admitting: Occupational Therapy

## 2020-09-04 ENCOUNTER — Ambulatory Visit: Payer: Medicare Other

## 2020-09-10 ENCOUNTER — Other Ambulatory Visit: Payer: Self-pay

## 2020-09-10 ENCOUNTER — Encounter: Payer: Self-pay | Admitting: Occupational Therapy

## 2020-09-10 ENCOUNTER — Ambulatory Visit: Payer: Medicare Other | Admitting: Occupational Therapy

## 2020-09-10 ENCOUNTER — Ambulatory Visit: Payer: Medicare Other | Attending: Family Medicine

## 2020-09-10 DIAGNOSIS — G8191 Hemiplegia, unspecified affecting right dominant side: Secondary | ICD-10-CM | POA: Diagnosis not present

## 2020-09-10 DIAGNOSIS — M6281 Muscle weakness (generalized): Secondary | ICD-10-CM | POA: Diagnosis not present

## 2020-09-10 DIAGNOSIS — R2689 Other abnormalities of gait and mobility: Secondary | ICD-10-CM | POA: Insufficient documentation

## 2020-09-10 DIAGNOSIS — R41842 Visuospatial deficit: Secondary | ICD-10-CM | POA: Insufficient documentation

## 2020-09-10 DIAGNOSIS — R2681 Unsteadiness on feet: Secondary | ICD-10-CM | POA: Diagnosis not present

## 2020-09-10 DIAGNOSIS — R278 Other lack of coordination: Secondary | ICD-10-CM | POA: Insufficient documentation

## 2020-09-10 NOTE — Therapy (Signed)
[facility] [address] [place], [place], [address] Phone: [phone]   Fax:  [phone]  Physical Therapy Treatment  Patient Details  Name: Mitchell Lambert MRN: [id number] Date of Birth: 1950/12/19 Referring Provider (PT): Garvin Fila, MD   Encounter Date: 09/10/2020   PT End of Session - 09/10/20 1233     Visit Number 5    Number of Visits 17    Date for PT Re-Evaluation 10/15/20    Authorization Type Medicare - will need 10th visit PN    Progress Note Due on Visit 10    PT Start Time 1233    PT Stop Time 1314    PT Time Calculation (min) 41 min    Equipment Utilized During Treatment Gait belt    Activity Tolerance Patient tolerated treatment well    Behavior During Therapy WFL for tasks assessed/performed             Past Medical History:  Diagnosis Date   Angina    NO ANGINA SINCE CABG   Arthritis    right ankle   Chronic daily headache    "@ least every other day""improved since heart surgery"   Coronary artery disease    PT [place] CABG SURGERY - DR. TILLEY IS HIS CARDIOLOGIST   Diabetes mellitus    Lantus x 10 yrs   Gout    History of kidney stones 09-08-12   multiple times with some lithotripsies   Hyperlipemia    Hypertension     Past Surgical History:  Procedure Laterality Date   BACK SURGERY     microsurgery: spinal stenosis -lumbar   bone spur  1990's   right great toe; "probably related to gout"   BUBBLE STUDY  06/17/2020   Procedure: BUBBLE STUDY;  Surgeon: Lelon Perla, MD;  Location: [facility];  Service: Cardiovascular;;   CARDIAC CATHETERIZATION  04/27/11   CATARACT EXTRACTION W/ INTRAOCULAR LENS IMPLANT  1990's   right; "lens was replaced twice over 3 months; lens is actually over iris"   CORONARY ARTERY BYPASS GRAFT  04/30/2011   Procedure: CORONARY ARTERY BYPASS GRAFTING (CABG);  Surgeon: Melrose Nakayama, MD;  Location: [facility];   Service: Open Heart Surgery;  Laterality: N/A;,x4 vessels   CYSTOSCOPY W/ URETERAL STENT PLACEMENT Right 08/19/2012   Procedure: CYSTOSCOPY WITH RETROGRADE PYELOGRAM/URETERAL STENT PLACEMENT;  Surgeon: Molli Hazard, MD;  Location: [facility];  Service: Urology;  Laterality: Right;   CYSTOSCOPY WITH RETROGRADE PYELOGRAM, URETEROSCOPY AND STENT PLACEMENT Left 07/17/2013   Procedure: CYSTOSCOPY, LEFT URETEROSCOPY, BASKET EXTRACTION OF STONE, INSERTION OF LEFT URETERAL STENT, ;  Surgeon: Jorja Loa, MD;  Location: [facility];  Service: Urology;  Laterality: Left;   CYSTOSCOPY/RETROGRADE/URETEROSCOPY Right 09/12/2012   Procedure: CYSTOSCOPY/RETROGRADE/URETEROSCOPY;  Surgeon: Franchot Gallo, MD;  Location: [facility];  Service: Urology;  Laterality: Right;   HOLMIUM LASER APPLICATION Right 4/91/7915   Procedure: HOLMIUM LASER APPLICATION;  Surgeon: Franchot Gallo, MD;  Location: [facility];  Service: Urology;  Laterality: Right;   HOLMIUM LASER APPLICATION Left 0/56/9794   Procedure: HOLMIUM LASER APPLICATION;  Surgeon: Jorja Loa, MD;  Location: [facility];  Service: Urology;  Laterality: Left;   KIDNEY STONE SURGERY     "probably 3 times"   LEFT HEART CATHETERIZATION WITH CORONARY ANGIOGRAM N/A 04/28/2011   Procedure: LEFT HEART CATHETERIZATION WITH CORONARY ANGIOGRAM;  Surgeon: Jacolyn Reedy, MD;  Location: [facility];  Service: Cardiovascular;  Laterality: N/A;  LITHOTRIPSY     "many; probably 5 times"   RADIAL ARTERY HARVEST  04/30/2011   Procedure: RADIAL ARTERY HARVEST;  Surgeon: Melrose Nakayama, MD;  Location: [facility];  Service: Open Heart Surgery;  Laterality: Left;   RETINAL DETACHMENT SURGERY  1990's   right eye; "made me have an early cataract"   TEE WITHOUT CARDIOVERSION N/A 06/17/2020   Procedure: TRANSESOPHAGEAL ECHOCARDIOGRAM (TEE);  Surgeon: Lelon Perla, MD;  Location: [facility]OS[facility];  Service: Cardiovascular;  Laterality: N/A;   VASECTOMY      There were no  vitals filed for this visit.   Subjective Assessment - 09/10/20 1236     Subjective No new changes/complaints. Patient has had some acheness in the R Proximal Arm.    Patient is accompained by: Family member    Pertinent History achilles tendon tear on L    Limitations Standing;Sitting;House hold activities    How long can you stand comfortably? No issues    How long can you walk comfortably? In house only    Patient Stated Goals Return to their own home, play drums again    Currently in Pain? Yes    Pain Score 6     Pain Location Arm    Pain Orientation Right    Pain Descriptors / Indicators Aching;Dull    Aggravating Factors  movement; certain sleeping positions.    Pain Relieving Factors rest                               [facility] Adult PT Treatment/Exercise - 09/10/20 0001       Transfers   Transfers Sit to Stand;Stand to Sit    Sit to Stand 5: Supervision    Stand to Sit 5: Supervision    Number of Reps 10 reps;2 sets    Comments completed sit <> stands with LLE placed on 2" step to promote weight shift to RLE, completed 2 sets x 10 reps, no UE support.      Ambulation/Gait   Ambulation/Gait Yes    Ambulation/Gait Assistance 5: Supervision    Ambulation/Gait Assistance Details throughout therapy gym with activities. cues for soft knee.    Ambulation Distance (Feet) --   clinic distance   Assistive device Rolling walker    Gait Pattern Step-through pattern;Decreased stance time - right;Decreased dorsiflexion - right;Decreased weight shift to right;Right foot flat    Ambulation Surface Level;Indoor      Exercises   Exercises Knee/Hip;Other Exercises    Other Exercises  Completed terminal knee extension on RLE with green theraband x 15 reps, cues for technique.      Knee/Hip Exercises: Stretches   Education administrator Limitations standing with holding onto chair, cues for proper technique. Provided handout for HEP.       Knee/Hip Exercises: Standing   Forward Step Up Right;2 sets;10 reps;Hand Hold: 2;Step Height: 6";Limitations    Forward Step Up Limitations cues for posture, leading with RLE 2 x 10 reps, intermittent rest break required due to fatigue.                 Balance Exercises - 09/10/20 0001       Balance Exercises: Standing   SLS with Vectors Intermittent upper extremity assist;Solid surface;Limitations    SLS with Vectors Limitations with single UE support on L, completed alternating toe taps to 4" step x 10 reps with UE uspport,  then x 5 reps without UE support and CGA. increased challenge with SLS on RLE, cues for soft bend to avoid recurvatum.            Access Code: [email or web address] URL: [email or web address] Date: 09/10/2020 Prepared by: Baldomero Lamy  Exercises Clamshell - 1 x daily - 7 x weekly - 3 sets - 10 reps Supine Bridge - 1 x daily - 7 x weekly - 3 sets - 10 reps Supine Active Straight Leg Raise - 1 x daily - 7 x weekly - 3 sets - 10 reps Seated Knee Flexion with Anchored Resistance - 1 x daily - 7 x weekly - 3 sets - 10 reps Seated Knee Extension with Resistance - 1 x daily - 7 x weekly - 3 sets - 10 reps Standing Soleus Stretch on Step - 4 x daily - 7 x weekly - 30 sec hold Standing Gastroc Stretch - 1 x daily - 7 x weekly - 1 sets - 3 reps - 30 seconds hold    PT Education - 09/10/20 1321     Education Details Gastroc Stretch Addition    Person(s) Educated Patient    Methods Explanation;Demonstration;Handout    Comprehension Verbalized understanding;Returned demonstration              PT Short Term Goals - 08/20/20 1651       PT SHORT TERM GOAL #1   Title Pt will be independent with initial HEP    Time 4    Period Weeks    Status New    Target Date 09/17/20      PT SHORT TERM GOAL #2   Title Pt will be able to demo at least 3/5 R ankle DF to play on his drums    Time 4    Period Weeks    Status New    Target Date 09/17/20       PT SHORT TERM GOAL #3   Title Pt will be able to walk >500' with LRAD and SBA for improved home/community mobility    Time 4    Period Weeks    Status New    Target Date 09/17/20      PT SHORT TERM GOAL #4   Title Pt will be able to ascend/descend at least 20 steps with railing and SBA for safe return home    Baseline Performed 4 steps with bilat rails and min A    Time 4    Period Weeks    Status New    Target Date 09/17/20               PT Long Term Goals - 08/20/20 1703       PT LONG TERM GOAL #1   Title Pt will be independent with advanced HEP for strengthening and balance    Time 8    Period Weeks    Status New    Target Date 10/15/20      PT LONG TERM GOAL #2   Title Pt will have improved Berg Balance Score of at least 45/56 to indicate decreased fall risk    Baseline 39/56    Time 8    Period Weeks    Status New    Target Date 10/15/20      PT LONG TERM GOAL #3   Title Pt will have improved DGI score to at least 19/24 to demo decreased fall risk    Baseline 17/24    Time 8    Period  Weeks    Status New    Target Date 10/15/20      PT LONG TERM GOAL #4   Title Pt will be able to amb at least 1000' with LRAD mod I on level and unlevel surfaces for community amb    Baseline Needs use of RW    Time 8    Period Weeks    Status New    Target Date 10/15/20      PT LONG TERM GOAL #5   Title Pt will be able to use R foot/ankle for drumming to return to leisure activities    Time 8    Period Weeks    Status New    Target Date 10/15/20                   Plan - 09/10/20 1322     Clinical Impression Statement Review standing gastroc stretch today and provided handout as addition to HEP. Continued activites to promote improved R quad strengthening, and NMR promoting improved weight shift onto RLE and stance. Intermittent rest breaks required due to fatigue. Will continue to progress toward all LTGs    Personal Factors and Comorbidities  Age;Fitness;Time since onset of injury/illness/exacerbation;Comorbidity 1;Past/Current Experience    Comorbidities L Achilles tendon tear, chronic R LE edema, CAD s/p CABG, HTN, asthma, type 2 diabetes, CKD stage III, HLD, remote R retinal detachment, HTN, CVA.    Examination-Activity Limitations Squat;Stairs;Caring for Others;Locomotion Level    Examination-Participation Restrictions Cleaning;Community Activity;Driving;Occupation;Shop;Yard Work    [employer] Evolving/Moderate complexity    Rehab Potential Good    PT Frequency 2x / week    PT Duration 8 weeks    PT Treatment/Interventions ADLs/Self Care Home Management;Aquatic Therapy;Cryotherapy;Electrical Stimulation;Moist Heat;DME Instruction;Gait training;Stair training;Functional mobility training;Therapeutic activities;Therapeutic exercise;Balance training;Neuromuscular re-education;Manual techniques;Patient/family education;Passive range of motion;Taping;Joint Manipulations    PT Next Visit Plan Continue to progress R LE strengthening (and working on active DF/PF) Work on R LE coordination/control. might need to try again a heel wedge in the future if continued recurvatum. work towards stairs.    PT Home Exercise Plan Access Code: [email or web address]    Consulted and Agree with Plan of Care Patient;Family member/caregiver    Family Member Consulted Wife             Patient will benefit from skilled therapeutic intervention in order to improve the following deficits and impairments:  Abnormal gait, Decreased range of motion, Difficulty walking, Increased fascial restricitons, Impaired tone, Decreased activity tolerance, Decreased balance, Decreased mobility, Decreased strength, Increased edema  Visit Diagnosis: Muscle weakness (generalized)  Unsteadiness on feet  Other abnormalities of gait and mobility     Problem List Patient Active Problem List   Diagnosis Date Noted   Left pontine stroke ([facility]) 07/25/2020    Acute right hemiparesis ([facility]) 07/25/2020   Essential hypertension    History of CVA (cerebrovascular accident)    CKD (chronic kidney disease), stage II    Diabetes mellitus type 2 in obese ([facility])    Dyslipidemia    CVA (cerebral vascular accident) ([facility]) 07/21/2020   Cryptogenic stroke ([facility]) 06/18/2020   Acute-on-chronic kidney injury ([facility]) 05/28/2020   Stroke ([facility]) 05/27/2020   Elevated troponin 01/13/2019   COVID-19 virus infection 01/13/2019   S/P CABG (coronary artery bypass graft)    CAD (coronary artery disease) 04/28/2011   Insulin dependent type 2 diabetes mellitus, controlled ([facility])    Hyperlipidemia    Hypertension    Obesity (BMI 30-39.9)  Nephrolithiasis, uric acid     Jones Bales, PT, DPT 09/10/2020, 1:25 PM  [facility] [address] [place], [place], [address] Phone: [phone]   Fax:  [phone]  Name: DAYVEN LINSLEY MRN: [id number] Date of Birth: [birth year]

## 2020-09-10 NOTE — Patient Instructions (Signed)
Access Code: [email or web address] URL: [email or web address] Date: 09/10/2020 Prepared by: Baldomero Lamy  Exercises Clamshell - 1 x daily - 7 x weekly - 3 sets - 10 reps Supine Bridge - 1 x daily - 7 x weekly - 3 sets - 10 reps Supine Active Straight Leg Raise - 1 x daily - 7 x weekly - 3 sets - 10 reps Seated Knee Flexion with Anchored Resistance - 1 x daily - 7 x weekly - 3 sets - 10 reps Seated Knee Extension with Resistance - 1 x daily - 7 x weekly - 3 sets - 10 reps Standing Soleus Stretch on Step - 4 x daily - 7 x weekly - 30 sec hold Standing Gastroc Stretch - 1 x daily - 7 x weekly - 1 sets - 3 reps - 30 seconds hold

## 2020-09-10 NOTE — Therapy (Signed)
[facility] [address] [place], [place], [address] Phone: [phone]   Fax:  [phone]  Occupational Therapy Treatment  Patient Details  Name: Mitchell Lambert MRN: [id number] Date of Birth: 01/12/1951 Referring Provider (OT): Dr. Leonie Man   Encounter Date: 09/10/2020   OT End of Session - 09/10/20 1519     Visit Number 5    Number of Visits 24    Date for OT Re-Evaluation 11/12/20    Authorization Type Medicare    Authorization - Visit Number 5    Authorization - Number of Visits 10    Progress Note Due on Visit 10    OT Start Time 1315    OT Stop Time 1400    OT Time Calculation (min) 45 min    Activity Tolerance Patient tolerated treatment well    Behavior During Therapy WFL for tasks assessed/performed             Past Medical History:  Diagnosis Date   Angina    NO ANGINA SINCE CABG   Arthritis    right ankle   Chronic daily headache    "@ least every other day""improved since heart surgery"   Coronary artery disease    PT [facility] CABG SURGERY - DR. TILLEY IS HIS CARDIOLOGIST   Diabetes mellitus    Lantus x 10 yrs   Gout    History of kidney stones 09-08-12   multiple times with some lithotripsies   Hyperlipemia    Hypertension     Past Surgical History:  Procedure Laterality Date   BACK SURGERY     microsurgery: spinal stenosis -lumbar   bone spur  1990's   right great toe; "probably related to gout"   BUBBLE STUDY  06/17/2020   Procedure: BUBBLE STUDY;  Surgeon: Lelon Perla, MD;  Location: [place];  Service: Cardiovascular;;   CARDIAC CATHETERIZATION  04/27/11   CATARACT EXTRACTION W/ INTRAOCULAR LENS IMPLANT  1990's   right; "lens was replaced twice over 3 months; lens is actually over iris"   CORONARY ARTERY BYPASS GRAFT  04/30/2011   Procedure: CORONARY ARTERY BYPASS GRAFTING (CABG);  Surgeon: Melrose Nakayama, MD;  Location: [facility];  Service: Open  Heart Surgery;  Laterality: N/A;,x4 vessels   CYSTOSCOPY W/ URETERAL STENT PLACEMENT Right 08/19/2012   Procedure: CYSTOSCOPY WITH RETROGRADE PYELOGRAM/URETERAL STENT PLACEMENT;  Surgeon: Molli Hazard, MD;  Location[facility];  Service: Urology;  Laterality: Right;   CYSTOSCOPY WITH RETROGRADE PYELOGRAM, URETEROSCOPY AND STENT PLACEMENT Left 07/17/2013   Procedure: CYSTOSCOPY, LEFT URETEROSCOPY, BASKET EXTRACTION OF STONE, INSERTION OF LEFT URETERAL STENT, ;  Surgeon: Jorja Loa, MD;  [facility];  Service: Urology;  Laterality: Left;   CYSTOSCOPY/RETROGRADE/URETEROSCOPY Right 09/12/2012   Procedure: CYSTOSCOPY/RETROGRADE/URETEROSCOPY;  Surgeon: Franchot Gallo, MD;  [facility];  Service: Urology;  Laterality: Right;   HOLMIUM LASER APPLICATION Right 9/41/7408   Procedure: HOLMIUM LASER APPLICATION;  Surgeon: Franchot Gallo, MD;  [facility];  Service: Urology;  Laterality: Right;   HOLMIUM LASER APPLICATION Left 1/44/8185   Procedure: HOLMIUM LASER APPLICATION;  Surgeon: Jorja Loa, MD;  [facility];  Service: Urology;  Laterality: Left;   KIDNEY STONE SURGERY     "probably 3 times"   LEFT HEART CATHETERIZATION WITH CORONARY ANGIOGRAM N/A 04/28/2011   Procedure: LEFT HEART CATHETERIZATION WITH CORONARY ANGIOGRAM;  Surgeon: Jacolyn Reedy, MD;  Location: [facility] CATH LAB;  Service: Cardiovascular;  Laterality: N/A;  LITHOTRIPSY     "many; probably 5 times"   RADIAL ARTERY HARVEST  04/30/2011   Procedure: RADIAL ARTERY HARVEST;  Surgeon: Melrose Nakayama, MD;  Location: [facility];  Service: Open Heart Surgery;  Laterality: Left;   RETINAL DETACHMENT SURGERY  1990's   right eye; "made me have an early cataract"   TEE WITHOUT CARDIOVERSION N/A 06/17/2020   Procedure: TRANSESOPHAGEAL ECHOCARDIOGRAM (TEE);  Surgeon: Lelon Perla, MD;  Location: [facility] ENDOSCOPY;  Service: Cardiovascular;  Laterality: N/A;   VASECTOMY      There were no vitals filed for  this visit.   Subjective Assessment - 09/10/20 1513     Subjective  Patient reports improving left eye movement, and also reporting some natural compensation with gaze toward left    Pertinent History 70 y/o male presented to  ED for concern of dizziness and diplopia on 07/21/20. MRI showed acute L pontine CVA and prior L parietal infract.  PMH: CAD s/p CABG, HTN, asthma, type 2 diabetes, CKD stage III, HLD, remote R retinal detachment, HTN, CVA.    Patient Stated Goals regain use of RUE    Currently in Pain? 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  Discussed attempting to play drums with focus as exercise to address interlimb coordination, bimanual coordination.  Discussed role of shoulder and forearm to get hand into correct position for drumming (or any functional activity.)  Discussed role of posture to set up arm for optimal functioning.      Neurological Re-education Exercises   Other Exercises 1 Patient with reported ache in right lateral upper arm (deltoid insertion region) Patient feels pain increases with activity.  Upon further inspection - patient with unbalanced muscle activity, and having greater difficulty returning arm from flexion to rest at his side.  When guided movement - pain slightly reduced.  Worked on proper mechanics and postural control required for pre-reach patterns - supine then sitting.  Followed with fine motor coordination tasks with cueing for shoulder flex versus shoulder abd with IR.                       OT Short Term Goals - 09/10/20 1520       OT SHORT TERM GOAL #1   Title I with HEP for vision, coordination    Time 4    Period Weeks    Status On-going    Target Date 09/17/20      OT SHORT TERM GOAL #2   Title Pt will demonstrate improved RUE fine motor coordination for ADLS as evidenced by decreasing 9 hole peg test score to 52 secs or less    Time 4    Period Weeks    Status On-going      OT  SHORT TERM GOAL #3   Title Pt will report that he is cutting food independently and  feeding himself with RUE at least 75% of the time.    Time 4    Period Weeks    Status On-going      OT SHORT TERM GOAL #4   Title Pt will perfrom all basic ADLS with distant supervision.    Time 4    Period Weeks    Status On-going      OT SHORT TERM GOAL #5   Title Pt will demonstrate ability to retrieve a lightweight object at 110* shoulder flexion with good control and min compensation.    Time 4    Period Weeks    Status On-going      OT SHORT TERM GOAL #6   Title Pt will increase RUE grip strength to 45 lbs or greater for increased functional use.    Time 4    Period Weeks    Status On-going      OT SHORT TERM GOAL #7   Title Pt will verbalzie understanding of compensatroy strategies for visual deficits    Time 4    Period Weeks    Status On-going               OT Long Term Goals - 09/10/20 1521       OT LONG TERM GOAL #1   Title I with all basic ADLS.    Time 12    Period Weeks    Status On-going      OT LONG TERM GOAL #2   Title Pt will demonstrate improved fine motor coordination for ADLs as evidenced by performing 9 hole peg test in 45 secs or less.    Time 12    Period Weeks    Status On-going      OT LONG TERM GOAL #3   Title Pt will perform simple snack/ beverage prep modified Independently.    Time 12    Period Weeks    Status On-going      OT LONG TERM GOAL #4   Title Pt will retrieve a 3 lbs object with RUE at 115 shoulder  flexion with good control.    Time 12    Period Weeks    Status On-going      OT LONG TERM GOAL #5   Title Pt will perfrom simulated work activities modified Independently    Time 12    Period Weeks    Status On-going      OT LONG TERM GOAL #6   Title Pt will perfrom tabletop and environmental scanning with 90% or better accuracy in prep for work activities.    Time 12    Period Weeks    Status On-going                   Plan - 09/10/20 1519     Clinical Impression Statement Pt continues to benefit from cueing and modeling for more natural / balanced  movment patterns.    OT Occupational Profile and History Detailed Assessment- Review of Records and additional review of physical, cognitive, psychosocial history related to current functional performance    Occupational performance deficits (Please refer to evaluation for details): ADL's;IADL's;Play;Work;Leisure;Social Participation    Body Structure / Function / Physical Skills ADL;Balance;Endurance;Mobility;Strength;Flexibility;UE functional use;FMC;Coordination;Gait;Vision;ROM;GMC;Decreased knowledge of precautions;Decreased knowledge of use of DME;Dexterity;IADL    Rehab Potential Good    Clinical Decision Making Limited treatment options, no task modification  necessary    Comorbidities Affecting Occupational Performance: May have comorbidities impacting occupational performance    Modification or Assistance to Complete Evaluation  No modification of tasks or assist necessary to complete eval    OT Frequency 2x / week    OT Duration 12 weeks    OT Treatment/Interventions Self-care/ADL training;Therapeutic exercise;Functional Mobility Training;Balance training;Ultrasound;Neuromuscular education;Manual Therapy;Therapeutic activities;Paraffin;Cryotherapy;DME and/or AE instruction;Cognitive remediation/compensation;Visual/perceptual remediation/compensation;Fluidtherapy;Moist Heat;Electrical Stimulation;Passive range of  motion;Patient/family education;Energy conservation;Aquatic Therapy;Gait Training    Plan NMR for normal movement RUE, avoid compensatory shoulder hike, fine motor coordination, pt is a Librarian, academic with Plan of Care Patient             Patient will benefit from skilled therapeutic intervention in order to improve the following deficits and impairments:   Body Structure / Function / Physical Skills: ADL, Balance, Endurance, Mobility, Strength, Flexibility, UE functional use, FMC, Coordination, Gait, Vision, ROM, GMC, Decreased knowledge of precautions, Decreased knowledge of use of DME, Dexterity, IADL       Visit Diagnosis: Visuospatial deficit  Other lack of coordination  Unsteadiness on feet  Muscle weakness (generalized)    Problem List Patient Active Problem List   Diagnosis Date Noted   Left pontine stroke ([place]) 07/25/2020   Acute right hemiparesis ([place]) 07/25/2020   Essential hypertension    History of CVA (cerebrovascular accident)    CKD (chronic kidney disease), stage II    Diabetes mellitus type 2 in obese ([place])    Dyslipidemia    CVA (cerebral vascular accident) ([place]) 07/21/2020   Cryptogenic stroke ([place]) 06/18/2020   Acute-on-chronic kidney injury ([place]) 05/28/2020   Stroke ([place]) 05/27/2020   Elevated troponin 01/13/2019   COVID-19 virus infection 01/13/2019   S/P CABG (coronary artery bypass graft)    CAD (coronary artery disease) 04/28/2011   Insulin dependent type 2 diabetes mellitus, controlled ([place])    Hyperlipidemia    Hypertension    Obesity (BMI 30-39.9)    Nephrolithiasis, uric acid     Mariah Milling 09/10/2020, 3:22 PM  [facility] [address] [place], [place], [address] Phone: [phone]   Fax:  [phone]  Name: JAELIN FACKLER MRN: [id number] Date of Birth: 11-Jun-1950

## 2020-09-10 NOTE — Therapy (Deleted)
[facility] [address] [place], [place], [address] Phone: [phone]   Fax:  [phone]  Physical Therapy Treatment  Patient Details  Name: Mitchell Lambert MRN: [id number] Date of Birth: May 20, 1950 Referring Provider (PT): Garvin Fila, MD   Encounter Date: 09/10/2020   PT End of Session - 09/10/20 1233     Visit Number 5    Number of Visits 17    Date for PT Re-Evaluation 10/15/20    Authorization Type Medicare - will need 10th visit PN    Progress Note Due on Visit 10    PT Start Time 1233    PT Stop Time 1314    PT Time Calculation (min) 41 min    Equipment Utilized During Treatment Gait belt    Activity Tolerance Patient tolerated treatment well    Behavior During Therapy WFL for tasks assessed/performed             Past Medical History:  Diagnosis Date   Angina    NO ANGINA SINCE CABG   Arthritis    right ankle   Chronic daily headache    "@ least every other day""improved since heart surgery"   Coronary artery disease    PT [facility] CABG SURGERY - DR. TILLEY IS HIS CARDIOLOGIST   Diabetes mellitus    Lantus x 10 yrs   Gout    History of kidney stones 09-08-12   multiple times with some lithotripsies   Hyperlipemia    Hypertension     Past Surgical History:  Procedure Laterality Date   BACK SURGERY     microsurgery: spinal stenosis -lumbar   bone spur  1990's   right great toe; "probably related to gout"   BUBBLE STUDY  06/17/2020   Procedure: BUBBLE STUDY;  Surgeon: Lelon Perla, MD;  Location: [facility];  Service: Cardiovascular;;   CARDIAC CATHETERIZATION  04/27/11   CATARACT EXTRACTION W/ INTRAOCULAR LENS IMPLANT  1990's   right; "lens was replaced twice over 3 months; lens is actually over iris"   CORONARY ARTERY BYPASS GRAFT  04/30/2011   Procedure: CORONARY ARTERY BYPASS GRAFTING (CABG);  Surgeon: Melrose Nakayama, MD;  Location: [facility];   Service: Open Heart Surgery;  Laterality: N/A;,x4 vessels   CYSTOSCOPY W/ URETERAL STENT PLACEMENT Right 08/19/2012   Procedure: CYSTOSCOPY WITH RETROGRADE PYELOGRAM/URETERAL STENT PLACEMENT;  Surgeon: Molli Hazard, MD;  [facility];  Service: Urology;  Laterality: Right;   CYSTOSCOPY WITH RETROGRADE PYELOGRAM, URETEROSCOPY AND STENT PLACEMENT Left 07/17/2013   Procedure: CYSTOSCOPY, LEFT URETEROSCOPY, BASKET EXTRACTION OF STONE, INSERTION OF LEFT URETERAL STENT, ;  Surgeon: Jorja Loa, MD;  [facility];  Service: Urology;  Laterality: Left;   CYSTOSCOPY/RETROGRADE/URETEROSCOPY Right 09/12/2012   Procedure: CYSTOSCOPY/RETROGRADE/URETEROSCOPY;  Surgeon: Franchot Gallo, MD;  [facility];  Service: Urology;  Laterality: Right;   HOLMIUM LASER APPLICATION Right 8/67/6720   Procedure: HOLMIUM LASER APPLICATION;  Surgeon: Franchot Gallo, MD;  [facility];  Service: Urology;  Laterality: Right;   HOLMIUM LASER APPLICATION Left 9/47/0962   Procedure: HOLMIUM LASER APPLICATION;  Surgeon: Jorja Loa, MD;  [facility];  Service: Urology;  Laterality: Left;   KIDNEY STONE SURGERY     "probably 3 times"   LEFT HEART CATHETERIZATION WITH CORONARY ANGIOGRAM N/A 04/28/2011   Procedure: LEFT HEART CATHETERIZATION WITH CORONARY ANGIOGRAM;  Surgeon: Jacolyn Reedy, MD;  Location: [facility] C[facility] LAB;  Service: Cardiovascular;  Laterality: N/A;  LITHOTRIPSY     "many; probably 5 times"   RADIAL ARTERY HARVEST  04/30/2011   Procedure: RADIAL ARTERY HARVEST;  Surgeon: Melrose Nakayama, MD;  Location: [place];  Service: Open Heart Surgery;  Laterality: Left;   RETINAL DETACHMENT SURGERY  1990's   right eye; "made me have an early cataract"   TEE WITHOUT CARDIOVERSION N/A 06/17/2020   Procedure: TRANSESOPHAGEAL ECHOCARDIOGRAM (TEE);  Surgeon: Lelon Perla, MD;  Location: [facility]OS[facility];  Service: Cardiovascular;  Laterality: N/A;   VASECTOMY      There were no  vitals filed for this visit.   Subjective Assessment - 09/10/20 1236     Subjective No new changes/complaints. Patient has had some acheness in the R Proximal Arm.    Patient is accompained by: Family member    Pertinent History achilles tendon tear on L    Limitations Standing;Sitting;House hold activities    How long can you stand comfortably? No issues    How long can you walk comfortably? In house only    Patient Stated Goals Return to their own home, play drums again    Currently in Pain? Yes    Pain Score 6     Pain Location Arm    Pain Orientation Right    Pain Descriptors / Indicators Aching;Dull    Aggravating Factors  movement; certain sleeping positions.    Pain Relieving Factors rest              [facility] Adult PT Treatment/Exercise - 09/10/20 0001       Transfers   Transfers Sit to Stand;Stand to Sit    Sit to Stand 5: Supervision    Stand to Sit 5: Supervision    Number of Reps 10 reps;2 sets    Comments completed sit <> stands with LLE placed on 2" step to promote weight shift to RLE, completed 2 sets x 10 reps, no UE support.      Ambulation/Gait   Ambulation/Gait Yes    Ambulation/Gait Assistance 5: Supervision    Ambulation/Gait Assistance Details throughout therapy gym with activities. cues for soft knee.    Ambulation Distance (Feet) --   clinic distance   Assistive device Rolling walker    Gait Pattern Step-through pattern;Decreased stance time - right;Decreased dorsiflexion - right;Decreased weight shift to right;Right foot flat    Ambulation Surface Level;Indoor      Exercises   Exercises Knee/Hip      Knee/Hip Exercises: Stretches   Gastroc Stretch Right;30 seconds;2 reps    Gastroc Stretch Limitations standing with holding onto chair, cues for proper technique. Provided handout for HEP.      Knee/Hip Exercises: Standing   Forward Step Up Right;2 sets;10 reps;Hand Hold: 2;Step Height: 6";Limitations    Forward Step Up Limitations cues for  posture, leading with RLE 2 x 10 reps, intermittent rest break required due to fatigue.              Balance Exercises - 09/10/20 0001       Balance Exercises: Standing   SLS with Vectors Intermittent upper extremity assist;Solid surface;Limitations    SLS with Vectors Limitations with single UE support on L, completed alternating toe taps to 4" step x 10 reps with UE uspport, then x 5 reps without UE support and CGA. increased challenge with SLS on RLE, cues for soft bend to avoid recurvatum.               PT Education - 09/10/20 1321  Education Details Banker) Educated Patient    Methods Explanation;Demonstration;Handout    Comprehension Verbalized understanding;Returned demonstration              PT Short Term Goals - 08/20/20 1651       PT SHORT TERM GOAL #1   Title Pt will be independent with initial HEP    Time 4    Period Weeks    Status New    Target Date 09/17/20      PT SHORT TERM GOAL #2   Title Pt will be able to demo at least 3/5 R ankle DF to play on his drums    Time 4    Period Weeks    Status New    Target Date 09/17/20      PT SHORT TERM GOAL #3   Title Pt will be able to walk >500' with LRAD and SBA for improved home/community mobility    Time 4    Period Weeks    Status New    Target Date 09/17/20      PT SHORT TERM GOAL #4   Title Pt will be able to ascend/descend at least 20 steps with railing and SBA for safe return home    Baseline Performed 4 steps with bilat rails and min A    Time 4    Period Weeks    Status New    Target Date 09/17/20               PT Long Term Goals - 08/20/20 1703       PT LONG TERM GOAL #1   Title Pt will be independent with advanced HEP for strengthening and balance    Time 8    Period Weeks    Status New    Target Date 10/15/20      PT LONG TERM GOAL #2   Title Pt will have improved Berg Balance Score of at least 45/56 to indicate decreased fall risk     Baseline 39/56    Time 8    Period Weeks    Status New    Target Date 10/15/20      PT LONG TERM GOAL #3   Title Pt will have improved DGI score to at least 19/24 to demo decreased fall risk    Baseline 17/24    Time 8    Period Weeks    Status New    Target Date 10/15/20      PT LONG TERM GOAL #4   Title Pt will be able to amb at least 1000' with LRAD mod I on level and unlevel surfaces for community amb    Baseline Needs use of RW    Time 8    Period Weeks    Status New    Target Date 10/15/20      PT LONG TERM GOAL #5   Title Pt will be able to use R foot/ankle for drumming to return to leisure activities    Time 8    Period Weeks    Status New    Target Date 10/15/20                   Plan - 09/10/20 1322     Clinical Impression Statement Review standing gastroc stretch today and provided handout as addition to HEP. Continued activites to promote improved R quad strengthening, and NMR promoting improved weight shift onto RLE and stance. Intermittent rest  breaks required due to fatigue. Will continue to progress toward all LTGs    Personal Factors and Comorbidities Age;Fitness;Time since onset of injury/illness/exacerbation;Comorbidity 1;Past/Current Experience    Comorbidities L Achilles tendon tear, chronic R LE edema, CAD s/p CABG, HTN, asthma, type 2 diabetes, CKD stage III, HLD, remote R retinal detachment, HTN, CVA.    Examination-Activity Limitations Squat;Stairs;Caring for Others;Locomotion Level    Examination-Participation Restrictions Cleaning;Community Activity;Driving;Occupation;Shop;Yard Work    Merchant navy officer Evolving/Moderate complexity    Rehab Potential Good    PT Frequency 2x / week    PT Duration 8 weeks    PT Treatment/Interventions ADLs/Self Care Home Management;Aquatic Therapy;Cryotherapy;Electrical Stimulation;Moist Heat;DME Instruction;Gait training;Stair training;Functional mobility training;Therapeutic  activities;Therapeutic exercise;Balance training;Neuromuscular re-education;Manual techniques;Patient/family education;Passive range of motion;Taping;Joint Manipulations    PT Next Visit Plan Continue to progress R LE strengthening (and working on active DF/PF) Work on R LE coordination/control. might need to try again a heel wedge in the future if continued recurvatum. work towards stairs.    PT Home Exercise Plan Access Code: [email or web address]    Consulted and Agree with Plan of Care Patient;Family member/caregiver    Family Member Consulted Wife             Patient will benefit from skilled therapeutic intervention in order to improve the following deficits and impairments:  Abnormal gait, Decreased range of motion, Difficulty walking, Increased fascial restricitons, Impaired tone, Decreased activity tolerance, Decreased balance, Decreased mobility, Decreased strength, Increased edema  Visit Diagnosis: Muscle weakness (generalized)  Unsteadiness on feet  Other abnormalities of gait and mobility     Problem List Patient Active Problem List   Diagnosis Date Noted   Left pontine stroke ([place]) 07/25/2020   Acute right hemiparesis ([place]) 07/25/2020   Essential hypertension    History of CVA (cerebrovascular accident)    CKD (chronic kidney disease), stage II    Diabetes mellitus type 2 in obese ([facility])    Dyslipidemia    CVA (cerebral vascular accident) ([place]) 07/21/2020   Cryptogenic stroke ([place]) 06/18/2020   Acute-on-chronic kidney injury ([place]) 05/28/2020   Stroke ([place]) 05/27/2020   Elevated troponin 01/13/2019   COVID-19 virus infection 01/13/2019   S/P CABG (coronary artery bypass graft)    CAD (coronary artery disease) 04/28/2011   Insulin dependent type 2 diabetes mellitus, controlled ([place])    Hyperlipidemia    Hypertension    Obesity (BMI 30-39.9)    Nephrolithiasis, uric acid     Jones Bales 09/10/2020, 1:23 PM  [facility] [address] [place], [place], [address] Phone: [phone]   Fax:  [phone]  Name: ROGAN WIGLEY MRN: [id number] Date of Birth: 23-Jun-1950

## 2020-09-11 NOTE — Progress Notes (Signed)
Cls

## 2020-09-12 ENCOUNTER — Ambulatory Visit: Payer: PRIVATE HEALTH INSURANCE

## 2020-09-12 ENCOUNTER — Other Ambulatory Visit: Payer: Self-pay

## 2020-09-12 ENCOUNTER — Ambulatory Visit: Payer: Medicare Other | Admitting: Occupational Therapy

## 2020-09-12 ENCOUNTER — Ambulatory Visit: Payer: Medicare Other

## 2020-09-12 DIAGNOSIS — R2681 Unsteadiness on feet: Secondary | ICD-10-CM

## 2020-09-12 DIAGNOSIS — M6281 Muscle weakness (generalized): Secondary | ICD-10-CM | POA: Diagnosis not present

## 2020-09-12 DIAGNOSIS — R41842 Visuospatial deficit: Secondary | ICD-10-CM | POA: Diagnosis not present

## 2020-09-12 DIAGNOSIS — R2689 Other abnormalities of gait and mobility: Secondary | ICD-10-CM | POA: Diagnosis not present

## 2020-09-12 DIAGNOSIS — G8191 Hemiplegia, unspecified affecting right dominant side: Secondary | ICD-10-CM | POA: Diagnosis not present

## 2020-09-12 DIAGNOSIS — R278 Other lack of coordination: Secondary | ICD-10-CM | POA: Diagnosis not present

## 2020-09-12 NOTE — Therapy (Signed)
[facility] [address] [place], [place], [address] Phone: [phone]   Fax:  [phone]  Physical Therapy Treatment  Patient Details  Name: Mitchell Lambert MRN: [id number] Date of Birth: January 07, 1951 Referring Provider (PT): Garvin Fila, MD   Encounter Date: 09/12/2020   PT End of Session - 09/12/20 1621     Visit Number 6    Number of Visits 17    Date for PT Re-Evaluation 10/15/20    Authorization Type Medicare - will need 10th visit PN    Progress Note Due on Visit 10    PT Start Time 1617    PT Stop Time 1659    PT Time Calculation (min) 42 min    Equipment Utilized During Treatment Gait belt    Activity Tolerance Patient tolerated treatment well    Behavior During Therapy WFL for tasks assessed/performed             Past Medical History:  Diagnosis Date   Angina    NO ANGINA SINCE CABG   Arthritis    right ankle   Chronic daily headache    "@ least every other day""improved since heart surgery"   Coronary artery disease    PT [place] CABG SURGERY - DR. TILLEY IS HIS CARDIOLOGIST   Diabetes mellitus    Lantus x 10 yrs   Gout    History of kidney stones 09-08-12   multiple times with some lithotripsies   Hyperlipemia    Hypertension     Past Surgical History:  Procedure Laterality Date   BACK SURGERY     microsurgery: spinal stenosis -lumbar   bone spur  1990's   right great toe; "probably related to gout"   BUBBLE STUDY  06/17/2020   Procedure: BUBBLE STUDY;  Surgeon: Lelon Perla, MD;  Location: [facility];  Service: Cardiovascular;;   CARDIAC CATHETERIZATION  04/27/11   CATARACT EXTRACTION W/ INTRAOCULAR LENS IMPLANT  1990's   right; "lens was replaced twice over 3 months; lens is actually over iris"   CORONARY ARTERY BYPASS GRAFT  04/30/2011   Procedure: CORONARY ARTERY BYPASS GRAFTING (CABG);  Surgeon: Melrose Nakayama, MD;  Location: [facility];   Service: Open Heart Surgery;  Laterality: N/A;,x4 vessels   CYSTOSCOPY W/ URETERAL STENT PLACEMENT Right 08/19/2012   Procedure: CYSTOSCOPY WITH RETROGRADE PYELOGRAM/URETERAL STENT PLACEMENT;  Surgeon: Molli Hazard, MD;  Location: [facility];  Service: Urology;  Laterality: Right;   CYSTOSCOPY WITH RETROGRADE PYELOGRAM, URETEROSCOPY AND STENT PLACEMENT Left 07/17/2013   Procedure: CYSTOSCOPY, LEFT URETEROSCOPY, BASKET EXTRACTION OF STONE, INSERTION OF LEFT URETERAL STENT, ;  Surgeon: Jorja Loa, MD;  Location: [facility];  Service: Urology;  Laterality: Left;   CYSTOSCOPY/RETROGRADE/URETEROSCOPY Right 09/12/2012   Procedure: CYSTOSCOPY/RETROGRADE/URETEROSCOPY;  Surgeon: Franchot Gallo, MD;  Location: [facility];  Service: Urology;  Laterality: Right;   HOLMIUM LASER APPLICATION Right 6/96/2952   Procedure: HOLMIUM LASER APPLICATION;  Surgeon: Franchot Gallo, MD;  Location: [facility];  Service: Urology;  Laterality: Right;   HOLMIUM LASER APPLICATION Left 8/41/3244   Procedure: HOLMIUM LASER APPLICATION;  Surgeon: Jorja Loa, MD;  Location: [facility];  Service: Urology;  Laterality: Left;   KIDNEY STONE SURGERY     "probably 3 times"   LEFT HEART CATHETERIZATION WITH CORONARY ANGIOGRAM N/A 04/28/2011   Procedure: LEFT HEART CATHETERIZATION WITH CORONARY ANGIOGRAM;  Surgeon: Jacolyn Reedy, MD;  Location: [facility];  Service: Cardiovascular;  Laterality: N/A;  LITHOTRIPSY     "many; probably 5 times"   RADIAL ARTERY HARVEST  04/30/2011   Procedure: RADIAL ARTERY HARVEST;  Surgeon: Melrose Nakayama, MD;  Location: [facility];  Service: Open Heart Surgery;  Laterality: Left;   RETINAL DETACHMENT SURGERY  1990's   right eye; "made me have an early cataract"   TEE WITHOUT CARDIOVERSION N/A 06/17/2020   Procedure: TRANSESOPHAGEAL ECHOCARDIOGRAM (TEE);  Surgeon: Lelon Perla, MD;  Location: [facility] ENDOSCOPY;  Service: Cardiovascular;  Laterality: N/A;   VASECTOMY      There were no  vitals filed for this visit.   Subjective Assessment - 09/12/20 1621     Subjective Patient reports that the shoulder pain has continued to keep him up at night, but OT is addressing. Will participate in Aquatics on monday.    Patient is accompained by: Family member    Pertinent History achilles tendon tear on L    Limitations Standing;Sitting;House hold activities    How long can you stand comfortably? No issues    How long can you walk comfortably? In house only    Patient Stated Goals Return to their own home, play drums again    Currently in Pain? Yes    Pain Score 5     Pain Location Arm    Pain Orientation Right    Pain Descriptors / Indicators Aching    Pain Type Acute pain               [facility] Adult PT Treatment/Exercise - 09/12/20 0001       Transfers   Transfers Sit to Stand;Stand to Sit    Sit to Stand 5: Supervision    Stand to Sit 5: Supervision      Ambulation/Gait   Ambulation/Gait Yes    Ambulation/Gait Assistance 5: Supervision    Ambulation/Gait Assistance Details Completed ambulation with RW x 345 ft with patient ambulating at supervision level. Cues to keep soft knee and avoid recurvatum. Trialed SPC with quad tip, x 345 ft with PT providing initial cues for sequencing with new AD patient able to demo proper sequencing with minimal cues thereafter. No significant increase in recurvatum noted with reduced support from AD. After all other activites, completed ambulation gait training x 230 ft to see effect of fatigue on recruvatum with use of SPC, intermittent recurvatum with fatigue. Will continue to assess need for heel wedge at future sessions.    Ambulation Distance (Feet) 345 Feet   x 1, 345 x 1, 230 x 1   Assistive device Rolling walker    Gait Pattern Step-through pattern;Decreased stance time - right;Decreased dorsiflexion - right;Decreased weight shift to right;Right foot flat    Ambulation Surface Level;Indoor      Exercises   Exercises Knee/Hip       Knee/Hip Exercises: Standing   Forward Step Up Both;1 set;15 reps;Hand Hold: 2;Step Height: 6"    Forward Step Up Limitations completed alternating steps up to 1st step at stairs, alternating leading with BLE. BUE support. Rest break required after completion.               Balance Exercises - 09/12/20 0001       Balance Exercises: Standing   Standing Eyes Opened Wide (BOA);Head turns;Foam/compliant surface;3 reps;30 secs;Limitations    Standing Eyes Opened Limitations completed eyes open 2 x 30 seconds; then progressed to addition of horizontal/vertical head turns x 10 reps. increased challenge with vertical > horizontal.    Other Standing Exercises completed  standing weight shift with BUE support in // bars, completed x 10 reps wth alternating foot position, increased challenge with RLE posterior and sequencing. tactile cues and demo required for proper completion.                 PT Short Term Goals - 08/20/20 1651       PT SHORT TERM GOAL #1   Title Pt will be independent with initial HEP    Time 4    Period Weeks    Status New    Target Date 09/17/20      PT SHORT TERM GOAL #2   Title Pt will be able to demo at least 3/5 R ankle DF to play on his drums    Time 4    Period Weeks    Status New    Target Date 09/17/20      PT SHORT TERM GOAL #3   Title Pt will be able to walk >500' with LRAD and SBA for improved home/community mobility    Time 4    Period Weeks    Status New    Target Date 09/17/20      PT SHORT TERM GOAL #4   Title Pt will be able to ascend/descend at least 20 steps with railing and SBA for safe return home    Baseline Performed 4 steps with bilat rails and min A    Time 4    Period Weeks    Status New    Target Date 09/17/20               PT Long Term Goals - 08/20/20 1703       PT LONG TERM GOAL #1   Title Pt will be independent with advanced HEP for strengthening and balance    Time 8    Period Weeks    Status New     Target Date 10/15/20      PT LONG TERM GOAL #2   Title Pt will have improved Berg Balance Score of at least 45/56 to indicate decreased fall risk    Baseline 39/56    Time 8    Period Weeks    Status New    Target Date 10/15/20      PT LONG TERM GOAL #3   Title Pt will have improved DGI score to at least 19/24 to demo decreased fall risk    Baseline 17/24    Time 8    Period Weeks    Status New    Target Date 10/15/20      PT LONG TERM GOAL #4   Title Pt will be able to amb at least 1000' with LRAD mod I on level and unlevel surfaces for community amb    Baseline Needs use of RW    Time 8    Period Weeks    Status New    Target Date 10/15/20      PT LONG TERM GOAL #5   Title Pt will be able to use R foot/ankle for drumming to return to leisure activities    Time 8    Period Weeks    Status New    Target Date 10/15/20                   Plan - 09/12/20 1702     Clinical Impression Statement Continued gait training with RW initially, then progressed to [facility] with quad tip. Completed extenstive ambulation with this device with  patient doing well. Mild intermittent recurvatum noted with use at end of session most likely due to fatigue. Continued functional strengthening and balance activities with patient tolerating well with intemrittent rest breaks. Will continue to progress toward all goals.    Personal Factors and Comorbidities Age;Fitness;Time since onset of injury/illness/exacerbation;Comorbidity 1;Past/Current Experience    Comorbidities L Achilles tendon tear, chronic R LE edema, CAD s/p CABG, HTN, asthma, type 2 diabetes, CKD stage III, HLD, remote R retinal detachment, HTN, CVA.    Examination-Activity Limitations Squat;Stairs;Caring for Others;Locomotion Level    Examination-Participation Restrictions Cleaning;Community Activity;Driving;Occupation;Shop;Yard Work    [employer] Evolving/Moderate complexity    Rehab Potential Good    PT  Frequency 2x / week    PT Duration 8 weeks    PT Treatment/Interventions ADLs/Self Care Home Management;Aquatic Therapy;Cryotherapy;Electrical Stimulation;Moist Heat;DME Instruction;Gait training;Stair training;Functional mobility training;Therapeutic activities;Therapeutic exercise;Balance training;Neuromuscular re-education;Manual techniques;Patient/family education;Passive range of motion;Taping;Joint Manipulations    PT Next Visit Plan Continue gait training with SPC with quad tip. Check STGsContinue to progress R LE strengthening (and working on active DF/PF) Work on R LE coordination/control. might need to try again a heel wedge in the future if continued recurvatum. work towards stairs.    PT Home Exercise Plan Access Code: [email or web address]    Consulted and Agree with Plan of Care Patient;Family member/caregiver    Family Member Consulted Wife             Patient will benefit from skilled therapeutic intervention in order to improve the following deficits and impairments:  Abnormal gait, Decreased range of motion, Difficulty walking, Increased fascial restricitons, Impaired tone, Decreased activity tolerance, Decreased balance, Decreased mobility, Decreased strength, Increased edema  Visit Diagnosis: Other abnormalities of gait and mobility  Unsteadiness on feet  Muscle weakness (generalized)     Problem List Patient Active Problem List   Diagnosis Date Noted   Left pontine stroke ([place]) 07/25/2020   Acute right hemiparesis ([place]) 07/25/2020   Essential hypertension    History of CVA (cerebrovascular accident)    CKD (chronic kidney disease), stage II    Diabetes mellitus type 2 in obese (H[facility])    Dyslipidemia    CVA (cerebral vascular accident) ([place]) 07/21/2020   Cryptogenic stroke ([place]) 06/18/2020   Acute-on-chronic kidney injury ([place]) 05/28/2020   Stroke ([facility]) 05/27/2020   Elevated troponin 01/13/2019   COVID-19 virus infection 01/13/2019   S/P CABG (coronary artery bypass  graft)    CAD (coronary artery disease) 04/28/2011   Insulin dependent type 2 diabetes mellitus, controlled ([place])    Hyperlipidemia    Hypertension    Obesity (BMI 30-39.9)    Nephrolithiasis, uric acid     Jones Bales, PT, DPT 09/12/2020, 5:06 PM  [facility] [address] [place], [place], [address] Phone: [phone]   Fax:  [phone]  Name: Mitchell Lambert MRN: [id number] Date of Birth: Jan 17, 1951

## 2020-09-12 NOTE — Patient Instructions (Signed)
   Aquatic Therapy: What to Expect!  Where:  [facility] [address] [place], [place] [address] [phone]           How to Prepare: Please make sure you drink 8 ounces of water about one hour prior to your pool session A caregiver must attend the entire session with the patient (unless your primary therapists feels this is not necessary). The caregiver will be responsible for assisting with dressing as well as any toileting needs.  Please arrive IN YOUR SUIT and a few minutes prior to your appointment - this helps to avoid delays in starting your session. Please make sure to attend to any toileting needs prior to entering the pool Once on the pool deck your therapist will ask you to sign the Patient  Consent and Assignment of Benefits form Your therapist may take your blood pressure prior to, during and after your session if indicated We usually try and create a home exercise program based on activities we do in the pool.  Please be thinking about who might be able to assist you in the pool should you want to participate in an aquatic home exercise program at the time of discharge.  Some patients do not want to or do not have the ability to participate in an aquatic home program - this is not a barrier in any way to you participating in aquatic therapy as part of your current therapy plan!    About the pool: Entering the pool Your therapist will assist you; there are multiple ways to enter including stairs with railings, a walk in ramp, a roll in chair and a mechanical lift. Your therapist will determine the most appropriate way for you. Water temperature is usually between 86-87 degrees There may be other swimmers in the pool at the same time     Contact Info:             Appointments: [facility]         All sessions are 45 minutes   [place] [address]            Please call the [facility]  if   [place], [place]  [address]           you need to cancel or reschedule an appointment.  [place]         Antony Salmon

## 2020-09-12 NOTE — Therapy (Signed)
[facility] [address] [place], [place], [address] Phone: [phone]   Fax:  [phone]  Occupational Therapy Treatment  Patient Details  Name: Mitchell Lambert MRN: [id number] Date of Birth: 1950-04-17 Referring Provider (OT): Dr. Leonie Man   Encounter Date: 09/12/2020   OT End of Session - 09/12/20 1723     Visit Number 6    Number of Visits 24    Date for OT Re-Evaluation 11/12/20    Authorization Type Medicare    Authorization - Visit Number 6    Authorization - Number of Visits 10    OT Start Time 3419    OT Stop Time 1615    OT Time Calculation (min) 42 min             Past Medical History:  Diagnosis Date   Angina    NO ANGINA SINCE CABG   Arthritis    right ankle   Chronic daily headache    "@ least every other day""improved since heart surgery"   Coronary artery disease    PT [place] CABG SURGERY - DR. TILLEY IS HIS CARDIOLOGIST   Diabetes mellitus    Lantus x 10 yrs   Gout    History of kidney stones 09-08-12   multiple times with some lithotripsies   Hyperlipemia    Hypertension     Past Surgical History:  Procedure Laterality Date   BACK SURGERY     microsurgery: spinal stenosis -lumbar   bone spur  1990's   right great toe; "probably related to gout"   BUBBLE STUDY  06/17/2020   Procedure: BUBBLE STUDY;  Surgeon: Lelon Perla, MD;  Location: [facility];  Service: Cardiovascular;;   CARDIAC CATHETERIZATION  04/27/11   CATARACT EXTRACTION W/ INTRAOCULAR LENS IMPLANT  1990's   right; "lens was replaced twice over 3 months; lens is actually over iris"   CORONARY ARTERY BYPASS GRAFT  04/30/2011   Procedure: CORONARY ARTERY BYPASS GRAFTING (CABG);  Surgeon: Melrose Nakayama, MD;  Location: [facility];  Service: Open Heart Surgery;  Laterality: N/A;,x4 vessels   CYSTOSCOPY W/ URETERAL STENT PLACEMENT Right 08/19/2012   Procedure: CYSTOSCOPY WITH RETROGRADE  PYELOGRAM/URETERAL STENT PLACEMENT;  Surgeon: Molli Hazard, MD;  Location: [facility];  Service: Urology;  Laterality: Right;   CYSTOSCOPY WITH RETROGRADE PYELOGRAM, URETEROSCOPY AND STENT PLACEMENT Left 07/17/2013   Procedure: CYSTOSCOPY, LEFT URETEROSCOPY, BASKET EXTRACTION OF STONE, INSERTION OF LEFT URETERAL STENT, ;  Surgeon: Jorja Loa, MD;  Location[facility];  Service: Urology;  Laterality: Left;   CYSTOSCOPY/RETROGRADE/URETEROSCOPY Right 09/12/2012   Procedure: CYSTOSCOPY/RETROGRADE/URETEROSCOPY;  Surgeon: Franchot Gallo, MD;  [facility];  Service: Urology;  Laterality: Right;   HOLMIUM LASER APPLICATION Right 08/21/2977   Procedure: HOLMIUM LASER APPLICATION;  Surgeon: Franchot Gallo, MD;  [facility];  Service: Urology;  Laterality: Right;   HOLMIUM LASER APPLICATION Left 8/92/1194   Procedure: HOLMIUM LASER APPLICATION;  Surgeon: Jorja Loa, MD;  [facility];  Service: Urology;  Laterality: Left;   KIDNEY STONE SURGERY     "probably 3 times"   LEFT HEART CATHETERIZATION WITH CORONARY ANGIOGRAM N/A 04/28/2011   Procedure: LEFT HEART CATHETERIZATION WITH CORONARY ANGIOGRAM;  Surgeon: Jacolyn Reedy, MD;  Location: [facility] C[facility];  Service: Cardiovascular;  Laterality: N/A;   LITHOTRIPSY     "many; probably 5 times"   RADIAL ARTERY HARVEST  04/30/2011   Procedure: RADIAL ARTERY HARVEST;  Surgeon: Revonda Standard  Roxan Hockey, MD;  Location: [facility];  Service: Open Heart Surgery;  Laterality: Left;   RETINAL DETACHMENT SURGERY  1990's   right eye; "made me have an early cataract"   TEE WITHOUT CARDIOVERSION N/A 06/17/2020   Procedure: TRANSESOPHAGEAL ECHOCARDIOGRAM (TEE);  Surgeon: Lelon Perla, MD;  Location: [facility]OSCOPY;  Service: Cardiovascular;  Laterality: N/A;   VASECTOMY      There were no vitals filed for this visit.   Subjective Assessment - 09/12/20 1722     Subjective  Pt reports shoulder pain    Pertinent History 70 y/o male presented  to  ED for concern of dizziness and diplopia on 07/21/20. MRI showed acute L pontine CVA and prior L parietal infract.  PMH: CAD s/p CABG, HTN, asthma, type 2 diabetes, CKD stage III, HLD, remote R retinal detachment, HTN, CVA.    Currently in Pain? Yes    Pain Score 5     Pain Location Arm    Pain Orientation Right    Pain Descriptors / Indicators Aching    Pain Type Acute pain    Pain Onset 1 to 4 weeks ago    Pain Frequency Intermittent    Aggravating Factors  malpositioning    Pain Relieving Factors repostioning                  Treatment: supine closed chain shoulder flexion with therapist facilitating shoulder positioning then seated low range shoulder flexion with min faciltiation/ v.c at shoulder/ scapula. Education regarding bed positioning to minimize shoulder pain, pt returned demonstration. Weightbearing though bilateral UE's edge of mat with scapular retraction. Attempted weightbearing through elbow however pt experienced pain so it was discontinued. Pt was educated regarding aquatics therapy and handout provided. Seated fine motor coordination task to place small pegs, min v.c for normal movement patterns.                 OT Short Term Goals - 09/12/20 1601       OT SHORT TERM GOAL #1   Title I with HEP for vision, coordination    Time 4    Period Weeks    Status On-going    Target Date 09/17/20      OT SHORT TERM GOAL #2   Title Pt will demonstrate improved RUE fine motor coordination for ADLS as evidenced by decreasing 9 hole peg test score to 52 secs or less    Time 4    Period Weeks    Status On-going      OT SHORT TERM GOAL #3   Title Pt will report that he is cutting food independently and  feeding himself with RUE at least 75% of the time.    Time 4    Period Weeks    Status On-going      OT SHORT TERM GOAL #4   Title Pt will perfrom all basic ADLS with distant supervision.    Time 4    Period Weeks    Status On-going      OT  SHORT TERM GOAL #5   Title Pt will demonstrate ability to retrieve a lightweight object at 110* shoulder flexion with good control and min compensation.    Time 4    Period Weeks    Status On-going      OT SHORT TERM GOAL #6   Title Pt will increase RUE grip strength to 45 lbs or greater for increased functional use.    Time 4  Period Weeks    Status Achieved   56.4 lbs     OT SHORT TERM GOAL #7   Title Pt will verbalzie understanding of compensatroy strategies for visual deficits    Time 4    Period Weeks    Status On-going               OT Long Term Goals - 09/10/20 1521       OT LONG TERM GOAL #1   Title I with all basic ADLS.    Time 12    Period Weeks    Status On-going      OT LONG TERM GOAL #2   Title Pt will demonstrate improved fine motor coordination for ADLs as evidenced by performing 9 hole peg test in 45 secs or less.    Time 12    Period Weeks    Status On-going      OT LONG TERM GOAL #3   Title Pt will perform simple snack/ beverage prep modified Independently.    Time 12    Period Weeks    Status On-going      OT LONG TERM GOAL #4   Title Pt will retrieve a 3 lbs object with RUE at 115 shoulder flexion with good control.    Time 12    Period Weeks    Status On-going      OT LONG TERM GOAL #5   Title Pt will perfrom simulated work activities modified Independently    Time 12    Period Weeks    Status On-going      OT LONG TERM GOAL #6   Title Pt will perfrom tabletop and environmental scanning with 90% or better accuracy in prep for work activities.    Time 12    Period Weeks    Status On-going                   Plan - 09/12/20 1609     Clinical Impression Statement Pt requires cueing for normal movement patterns. Pt is progressing overall .    OT Occupational Profile and History Detailed Assessment- Review of Records and additional review of physical, cognitive, psychosocial history related to current functional  performance    Occupational performance deficits (Please refer to evaluation for details): ADL's;IADL's;Play;Work;Leisure;Social Participation    Body Structure / Function / Physical Skills ADL;Balance;Endurance;Mobility;Strength;Flexibility;UE functional use;FMC;Coordination;Gait;Vision;ROM;GMC;Decreased knowledge of precautions;Decreased knowledge of use of DME;Dexterity;IADL    Rehab Potential Good    OT Frequency 2x / week    OT Duration 12 weeks    OT Treatment/Interventions Self-care/ADL training;Therapeutic exercise;Functional Mobility Training;Balance training;Ultrasound;Neuromuscular education;Manual Therapy;Therapeutic activities;Paraffin;Cryotherapy;DME and/or AE instruction;Cognitive remediation/compensation;Visual/perceptual remediation/compensation;Fluidtherapy;Moist Heat;Electrical Stimulation;Passive range of motion;Patient/family education;Energy conservation;Aquatic Therapy;Gait Training    Plan aquatic therapy, NMR, address shoulder pain and normal movement    Consulted and Agree with Plan of Care Patient             Patient will benefit from skilled therapeutic intervention in order to improve the following deficits and impairments:   Body Structure / Function / Physical Skills: ADL, Balance, Endurance, Mobility, Strength, Flexibility, UE functional use, FMC, Coordination, Gait, Vision, ROM, GMC, Decreased knowledge of precautions, Decreased knowledge of use of DME, Dexterity, IADL       Visit Diagnosis: Visuospatial deficit  Other lack of coordination  Unsteadiness on feet  Muscle weakness (generalized)    Problem List Patient Active Problem List   Diagnosis Date Noted   Left pontine stroke ([place]) 07/25/2020  Acute right hemiparesis ([place]) 07/25/2020   Essential hypertension    History of CVA (cerebrovascular accident)    CKD (chronic kidney disease), stage II    Diabetes mellitus type 2 in obese (HCC)    Dyslipidemia    CVA (cerebral vascular accident)  ([facility]) 07/21/2020   Cryptogenic stroke ([place]) 06/18/2020   Acute-on-chronic kidney injury ([place]) 05/28/2020   Stroke ([place]) 05/27/2020   Elevated troponin 01/13/2019   COVID-19 virus infection 01/13/2019   S/P CABG (coronary artery bypass graft)    CAD (coronary artery disease) 04/28/2011   Insulin dependent type 2 diabetes mellitus, controlled ([place])    Hyperlipidemia    Hypertension    Obesity (BMI 30-39.9)    Nephrolithiasis, uric acid     Lisset Ketchem 09/12/2020, 5:25 PM  [facility] [address] [place], [place], [address] Phone: [phone]   Fax:  [phone]  Name: Mitchell Lambert MRN: [id number] Date of Birth: 02/26/1951

## 2020-09-16 ENCOUNTER — Ambulatory Visit: Payer: Medicare Other

## 2020-09-16 ENCOUNTER — Ambulatory Visit: Payer: PRIVATE HEALTH INSURANCE | Admitting: Occupational Therapy

## 2020-09-16 ENCOUNTER — Ambulatory Visit: Payer: Medicare Other | Admitting: Occupational Therapy

## 2020-09-16 DIAGNOSIS — G8191 Hemiplegia, unspecified affecting right dominant side: Secondary | ICD-10-CM | POA: Diagnosis not present

## 2020-09-16 DIAGNOSIS — R278 Other lack of coordination: Secondary | ICD-10-CM | POA: Diagnosis not present

## 2020-09-16 DIAGNOSIS — M6281 Muscle weakness (generalized): Secondary | ICD-10-CM | POA: Diagnosis not present

## 2020-09-16 DIAGNOSIS — R2689 Other abnormalities of gait and mobility: Secondary | ICD-10-CM | POA: Diagnosis not present

## 2020-09-16 DIAGNOSIS — R41842 Visuospatial deficit: Secondary | ICD-10-CM | POA: Diagnosis not present

## 2020-09-16 DIAGNOSIS — R2681 Unsteadiness on feet: Secondary | ICD-10-CM | POA: Diagnosis not present

## 2020-09-17 ENCOUNTER — Other Ambulatory Visit: Payer: Self-pay

## 2020-09-17 ENCOUNTER — Ambulatory Visit: Payer: Medicare Other

## 2020-09-17 ENCOUNTER — Encounter: Payer: Self-pay | Admitting: Occupational Therapy

## 2020-09-17 DIAGNOSIS — G8191 Hemiplegia, unspecified affecting right dominant side: Secondary | ICD-10-CM | POA: Diagnosis not present

## 2020-09-17 DIAGNOSIS — M6281 Muscle weakness (generalized): Secondary | ICD-10-CM

## 2020-09-17 DIAGNOSIS — R278 Other lack of coordination: Secondary | ICD-10-CM | POA: Diagnosis not present

## 2020-09-17 DIAGNOSIS — R2681 Unsteadiness on feet: Secondary | ICD-10-CM

## 2020-09-17 DIAGNOSIS — R2689 Other abnormalities of gait and mobility: Secondary | ICD-10-CM | POA: Diagnosis not present

## 2020-09-17 DIAGNOSIS — R41842 Visuospatial deficit: Secondary | ICD-10-CM | POA: Diagnosis not present

## 2020-09-17 NOTE — Patient Instructions (Signed)
Walking Program: Walk with your wife. Walk during cooler times of the day. Starting next week, Walk 6 min per day for 7 days. Following week add 2 minutes to your total time. Week 1 6 min Week 2: 8 min Week 3: 10 min Week 4: 12 min...Marland KitchenMarland KitchenUntil you can walk to 30 min per day

## 2020-09-17 NOTE — Therapy (Signed)
[facility] [address] [place], [place], [address] Phone: [phone]   Fax:  [phone]  Physical Therapy Treatment  Patient Details  Name: Mitchell Lambert MRN: [id number] Date of Birth: 02-20-1951 Referring Provider (PT): Garvin Fila, MD   Encounter Date: 09/17/2020   PT End of Session - 09/17/20 1150     Visit Number 7    Number of Visits 17    Date for PT Re-Evaluation 10/15/20    Authorization Type Medicare - will need 10th visit PN    Progress Note Due on Visit 10    PT Start Time 1145    PT Stop Time 1230    PT Time Calculation (min) 45 min    Equipment Utilized During Treatment Gait belt    Activity Tolerance Patient tolerated treatment well    Behavior During Therapy WFL for tasks assessed/performed             Past Medical History:  Diagnosis Date   Angina    NO ANGINA SINCE CABG   Arthritis    right ankle   Chronic daily headache    "@ least every other day""improved since heart surgery"   Coronary artery disease    PT [place] CABG SURGERY - DR. TILLEY IS HIS CARDIOLOGIST   Diabetes mellitus    Lantus x 10 yrs   Gout    History of kidney stones 09-08-12   multiple times with some lithotripsies   Hyperlipemia    Hypertension     Past Surgical History:  Procedure Laterality Date   BACK SURGERY     microsurgery: spinal stenosis -lumbar   bone spur  1990's   right great toe; "probably related to gout"   BUBBLE STUDY  06/17/2020   Procedure: BUBBLE STUDY;  Surgeon: Lelon Perla, MD;  Location: [facility];  Service: Cardiovascular;;   CARDIAC CATHETERIZATION  04/27/11   CATARACT EXTRACTION W/ INTRAOCULAR LENS IMPLANT  1990's   right; "lens was replaced twice over 3 months; lens is actually over iris"   CORONARY ARTERY BYPASS GRAFT  04/30/2011   Procedure: CORONARY ARTERY BYPASS GRAFTING (CABG);  Surgeon: Melrose Nakayama, MD;  Location: [facility];   Service: Open Heart Surgery;  Laterality: N/A;,x4 vessels   CYSTOSCOPY W/ URETERAL STENT PLACEMENT Right 08/19/2012   Procedure: CYSTOSCOPY WITH RETROGRADE PYELOGRAM/URETERAL STENT PLACEMENT;  Surgeon: Molli Hazard, MD;  [facility];  Service: Urology;  Laterality: Right;   CYSTOSCOPY WITH RETROGRADE PYELOGRAM, URETEROSCOPY AND STENT PLACEMENT Left 07/17/2013   Procedure: CYSTOSCOPY, LEFT URETEROSCOPY, BASKET EXTRACTION OF STONE, INSERTION OF LEFT URETERAL STENT, ;  Surgeon: Jorja Loa, MD;  [facility];  Service: Urology;  Laterality: Left;   CYSTOSCOPY/RETROGRADE/URETEROSCOPY Right 09/12/2012   Procedure: CYSTOSCOPY/RETROGRADE/URETEROSCOPY;  Surgeon: Franchot Gallo, MD;  [facility];  Service: Urology;  Laterality: Right;   HOLMIUM LASER APPLICATION Right 2/54/9826   Procedure: HOLMIUM LASER APPLICATION;  Surgeon: Franchot Gallo, MD;  [facility];  Service: Urology;  Laterality: Right;   HOLMIUM LASER APPLICATION Left 06/15/8307   Procedure: HOLMIUM LASER APPLICATION;  Surgeon: Jorja Loa, MD;  [facility];  Service: Urology;  Laterality: Left;   KIDNEY STONE SURGERY     "probably 3 times"   LEFT HEART CATHETERIZATION WITH CORONARY ANGIOGRAM N/A 04/28/2011   Procedure: LEFT HEART CATHETERIZATION WITH CORONARY ANGIOGRAM;  Surgeon: Jacolyn Reedy, MD;  Location: [facility] CATH LAB;  Service: Cardiovascular;  Laterality: N/A;  LITHOTRIPSY     "many; probably 5 times"   RADIAL ARTERY HARVEST  04/30/2011   Procedure: RADIAL ARTERY HARVEST;  Surgeon: Melrose Nakayama, MD;  Location: [facility];  Service: Open Heart Surgery;  Laterality: Left;   RETINAL DETACHMENT SURGERY  1990's   right eye; "made me have an early cataract"   TEE WITHOUT CARDIOVERSION N/A 06/17/2020   Procedure: TRANSESOPHAGEAL ECHOCARDIOGRAM (TEE);  Surgeon: Lelon Perla, MD;  Location: [facility]OSCOPY;  Service: Cardiovascular;  Laterality: N/A;   VASECTOMY      There were no  vitals filed for this visit.   Subjective Assessment - 09/17/20 1151     Subjective Pt reports he had his first therapy in pool yesterday. He was sore for the day but felt good.    Patient is accompained by: Family member    Pertinent History achilles tendon tear on L    Limitations Standing;Sitting;House hold activities    How long can you stand comfortably? No issues    How long can you walk comfortably? In house only    Patient Stated Goals Return to their own home, play drums again              Goals assessed today Gait training: st. Cane with quad tip, 545' with CGA, 4x catching of R shoe on floor but able to recover I, cues to not put cane too far in front and keeping it laterally to side to prevent it from kicking it Walking program issued (patient instructions) Stairs: 12 steps with rail on L and cane in R UE; 8 steps with rail on R and cane in L UE, step to pattern and SBA Replaced tennis balls for patient.                          PT Short Term Goals - 09/17/20 1155       PT SHORT TERM GOAL #1   Title Pt will be independent with initial HEP    Baseline pt reports compliance (09/17/20)    Time 4    Period Weeks    Status Achieved    Target Date 09/17/20      PT SHORT TERM GOAL #2   Title Pt will be able to demo at least 3/5 R ankle DF to play on his drums    Baseline 3+/5 on R ankle DF (09/17/20)    Time 4    Period Weeks    Status Achieved    Target Date 09/17/20      PT SHORT TERM GOAL #3   Title Pt will be able to walk >500' with LRAD and SBA for improved home/community mobility    Baseline 545' with st. cane (quad tip) with CGA    Time 4    Period Weeks    Status On-going    Target Date 09/17/20      PT SHORT TERM GOAL #4   Title Pt will be able to ascend/descend at least 20 steps with railing and SBA for safe return home    Baseline Performed 4 steps with bilat rails and min A; performed 20 steps with one rail and st. cane on  contralateral side with SBA (09/17/20)    Time 4    Period Weeks    Status Achieved    Target Date 09/17/20               PT Long Term Goals - 08/20/20 1703  PT LONG TERM GOAL #1   Title Pt will be independent with advanced HEP for strengthening and balance    Time 8    Period Weeks    Status New    Target Date 10/15/20      PT LONG TERM GOAL #2   Title Pt will have improved Berg Balance Score of at least 45/56 to indicate decreased fall risk    Baseline 39/56    Time 8    Period Weeks    Status New    Target Date 10/15/20      PT LONG TERM GOAL #3   Title Pt will have improved DGI score to at least 19/24 to demo decreased fall risk    Baseline 17/24    Time 8    Period Weeks    Status New    Target Date 10/15/20      PT LONG TERM GOAL #4   Title Pt will be able to amb at least 1000' with LRAD mod I on level and unlevel surfaces for community amb    Baseline Needs use of RW    Time 8    Period Weeks    Status New    Target Date 10/15/20      PT LONG TERM GOAL #5   Title Pt will be able to use R foot/ankle for drumming to return to leisure activities    Time 8    Period Weeks    Status New    Target Date 10/15/20                   Plan - 09/17/20 1245     Clinical Impression Statement Patient met 3/4 STG. Patient still requires CGA with LRAD (St. cane) with ambulation due to occaisional catching of the toe and instability with gait. Patient was given formal walking program to gradually improve walking endurance over time.    Personal Factors and Comorbidities Age;Fitness;Time since onset of injury/illness/exacerbation;Comorbidity 1;Past/Current Experience    Comorbidities L Achilles tendon tear, chronic R LE edema, CAD s/p CABG, HTN, asthma, type 2 diabetes, CKD stage III, HLD, remote R retinal detachment, HTN, CVA.    Examination-Activity Limitations Squat;Stairs;Caring for Others;Locomotion Level    Examination-Participation Restrictions  Cleaning;Community Activity;Driving;Occupation;Shop;Yard Work    [employer] Evolving/Moderate complexity    Rehab Potential Good    PT Frequency 2x / week    PT Duration 8 weeks    PT Treatment/Interventions ADLs/Self Care Home Management;Aquatic Therapy;Cryotherapy;Electrical Stimulation;Moist Heat;DME Instruction;Gait training;Stair training;Functional mobility training;Therapeutic activities;Therapeutic exercise;Balance training;Neuromuscular re-education;Manual techniques;Patient/family education;Passive range of motion;Taping;Joint Manipulations    PT Next Visit Plan Continue gait training with SPC with quad tip. Check STGsContinue to progress R LE strengthening (and working on active DF/PF) Work on R LE coordination/control. might need to try again a heel wedge in the future if continued recurvatum. work towards stairs.    PT Home Exercise Plan Access Code: [email or web address]; Walking program (09/17/20)- pt instructions    Consulted and Agree with Plan of Care Patient;Family member/caregiver    Family Member Consulted Wife             Patient will benefit from skilled therapeutic intervention in order to improve the following deficits and impairments:  Abnormal gait, Decreased range of motion, Difficulty walking, Increased fascial restricitons, Impaired tone, Decreased activity tolerance, Decreased balance, Decreased mobility, Decreased strength, Increased edema  Visit Diagnosis: Unsteadiness on feet  Acute right hemiparesis (HCC)  Muscle weakness (generalized)  Other abnormalities  of gait and mobility     Problem List Patient Active Problem List   Diagnosis Date Noted   Left pontine stroke ([facility]) 07/25/2020   Acute right hemiparesis ([place]) 07/25/2020   Essential hypertension    History of CVA (cerebrovascular accident)    CKD (chronic kidney disease), stage II    Diabetes mellitus type 2 in obese (HCC)    Dyslipidemia    CVA (cerebral vascular accident)  ([place]) 07/21/2020   Cryptogenic stroke ([place]) 06/18/2020   Acute-on-chronic kidney injury ([place]) 05/28/2020   Stroke ([place]) 05/27/2020   Elevated troponin 01/13/2019   COVID-19 virus infection 01/13/2019   S/P CABG (coronary artery bypass graft)    CAD (coronary artery disease) 04/28/2011   Insulin dependent type 2 diabetes mellitus, controlled ([place])    Hyperlipidemia    Hypertension    Obesity (BMI 30-39.9)    Nephrolithiasis, uric acid     Kerrie Pleasure, PT 09/17/2020, 12:48 PM  [facility] [address] [place], [place], [address] Phone: [phone]   Fax:  [phone]  Name: Mitchell Lambert MRN: [id number] Date of Birth: 1950-08-09

## 2020-09-17 NOTE — Therapy (Signed)
[facility] [address] [place], [place], [address] Phone: [phone]   Fax:  [phone]  Occupational Therapy Treatment  Patient Details  Name: Mitchell Lambert MRN: [id number] Date of Birth: May 20, 1950 Referring Provider (OT): Dr. Leonie Man   Encounter Date: 09/16/2020   OT End of Session - 09/17/20 0731     Visit Number 7    Number of Visits 24    Date for OT Re-Evaluation 11/12/20    Authorization Type Medicare    Authorization - Visit Number 7    Authorization - Number of Visits 10    Progress Note Due on Visit 10    OT Start Time 5027    OT Stop Time 1800    OT Time Calculation (min) 45 min    Activity Tolerance Patient tolerated treatment well    Behavior During Therapy WFL for tasks assessed/performed             Past Medical History:  Diagnosis Date   Angina    NO ANGINA SINCE CABG   Arthritis    right ankle   Chronic daily headache    "@ least every other day""improved since heart surgery"   Coronary artery disease    PT [facility] CABG SURGERY - DR. TILLEY IS HIS CARDIOLOGIST   Diabetes mellitus    Lantus x 10 yrs   Gout    History of kidney stones 09-08-12   multiple times with some lithotripsies   Hyperlipemia    Hypertension     Past Surgical History:  Procedure Laterality Date   BACK SURGERY     microsurgery: spinal stenosis -lumbar   bone spur  1990's   right great toe; "probably related to gout"   BUBBLE STUDY  06/17/2020   Procedure: BUBBLE STUDY;  Surgeon: Lelon Perla, MD;  Location: [facility];  Service: Cardiovascular;;   CARDIAC CATHETERIZATION  04/27/11   CATARACT EXTRACTION W/ INTRAOCULAR LENS IMPLANT  1990's   right; "lens was replaced twice over 3 months; lens is actually over iris"   CORONARY ARTERY BYPASS GRAFT  04/30/2011   Procedure: CORONARY ARTERY BYPASS GRAFTING (CABG);  Surgeon: Melrose Nakayama, MD;  Location: [facility];  Service: Open  Heart Surgery;  Laterality: N/A;,x4 vessels   CYSTOSCOPY W/ URETERAL STENT PLACEMENT Right 08/19/2012   Procedure: CYSTOSCOPY WITH RETROGRADE PYELOGRAM/URETERAL STENT PLACEMENT;  Surgeon: Molli Hazard, MD;  Location: [facility];  Service: Urology;  Laterality: Right;   CYSTOSCOPY WITH RETROGRADE PYELOGRAM, URETEROSCOPY AND STENT PLACEMENT Left 07/17/2013   Procedure: CYSTOSCOPY, LEFT URETEROSCOPY, BASKET EXTRACTION OF STONE, INSERTION OF LEFT URETERAL STENT, ;  Surgeon: Jorja Loa, MD;  Location: [facility];  Service: Urology;  Laterality: Left;   CYSTOSCOPY/RETROGRADE/URETEROSCOPY Right 09/12/2012   Procedure: CYSTOSCOPY/RETROGRADE/URETEROSCOPY;  Surgeon: Franchot Gallo, MD;  Location: [facility];  Service: Urology;  Laterality: Right;   HOLMIUM LASER APPLICATION Right 7/41/2878   Procedure: HOLMIUM LASER APPLICATION;  Surgeon: Franchot Gallo, MD;  Location: [facility];  Service: Urology;  Laterality: Right;   HOLMIUM LASER APPLICATION Left 6/76/7209   Procedure: HOLMIUM LASER APPLICATION;  Surgeon: Jorja Loa, MD;  Location: [facility];  Service: Urology;  Laterality: Left;   KIDNEY STONE SURGERY     "probably 3 times"   LEFT HEART CATHETERIZATION WITH CORONARY ANGIOGRAM N/A 04/28/2011   Procedure: LEFT HEART CATHETERIZATION WITH CORONARY ANGIOGRAM;  Surgeon: Jacolyn Reedy, MD;  [facility] C[facility];  Service: Cardiovascular;  Laterality: N/A;  LITHOTRIPSY     "many; probably 5 times"   RADIAL ARTERY HARVEST  04/30/2011   Procedure: RADIAL ARTERY HARVEST;  Surgeon: Melrose Nakayama, MD;  Location: [facility];  Service: Open Heart Surgery;  Laterality: Left;   RETINAL DETACHMENT SURGERY  1990's   right eye; "made me have an early cataract"   TEE WITHOUT CARDIOVERSION N/A 06/17/2020   Procedure: TRANSESOPHAGEAL ECHOCARDIOGRAM (TEE);  Surgeon: Lelon Perla, MD;  Location: [facility] ENDOSCOPY;  Service: Cardiovascular;  Laterality: N/A;   VASECTOMY      There were no vitals filed for  this visit.   Subjective Assessment - 09/17/20 0727     Subjective  Patient reports being comfortable in water.  Planning to go to beach next week and wondered about water exercise program.    Pertinent History 70 y/o male presented to  ED for concern of dizziness and diplopia on 07/21/20. MRI showed acute L pontine CVA and prior L parietal infract.  PMH: CAD s/p CABG, HTN, asthma, type 2 diabetes, CKD stage III, HLD, remote R retinal detachment, HTN, CVA.    Patient Stated Goals regain use of RUE    Currently in Pain? Yes    Pain Score 4     Pain Location Arm    Pain Orientation Right    Pain Descriptors / Indicators Aching    Pain Type Acute pain    Pain Onset 1 to 4 weeks ago    Pain Frequency Intermittent    Aggravating Factors  movement, poor alignment with movement    Pain Relieving Factors rest, reposition             Patient seen for aquatic therapy visit.  Patient entered and exited pool via stairs mod assist.  Mod assist to walk to pool edge without AFO or walker.   Session occurred in 3.5-4.5 ft of water.   Patient referred for pool therapy to address Right arm pain.  Patient is developing synergistic movements in Right arm that appear more pronounced when up on feet.  Patient reports being comfortable on the water -and wanting to learn some exercises he could do while on vacation in the pool.   Started with walking in the water to address balance, stance time on BLE, weight shifting, and narrowing his base of support.  Added walking and use of warms to gently pull the water.   Worked against wall with allowing buoyancy assisted movement of BUE - "let your arms float to surface." Stressed with patient that he should only work in pain free ranges.  Worked on shoulder flexion, abduction, horizontal adduction.  Added small dumbbells to allow gentle resitance training - buoyancy resisted work with same movements, and also elbow extension/flexion.  Worked off the wall to challenge  balance.   Finished with supine with floatation equipment to address end range shoulder range of motion.  Patient with reduced pain at end of session, and able to repeat safe exercise to try in pool next week.  Wife present and looking to purchase water dumbbells.                       OT Education - 09/17/20 0731     Education Details provided patient with water balance, stretching and strengthening exercises    Person(s) Educated Patient;Spouse    Methods Explanation;Demonstration    Comprehension Verbalized understanding;Returned demonstration;Need further instruction              OT Short  Term Goals - 09/17/20 0733       OT SHORT TERM GOAL #1   Title I with HEP for vision, coordination    Time 4    Period Weeks    Status On-going    Target Date 09/17/20      OT SHORT TERM GOAL #2   Title Pt will demonstrate improved RUE fine motor coordination for ADLS as evidenced by decreasing 9 hole peg test score to 52 secs or less    Time 4    Period Weeks    Status On-going      OT SHORT TERM GOAL #3   Title Pt will report that he is cutting food independently and  feeding himself with RUE at least 75% of the time.    Time 4    Period Weeks    Status On-going      OT SHORT TERM GOAL #4   Title Pt will perfrom all basic ADLS with distant supervision.    Time 4    Period Weeks    Status On-going      OT SHORT TERM GOAL #5   Title Pt will demonstrate ability to retrieve a lightweight object at 110* shoulder flexion with good control and min compensation.    Time 4    Period Weeks    Status On-going      OT SHORT TERM GOAL #6   Title Pt will increase RUE grip strength to 45 lbs or greater for increased functional use.    Time 4    Period Weeks    Status Achieved   56.4 lbs     OT SHORT TERM GOAL #7   Title Pt will verbalzie understanding of compensatroy strategies for visual deficits    Time 4    Period Weeks    Status On-going                OT Long Term Goals - 09/17/20 0733       OT LONG TERM GOAL #1   Title I with all basic ADLS.    Time 12    Period Weeks    Status On-going      OT LONG TERM GOAL #2   Title Pt will demonstrate improved fine motor coordination for ADLs as evidenced by performing 9 hole peg test in 45 secs or less.    Time 12    Period Weeks    Status On-going      OT LONG TERM GOAL #3   Title Pt will perform simple snack/ beverage prep modified Independently.    Time 12    Period Weeks    Status On-going      OT LONG TERM GOAL #4   Title Pt will retrieve a 3 lbs object with RUE at 115 shoulder flexion with good control.    Time 12    Period Weeks    Status On-going      OT LONG TERM GOAL #5   Title Pt will perfrom simulated work activities modified Independently    Time 12    Period Weeks    Status On-going      OT LONG TERM GOAL #6   Title Pt will perfrom tabletop and environmental scanning with 90% or better accuracy in prep for work activities.    Time 12    Period Weeks    Status On-going  Plan - 09/17/20 0732     Clinical Impression Statement Pt very motivated for improved RUE use.  Patient shows increased tension in RUE when up on feet and balance is challenged.  This seems reduced in water setting.    OT Occupational Profile and History Detailed Assessment- Review of Records and additional review of physical, cognitive, psychosocial history related to current functional performance    Occupational performance deficits (Please refer to evaluation for details): ADL's;IADL's;Play;Work;Leisure;Social Participation    Body Structure / Function / Physical Skills ADL;Balance;Endurance;Mobility;Strength;Flexibility;UE functional use;FMC;Coordination;Gait;Vision;ROM;GMC;Decreased knowledge of precautions;Decreased knowledge of use of DME;Dexterity;IADL    Rehab Potential Good    Clinical Decision Making Limited treatment options, no task modification necessary     Comorbidities Affecting Occupational Performance: May have comorbidities impacting occupational performance    Modification or Assistance to Complete Evaluation  No modification of tasks or assist necessary to complete eval    OT Frequency 2x / week    OT Duration 12 weeks    OT Treatment/Interventions Self-care/ADL training;Therapeutic exercise;Functional Mobility Training;Balance training;Ultrasound;Neuromuscular education;Manual Therapy;Therapeutic activities;Paraffin;Cryotherapy;DME and/or AE instruction;Cognitive remediation/compensation;Visual/perceptual remediation/compensation;Fluidtherapy;Moist Heat;Electrical Stimulation;Passive range of motion;Patient/family education;Energy conservation;Aquatic Therapy;Gait Training    Plan aquatic therapy, NMR, address shoulder pain and normal movement    Consulted and Agree with Plan of Care Patient             Patient will benefit from skilled therapeutic intervention in order to improve the following deficits and impairments:   Body Structure / Function / Physical Skills: ADL, Balance, Endurance, Mobility, Strength, Flexibility, UE functional use, FMC, Coordination, Gait, Vision, ROM, GMC, Decreased knowledge of precautions, Decreased knowledge of use of DME, Dexterity, IADL       Visit Diagnosis: Unsteadiness on feet  Acute right hemiparesis (HCC)  Other lack of coordination  Muscle weakness (generalized)  Visuospatial deficit    Problem List Patient Active Problem List   Diagnosis Date Noted   Left pontine stroke ([facility]) 07/25/2020   Acute right hemiparesis ([facility]) 07/25/2020   Essential hypertension    History of CVA (cerebrovascular accident)    CKD (chronic kidney disease), stage II    Diabetes mellitus type 2 in obese ([facility])    Dyslipidemia    CVA (cerebral vascular accident) ([place]) 07/21/2020   Cryptogenic stroke ([facility]) 06/18/2020   Acute-on-chronic kidney injury ([facility]) 05/28/2020   Stroke ([facility]) 05/27/2020   Elevated  troponin 01/13/2019   COVID-19 virus infection 01/13/2019   S/P CABG (coronary artery bypass graft)    CAD (coronary artery disease) 04/28/2011   Insulin dependent type 2 diabetes mellitus, controlled ([facility])    Hyperlipidemia    Hypertension    Obesity (BMI 30-39.9)    Nephrolithiasis, uric acid     Mariah Milling 09/17/2020, 7:35 AM  [facility] [address] [place], [place], [address] Phone: [phone]   Fax:  [phone]  Name: Mitchell Lambert MRN: [id number] Date of Birth: [birth year]

## 2020-09-18 ENCOUNTER — Ambulatory Visit: Payer: Medicare Other

## 2020-09-18 ENCOUNTER — Ambulatory Visit: Payer: Medicare Other | Admitting: Occupational Therapy

## 2020-09-24 ENCOUNTER — Ambulatory Visit: Payer: Medicare Other | Admitting: Physical Therapy

## 2020-09-24 ENCOUNTER — Ambulatory Visit: Payer: Medicare Other | Admitting: Occupational Therapy

## 2020-09-26 ENCOUNTER — Other Ambulatory Visit: Payer: Self-pay

## 2020-09-26 ENCOUNTER — Encounter: Payer: Self-pay | Admitting: Occupational Therapy

## 2020-09-26 ENCOUNTER — Ambulatory Visit: Payer: Medicare Other

## 2020-09-26 ENCOUNTER — Ambulatory Visit: Payer: Medicare Other | Admitting: Occupational Therapy

## 2020-09-26 ENCOUNTER — Ambulatory Visit (INDEPENDENT_AMBULATORY_CARE_PROVIDER_SITE_OTHER): Payer: Medicare Other

## 2020-09-26 DIAGNOSIS — R2689 Other abnormalities of gait and mobility: Secondary | ICD-10-CM

## 2020-09-26 DIAGNOSIS — R278 Other lack of coordination: Secondary | ICD-10-CM | POA: Diagnosis not present

## 2020-09-26 DIAGNOSIS — R2681 Unsteadiness on feet: Secondary | ICD-10-CM | POA: Diagnosis not present

## 2020-09-26 DIAGNOSIS — R41842 Visuospatial deficit: Secondary | ICD-10-CM

## 2020-09-26 DIAGNOSIS — G8191 Hemiplegia, unspecified affecting right dominant side: Secondary | ICD-10-CM | POA: Diagnosis not present

## 2020-09-26 DIAGNOSIS — I639 Cerebral infarction, unspecified: Secondary | ICD-10-CM

## 2020-09-26 DIAGNOSIS — M6281 Muscle weakness (generalized): Secondary | ICD-10-CM

## 2020-09-26 NOTE — Therapy (Signed)
[facility] [address] [place], [place], [address] Phone: [phone]   Fax:  [phone]  Physical Therapy Treatment  Patient Details  Name: Mitchell Lambert MRN: [id number] Date of Birth: [birth year] Referring Provider (PT): Garvin Fila, MD   Encounter Date: 09/26/2020   PT End of Session - 09/26/20 1151     Visit Number 8    Number of Visits 17    Date for PT Re-Evaluation 10/15/20    Authorization Type Medicare - will need 10th visit PN    Progress Note Due on Visit 10    PT Start Time 1147    PT Stop Time 1230    PT Time Calculation (min) 43 min    Equipment Utilized During Treatment Gait belt    Activity Tolerance Patient tolerated treatment well    Behavior During Therapy WFL for tasks assessed/performed             Past Medical History:  Diagnosis Date   Angina    NO ANGINA SINCE CABG   Arthritis    right ankle   Chronic daily headache    "@ least every other day""improved since heart surgery"   Coronary artery disease    PT [place] CABG SURGERY - DR. TILLEY IS HIS CARDIOLOGIST   Diabetes mellitus    Lantus x 10 yrs   Gout    History of kidney stones 09-08-12   multiple times with some lithotripsies   Hyperlipemia    Hypertension     Past Surgical History:  Procedure Laterality Date   BACK SURGERY     microsurgery: spinal stenosis -lumbar   bone spur  1990's   right great toe; "probably related to gout"   BUBBLE STUDY  06/17/2020   Procedure: BUBBLE STUDY;  Surgeon: Lelon Perla, MD;  Location: [facility];  Service: Cardiovascular;;   CARDIAC CATHETERIZATION  04/27/11   CATARACT EXTRACTION W/ INTRAOCULAR LENS IMPLANT  1990's   right; "lens was replaced twice over 3 months; lens is actually over iris"   CORONARY ARTERY BYPASS GRAFT  04/30/2011   Procedure: CORONARY ARTERY BYPASS GRAFTING (CABG);  Surgeon: Melrose Nakayama, MD;  Location: [facility];   Service: Open Heart Surgery;  Laterality: N/A;,x4 vessels   CYSTOSCOPY W/ URETERAL STENT PLACEMENT Right 08/19/2012   Procedure: CYSTOSCOPY WITH RETROGRADE PYELOGRAM/URETERAL STENT PLACEMENT;  Surgeon: Molli Hazard, MD;  Location: [facility];  Service: Urology;  Laterality: Right;   CYSTOSCOPY WITH RETROGRADE PYELOGRAM, URETEROSCOPY AND STENT PLACEMENT Left 07/17/2013   Procedure: CYSTOSCOPY, LEFT URETEROSCOPY, BASKET EXTRACTION OF STONE, INSERTION OF LEFT URETERAL STENT, ;  Surgeon: Jorja Loa, MD;  Location: [facility];  Service: Urology;  Laterality: Left;   CYSTOSCOPY/RETROGRADE/URETEROSCOPY Right 09/12/2012   Procedure: CYSTOSCOPY/RETROGRADE/URETEROSCOPY;  Surgeon: Franchot Gallo, MD;  Location: [facility];  Service: Urology;  Laterality: Right;   HOLMIUM LASER APPLICATION Right [id number]   Procedure: HOLMIUM LASER APPLICATION;  Surgeon: Franchot Gallo, MD;  Location: [facility];  Service: Urology;  Laterality: Right;   HOLMIUM LASER APPLICATION Left [id number]   Procedure: HOLMIUM LASER APPLICATION;  Surgeon: Jorja Loa, MD;  Location: [facility];  Service: Urology;  Laterality: Left;   KIDNEY STONE SURGERY     "probably 3 times"   LEFT HEART CATHETERIZATION WITH CORONARY ANGIOGRAM N/A 04/28/2011   Procedure: LEFT HEART CATHETERIZATION WITH CORONARY ANGIOGRAM;  Surgeon: Jacolyn Reedy, MD;  Location: [facility];  Service: Cardiovascular;  Laterality: N/A;  LITHOTRIPSY     "many; probably 5 times"   RADIAL ARTERY HARVEST  04/30/2011   Procedure: RADIAL ARTERY HARVEST;  Surgeon: Melrose Nakayama, MD;  Location: [facility];  Service: Open Heart Surgery;  Laterality: Left;   RETINAL DETACHMENT SURGERY  1990's   right eye; "made me have an early cataract"   TEE WITHOUT CARDIOVERSION N/A 06/17/2020   Procedure: TRANSESOPHAGEAL ECHOCARDIOGRAM (TEE);  Surgeon: Lelon Perla, MD;  Location: [facility];  Service: Cardiovascular;  Laterality: N/A;   VASECTOMY      There were no  vitals filed for this visit.   Subjective Assessment - 09/26/20 1149     Subjective Enjoyed the pool. Got in the pool while he was in the beach. No new changes/complaints.    Patient is accompained by: Family member    Pertinent History achilles tendon tear on L    Limitations Standing;Sitting;House hold activities    How long can you stand comfortably? No issues    How long can you walk comfortably? In house only    Patient Stated Goals Return to their own home, play drums again    Currently in Pain? Yes    Pain Score 2     Pain Location Shoulder    Pain Orientation Right    Pain Descriptors / Indicators Aching;Dull    Pain Type Acute pain    Pain Onset More than a month ago    Pain Frequency Intermittent                [facility] Adult PT Treatment/Exercise - 09/26/20 0001       Transfers   Transfers Sit to Stand;Stand to Sit    Sit to Stand 5: Supervision    Stand to Sit 5: Supervision      Ambulation/Gait   Ambulation/Gait Yes    Ambulation/Gait Assistance 5: Supervision    Ambulation/Gait Assistance Details Completed ambulation indoors x 400 ft and outdoors x 200 ft with SPC w/ quad tip attachment. Patietn limited outdoors due to senstivity to light. No instances of imbalance noted with SPC. PT educating on purchase of quad tip for his [facility] for improved stability. No recurvatum noted with ambulation today. PT provided new tennis balls for RW for improved propulsion.    Ambulation Distance (Feet) 600 Feet    Assistive device Straight cane    Gait Pattern Step-through pattern;Decreased stance time - right;Decreased dorsiflexion - right;Decreased weight shift to right;Right foot flat    Ambulation Surface Level;Indoor;Unlevel;Outdoor;Paved      Exercises   Exercises Knee/Hip;Other Exercises    Other Exercises  Completed mini squats with light UE support, cues to sit back into squat vs leaning forward to promote improved muscle activation, completed 2 x 10 reps w/  intermittent standing rest breaks.      Knee/Hip Exercises: Standing   Other Standing Knee Exercises Completed standing hamstring curl with 2# ankle weight on RLE, completed 2 x 5 reps. Cues for control.              Balance Exercises - 09/26/20 0001       Balance Exercises: Standing   Rockerboard Anterior/posterior;Lateral;Head turns;EO;Intermittent UE support;Limitations    Rockerboard Limitations standing on rockerboard A/P; completed holding steady with EO x 30 seconds, then added addition of vertical head turns x 10 reps with itnermittent UE support, then completed A/P weight shift x 15 reps. Cues for proper weight shift and posture. on rockerboard lateral:   completed holding steady with EO  x 30 seconds, then added addition of horizontal head turns x 10 reps with itnermittent UE support, then completed R/L weight shift x 15 reps. Cues for posture without, increased challenge with lateral > A/P.    Retro Gait Upper extremity support;Limitations    Retro Gait Limitations at countertop light UE support: completed backward walking x 3 laps down and back. cues for step length.               PT Education - 09/26/20 1242     Education Details Quad Tip Attachment Purchase Options    Person(s) Educated Patient    Methods Explanation;Handout    Comprehension Verbalized understanding              PT Short Term Goals - 09/17/20 1155       PT SHORT TERM GOAL #1   Title Pt will be independent with initial HEP    Baseline pt reports compliance (09/17/20)    Time 4    Period Weeks    Status Achieved    Target Date 09/17/20      PT SHORT TERM GOAL #2   Title Pt will be able to demo at least 3/5 R ankle DF to play on his drums    Baseline 3+/5 on R ankle DF (09/17/20)    Time 4    Period Weeks    Status Achieved    Target Date 09/17/20      PT SHORT TERM GOAL #3   Title Pt will be able to walk >500' with LRAD and SBA for improved home/community mobility    Baseline 545'  with st. cane (quad tip) with CGA    Time 4    Period Weeks    Status On-going    Target Date 09/17/20      PT SHORT TERM GOAL #4   Title Pt will be able to ascend/descend at least 20 steps with railing and SBA for safe return home    Baseline Performed 4 steps with bilat rails and min A; performed 20 steps with one rail and st. cane on contralateral side with SBA (09/17/20)    Time 4    Period Weeks    Status Achieved    Target Date 09/17/20               PT Long Term Goals - 08/20/20 1703       PT LONG TERM GOAL #1   Title Pt will be independent with advanced HEP for strengthening and balance    Time 8    Period Weeks    Status New    Target Date 10/15/20      PT LONG TERM GOAL #2   Title Pt will have improved Berg Balance Score of at least 45/56 to indicate decreased fall risk    Baseline 39/56    Time 8    Period Weeks    Status New    Target Date 10/15/20      PT LONG TERM GOAL #3   Title Pt will have improved DGI score to at least 19/24 to demo decreased fall risk    Baseline 17/24    Time 8    Period Weeks    Status New    Target Date 10/15/20      PT LONG TERM GOAL #4   Title Pt will be able to amb at least 1000' with LRAD mod I on level and unlevel surfaces for community amb  Baseline Needs use of RW    Time 8    Period Weeks    Status New    Target Date 10/15/20      PT LONG TERM GOAL #5   Title Pt will be able to use R foot/ankle for drumming to return to leisure activities    Time 8    Period Weeks    Status New    Target Date 10/15/20                   Plan - 09/26/20 1242     Clinical Impression Statement Continued gait training with SPC with quad tip attachment with patient doing well, able to complete on outdoor surfaces but limited due to light sensitivity. Continued functional strengthening exercises and  balance activities to promote RLE weight shift/stance.    Personal Factors and Comorbidities Age;Fitness;Time since  onset of injury/illness/exacerbation;Comorbidity 1;Past/Current Experience    Comorbidities L Achilles tendon tear, chronic R LE edema, CAD s/p CABG, HTN, asthma, type 2 diabetes, CKD stage III, HLD, remote R retinal detachment, HTN, CVA.    Examination-Activity Limitations Squat;Stairs;Caring for Others;Locomotion Level    Examination-Participation Restrictions Cleaning;Community Activity;Driving;Occupation;Shop;Yard Work    [employer] Evolving/Moderate complexity    Rehab Potential Good    PT Frequency 2x / week    PT Duration 8 weeks    PT Treatment/Interventions ADLs/Self Care Home Management;Aquatic Therapy;Cryotherapy;Electrical Stimulation;Moist Heat;DME Instruction;Gait training;Stair training;Functional mobility training;Therapeutic activities;Therapeutic exercise;Balance training;Neuromuscular re-education;Manual techniques;Patient/family education;Passive range of motion;Taping;Joint Manipulations    PT Next Visit Plan Continue gait training with SPC with quad tip. Continue to progress R LE strengthening (and working on active DF/PF) Work on R LE coordination/control. Did we get quad tip for use at home? Hamstring Strengthening activities.    PT Home Exercise Plan Access Code: [email or web address]; Walking program (09/17/20)- pt instructions    Consulted and Agree with Plan of Care Patient;Family member/caregiver    Family Member Consulted Wife             Patient will benefit from skilled therapeutic intervention in order to improve the following deficits and impairments:  Abnormal gait, Decreased range of motion, Difficulty walking, Increased fascial restricitons, Impaired tone, Decreased activity tolerance, Decreased balance, Decreased mobility, Decreased strength, Increased edema  Visit Diagnosis: Unsteadiness on feet  Muscle weakness (generalized)  Other abnormalities of gait and mobility     Problem List Patient Active Problem List   Diagnosis Date Noted    Left pontine stroke ([facility]) 07/25/2020   Acute right hemiparesis ([facility]) 07/25/2020   Essential hypertension    History of CVA (cerebrovascular accident)    CKD (chronic kidney disease), stage II    Diabetes mellitus type 2 in obese ([facility])    Dyslipidemia    CVA (cerebral vascular accident) ([facility]) 07/21/2020   Cryptogenic stroke ([facility] [facility]) 06/18/2020   Acute-on-chronic kidney injury ([facility]) 05/28/2020   Stroke ([facility]) 05/27/2020   Elevated troponin 01/13/2019   COVID-19 virus infection 01/13/2019   S/P CABG (coronary artery bypass graft)    CAD (coronary artery disease) 04/28/2011   Insulin dependent type 2 diabetes mellitus, controlled ([facility])    Hyperlipidemia    Hypertension    Obesity (BMI 30-39.9)    Nephrolithiasis, uric acid     Jones Bales, PT, DPT 09/26/2020, 12:46 PM  [facility] [address] [place], [place], [address] Phone: [phone]   Fax:  [phone]  Name: Mitchell Lambert MRN: [id number] Date of Birth:  07/09/1950    

## 2020-09-27 NOTE — Therapy (Signed)
[facility] [address] [place], [place], [address] Phone: [phone]   Fax:  [phone]  Occupational Therapy Treatment  Patient Details  Name: Mitchell Lambert MRN: [id number] Date of Birth: 1950/03/14 Referring Provider (OT): Dr. Leonie Man   Encounter Date: 09/26/2020   OT End of Session - 09/26/20 1114     Visit Number 8    Number of Visits 24    Date for OT Re-Evaluation 11/12/20    Authorization Type Medicare    Authorization - Visit Number 8    Authorization - Number of Visits 10    Progress Note Due on Visit 10    OT Start Time 1104    OT Stop Time 1147    OT Time Calculation (min) 43 min    Activity Tolerance Patient tolerated treatment well    Behavior During Therapy WFL for tasks assessed/performed             Past Medical History:  Diagnosis Date   Angina    NO ANGINA SINCE CABG   Arthritis    right ankle   Chronic daily headache    "@ least every other day""improved since heart surgery"   Coronary artery disease    PT [place] CABG SURGERY - DR. TILLEY IS HIS CARDIOLOGIST   Diabetes mellitus    Lantus x 10 yrs   Gout    History of kidney stones 09-08-12   multiple times with some lithotripsies   Hyperlipemia    Hypertension     Past Surgical History:  Procedure Laterality Date   BACK SURGERY     microsurgery: spinal stenosis -lumbar   bone spur  1990's   right great toe; "probably related to gout"   BUBBLE STUDY  06/17/2020   Procedure: BUBBLE STUDY;  Surgeon: Lelon Perla, MD;  Location: [facility];  Service: Cardiovascular;;   CARDIAC CATHETERIZATION  04/27/11   CATARACT EXTRACTION W/ INTRAOCULAR LENS IMPLANT  1990's   right; "lens was replaced twice over 3 months; lens is actually over iris"   CORONARY ARTERY BYPASS GRAFT  04/30/2011   Procedure: CORONARY ARTERY BYPASS GRAFTING (CABG);  Surgeon: Melrose Nakayama, MD;  Location: [facility];  Service: Open  Heart Surgery;  Laterality: N/A;,x4 vessels   CYSTOSCOPY W/ URETERAL STENT PLACEMENT Right 08/19/2012   Procedure: CYSTOSCOPY WITH RETROGRADE PYELOGRAM/URETERAL STENT PLACEMENT;  Surgeon: Molli Hazard, MD;  Location: [facility];  Service: Urology;  Laterality: Right;   CYSTOSCOPY WITH RETROGRADE PYELOGRAM, URETEROSCOPY AND STENT PLACEMENT Left 07/17/2013   Procedure: CYSTOSCOPY, LEFT URETEROSCOPY, BASKET EXTRACTION OF STONE, INSERTION OF LEFT URETERAL STENT, ;  Surgeon: Jorja Loa, MD;  Location[facility];  Service: Urology;  Laterality: Left;   CYSTOSCOPY/RETROGRADE/URETEROSCOPY Right 09/12/2012   Procedure: CYSTOSCOPY/RETROGRADE/URETEROSCOPY;  Surgeon: Franchot Gallo, MD;  [facility];  Service: Urology;  Laterality: Right;   HOLMIUM LASER APPLICATION Right [id number]   Procedure: HOLMIUM LASER APPLICATION;  Surgeon: Franchot Gallo, MD;  [facility];  Service: Urology;  Laterality: Right;   HOLMIUM LASER APPLICATION Left [id number]   Procedure: HOLMIUM LASER APPLICATION;  Surgeon: Jorja Loa, MD;  [facility];  Service: Urology;  Laterality: Left;   KIDNEY STONE SURGERY     "probably 3 times"   LEFT HEART CATHETERIZATION WITH CORONARY ANGIOGRAM N/A 04/28/2011   Procedure: LEFT HEART CATHETERIZATION WITH CORONARY ANGIOGRAM;  Surgeon: Jacolyn Reedy, MD;  Location: [facility] CATH LAB;  Service: Cardiovascular;  Laterality: N/A;  LITHOTRIPSY     "many; probably 5 times"   RADIAL ARTERY HARVEST  04/30/2011   Procedure: RADIAL ARTERY HARVEST;  Surgeon: Melrose Nakayama, MD;  Location: [facility];  Service: Open Heart Surgery;  Laterality: Left;   RETINAL DETACHMENT SURGERY  1990's   right eye; "made me have an early cataract"   TEE WITHOUT CARDIOVERSION N/A 06/17/2020   Procedure: TRANSESOPHAGEAL ECHOCARDIOGRAM (TEE);  Surgeon: Lelon Perla, MD;  Location: [facility] ENDOSCOPY;  Service: Cardiovascular;  Laterality: N/A;   VASECTOMY      There were no vitals filed for  this visit.   Subjective Assessment - 09/26/20 1107     Subjective  Pt reports persistent soreness in the R shoulder; "the healing is just so slow"    Pertinent History 70 y/o male presented to  ED for concern of dizziness and diplopia on 07/21/20. MRI showed acute L pontine CVA and prior L parietal infract.  PMH: CAD s/p CABG, HTN, asthma, type 2 diabetes, CKD stage III, HLD, remote R retinal detachment, HTN, CVA.    Patient Stated Goals regain use of RUE    Currently in Pain? Yes    Pain Score 2     Pain Location Shoulder    Pain Orientation Right    Pain Descriptors / Indicators Aching;Dull    Pain Type Acute pain    Pain Radiating Towards R shoulder and upper arm    Pain Onset More than a month ago    Pain Frequency Intermittent    Aggravating Factors  Movement             Treatment/Exercises - 09/26/20    Neuro Reed Closed-chain BUE shoulder flex w/ foam noodle in supine to facilitate scapular alignment w/ retraction and decrease atypical compensatory movement patterns observed when pt attempted exercise in seated position. Pt also completed BUE chest press w/ focus on R elbow ext  External rotation completed in supine w/ wedge and towel positioned under RUE for neutral shoulder alignment. Pt able to complete approx 50% of ROM within pain-free range; OT provided tactile/verbal cues to maintain pt within this range  Weight bearing through R elbow/forearm and incorporating gentle trunk rotation, alternating between reaching to R hand w/ contralateral UE and overhead reach    Therapeutic Activities Copying pattern onto small pegboard w/ mini pegs; pt able to complete activity w/ no cues for spatial orientation. Activity facilitated Dewey, eye-hand coordination, alternating attention, and hand strengthening. Activity graded up w/ pt retrieving 2 pegs at a time, translating to fingertips, and placing peg while holding 2nd peg; pt completed activity successfully w/ no drops and min cueing  for compensatory patterns            OT Education - 09/26/20 1140     Education Details Condition-specific education    Person(s) Educated Patient    Methods Explanation    Comprehension Verbalized understanding             OT Short Term Goals - 09/26/20 1144       OT SHORT TERM GOAL #1   Title I with HEP for vision, coordination    Time 4    Period Weeks    Status On-going    Target Date 09/17/20      OT SHORT TERM GOAL #2   Title Pt will demonstrate improved RUE fine motor coordination for ADLS as evidenced by decreasing 9 hole peg test score to 52 secs or less    Time 4  Period Weeks    Status Achieved   09/26/20 - 50 sec 1st trial; 34 sec 2nd trial     OT SHORT TERM GOAL #3   Title Pt will report that he is cutting food independently and  feeding himself with RUE at least 75% of the time.    Time 4    Period Weeks    Status On-going      OT SHORT TERM GOAL #4   Title Pt will perfrom all basic ADLS with distant supervision.    Time 4    Period Weeks    Status On-going      OT SHORT TERM GOAL #5   Title Pt will demonstrate ability to retrieve a lightweight object at 110* shoulder flexion with good control and min compensation.    Time 4    Period Weeks    Status On-going      OT SHORT TERM GOAL #6   Title Pt will increase RUE grip strength to 45 lbs or greater for increased functional use.    Time 4    Period Weeks    Status Achieved   56.4 lbs     OT SHORT TERM GOAL #7   Title Pt will verbalzie understanding of compensatroy strategies for visual deficits    Time 4    Period Weeks    Status On-going             OT Long Term Goals - 09/17/20 0733       OT LONG TERM GOAL #1   Title I with all basic ADLS.    Time 12    Period Weeks    Status On-going      OT LONG TERM GOAL #2   Title Pt will demonstrate improved fine motor coordination for ADLs as evidenced by performing 9 hole peg test in 45 secs or less.    Time 12    Period Weeks     Status On-going      OT LONG TERM GOAL #3   Title Pt will perform simple snack/ beverage prep modified Independently.    Time 12    Period Weeks    Status On-going      OT LONG TERM GOAL #4   Title Pt will retrieve a 3 lbs object with RUE at 115 shoulder flexion with good control.    Time 12    Period Weeks    Status On-going      OT LONG TERM GOAL #5   Title Pt will perfrom simulated work activities modified Independently    Time 12    Period Weeks    Status On-going      OT LONG TERM GOAL #6   Title Pt will perfrom tabletop and environmental scanning with 90% or better accuracy in prep for work activities.    Time 12    Period Weeks    Status On-going             Plan - 09/26/20 1153     Clinical Impression Statement Pt continues to progress toward goals and demo'd very good FMC/in-hand manipulation skills this session. Cueing to prevent abnormal compensatory movement patterns during shoulder flexion/overhead reaching activities continued to be beneficial.    OT Occupational Profile and History Detailed Assessment- Review of Records and additional review of physical, cognitive, psychosocial history related to current functional performance    Occupational performance deficits (Please refer to evaluation for details): ADL's;IADL's;Play;Work;Leisure;Social Participation    Body Structure /  Function / Physical Skills ADL;Balance;Endurance;Mobility;Strength;Flexibility;UE functional use;FMC;Coordination;Gait;Vision;ROM;GMC;Decreased knowledge of precautions;Decreased knowledge of use of DME;Dexterity;IADL    Rehab Potential Good    Clinical Decision Making Limited treatment options, no task modification necessary    Comorbidities Affecting Occupational Performance: May have comorbidities impacting occupational performance    Modification or Assistance to Complete Evaluation  No modification of tasks or assist necessary to complete eval    OT Frequency 2x / week    OT  Duration 12 weeks    OT Treatment/Interventions Self-care/ADL training;Therapeutic exercise;Functional Mobility Training;Balance training;Ultrasound;Neuromuscular education;Manual Therapy;Therapeutic activities;Paraffin;Cryotherapy;DME and/or AE instruction;Cognitive remediation/compensation;Visual/perceptual remediation/compensation;Fluidtherapy;Moist Heat;Electrical Stimulation;Passive range of motion;Patient/family education;Energy conservation;Aquatic Therapy;Gait Training    Plan aquatic therapy, NMR, address shoulder pain and normal movement, check STGs in preparation for upcoming progress note?    Consulted and Agree with Plan of Care Patient             Patient will benefit from skilled therapeutic intervention in order to improve the following deficits and impairments:   Body Structure / Function / Physical Skills: ADL, Balance, Endurance, Mobility, Strength, Flexibility, UE functional use, FMC, Coordination, Gait, Vision, ROM, GMC, Decreased knowledge of precautions, Decreased knowledge of use of DME, Dexterity, IADL   Visit Diagnosis: Muscle weakness (generalized)  Other lack of coordination  Visuospatial deficit  Other abnormalities of gait and mobility  Unsteadiness on feet    Problem List Patient Active Problem List   Diagnosis Date Noted   Left pontine stroke ([facility]) 07/25/2020   Acute right hemiparesis ([facility]) 07/25/2020   Essential hypertension    History of CVA (cerebrovascular accident)    CKD (chronic kidney disease), stage II    Diabetes mellitus type 2 in obese ([facility])    Dyslipidemia    CVA (cerebral vascular accident) ([facility]) 07/21/2020   Cryptogenic stroke ([facility]) 06/18/2020   Acute-on-chronic kidney injury ([facility]) 05/28/2020   Stroke ([facility]) 05/27/2020   Elevated troponin 01/13/2019   COVID-19 virus infection 01/13/2019   S/P CABG (coronary artery bypass graft)    CAD (coronary artery disease) 04/28/2011   Insulin dependent type 2 diabetes mellitus,  controlled ([facility])    Hyperlipidemia    Hypertension    Obesity (BMI 30-39.9)    Nephrolithiasis, uric acid      Kathrine Cords, OTR/L, MSOT  09/27/2020, 10:54 AM  [facility] [address] [place], [place], [address] Phone: [phone]   Fax:  [phone]  Name: Mitchell Lambert MRN: [id number] Date of Birth: May 19, 1950

## 2020-09-28 LAB — CUP PACEART REMOTE DEVICE CHECK
Date Time Interrogation Session: 20220728085706
Implantable Pulse Generator Implant Date: 20220419

## 2020-10-01 ENCOUNTER — Encounter: Payer: Self-pay | Admitting: Occupational Therapy

## 2020-10-01 ENCOUNTER — Encounter: Payer: Medicare Other | Admitting: Physical Medicine & Rehabilitation

## 2020-10-01 ENCOUNTER — Other Ambulatory Visit: Payer: Self-pay

## 2020-10-01 ENCOUNTER — Ambulatory Visit: Payer: Medicare Other | Admitting: Physical Therapy

## 2020-10-01 ENCOUNTER — Encounter: Payer: Self-pay | Admitting: Physical Therapy

## 2020-10-01 ENCOUNTER — Ambulatory Visit: Payer: Medicare Other | Admitting: Occupational Therapy

## 2020-10-01 DIAGNOSIS — R2681 Unsteadiness on feet: Secondary | ICD-10-CM

## 2020-10-01 DIAGNOSIS — M6281 Muscle weakness (generalized): Secondary | ICD-10-CM | POA: Insufficient documentation

## 2020-10-01 DIAGNOSIS — R41842 Visuospatial deficit: Secondary | ICD-10-CM | POA: Insufficient documentation

## 2020-10-01 DIAGNOSIS — R278 Other lack of coordination: Secondary | ICD-10-CM | POA: Insufficient documentation

## 2020-10-01 DIAGNOSIS — M7541 Impingement syndrome of right shoulder: Secondary | ICD-10-CM | POA: Diagnosis not present

## 2020-10-01 DIAGNOSIS — R2689 Other abnormalities of gait and mobility: Secondary | ICD-10-CM | POA: Insufficient documentation

## 2020-10-01 DIAGNOSIS — I639 Cerebral infarction, unspecified: Secondary | ICD-10-CM | POA: Diagnosis not present

## 2020-10-01 DIAGNOSIS — G8191 Hemiplegia, unspecified affecting right dominant side: Secondary | ICD-10-CM | POA: Insufficient documentation

## 2020-10-01 NOTE — Therapy (Signed)
[facility] [address] [place], [place], [address] Phone: [phone]   Fax:  [phone]  Occupational Therapy Treatment  Patient Details  Name: Mitchell Lambert MRN: [id number] Date of Birth: 1950-07-01 Referring Provider (OT): Dr. Leonie Man   Encounter Date: 10/01/2020   OT End of Session - 10/01/20 1541     Visit Number 9    Number of Visits 24    Date for OT Re-Evaluation 11/12/20    Authorization Type Medicare    Authorization - Visit Number 9    Authorization - Number of Visits 10    Progress Note Due on Visit 10    OT Start Time 1145    OT Stop Time 1230    OT Time Calculation (min) 45 min    Activity Tolerance Patient tolerated treatment well    Behavior During Therapy WFL for tasks assessed/performed             Past Medical History:  Diagnosis Date   Angina    NO ANGINA SINCE CABG   Arthritis    right ankle   Chronic daily headache    "@ least every other day""improved since heart surgery"   Coronary artery disease    PT [facility] CABG SURGERY - DR. TILLEY IS HIS CARDIOLOGIST   Diabetes mellitus    Lantus x 10 yrs   Gout    History of kidney stones 09-08-12   multiple times with some lithotripsies   Hyperlipemia    Hypertension     Past Surgical History:  Procedure Laterality Date   BACK SURGERY     microsurgery: spinal stenosis -lumbar   bone spur  1990's   right great toe; "probably related to gout"   BUBBLE STUDY  06/17/2020   Procedure: BUBBLE STUDY;  Surgeon: Lelon Perla, MD;  Location: [facility];  Service: Cardiovascular;;   CARDIAC CATHETERIZATION  04/27/11   CATARACT EXTRACTION W/ INTRAOCULAR LENS IMPLANT  1990's   right; "lens was replaced twice over 3 months; lens is actually over iris"   CORONARY ARTERY BYPASS GRAFT  04/30/2011   Procedure: CORONARY ARTERY BYPASS GRAFTING (CABG);  Surgeon: Melrose Nakayama, MD;  Location: [facility];  Service: Open  Heart Surgery;  Laterality: N/A;,x4 vessels   CYSTOSCOPY W/ URETERAL STENT PLACEMENT Right 08/19/2012   Procedure: CYSTOSCOPY WITH RETROGRADE PYELOGRAM/URETERAL STENT PLACEMENT;  Surgeon: Molli Hazard, MD;  [facility];  Service: Urology;  Laterality: Right;   CYSTOSCOPY WITH RETROGRADE PYELOGRAM, URETEROSCOPY AND STENT PLACEMENT Left 07/17/2013   Procedure: CYSTOSCOPY, LEFT URETEROSCOPY, BASKET EXTRACTION OF STONE, INSERTION OF LEFT URETERAL STENT, ;  Surgeon: Jorja Loa, MD;  [facility];  Service: Urology;  Laterality: Left;   CYSTOSCOPY/RETROGRADE/URETEROSCOPY Right 09/12/2012   Procedure: CYSTOSCOPY/RETROGRADE/URETEROSCOPY;  Surgeon: Franchot Gallo, MD;  [facility];  Service: Urology;  Laterality: Right;   HOLMIUM LASER APPLICATION Right 06/23/5359   Procedure: HOLMIUM LASER APPLICATION;  Surgeon: Franchot Gallo, MD;  [facility];  Service: Urology;  Laterality: Right;   HOLMIUM LASER APPLICATION Left 4/43/1540   Procedure: HOLMIUM LASER APPLICATION;  Surgeon: Jorja Loa, MD;  [facility];  Service: Urology;  Laterality: Left;   KIDNEY STONE SURGERY     "probably 3 times"   LEFT HEART CATHETERIZATION WITH CORONARY ANGIOGRAM N/A 04/28/2011   Procedure: LEFT HEART CATHETERIZATION WITH CORONARY ANGIOGRAM;  Surgeon: Jacolyn Reedy, MD;  Location[facility] CATH LAB;  Service: Cardiovascular;  Laterality: N/A;  LITHOTRIPSY     "many; probably 5 times"   RADIAL ARTERY HARVEST  04/30/2011   Procedure: RADIAL ARTERY HARVEST;  Surgeon: Melrose Nakayama, MD;  Location: [facility];  Service: Open Heart Surgery;  Laterality: Left;   RETINAL DETACHMENT SURGERY  1990's   right eye; "made me have an early cataract"   TEE WITHOUT CARDIOVERSION N/A 06/17/2020   Procedure: TRANSESOPHAGEAL ECHOCARDIOGRAM (TEE);  Surgeon: Lelon Perla, MD;  Location: [facility] ENDOSCOPY;  Service: Cardiovascular;  Laterality: N/A;   VASECTOMY      There were no vitals filed for  this visit.   Subjective Assessment - 10/01/20 1533     Subjective  Patient reports pain at night in right shoulder up to 8/10 at night    Pertinent History 70 y/o male presented to  ED for concern of dizziness and diplopia on 07/21/20. MRI showed acute L pontine CVA and prior L parietal infract.  PMH: CAD s/p CABG, HTN, asthma, type 2 diabetes, CKD stage III, HLD, remote R retinal detachment, HTN, CVA.    Currently in Pain? Yes    Pain Score 2     Pain Location Shoulder    Pain Orientation Right    Pain Descriptors / Indicators Aching;Dull    Pain Type Acute pain    Pain Radiating Towards r shoulder and arm    Pain Onset More than a month ago    Pain Frequency Intermittent    Aggravating Factors  sustained supine    Pain Relieving Factors positional change                          OT Treatments/Exercises (OP) - 10/01/20 0001       Neurological Re-education Exercises   Other Exercises 1 Neuromuscular reeducation to address postural control as related to right shoulder pain.  Patient had appointment with physiatrist today - needed to reschedule.  Will reach out to MD regarding medical management options for shoulder pain.    Other Exercises 2 Working on active weight shift to right side - working for active lengthening in right trunk and active shortening with weight shift left.  Patient with some increase in tension toward internal rotation with initial attempts at active reach.  With cueing and facilitation, able to correct.  Worked in standing - active standing - cueing and facilitation to prevent locking knee.  Single limb stance to address RLE strength, alignment, control.  Holding ball bilaterally when walking - working on cadence of steps, and BUE function - tossing, catching, clapping.  Patient without pain with low reach movements this session.  Patient to start in pool 1x/week for OT next week.                    OT Education - 10/01/20 1542      Education Details Discussed supporting arm with pillows at night, sleeping on wedge, or in recliner to prevent night pain.  Discussed potential medical options    Person(s) Educated Patient    Methods Explanation    Comprehension Verbalized understanding              OT Short Term Goals - 10/01/20 1216       OT SHORT TERM GOAL #1   Title I with HEP for vision, coordination    Time 4    Period Weeks    Status Achieved    Target Date 09/17/20      OT SHORT  TERM GOAL #2   Title Pt will demonstrate improved RUE fine motor coordination for ADLS as evidenced by decreasing 9 hole peg test score to 52 secs or less    Time 4    Period Weeks    Status Achieved   09/26/20 - 50 sec 1st trial; 34 sec 2nd trial     OT SHORT TERM GOAL #3   Title Pt will report that he is cutting food independently and  feeding himself with RUE at least 75% of the time.    Time 4    Period Weeks    Status Achieved      OT SHORT TERM GOAL #4   Title Pt will perfrom all basic ADLS with distant supervision.    Time 4    Period Weeks    Status Achieved      OT SHORT TERM GOAL #5   Title Pt will demonstrate ability to retrieve a lightweight object at 110* shoulder flexion with good control and min compensation.    Time 4    Period Weeks    Status Achieved   120     OT SHORT TERM GOAL #6   Title Pt will increase RUE grip strength to 45 lbs or greater for increased functional use.    Time 4    Period Weeks    Status Achieved   56.4 lbs     OT SHORT TERM GOAL #7   Title Pt will verbalzie understanding of compensatroy strategies for visual deficits    Time 4    Period Weeks    Status Achieved               OT Long Term Goals - 10/01/20 1546       OT LONG TERM GOAL #1   Title I with all basic ADLS.    Time 12    Period Weeks    Status On-going      OT LONG TERM GOAL #2   Title Pt will demonstrate improved fine motor coordination for ADLs as evidenced by performing 9 hole peg test in 45  secs or less.    Time 12    Period Weeks    Status On-going      OT LONG TERM GOAL #3   Title Pt will perform simple snack/ beverage prep modified Independently.    Time 12    Period Weeks    Status On-going      OT LONG TERM GOAL #4   Title Pt will retrieve a 3 lbs object with RUE at 115 shoulder flexion with good control.    Time 12    Period Weeks    Status On-going      OT LONG TERM GOAL #5   Title Pt will perfrom simulated work activities modified Independently    Time 12    Period Weeks    Status On-going      OT LONG TERM GOAL #6   Title Pt will perfrom tabletop and environmental scanning with 90% or better accuracy in prep for work activities.    Time 12    Period Weeks    Status On-going                   Plan - 10/01/20 1541     Clinical Impression Statement Pt has met all short term goals and is progressing toward long term goals.  Patient limited by RUE pain, especially at night.    OT Occupational  Profile and History Detailed Assessment- Review of Records and additional review of physical, cognitive, psychosocial history related to current functional performance    Occupational performance deficits (Please refer to evaluation for details): ADL's;IADL's;Play;Work;Leisure;Social Participation    Body Structure / Function / Physical Skills ADL;Balance;Endurance;Mobility;Strength;Flexibility;UE functional use;FMC;Coordination;Gait;Vision;ROM;GMC;Decreased knowledge of precautions;Decreased knowledge of use of DME;Dexterity;IADL    Rehab Potential Good    Clinical Decision Making Limited treatment options, no task modification necessary    Comorbidities Affecting Occupational Performance: May have comorbidities impacting occupational performance    Modification or Assistance to Complete Evaluation  No modification of tasks or assist necessary to complete eval    OT Frequency 2x / week    OT Duration 12 weeks    OT Treatment/Interventions Self-care/ADL  training;Therapeutic exercise;Functional Mobility Training;Balance training;Ultrasound;Neuromuscular education;Manual Therapy;Therapeutic activities;Paraffin;Cryotherapy;DME and/or AE instruction;Cognitive remediation/compensation;Visual/perceptual remediation/compensation;Fluidtherapy;Moist Heat;Electrical Stimulation;Passive range of motion;Patient/family education;Energy conservation;Aquatic Therapy;Gait Training    Plan aquatic therapy, NMR, address shoulder pain and normal movement    Consulted and Agree with Plan of Care Patient             Patient will benefit from skilled therapeutic intervention in order to improve the following deficits and impairments:   Body Structure / Function / Physical Skills: ADL, Balance, Endurance, Mobility, Strength, Flexibility, UE functional use, FMC, Coordination, Gait, Vision, ROM, GMC, Decreased knowledge of precautions, Decreased knowledge of use of DME, Dexterity, IADL       Visit Diagnosis: Muscle weakness (generalized)  Other lack of coordination  Visuospatial deficit  Unsteadiness on feet    Problem List Patient Active Problem List   Diagnosis Date Noted   Left pontine stroke ([facility]) 07/25/2020   Acute right hemiparesis ([facility]) 07/25/2020   Essential hypertension    History of CVA (cerebrovascular accident)    CKD (chronic kidney disease), stage II    Diabetes mellitus type 2 in obese ([facility])    Dyslipidemia    CVA (cerebral vascular accident) ([facility]) 07/21/2020   Cryptogenic stroke ([facility]) 06/18/2020   Acute-on-chronic kidney injury ([facility]) 05/28/2020   Stroke ([facility]) 05/27/2020   Elevated troponin 01/13/2019   COVID-19 virus infection 01/13/2019   S/P CABG (coronary artery bypass graft)    CAD (coronary artery disease) 04/28/2011   Insulin dependent type 2 diabetes mellitus, controlled ([place] [facility])    Hyperlipidemia    Hypertension    Obesity (BMI 30-39.9)    Nephrolithiasis, uric acid     Mariah Milling 10/01/2020, 3:48 PM  [facility] [address] [place], [place], [address] Phone: [phone]   Fax:  [phone]  Name: Mitchell Lambert MRN: [id number] Date of Birth: 09/10/1950

## 2020-10-01 NOTE — Therapy (Signed)
[facility] [address] [place], [place], [address] Phone: [phone]   Fax:  [phone]  Physical Therapy Treatment  Patient Details  Name: Mitchell Lambert MRN: [id number] Date of Birth: 03-13-1950 Referring Provider (PT): Garvin Fila, MD   Encounter Date: 10/01/2020   PT End of Session - 10/01/20 1318     Visit Number 9    Number of Visits 17    Date for PT Re-Evaluation 10/15/20    Authorization Type Medicare - will need 10th visit PN    Progress Note Due on Visit 10    PT Start Time 1233    PT Stop Time 1315    PT Time Calculation (min) 42 min    Equipment Utilized During Treatment Gait belt    Activity Tolerance Patient tolerated treatment well    Behavior During Therapy WFL for tasks assessed/performed             Past Medical History:  Diagnosis Date   Angina    NO ANGINA SINCE CABG   Arthritis    right ankle   Chronic daily headache    "@ least every other day""improved since heart surgery"   Coronary artery disease    PT [place] CABG SURGERY - DR. TILLEY IS HIS CARDIOLOGIST   Diabetes mellitus    Lantus x 10 yrs   Gout    History of kidney stones 09-08-12   multiple times with some lithotripsies   Hyperlipemia    Hypertension     Past Surgical History:  Procedure Laterality Date   BACK SURGERY     microsurgery: spinal stenosis -lumbar   bone spur  1990's   right great toe; "probably related to gout"   BUBBLE STUDY  06/17/2020   Procedure: BUBBLE STUDY;  Surgeon: Lelon Perla, MD;  Location: [facility];  Service: Cardiovascular;;   CARDIAC CATHETERIZATION  04/27/11   CATARACT EXTRACTION W/ INTRAOCULAR LENS IMPLANT  1990's   right; "lens was replaced twice over 3 months; lens is actually over iris"   CORONARY ARTERY BYPASS GRAFT  04/30/2011   Procedure: CORONARY ARTERY BYPASS GRAFTING (CABG);  Surgeon: Melrose Nakayama, MD;  Location: [facility];  Service:  Open Heart Surgery;  Laterality: N/A;,x4 vessels   CYSTOSCOPY W/ URETERAL STENT PLACEMENT Right 08/19/2012   Procedure: CYSTOSCOPY WITH RETROGRADE PYELOGRAM/URETERAL STENT PLACEMENT;  Surgeon: Molli Hazard, MD;  Location: [facility];  Service: Urology;  Laterality: Right;   CYSTOSCOPY WITH RETROGRADE PYELOGRAM, URETEROSCOPY AND STENT PLACEMENT Left 07/17/2013   Procedure: CYSTOSCOPY, LEFT URETEROSCOPY, BASKET EXTRACTION OF STONE, INSERTION OF LEFT URETERAL STENT, ;  Surgeon: Jorja Loa, MD;  Location: [facility];  Service: Urology;  Laterality: Left;   CYSTOSCOPY/RETROGRADE/URETEROSCOPY Right 09/12/2012   Procedure: CYSTOSCOPY/RETROGRADE/URETEROSCOPY;  Surgeon: Franchot Gallo, MD;  Location: [facility];  Service: Urology;  Laterality: Right;   HOLMIUM LASER APPLICATION Right [id number]   Procedure: HOLMIUM LASER APPLICATION;  Surgeon: Franchot Gallo, MD;  Location: [facility];  Service: Urology;  Laterality: Right;   HOLMIUM LASER APPLICATION Left [id number]   Procedure: HOLMIUM LASER APPLICATION;  Surgeon: Jorja Loa, MD;  Location: [facility];  Service: Urology;  Laterality: Left;   KIDNEY STONE SURGERY     "probably 3 times"   LEFT HEART CATHETERIZATION WITH CORONARY ANGIOGRAM N/A 04/28/2011   Procedure: LEFT HEART CATHETERIZATION WITH CORONARY ANGIOGRAM;  Surgeon: Jacolyn Reedy, MD;  [facility];  Service: Cardiovascular;  Laterality: N/A;  LITHOTRIPSY     "many; probably 5 times"   RADIAL ARTERY HARVEST  04/30/2011   Procedure: RADIAL ARTERY HARVEST;  Surgeon: Melrose Nakayama, MD;  Location: [facility];  Service: Open Heart Surgery;  Laterality: Left;   RETINAL DETACHMENT SURGERY  1990's   right eye; "made me have an early cataract"   TEE WITHOUT CARDIOVERSION N/A 06/17/2020   Procedure: TRANSESOPHAGEAL ECHOCARDIOGRAM (TEE);  Surgeon: Lelon Perla, MD;  Location[facility];  Service: Cardiovascular;  Laterality: N/A;   VASECTOMY      There were no vitals  filed for this visit.   Subjective Assessment - 10/01/20 1235     Subjective Got the quad tip and has been using it at home - going well. No falls. Still using RW outdoors.    Patient is accompained by: Family member    Pertinent History achilles tendon tear on L    Limitations Standing;Sitting;House hold activities    How long can you stand comfortably? No issues    How long can you walk comfortably? In house only    Patient Stated Goals Return to their own home, play drums again    Currently in Pain? Yes    Pain Score 2     Pain Location Shoulder    Pain Orientation Right    Pain Descriptors / Indicators Aching;Dull    Pain Type Acute pain    Pain Onset More than a month ago    Aggravating Factors  movement    Pain Relieving Factors rest, reposition                               [facility] Adult PT Treatment/Exercise - 10/01/20 1248       Ambulation/Gait   Ambulation/Gait Yes    Ambulation/Gait Assistance 4: Min guard;5: Supervision    Ambulation/Gait Assistance Details performed x2 laps with SPC with quad tip with supervision, x2 laps with pt performing head turns with pt needing min guard for balance (esp with L). min guard needed towards end of bout due to RLE fatigue. discussed with pt importance of use of R AFO when using cane for safety and esp when RLE is fatigued    Ambulation Distance (Feet) 460 Feet   x1   Assistive device Straight cane   with 4 prong tip   Gait Pattern Step-through pattern;Decreased stance time - right;Decreased dorsiflexion - right;Decreased weight shift to right;Right foot flat    Ambulation Surface Level;Indoor    Stairs Yes    Stairs Assistance 4: Min guard;5: Supervision    Stairs Assistance Details (indicate cue type and reason) pt asking about ways to perform steps if railing is on his L side, practiced using cane in RUE and railing on L with cues for step to pattern and cane placement. practiced descending with railing then on R  side and cane in LUE. also practiced descending if railing were to be on L side when descending (using sideways technique and step to) and educated that pt's wife can hold his cane when performing with pt reporting feeling comfortable performing it this way    Stair Management Technique Sideways;One rail Left;Step to pattern;With cane    Number of Stairs 4   x3 reps   Gait Comments pt asking about proper height of SPC with quad tip (now that he has one at home) - explained and demonstrated to pt proper height for improved positioning/posture as pt has  had his set too high at home as well as explained to pt's wife at end of sesison. pt reporting this height felt a lot better                 Balance Exercises - 10/01/20 1250       Balance Exercises: Standing   Standing Eyes Opened Head turns;Foam/compliant surface;Limitations;2 reps    Standing Eyes Opened Limitations feet hip width distance, 2 x 10 reps head turns, 2 x 10 reps head nods - intermittent taps to walls for balance    Standing Eyes Closed Foam/compliant surface;3 reps;30 secs;Limitations    Standing Eyes Closed Limitations on blue air ex, 3 x 30 seconds    SLS with Vectors Intermittent upper extremity assist;Solid surface;Limitations    SLS with Vectors Limitations in corner: x15 reps standing on RLE and forward cone tap with LLE for SLS, beginning with UE support and progressing to none with min guard for balance, then x10 reps RLE hip flexion with no UE support               PT Education - 10/01/20 1317     Education Details stair training, importance of wearing R AFO during gait at home especially when more fatigued when walking with cane, ambulating with cane only in the house (not ready for outdoor distances), proper height of cane    Person(s) Educated Patient;Spouse    Methods Explanation;Demonstration    Comprehension Verbalized understanding;Returned demonstration              PT Short Term Goals -  09/17/20 1155       PT SHORT TERM GOAL #1   Title Pt will be independent with initial HEP    Baseline pt reports compliance (09/17/20)    Time 4    Period Weeks    Status Achieved    Target Date 09/17/20      PT SHORT TERM GOAL #2   Title Pt will be able to demo at least 3/5 R ankle DF to play on his drums    Baseline 3+/5 on R ankle DF (09/17/20)    Time 4    Period Weeks    Status Achieved    Target Date 09/17/20      PT SHORT TERM GOAL #3   Title Pt will be able to walk >500' with LRAD and SBA for improved home/community mobility    Baseline 545' with st. cane (quad tip) with CGA    Time 4    Period Weeks    Status On-going    Target Date 09/17/20      PT SHORT TERM GOAL #4   Title Pt will be able to ascend/descend at least 20 steps with railing and SBA for safe return home    Baseline Performed 4 steps with bilat rails and min A; performed 20 steps with one rail and st. cane on contralateral side with SBA (09/17/20)    Time 4    Period Weeks    Status Achieved    Target Date 09/17/20               PT Long Term Goals - 08/20/20 1703       PT LONG TERM GOAL #1   Title Pt will be independent with advanced HEP for strengthening and balance    Time 8    Period Weeks    Status New    Target Date 10/15/20      PT  LONG TERM GOAL #2   Title Pt will have improved Berg Balance Score of at least 45/56 to indicate decreased fall risk    Baseline 39/56    Time 8    Period Weeks    Status New    Target Date 10/15/20      PT LONG TERM GOAL #3   Title Pt will have improved DGI score to at least 19/24 to demo decreased fall risk    Baseline 17/24    Time 8    Period Weeks    Status New    Target Date 10/15/20      PT LONG TERM GOAL #4   Title Pt will be able to amb at least 1000' with LRAD mod I on level and unlevel surfaces for community amb    Baseline Needs use of RW    Time 8    Period Weeks    Status New    Target Date 10/15/20      PT LONG TERM GOAL #5    Title Pt will be able to use R foot/ankle for drumming to return to leisure activities    Time 8    Period Weeks    Status New    Target Date 10/15/20                   Plan - 10/01/20 1326     Clinical Impression Statement Continued to work on gait training with [place] with quad tip over level surfaces, pt needing min guard when adding in dynamic task of head turns and scanning environment for balance. Pt's RLE fatigued after 460' of gait with SPC with quad tip. Educated and demonstrated proper cane height for pt as pt had set his at home too high. Will continue to progress towards LTGs.    Personal Factors and Comorbidities Age;Fitness;Time since onset of injury/illness/exacerbation;Comorbidity 1;Past/Current Experience    Comorbidities L Achilles tendon tear, chronic R LE edema, CAD s/p CABG, HTN, asthma, type 2 diabetes, CKD stage III, HLD, remote R retinal detachment, HTN, CVA.    Examination-Activity Limitations Squat;Stairs;Caring for Others;Locomotion Level    Examination-Participation Restrictions Cleaning;Community Activity;Driving;Occupation;Shop;Yard Work    [employer] Evolving/Moderate complexity    Rehab Potential Good    PT Frequency 2x / week    PT Duration 8 weeks    PT Treatment/Interventions ADLs/Self Care Home Management;Aquatic Therapy;Cryotherapy;Electrical Stimulation;Moist Heat;DME Instruction;Gait training;Stair training;Functional mobility training;Therapeutic activities;Therapeutic exercise;Balance training;Neuromuscular re-education;Manual techniques;Patient/family education;Passive range of motion;Taping;Joint Manipulations    PT Next Visit Plan *needs 10th visit PN. Continue gait training with SPC with quad tip - dynamic tasks. continue stair training. balance with head motions. Continue to progress R LE strengthening (and working on active DF/PF) Work on R LE coordination/control.  Hamstring Strengthening activities.    PT Home  Exercise Plan Access Code: [email or web address]; Walking program (09/17/20)- pt instructions    Consulted and Agree with Plan of Care Patient;Family member/caregiver    Family Member Consulted Wife             Patient will benefit from skilled therapeutic intervention in order to improve the following deficits and impairments:  Abnormal gait, Decreased range of motion, Difficulty walking, Increased fascial restricitons, Impaired tone, Decreased activity tolerance, Decreased balance, Decreased mobility, Decreased strength, Increased edema  Visit Diagnosis: Muscle weakness (generalized)  Other lack of coordination  Other abnormalities of gait and mobility     Problem List Patient Active Problem List   Diagnosis Date Noted  Left pontine stroke ([facility]) 07/25/2020   Acute right hemiparesis ([place]) 07/25/2020   Essential hypertension    History of CVA (cerebrovascular accident)    CKD (chronic kidney disease), stage II    Diabetes mellitus type 2 in obese ([facility])    Dyslipidemia    CVA (cerebral vascular accident) ([facility]) 07/21/2020   Cryptogenic stroke ([place]) 06/18/2020   Acute-on-chronic kidney injury ([place]) 05/28/2020   Stroke ([place]) 05/27/2020   Elevated troponin 01/13/2019   COVID-19 virus infection 01/13/2019   S/P CABG (coronary artery bypass graft)    CAD (coronary artery disease) 04/28/2011   Insulin dependent type 2 diabetes mellitus, controlled ([place])    Hyperlipidemia    Hypertension    Obesity (BMI 30-39.9)    Nephrolithiasis, uric acid     Arliss Journey, PT, DPT  10/01/2020, 1:28 PM  [facility] [address] [place], [place], [address] Phone: [phone]   Fax:  [phone]  Name: GRANTLY SHAUT MRN: [id number] Date of Birth: 07-24-1950

## 2020-10-01 NOTE — Therapy (Signed)
[facility] [address] [place], [place], [address] Phone: [phone]   Fax:  [phone]  Occupational Therapy Treatment and Progress Update  Patient Details  Name: Mitchell Lambert MRN: [id number] Date of Birth: [birth year] Referring Provider (OT): Dr. Leonie Man  This progress report covers dates of service from 08/20/20-10/01/20  Encounter Date: 10/01/2020   OT End of Session - 10/01/20 1541     Visit Number 9    Number of Visits 24    Date for OT Re-Evaluation 11/12/20    Authorization Type Medicare    Authorization - Visit Number 9    Authorization - Number of Visits 10    Progress Note Due on Visit 10    OT Start Time 1145    OT Stop Time 1230    OT Time Calculation (min) 45 min    Activity Tolerance Patient tolerated treatment well    Behavior During Therapy WFL for tasks assessed/performed             Past Medical History:  Diagnosis Date   Angina    NO ANGINA SINCE CABG   Arthritis    right ankle   Chronic daily headache    "@ least every other day""improved since heart surgery"   Coronary artery disease    PT [place] CABG SURGERY - DR. TILLEY IS HIS CARDIOLOGIST   Diabetes mellitus    Lantus x 10 yrs   Gout    History of kidney stones 09-08-12   multiple times with some lithotripsies   Hyperlipemia    Hypertension     Past Surgical History:  Procedure Laterality Date   BACK SURGERY     microsurgery: spinal stenosis -lumbar   bone spur  1990's   right great toe; "probably related to gout"   BUBBLE STUDY  06/17/2020   Procedure: BUBBLE STUDY;  Surgeon: Lelon Perla, MD;  Location: [facility];  Service: Cardiovascular;;   CARDIAC CATHETERIZATION  04/27/11   CATARACT EXTRACTION W/ INTRAOCULAR LENS IMPLANT  1990's   right; "lens was replaced twice over 3 months; lens is actually over iris"   CORONARY ARTERY BYPASS GRAFT  04/30/2011   Procedure: CORONARY ARTERY BYPASS  GRAFTING (CABG);  Surgeon: Melrose Nakayama, MD;  Location: [facility];  Service: Open Heart Surgery;  Laterality: N/A;,x4 vessels   CYSTOSCOPY W/ URETERAL STENT PLACEMENT Right 08/19/2012   Procedure: CYSTOSCOPY WITH RETROGRADE PYELOGRAM/URETERAL STENT PLACEMENT;  Surgeon: Molli Hazard, MD;  [facility];  Service: Urology;  Laterality: Right;   CYSTOSCOPY WITH RETROGRADE PYELOGRAM, URETEROSCOPY AND STENT PLACEMENT Left 07/17/2013   Procedure: CYSTOSCOPY, LEFT URETEROSCOPY, BASKET EXTRACTION OF STONE, INSERTION OF LEFT URETERAL STENT, ;  Surgeon: Jorja Loa, MD;  [facility];  Service: Urology;  Laterality: Left;   CYSTOSCOPY/RETROGRADE/URETEROSCOPY Right 09/12/2012   Procedure: CYSTOSCOPY/RETROGRADE/URETEROSCOPY;  Surgeon: Franchot Gallo, MD;  [facility];  Service: Urology;  Laterality: Right;   HOLMIUM LASER APPLICATION Right 2/53/6644   Procedure: HOLMIUM LASER APPLICATION;  Surgeon: Franchot Gallo, MD;  [facility];  Service: Urology;  Laterality: Right;   HOLMIUM LASER APPLICATION Left 0/34/7425   Procedure: HOLMIUM LASER APPLICATION;  Surgeon: Jorja Loa, MD;  [facility];  Service: Urology;  Laterality: Left;   KIDNEY STONE SURGERY     "probably 3 times"   LEFT HEART CATHETERIZATION WITH CORONARY ANGIOGRAM N/A 04/28/2011   Procedure: LEFT HEART CATHETERIZATION WITH CORONARY ANGIOGRAM;  Surgeon: Jacolyn Reedy,  MD;  Location: [facility];  Service: Cardiovascular;  Laterality: N/A;   LITHOTRIPSY     "many; probably 5 times"   RADIAL ARTERY HARVEST  04/30/2011   Procedure: RADIAL ARTERY HARVEST;  Surgeon: Melrose Nakayama, MD;  Location: [facility];  Service: Open Heart Surgery;  Laterality: Left;   RETINAL DETACHMENT SURGERY  1990's   right eye; "made me have an early cataract"   TEE WITHOUT CARDIOVERSION N/A 06/17/2020   Procedure: TRANSESOPHAGEAL ECHOCARDIOGRAM (TEE);  Surgeon: Lelon Perla, MD;  Location[facility];  Service:  Cardiovascular;  Laterality: N/A;   VASECTOMY      There were no vitals filed for this visit.   Subjective Assessment - 10/01/20 1533     Subjective  Patient reports pain at night in right shoulder up to 8/10 at night    Pertinent History 70 y/o male presented to  ED for concern of dizziness and diplopia on 07/21/20. MRI showed acute L pontine CVA and prior L parietal infract.  PMH: CAD s/p CABG, HTN, asthma, type 2 diabetes, CKD stage III, HLD, remote R retinal detachment, HTN, CVA.    Currently in Pain? Yes    Pain Score 2     Pain Location Shoulder    Pain Orientation Right    Pain Descriptors / Indicators Aching;Dull    Pain Type Acute pain    Pain Radiating Towards r shoulder and arm    Pain Onset More than a month ago    Pain Frequency Intermittent    Aggravating Factors  sustained supine    Pain Relieving Factors positional change                          OT Treatments/Exercises (OP) - 10/01/20 0001       Neurological Re-education Exercises   Other Exercises 1 Neuromuscular reeducation to address postural control as related to right shoulder pain.  Patient had appointment with physiatrist today - needed to reschedule.  Will reach out to MD regarding medical management options for shoulder pain.    Other Exercises 2 Working on active weight shift to right side - working for active lengthening in right trunk and active shortening with weight shift left.  Patient with some increase in tension toward internal rotation with initial attempts at active reach.  With cueing and facilitation, able to correct.  Worked in standing - active standing - cueing and facilitation to prevent locking knee.  Single limb stance to address RLE strength, alignment, control.  Holding ball bilaterally when walking - working on cadence of steps, and BUE function - tossing, catching, clapping.  Patient without pain with low reach movements this session.  Patient to start in pool 1x/week for OT  next week.                    OT Education - 10/01/20 1542     Education Details Discussed supporting arm with pillows at night, sleeping on wedge, or in recliner to prevent night pain.  Discussed potential medical options    Person(s) Educated Patient    Methods Explanation    Comprehension Verbalized understanding              OT Short Term Goals - 10/01/20 1216       OT SHORT TERM GOAL #1   Title I with HEP for vision, coordination    Time 4    Period Weeks    Status  Achieved    Target Date 09/17/20      OT SHORT TERM GOAL #2   Title Pt will demonstrate improved RUE fine motor coordination for ADLS as evidenced by decreasing 9 hole peg test score to 52 secs or less    Time 4    Period Weeks    Status Achieved   09/26/20 - 50 sec 1st trial; 34 sec 2nd trial     OT SHORT TERM GOAL #3   Title Pt will report that he is cutting food independently and  feeding himself with RUE at least 75% of the time.    Time 4    Period Weeks    Status Achieved      OT SHORT TERM GOAL #4   Title Pt will perfrom all basic ADLS with distant supervision.    Time 4    Period Weeks    Status Achieved      OT SHORT TERM GOAL #5   Title Pt will demonstrate ability to retrieve a lightweight object at 110* shoulder flexion with good control and min compensation.    Time 4    Period Weeks    Status Achieved   120     OT SHORT TERM GOAL #6   Title Pt will increase RUE grip strength to 45 lbs or greater for increased functional use.    Time 4    Period Weeks    Status Achieved   56.4 lbs     OT SHORT TERM GOAL #7   Title Pt will verbalzie understanding of compensatroy strategies for visual deficits    Time 4    Period Weeks    Status Achieved               OT Long Term Goals - 10/01/20 1546       OT LONG TERM GOAL #1   Title I with all basic ADLS.    Time 12    Period Weeks    Status On-going      OT LONG TERM GOAL #2   Title Pt will demonstrate improved  fine motor coordination for ADLs as evidenced by performing 9 hole peg test in 45 secs or less.    Time 12    Period Weeks    Status On-going      OT LONG TERM GOAL #3   Title Pt will perform simple snack/ beverage prep modified Independently.    Time 12    Period Weeks    Status On-going      OT LONG TERM GOAL #4   Title Pt will retrieve a 3 lbs object with RUE at 115 shoulder flexion with good control.    Time 12    Period Weeks    Status On-going      OT LONG TERM GOAL #5   Title Pt will perfrom simulated work activities modified Independently    Time 12    Period Weeks    Status On-going      OT LONG TERM GOAL #6   Title Pt will perfrom tabletop and environmental scanning with 90% or better accuracy in prep for work activities.    Time 12    Period Weeks    Status On-going                   Plan - 10/01/20 1541     Clinical Impression Statement Pt has met all short term goals and is progressing toward long term goals.  Patient limited by RUE pain, especially at night.    OT Occupational Profile and History Detailed Assessment- Review of Records and additional review of physical, cognitive, psychosocial history related to current functional performance    Occupational performance deficits (Please refer to evaluation for details): ADL's;IADL's;Play;Work;Leisure;Social Participation    Body Structure / Function / Physical Skills ADL;Balance;Endurance;Mobility;Strength;Flexibility;UE functional use;FMC;Coordination;Gait;Vision;ROM;GMC;Decreased knowledge of precautions;Decreased knowledge of use of DME;Dexterity;IADL    Rehab Potential Good    Clinical Decision Making Limited treatment options, no task modification necessary    Comorbidities Affecting Occupational Performance: May have comorbidities impacting occupational performance    Modification or Assistance to Complete Evaluation  No modification of tasks or assist necessary to complete eval    OT Frequency 2x  / week    OT Duration 12 weeks    OT Treatment/Interventions Self-care/ADL training;Therapeutic exercise;Functional Mobility Training;Balance training;Ultrasound;Neuromuscular education;Manual Therapy;Therapeutic activities;Paraffin;Cryotherapy;DME and/or AE instruction;Cognitive remediation/compensation;Visual/perceptual remediation/compensation;Fluidtherapy;Moist Heat;Electrical Stimulation;Passive range of motion;Patient/family education;Energy conservation;Aquatic Therapy;Gait Training    Plan aquatic therapy, NMR, address shoulder pain and normal movement    Consulted and Agree with Plan of Care Patient             Patient will benefit from skilled therapeutic intervention in order to improve the following deficits and impairments:   Body Structure / Function / Physical Skills: ADL, Balance, Endurance, Mobility, Strength, Flexibility, UE functional use, FMC, Coordination, Gait, Vision, ROM, GMC, Decreased knowledge of precautions, Decreased knowledge of use of DME, Dexterity, IADL       Visit Diagnosis: Muscle weakness (generalized)  Other lack of coordination  Visuospatial deficit  Unsteadiness on feet    Problem List Patient Active Problem List   Diagnosis Date Noted   Left pontine stroke ([facility]) 07/25/2020   Acute right hemiparesis ([facility]) 07/25/2020   Essential hypertension    History of CVA (cerebrovascular accident)    CKD (chronic kidney disease), stage II    Diabetes mellitus type 2 in obese ([facility])    Dyslipidemia    CVA (cerebral vascular accident) ([facility]) 07/21/2020   Cryptogenic stroke ([place]) 06/18/2020   Acute-on-chronic kidney injury ([facility]) 05/28/2020   Stroke ([facility]) 05/27/2020   Elevated troponin 01/13/2019   COVID-19 virus infection 01/13/2019   S/P CABG (coronary artery bypass graft)    CAD (coronary artery disease) 04/28/2011   Insulin dependent type 2 diabetes mellitus, controlled ([place])    Hyperlipidemia    Hypertension    Obesity (BMI 30-39.9)     Nephrolithiasis, uric acid     Mariah Milling, OTR/L 10/01/2020, 3:49 PM  [facility] [address] [place], [place], [address] Phone: [phone]   Fax:  [phone]  Name: Mitchell Lambert MRN: [id number] Date of Birth: Nov 22, 1950

## 2020-10-03 ENCOUNTER — Other Ambulatory Visit: Payer: Self-pay

## 2020-10-03 ENCOUNTER — Ambulatory Visit: Payer: Medicare Other | Admitting: Physical Therapy

## 2020-10-03 ENCOUNTER — Ambulatory Visit: Payer: Medicare Other | Admitting: Occupational Therapy

## 2020-10-03 ENCOUNTER — Encounter: Payer: Self-pay | Admitting: Physical Medicine & Rehabilitation

## 2020-10-03 ENCOUNTER — Telehealth: Payer: Self-pay | Admitting: *Deleted

## 2020-10-03 ENCOUNTER — Encounter: Payer: Medicare Other | Attending: Physical Medicine & Rehabilitation | Admitting: Physical Medicine & Rehabilitation

## 2020-10-03 VITALS — BP 122/75 | HR 56 | Temp 98.7°F | Ht 73.0 in | Wt 223.0 lb

## 2020-10-03 DIAGNOSIS — I639 Cerebral infarction, unspecified: Secondary | ICD-10-CM

## 2020-10-03 DIAGNOSIS — R2689 Other abnormalities of gait and mobility: Secondary | ICD-10-CM

## 2020-10-03 DIAGNOSIS — R278 Other lack of coordination: Secondary | ICD-10-CM

## 2020-10-03 DIAGNOSIS — R2681 Unsteadiness on feet: Secondary | ICD-10-CM

## 2020-10-03 DIAGNOSIS — M7541 Impingement syndrome of right shoulder: Secondary | ICD-10-CM | POA: Insufficient documentation

## 2020-10-03 DIAGNOSIS — R41842 Visuospatial deficit: Secondary | ICD-10-CM

## 2020-10-03 DIAGNOSIS — M6281 Muscle weakness (generalized): Secondary | ICD-10-CM

## 2020-10-03 MED ORDER — CYCLOBENZAPRINE HCL 10 MG PO TABS: 10.0000 mg | ORAL_TABLET | Freq: Every day | ORAL | 0 refills | Status: DC

## 2020-10-03 NOTE — Progress Notes (Signed)
Subjective:    Patient ID: Mitchell Lambert, male    DOB: [birth year], 70 y.o.   MRN: [id number] 70 y.o. male with history of CKD 2, CAD, T2DM, CVA 04/2020 with transient right vision changes and aphasia who was placed on Plavix.  He was admitted on 07/22/2018 with left facial droop, dizziness, nausea and double vision.  MRI brain done revealing small acute infarct in left pontine medullary junction.  Patient reported that symptoms had started the day before and was out of window for tPA.     Dr. Leonie Man expressed concerns of cardioembolic stroke and recommended DAPT of aspirin and Brilinta x4 weeks followed by aspirin alone.  She he had issues with bradycardia therefore metoprolol was discontinued and BP meds held to avoid hypoperfusion.  Therapy initiated and patient was noted to have limitations due to Right ataxia, balance deficits, mild cognitive deficits and diplopia. CIR was recommended due to functional decline.  Admit date: 07/25/2020 Discharge date: 08/07/2020  Mod I dressing Needs assist with shower transfers No falls No numbness tingling No problems with swallowing just slow Asking about double vision Has seen Optometrist, referral to [facility] for diplopia   Patient has no significant pain in the ankle.  He had a gout flareup in the right foot and ankle during his rehab hospitalization.  He has 3-4 stools a week uses Metamucil.  He has no bladder issues other than incomplete emptying.  He walks using a walker.  He has difficulty climbing the steps.  He is no longer driving.  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 For ADLs he needs assistance with bathing and meal prep Social history married he has 3 children ages 28,44 and 66  Lives in a two-level home 13 steps to the bedroom.  Currently staying with a friend in a single-story home  HPI  RIght shoulder pain, night time pain, no recent   Pt with  hx of marked CBG elevation treated with steroids either injected or oral. Pain worsens with overhead reaching.  No significant neck pain.  No radiating pain from his neck down to his hand.  No swelling in the hand or arm no erythema in the hand or arm no hypersensitivity to touch in the hand or arm  Pain Inventory Average Pain 5 Pain Right Now 5 My pain is stabbing and aching  LOCATION OF PAIN  right shoulder and arm  BOWEL Number of stools per week: 5 Oral laxative use Yes  Type of laxative metamucil Enema or suppository use No  History of colostomy No  Incontinent No   BLADDER Normal Able to self cath No  Bladder incontinence No  Frequent urination Yes  Leakage with coughing No  Difficulty starting stream No  Incomplete bladder emptying Yes    Mobility walk with assistance use a cane use a walker ability to climb steps?  yes do you drive?  no  Function retired  Neuro/Psych bladder control problems  Prior Studies N/a  Physicians involved in your care N/a   Family History  Problem Relation Age of Onset   Cataracts Father    Stroke Father    Cataracts Mother    Heart disease Mother    Diabetes Paternal Aunt    Diabetes Paternal Grandmother    Amblyopia Neg Hx    Blindness Neg Hx    Glaucoma Neg Hx  Macular degeneration Neg Hx    Retinal detachment Neg Hx    Strabismus Neg Hx    Retinitis pigmentosa Neg Hx    Social History   Socioeconomic History   Marital status: Married    Spouse name: [name]   Number of children: Not on file   Years of education: Not on file   Highest education level: Not on file  Occupational History   Not on file  Tobacco Use   Smoking status: Never   Smokeless tobacco: Never  Vaping Use   Vaping Use: Never used  Substance and Sexual Activity   Alcohol use: Yes    Comment:  OCCAS BEER  OR MIXED DRINK LAST MARIJUANA COUPLE MONTHS AGO   Drug use: Yes    Types: Marijuana    Comment: last marijuana use ~ 2010;  "very rare". since   Sexual activity: Yes  Other Topics Concern   Not on file  Social History Narrative   Lives with wife   Right Handed   Drinks soda every other day   Social Determinants of Health   Financial Resource Strain: Not on file  Food Insecurity: Not on file  Transportation Needs: Not on file  Physical Activity: Not on file  Stress: Not on file  Social Connections: Not on file   Past Surgical History:  Procedure Laterality Date   BACK SURGERY     microsurgery: spinal stenosis -lumbar   bone spur  1990's   right great toe; "probably related to gout"   BUBBLE STUDY  06/17/2020   Procedure: BUBBLE STUDY;  Surgeon: Lelon Perla, MD;  Location: [facility];  Service: Cardiovascular;;   CARDIAC CATHETERIZATION  04/27/11   CATARACT EXTRACTION W/ INTRAOCULAR LENS IMPLANT  1990's   right; "lens was replaced twice over 3 months; lens is actually over iris"   CORONARY ARTERY BYPASS GRAFT  04/30/2011   Procedure: CORONARY ARTERY BYPASS GRAFTING (CABG);  Surgeon: Melrose Nakayama, MD;  Location: [facility];  Service: Open Heart Surgery;  Laterality: N/A;,x4 vessels   CYSTOSCOPY W/ URETERAL STENT PLACEMENT Right 08/19/2012   Procedure: CYSTOSCOPY WITH RETROGRADE PYELOGRAM/URETERAL STENT PLACEMENT;  Surgeon: Molli Hazard, MD;  [facility];  Service: Urology;  Laterality: Right;   CYSTOSCOPY WITH RETROGRADE PYELOGRAM, URETEROSCOPY AND STENT PLACEMENT Left 07/17/2013   Procedure: CYSTOSCOPY, LEFT URETEROSCOPY, BASKET EXTRACTION OF STONE, INSERTION OF LEFT URETERAL STENT, ;  Surgeon: Jorja Loa, MD;  [facility];  Service: Urology;  Laterality: Left;   CYSTOSCOPY/RETROGRADE/URETEROSCOPY Right 09/12/2012   Procedure: CYSTOSCOPY/RETROGRADE/URETEROSCOPY;  Surgeon: Franchot Gallo, MD;  [facility];  Service: Urology;  Laterality: Right;   HOLMIUM LASER APPLICATION Right [id number]   Procedure: HOLMIUM LASER APPLICATION;  Surgeon: Franchot Gallo, MD;   [facility];  Service: Urology;  Laterality: Right;   HOLMIUM LASER APPLICATION Left [id number]   Procedure: HOLMIUM LASER APPLICATION;  Surgeon: Jorja Loa, MD;  [facility];  Service: Urology;  Laterality: Left;   KIDNEY STONE SURGERY     "probably 3 times"   LEFT HEART CATHETERIZATION WITH CORONARY ANGIOGRAM N/A 04/28/2011   Procedure: LEFT HEART CATHETERIZATION WITH CORONARY ANGIOGRAM;  Surgeon: Jacolyn Reedy, MD;  Location: [facility];  Service: Cardiovascular;  Laterality: N/A;   LITHOTRIPSY     "many; probably 5 times"   RADIAL ARTERY HARVEST  04/30/2011   Procedure: RADIAL ARTERY HARVEST;  Surgeon: Melrose Nakayama, MD;  Location: [facility];  Service: Open Heart Surgery;  Laterality: Left;  RETINAL DETACHMENT SURGERY  1990's   right eye; "made me have an early cataract"   TEE WITHOUT CARDIOVERSION N/A 06/17/2020   Procedure: TRANSESOPHAGEAL ECHOCARDIOGRAM (TEE);  Surgeon: Lelon Perla, MD;  Location: [facility];  Service: Cardiovascular;  Laterality: N/A;   VASECTOMY     Past Medical History:  Diagnosis Date   Angina    NO ANGINA SINCE CABG   Arthritis    right ankle   Chronic daily headache    "@ least every other day""improved since heart surgery"   Coronary artery disease    PT [facility] CABG SURGERY - DR. TILLEY IS HIS CARDIOLOGIST   Diabetes mellitus    Lantus x 10 yrs   Gout    History of kidney stones 09-08-12   multiple times with some lithotripsies   Hyperlipemia    Hypertension    BP 122/75 (BP Location: Right Arm)   Pulse (!) 56   Temp 98.7 F (37.1 C) (Oral)   Ht '6\' 1"'$  (1.854 m)   Wt 223 lb (101.2 kg)   SpO2 97%   BMI 29.42 kg/m   Opioid Risk Score:   Fall Risk Score:  `1  Depression screen PHQ 2/9  Depression screen [facility] 2/9 08/20/2020 03/31/2015 06/17/2011  Decreased Interest 0 0 0  Down, Depressed, Hopeless 0 0 0  PHQ - 2 Score 0 0 0  Altered sleeping 1 - -  Tired, decreased energy 1 - -  Change  in appetite 0 - -  Feeling bad or failure about yourself  0 - -  Trouble concentrating 0 - -  Moving slowly or fidgety/restless 0 - -  Suicidal thoughts 0 - -  PHQ-9 Score 2 - -      Review of Systems  Constitutional: Negative.   HENT: Negative.    Eyes: Negative.   Respiratory: Negative.    Cardiovascular: Negative.   Gastrointestinal: Negative.   Endocrine: Negative.   Genitourinary:  Positive for frequency.       Urine frequency at night only  Musculoskeletal:  Positive for gait problem.       Pain in right shoulder ,   Skin: Negative.   Allergic/Immunologic: Negative.   Hematological: Negative.   Psychiatric/Behavioral: Negative.        Objective:   Physical Exam Vitals and nursing note reviewed.  Constitutional:      Appearance: He is obese.  HENT:     Head: Normocephalic and atraumatic.  Eyes:     Extraocular Movements: Extraocular movements intact.     Pupils: Pupils are equal, round, and reactive to light.  Musculoskeletal:     Comments: Positive [place] Tenderness over the right greater than left acromioclavicular joint Pain with passive adduction No pain or limitation with external rotation There is no swelling of the right hand no pain with MCP compression no pain with hand or wrist range of motion or elbow range of motion. No pain with cervical spine range of motion  Skin:    General: Skin is warm.  Neurological:     Mental Status: He is alert and oriented to person, place, and time.     Motor: No abnormal muscle tone.     Coordination: Coordination abnormal. Finger-Nose-Finger Test abnormal.     Gait: Gait abnormal.     Comments: Aphasic, expressive Left cranial nerve VI palsy with diplopia with left lateral gaze  Ambulates with a walker and a right AFO no evidence of knee instability Motor  strength is 3/5 in the right deltoid 4 at the bicep tricep finger flexors and extensors 4 at the right hip flexor and knee extensor 3 - ankle  dorsiflexor Sensation is reported as equal between upper and lower limbs to light touch.   Psychiatric:        Mood and Affect: Mood normal.          Assessment & Plan:  1.  History of left MCA distribution infarct in March 2022 with superimposed left pontomedullary junction infarct in May 2022.  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  Hemiplegic shoulder pain, we discussed that this is a combination of subacromial impingement with poor muscle strength as well as decreased muscular coordination in the right parascapular area. We discussed potential benefit of subacromial steroid injection, the patient is unsure about this procedure due to prior difficulty controlling blood sugar after a similar procedure. We discussed continued PT OT We discussed the potential role for musculoskeletal ultrasound to look for tear although we also discussed that he would be a poor surgical candidate for rotator cuff repair due to the stroke risk in the perioperative time as well as difficulty with rehabilitation due to poor motor control in the right upper extremity.

## 2020-10-03 NOTE — Telephone Encounter (Signed)
Mitchell Lambert  Key: [id number]   PA Case ID: [id number]   Rx #: [id number]  Sent to plan

## 2020-10-03 NOTE — Telephone Encounter (Signed)
Approved.  

## 2020-10-03 NOTE — Therapy (Signed)
[facility] [address] [place], [place], [address] Phone: [phone]   Fax:  [phone]  Physical Therapy Treatment/10th Visit Progress Note  Patient Details  Name: Mitchell Lambert MRN: [id number] Date of Birth: February 23, 1951 Referring Provider (PT): Garvin Fila, MD   10th Visit Physical Therapy Progress Note  Dates of Reporting Period: 08/20/20 to 10/03/20    Encounter Date: 10/03/2020   PT End of Session - 10/03/20 1158     Visit Number 10    Number of Visits 17    Date for PT Re-Evaluation 10/15/20    Authorization Type Medicare - will need 10th visit PN    Progress Note Due on Visit 10    PT Start Time 1104    PT Stop Time 1145    PT Time Calculation (min) 41 min    Equipment Utilized During Treatment Gait belt    Activity Tolerance Patient tolerated treatment well    Behavior During Therapy WFL for tasks assessed/performed             Past Medical History:  Diagnosis Date   Angina    NO ANGINA SINCE CABG   Arthritis    right ankle   Chronic daily headache    "@ least every other day""improved since heart surgery"   Coronary artery disease    PT [place] CABG SURGERY - DR. TILLEY IS HIS CARDIOLOGIST   Diabetes mellitus    Lantus x 10 yrs   Gout    History of kidney stones 09-08-12   multiple times with some lithotripsies   Hyperlipemia    Hypertension     Past Surgical History:  Procedure Laterality Date   BACK SURGERY     microsurgery: spinal stenosis -lumbar   bone spur  1990's   right great toe; "probably related to gout"   BUBBLE STUDY  06/17/2020   Procedure: BUBBLE STUDY;  Surgeon: Lelon Perla, MD;  Location: [facility];  Service: Cardiovascular;;   CARDIAC CATHETERIZATION  04/27/11   CATARACT EXTRACTION W/ INTRAOCULAR LENS IMPLANT  1990's   right; "lens was replaced twice over 3 months; lens is actually over iris"   CORONARY ARTERY BYPASS GRAFT   04/30/2011   Procedure: CORONARY ARTERY BYPASS GRAFTING (CABG);  Surgeon: Melrose Nakayama, MD;  Location: [facility];  Service: Open Heart Surgery;  Laterality: N/A;,x4 vessels   CYSTOSCOPY W/ URETERAL STENT PLACEMENT Right 08/19/2012   Procedure: CYSTOSCOPY WITH RETROGRADE PYELOGRAM/URETERAL STENT PLACEMENT;  Surgeon: Molli Hazard, MD;  Location[facility];  Service: Urology;  Laterality: Right;   CYSTOSCOPY WITH RETROGRADE PYELOGRAM, URETEROSCOPY AND STENT PLACEMENT Left 07/17/2013   Procedure: CYSTOSCOPY, LEFT URETEROSCOPY, BASKET EXTRACTION OF STONE, INSERTION OF LEFT URETERAL STENT, ;  Surgeon: Jorja Loa, MD;  [facility];  Service: Urology;  Laterality: Left;   CYSTOSCOPY/RETROGRADE/URETEROSCOPY Right 09/12/2012   Procedure: CYSTOSCOPY/RETROGRADE/URETEROSCOPY;  Surgeon: Franchot Gallo, MD;  [facility];  Service: Urology;  Laterality: Right;   HOLMIUM LASER APPLICATION Right [id number]   Procedure: HOLMIUM LASER APPLICATION;  Surgeon: Franchot Gallo, MD;  [facility];  Service: Urology;  Laterality: Right;   HOLMIUM LASER APPLICATION Left [id number]   Procedure: HOLMIUM LASER APPLICATION;  Surgeon: Jorja Loa, MD;  [facility];  Service: Urology;  Laterality: Left;   KIDNEY STONE SURGERY     "probably 3 times"   LEFT HEART CATHETERIZATION WITH CORONARY ANGIOGRAM N/A 04/28/2011   Procedure: LEFT HEART CATHETERIZATION  WITH CORONARY ANGIOGRAM;  Surgeon: Jacolyn Reedy, MD;  Location[facility] C[facility] LAB;  Service: Cardiovascular;  Laterality: N/A;   LITHOTRIPSY     "many; probably 5 times"   RADIAL ARTERY HARVEST  04/30/2011   Procedure: RADIAL ARTERY HARVEST;  Surgeon: Melrose Nakayama, MD;  Location: [facility];  Service: Open Heart Surgery;  Laterality: Left;   RETINAL DETACHMENT SURGERY  1990's   right eye; "made me have an early cataract"   TEE WITHOUT CARDIOVERSION N/A 06/17/2020   Procedure: TRANSESOPHAGEAL ECHOCARDIOGRAM (TEE);  Surgeon: Lelon Perla, MD;  [facility]: [facility] ENDOSCOPY;  Service: Cardiovascular;  Laterality: N/A;   VASECTOMY      There were no vitals filed for this visit.       [facility] PT Assessment - 10/03/20 1112       Berg Balance Test   Sit to Stand Able to stand without using hands and stabilize independently    Standing Unsupported Able to stand safely 2 minutes    Sitting with Back Unsupported but Feet Supported on Floor or Stool Able to sit safely and securely 2 minutes    Stand to Sit Sits safely with minimal use of hands    Transfers Able to transfer safely, minor use of hands    Standing Unsupported with Eyes Closed Able to stand 10 seconds safely    Standing Unsupported with Feet Together Able to place feet together independently and stand 1 minute safely    From Standing, Reach Forward with Outstretched Arm Can reach confidently >25 cm (10")    From Standing Position, Pick up Object from Floor Able to pick up shoe safely and easily    From Standing Position, Turn to Look Behind Over each Shoulder Looks behind from both sides and weight shifts well    Turn 360 Degrees Able to turn 360 degrees safely but slowly   6.25 to R, 7.16 to L (incr dizziness)   Standing Unsupported, Alternately Place Feet on Step/Stool Able to complete >2 steps/needs minimal assist    Standing Unsupported, One Foot in Front Able to plae foot ahead of the other independently and hold 30 seconds    Standing on One Leg Able to lift leg independently and hold 5-10 seconds   standing on LLE   Total Score 49    Berg comment: 49/56 = moderate fall risk                           [facility] Adult PT Treatment/Exercise - 10/03/20 1131       Ambulation/Gait   Ambulation/Gait Yes    Ambulation/Gait Assistance 4: Min guard    Ambulation/Gait Assistance Details performed gait with cane with scanning environment and ambulating over red/blue mats for a compliant surface. pt with improvement in head motions today with balance.  cues to step up with RLE on mat to avoid it getting stuck    Ambulation Distance (Feet) 345 Feet    Assistive device Straight cane   with 4 prong tip   Gait Pattern Step-through pattern;Decreased stance time - right;Decreased dorsiflexion - right;Decreased weight shift to right;Right foot flat    Ambulation Surface Level;Indoor                 Balance Exercises - 10/03/20 1142       Balance Exercises: Standing   SLS with Vectors Intermittent upper extremity assist;1 rep;Limitations    SLS with Vectors Limitations x12 reps with standing  on RLE for SLS, tapping LLE to 6" step, cues for slowed and controlled    Standing, One Foot on a Step Eyes open;6 inch;Limitations    Standing, One Foot on a Step Limitations standing on RLE, 2 x 5 reps head turns, x5 reps head nods - pt reporting incr dizziness wiht head nods    Step Ups Forward;6 inch;UE support 1;Limitations    Step Ups Limitations x10 reps leading with RLE, tactile and visual cue on where to target RLE when stepping up to avoid circumduction    Retro Gait 4 reps;Limitations    Retro Gait Limitations down and back 4 reps without UE support, pt able to demo improved step length and stance time on RLE               PT Education - 10/03/20 1157     Education Details discussed POC and progress with therapy and will need to schedule additional visits    Person(s) Educated Patient    Methods Explanation    Comprehension Verbalized understanding              PT Short Term Goals - 09/17/20 1155       PT SHORT TERM GOAL #1   Title Pt will be independent with initial HEP    Baseline pt reports compliance (09/17/20)    Time 4    Period Weeks    Status Achieved    Target Date 09/17/20      PT SHORT TERM GOAL #2   Title Pt will be able to demo at least 3/5 R ankle DF to play on his drums    Baseline 3+/5 on R ankle DF (09/17/20)    Time 4    Period Weeks    Status Achieved    Target Date 09/17/20      PT SHORT  TERM GOAL #3   Title Pt will be able to walk >500' with LRAD and SBA for improved home/community mobility    Baseline 545' with st. cane (quad tip) with CGA    Time 4    Period Weeks    Status On-going    Target Date 09/17/20      PT SHORT TERM GOAL #4   Title Pt will be able to ascend/descend at least 20 steps with railing and SBA for safe return home    Baseline Performed 4 steps with bilat rails and min A; performed 20 steps with one rail and st. cane on contralateral side with SBA (09/17/20)    Time 4    Period Weeks    Status Achieved    Target Date 09/17/20               PT Long Term Goals - 10/03/20 1125       PT LONG TERM GOAL #1   Title Pt will be independent with advanced HEP for strengthening and balance    Time 8    Period Weeks    Status New      PT LONG TERM GOAL #2   Title Pt will have improved Berg Balance Score of at least 45/56 to indicate decreased fall risk    Baseline 39/56; 49/56 on 10/03/20    Time 8    Period Weeks    Status Achieved      PT LONG TERM GOAL #3   Title Pt will have improved DGI score to at least 19/24 to demo decreased fall risk    Baseline 17/24  Time 8    Period Weeks    Status New      PT LONG TERM GOAL #4   Title Pt will be able to amb at least 1000' with LRAD mod I on level and unlevel surfaces for community amb    Baseline Needs use of RW    Time 8    Period Weeks    Status New      PT LONG TERM GOAL #5   Title Pt will be able to use R foot/ankle for drumming to return to leisure activities    Time 8    Period Weeks    Status New                   Plan - 10/03/20 1200     Clinical Impression Statement 10th visit progress note: Assessed BERG today with pt scoring a 49/56 (improved from a 39/56), now putting pt at a moderate risk of falls. Pt has progressed to using a SPC with quad time for household distances. Pt with improvement of gait with head motions today with cane, needing min guard. Pt continues  to report some dizziness with head motions that improves with rest. Will continue to progress towards LTGs.    Personal Factors and Comorbidities Age;Fitness;Time since onset of injury/illness/exacerbation;Comorbidity 1;Past/Current Experience    Comorbidities L Achilles tendon tear, chronic R LE edema, CAD s/p CABG, HTN, asthma, type 2 diabetes, CKD stage III, HLD, remote R retinal detachment, HTN, CVA.    Examination-Activity Limitations Squat;Stairs;Caring for Others;Locomotion Level    Examination-Participation Restrictions Cleaning;Community Activity;Driving;Occupation;Shop;Yard Work    [employer] Evolving/Moderate complexity    Rehab Potential Good    PT Frequency 2x / week    PT Duration 8 weeks    PT Treatment/Interventions ADLs/Self Care Home Management;Aquatic Therapy;Cryotherapy;Electrical Stimulation;Moist Heat;DME Instruction;Gait training;Stair training;Functional mobility training;Therapeutic activities;Therapeutic exercise;Balance training;Neuromuscular re-education;Manual techniques;Patient/family education;Passive range of motion;Taping;Joint Manipulations    PT Next Visit Plan . Continue gait training with SPC with quad tip - dynamic tasks. continue stair training. balance with head motions. Continue to progress R LE strengthening (and working on active DF/PF) Work on R LE coordination/control.  Hamstring Strengthening activities.    PT Home Exercise Plan Access Code: [email or web address]; Walking program (09/17/20)- pt instructions    Consulted and Agree with Plan of Care Patient;Family member/caregiver    Family Member Consulted Wife             Patient will benefit from skilled therapeutic intervention in order to improve the following deficits and impairments:  Abnormal gait, Decreased range of motion, Difficulty walking, Increased fascial restricitons, Impaired tone, Decreased activity tolerance, Decreased balance, Decreased mobility, Decreased strength,  Increased edema  Visit Diagnosis: Muscle weakness (generalized)  Unsteadiness on feet  Other abnormalities of gait and mobility     Problem List Patient Active Problem List   Diagnosis Date Noted   Left pontine stroke ([place] [facility]) 07/25/2020   Acute right hemiparesis ([facility]) 07/25/2020   Essential hypertension    History of CVA (cerebrovascular accident)    CKD (chronic kidney disease), stage II    Diabetes mellitus type 2 in obese (HCC)    Dyslipidemia    CVA (cerebral vascular accident) ([place]) 07/21/2020   Cryptogenic stroke ([place]) 06/18/2020   Acute-on-chronic kidney injury ([place]) 05/28/2020   Stroke ([place]) 05/27/2020   Elevated troponin 01/13/2019   COVID-19 virus infection 01/13/2019   S/P CABG (coronary artery bypass graft)    CAD (coronary artery disease) 04/28/2011  Insulin dependent type 2 diabetes mellitus, controlled (HCC)    Hyperlipidemia    Hypertension    Obesity (BMI 30-39.9)    Nephrolithiasis, uric acid     Arliss Journey, PT, DPT  10/03/2020, 12:02 PM  [facility] [address] [place], [place], [address] Phone: [phone]   Fax:  [phone]  Name: Mitchell Lambert MRN: [id number] Date of Birth: 05/01/1950

## 2020-10-04 NOTE — Telephone Encounter (Signed)
Approved 09/03/2020 until further notice.

## 2020-10-04 NOTE — Therapy (Signed)
[facility] [address] [place], [place], [address] Phone: [phone]   Fax:  [phone]  Occupational Therapy Treatment  Patient Details  Name: Mitchell Lambert MRN: [id number] Date of Birth: 11-07-1950 Referring Provider (OT): Dr. Leonie Man   Encounter Date: 10/03/2020   OT End of Session - 10/03/20 1317     Visit Number 10    Number of Visits 24    Date for OT Re-Evaluation 11/12/20    Authorization Type Medicare    Authorization - Visit Number 10    Authorization - Number of Visits 10    Progress Note Due on Visit 19   PN completed on visit 9   OT Start Time 1150    OT Stop Time 1230    OT Time Calculation (min) 40 min    Activity Tolerance Patient tolerated treatment well;Patient limited by pain    Behavior During Therapy WFL for tasks assessed/performed             Past Medical History:  Diagnosis Date   Angina    NO ANGINA SINCE CABG   Arthritis    right ankle   Chronic daily headache    "@ least every other day""improved since heart surgery"   Coronary artery disease    PT [place] CABG SURGERY - DR. TILLEY IS HIS CARDIOLOGIST   Diabetes mellitus    Lantus x 10 yrs   Gout    History of kidney stones 09-08-12   multiple times with some lithotripsies   Hyperlipemia    Hypertension     Past Surgical History:  Procedure Laterality Date   BACK SURGERY     microsurgery: spinal stenosis -lumbar   bone spur  1990's   right great toe; "probably related to gout"   BUBBLE STUDY  06/17/2020   Procedure: BUBBLE STUDY;  Surgeon: Lelon Perla, MD;  Location: [facility];  Service: Cardiovascular;;   CARDIAC CATHETERIZATION  04/27/11   CATARACT EXTRACTION W/ INTRAOCULAR LENS IMPLANT  1990's   right; "lens was replaced twice over 3 months; lens is actually over iris"   CORONARY ARTERY BYPASS GRAFT  04/30/2011   Procedure: CORONARY ARTERY BYPASS GRAFTING (CABG);  Surgeon: Melrose Nakayama, MD;  Location: [facility];  Service: Open Heart Surgery;  Laterality: N/A;,x4 vessels   CYSTOSCOPY W/ URETERAL STENT PLACEMENT Right 08/19/2012   Procedure: CYSTOSCOPY WITH RETROGRADE PYELOGRAM/URETERAL STENT PLACEMENT;  Surgeon: Molli Hazard, MD;  [facility];  Service: Urology;  Laterality: Right;   CYSTOSCOPY WITH RETROGRADE PYELOGRAM, URETEROSCOPY AND STENT PLACEMENT Left 07/17/2013   Procedure: CYSTOSCOPY, LEFT URETEROSCOPY, BASKET EXTRACTION OF STONE, INSERTION OF LEFT URETERAL STENT, ;  Surgeon: Jorja Loa, MD;  [facility];  Service: Urology;  Laterality: Left;   CYSTOSCOPY/RETROGRADE/URETEROSCOPY Right 09/12/2012   Procedure: CYSTOSCOPY/RETROGRADE/URETEROSCOPY;  Surgeon: Franchot Gallo, MD;  [facility];  Service: Urology;  Laterality: Right;   HOLMIUM LASER APPLICATION Right [id number]   Procedure: HOLMIUM LASER APPLICATION;  Surgeon: Franchot Gallo, MD;  [facility];  Service: Urology;  Laterality: Right;   HOLMIUM LASER APPLICATION Left [id number]   Procedure: HOLMIUM LASER APPLICATION;  Surgeon: Jorja Loa, MD;  [facility];  Service: Urology;  Laterality: Left;   KIDNEY STONE SURGERY     "probably 3 times"   LEFT HEART CATHETERIZATION WITH CORONARY ANGIOGRAM N/A 04/28/2011   Procedure: LEFT HEART CATHETERIZATION WITH CORONARY ANGIOGRAM;  Surgeon: Jacolyn Reedy, MD;  Location:  [facility] CATH [facility];  Service: Cardiovascular;  Laterality: N/A;   LITHOTRIPSY     "many; probably 5 times"   RADIAL ARTERY HARVEST  04/30/2011   Procedure: RADIAL ARTERY HARVEST;  Surgeon: Melrose Nakayama, MD;  Location: [facility];  Service: Open Heart Surgery;  Laterality: Left;   RETINAL DETACHMENT SURGERY  1990's   right eye; "made me have an early cataract"   TEE WITHOUT CARDIOVERSION N/A 06/17/2020   Procedure: TRANSESOPHAGEAL ECHOCARDIOGRAM (TEE);  Surgeon: Lelon Perla, MD;  Location: [facility] ENDOSCOPY;  Service: Cardiovascular;  Laterality: N/A;    VASECTOMY      There were no vitals filed for this visit.   Subjective Assessment - 10/04/20 1520     Pertinent History 70 y/o male presented to  ED for concern of dizziness and diplopia on 07/21/20. MRI showed acute L pontine CVA and prior L parietal infract.  PMH: CAD s/p CABG, HTN, asthma, type 2 diabetes, CKD stage III, HLD, remote R retinal detachment, HTN, CVA.    Limitations per MD ultrasound is ok as long as it is not over the site of loop recorder, TENS as long as it is not proximal to the coracoid    Patient Stated Goals regain use of RUE    Currently in Pain? Yes    Pain Score 4     Pain Location Shoulder    Pain Orientation Right    Pain Descriptors / Indicators Aching    Pain Type Acute pain    Pain Onset More than a month ago    Pain Frequency Intermittent    Aggravating Factors  malpositioning    Pain Relieving Factors repositioning                     Treatment: Discussed night time positioning to minimize shoulder pain. Supine gentle scapular and joint mobs, closed chain shoulder flexion within tolerated ROM, min v.c for postioning. Standing mid range shoulder flexion AA/ROM with UE ranger and therapist facilitating shoulder and scapula.  Seated mid range shoulder flexion closed chain, min v.c Seated fine motor coordination activity with min v.c to avoid shoulder hike, min difficulty but improved coordination. In basket message sent to MD.              OT Short Term Goals - 10/01/20 1216       OT SHORT TERM GOAL #1   Title I with HEP for vision, coordination    Time 4    Period Weeks    Status Achieved    Target Date 09/17/20      OT SHORT TERM GOAL #2   Title Pt will demonstrate improved RUE fine motor coordination for ADLS as evidenced by decreasing 9 hole peg test score to 52 secs or less    Time 4    Period Weeks    Status Achieved   09/26/20 - 50 sec 1st trial; 34 sec 2nd trial     OT SHORT TERM GOAL #3   Title Pt will  report that he is cutting food independently and  feeding himself with RUE at least 75% of the time.    Time 4    Period Weeks    Status Achieved      OT SHORT TERM GOAL #4   Title Pt will perfrom all basic ADLS with distant supervision.    Time 4    Period Weeks    Status Achieved      OT SHORT TERM GOAL #  5   Title Pt will demonstrate ability to retrieve a lightweight object at 110* shoulder flexion with good control and min compensation.    Time 4    Period Weeks    Status Achieved   120     OT SHORT TERM GOAL #6   Title Pt will increase RUE grip strength to 45 lbs or greater for increased functional use.    Time 4    Period Weeks    Status Achieved   56.4 lbs     OT SHORT TERM GOAL #7   Title Pt will verbalzie understanding of compensatroy strategies for visual deficits    Time 4    Period Weeks    Status Achieved               OT Long Term Goals - 10/01/20 1546       OT LONG TERM GOAL #1   Title I with all basic ADLS.    Time 12    Period Weeks    Status On-going      OT LONG TERM GOAL #2   Title Pt will demonstrate improved fine motor coordination for ADLs as evidenced by performing 9 hole peg test in 45 secs or less.    Time 12    Period Weeks    Status On-going      OT LONG TERM GOAL #3   Title Pt will perform simple snack/ beverage prep modified Independently.    Time 12    Period Weeks    Status On-going      OT LONG TERM GOAL #4   Title Pt will retrieve a 3 lbs object with RUE at 115 shoulder flexion with good control.    Time 12    Period Weeks    Status On-going      OT LONG TERM GOAL #5   Title Pt will perfrom simulated work activities modified Independently    Time 12    Period Weeks    Status On-going      OT LONG TERM GOAL #6   Title Pt will perfrom tabletop and environmental scanning with 90% or better accuracy in prep for work activities.    Time 12    Period Weeks    Status On-going                   Plan -  10/04/20 1517     Clinical Impression Statement Patient is limited by RUE pain, especially at night. He sees MD today regarding shoulder pain    OT Occupational Profile and History Detailed Assessment- Review of Records and additional review of physical, cognitive, psychosocial history related to current functional performance    Occupational performance deficits (Please refer to evaluation for details): ADL's;IADL's;Play;Work;Leisure;Social Participation    Body Structure / Function / Physical Skills ADL;Balance;Endurance;Mobility;Strength;Flexibility;UE functional use;FMC;Coordination;Gait;Vision;ROM;GMC;Decreased knowledge of precautions;Decreased knowledge of use of DME;Dexterity;IADL    Rehab Potential Good    Clinical Decision Making Limited treatment options, no task modification necessary    Comorbidities Affecting Occupational Performance: May have comorbidities impacting occupational performance    Modification or Assistance to Complete Evaluation  No modification of tasks or assist necessary to complete eval    OT Frequency 2x / week    OT Duration 12 weeks    OT Treatment/Interventions Self-care/ADL training;Therapeutic exercise;Functional Mobility Training;Balance training;Ultrasound;Neuromuscular education;Manual Therapy;Therapeutic activities;Paraffin;Cryotherapy;DME and/or AE instruction;Cognitive remediation/compensation;Visual/perceptual remediation/compensation;Fluidtherapy;Moist Heat;Electrical Stimulation;Passive range of motion;Patient/family education;Energy conservation;Aquatic Therapy;Gait Training    Plan aquatic therapy, NMR,  address shoulder pain and normal movement    Consulted and Agree with Plan of Care Patient             Patient will benefit from skilled therapeutic intervention in order to improve the following deficits and impairments:   Body Structure / Function / Physical Skills: ADL, Balance, Endurance, Mobility, Strength, Flexibility, UE functional use,  FMC, Coordination, Gait, Vision, ROM, GMC, Decreased knowledge of precautions, Decreased knowledge of use of DME, Dexterity, IADL       Visit Diagnosis: Muscle weakness (generalized)  Unsteadiness on feet  Other lack of coordination  Visuospatial deficit  Other abnormalities of gait and mobility    Problem List Patient Active Problem List   Diagnosis Date Noted   Left pontine stroke ([place]) 07/25/2020   Acute right hemiparesis ([facility]) 07/25/2020   Essential hypertension    History of CVA (cerebrovascular accident)    CKD (chronic kidney disease), stage II    Diabetes mellitus type 2 in obese ([place])    Dyslipidemia    CVA (cerebral vascular accident) ([place]) 07/21/2020   Cryptogenic stroke ([place]) 06/18/2020   Acute-on-chronic kidney injury ([place]) 05/28/2020   Stroke ([place]) 05/27/2020   Elevated troponin 01/13/2019   COVID-19 virus infection 01/13/2019   S/P CABG (coronary artery bypass graft)    CAD (coronary artery disease) 04/28/2011   Insulin dependent type 2 diabetes mellitus, controlled ([facility])    Hyperlipidemia    Hypertension    Obesity (BMI 30-39.9)    Nephrolithiasis, uric acid     Mitchell Lambert 10/04/2020, 3:23 PM  [facility] [address] [place], [place], [address] Phone: [phone]   Fax:  [phone]  Name: Mitchell Lambert MRN: [id number] Date of Birth: 1950/12/07

## 2020-10-07 ENCOUNTER — Encounter: Payer: Self-pay | Admitting: Occupational Therapy

## 2020-10-07 ENCOUNTER — Ambulatory Visit: Payer: PRIVATE HEALTH INSURANCE | Admitting: Occupational Therapy

## 2020-10-07 DIAGNOSIS — M7541 Impingement syndrome of right shoulder: Secondary | ICD-10-CM | POA: Diagnosis not present

## 2020-10-07 DIAGNOSIS — I639 Cerebral infarction, unspecified: Secondary | ICD-10-CM | POA: Diagnosis not present

## 2020-10-07 NOTE — Therapy (Signed)
[facility] [address] [place], [place], [address] Phone: [phone]   Fax:  [phone]  Occupational Therapy Treatment  Patient Details  Name: Mitchell Lambert MRN: [id number] Date of Birth: 05/30/1950 Referring Provider (OT): Dr. Leonie Man   Encounter Date: 10/07/2020   OT End of Session - 10/07/20 1429     Visit Number 11    Number of Visits 24    Date for OT Re-Evaluation 11/12/20    Authorization Type Medicare    Progress Note Due on Visit 73    OT Start Time 1322    OT Stop Time 1415    OT Time Calculation (min) 53 min    Activity Tolerance Patient tolerated treatment well    Behavior During Therapy WFL for tasks assessed/performed             Past Medical History:  Diagnosis Date   Angina    NO ANGINA SINCE CABG   Arthritis    right ankle   Chronic daily headache    "@ least every other day""improved since heart surgery"   Coronary artery disease    PT [place] CABG SURGERY - DR. TILLEY IS HIS CARDIOLOGIST   Diabetes mellitus    Lantus x 10 yrs   Gout    History of kidney stones 09-08-12   multiple times with some lithotripsies   Hyperlipemia    Hypertension     Past Surgical History:  Procedure Laterality Date   BACK SURGERY     microsurgery: spinal stenosis -lumbar   bone spur  1990's   right great toe; "probably related to gout"   BUBBLE STUDY  06/17/2020   Procedure: BUBBLE STUDY;  Surgeon: Lelon Perla, MD;  Location: [facility];  Service: Cardiovascular;;   CARDIAC CATHETERIZATION  04/27/11   CATARACT EXTRACTION W/ INTRAOCULAR LENS IMPLANT  1990's   right; "lens was replaced twice over 3 months; lens is actually over iris"   CORONARY ARTERY BYPASS GRAFT  04/30/2011   Procedure: CORONARY ARTERY BYPASS GRAFTING (CABG);  Surgeon: Melrose Nakayama, MD;  Location: [facility];  Service: Open Heart Surgery;  Laterality: N/A;,x4 vessels   CYSTOSCOPY W/ URETERAL STENT  PLACEMENT Right 08/19/2012   Procedure: CYSTOSCOPY WITH RETROGRADE PYELOGRAM/URETERAL STENT PLACEMENT;  Surgeon: Molli Hazard, MD;  Location[facility];  Service: Urology;  Laterality: Right;   CYSTOSCOPY WITH RETROGRADE PYELOGRAM, URETEROSCOPY AND STENT PLACEMENT Left 07/17/2013   Procedure: CYSTOSCOPY, LEFT URETEROSCOPY, BASKET EXTRACTION OF STONE, INSERTION OF LEFT URETERAL STENT, ;  Surgeon: Jorja Loa, MD;  [facility];  Service: Urology;  Laterality: Left;   CYSTOSCOPY/RETROGRADE/URETEROSCOPY Right 09/12/2012   Procedure: CYSTOSCOPY/RETROGRADE/URETEROSCOPY;  Surgeon: Franchot Gallo, MD;  [facility];  Service: Urology;  Laterality: Right;   HOLMIUM LASER APPLICATION Right [id number]   Procedure: HOLMIUM LASER APPLICATION;  Surgeon: Franchot Gallo, MD;  [facility];  Service: Urology;  Laterality: Right;   HOLMIUM LASER APPLICATION Left [id number]   Procedure: HOLMIUM LASER APPLICATION;  Surgeon: Jorja Loa, MD;  [facility];  Service: Urology;  Laterality: Left;   KIDNEY STONE SURGERY     "probably 3 times"   LEFT HEART CATHETERIZATION WITH CORONARY ANGIOGRAM N/A 04/28/2011   Procedure: LEFT HEART CATHETERIZATION WITH CORONARY ANGIOGRAM;  Surgeon: Jacolyn Reedy, MD;  Location: [facility];  Service: Cardiovascular;  Laterality: N/A;   LITHOTRIPSY     "many; probably 5 times"   [place]  04/30/2011   Procedure: RADIAL ARTERY HARVEST;  Surgeon: Melrose Nakayama, MD;  Location: [facility];  Service: Open Heart Surgery;  Laterality: Left;   RETINAL DETACHMENT SURGERY  1990's   right eye; "made me have an early cataract"   TEE WITHOUT CARDIOVERSION N/A 06/17/2020   Procedure: TRANSESOPHAGEAL ECHOCARDIOGRAM (TEE);  Surgeon: Lelon Perla, MD;  Location: [facility] ENDOSCOPY;  Service: Cardiovascular;  Laterality: N/A;   VASECTOMY      There were no vitals filed for this visit.   Subjective Assessment - 10/07/20 1429     Subjective   Patient reports new muscle relaxant, taken at night is helping with shoulder pain, and better able to rest    Pertinent History 69 y/o male presented to  ED for concern of dizziness and diplopia on 07/21/20. MRI showed acute L pontine CVA and prior L parietal infract.  PMH: CAD s/p CABG, HTN, asthma, type 2 diabetes, CKD stage III, HLD, remote R retinal detachment, HTN, CVA.    Limitations per MD ultrasound is ok as long as it is not over the site of loop recorder, TENS as long as it is not proximal to the coracoid    Currently in Pain? No/denies    Pain Score 0-No pain             Patient seen for aquatic therapy visit.  Patient entered and exited the pool with min assist and cueing to attempt step through pattern on stairs.  Session took place in 3.5-4.5 ft of water. Initially worked to work within pain free shoulder range of motion with water dumbbell for buoyancy assisted then buoyancy resisted motion.  Patient without report of shoulder pain this session.  Worked on balance with underwater step - to address single limb stance without and with mild turbulence.  Supine floatation to address end range shoulder abduction, external rotation.                         OT Short Term Goals - 10/07/20 1432       OT SHORT TERM GOAL #1   Title I with HEP for vision, coordination    Time 4    Period Weeks    Status Achieved    Target Date 09/17/20      OT SHORT TERM GOAL #2   Title Pt will demonstrate improved RUE fine motor coordination for ADLS as evidenced by decreasing 9 hole peg test score to 52 secs or less    Time 4    Period Weeks    Status Achieved   09/26/20 - 50 sec 1st trial; 34 sec 2nd trial     OT SHORT TERM GOAL #3   Title Pt will report that he is cutting food independently and  feeding himself with RUE at least 75% of the time.    Time 4    Period Weeks    Status Achieved      OT SHORT TERM GOAL #4   Title Pt will perfrom all basic ADLS with distant  supervision.    Time 4    Period Weeks    Status Achieved      OT SHORT TERM GOAL #5   Title Pt will demonstrate ability to retrieve a lightweight object at 110* shoulder flexion with good control and min compensation.    Time 4    Period Weeks    Status Achieved   120     OT SHORT TERM  GOAL #6   Title Pt will increase RUE grip strength to 45 lbs or greater for increased functional use.    Time 4    Period Weeks    Status Achieved   56.4 lbs     OT SHORT TERM GOAL #7   Title Pt will verbalzie understanding of compensatroy strategies for visual deficits    Time 4    Period Weeks    Status Achieved               OT Long Term Goals - 10/07/20 1432       OT LONG TERM GOAL #1   Title I with all basic ADLS.    Time 12    Period Weeks    Status On-going      OT LONG TERM GOAL #2   Title Pt will demonstrate improved fine motor coordination for ADLs as evidenced by performing 9 hole peg test in 45 secs or less.    Time 12    Period Weeks    Status On-going      OT LONG TERM GOAL #3   Title Pt will perform simple snack/ beverage prep modified Independently.    Time 12    Period Weeks    Status On-going      OT LONG TERM GOAL #4   Title Pt will retrieve a 3 lbs object with RUE at 115 shoulder flexion with good control.    Time 12    Period Weeks    Status On-going      OT LONG TERM GOAL #5   Title Pt will perfrom simulated work activities modified Independently    Time 12    Period Weeks    Status On-going      OT LONG TERM GOAL #6   Title Pt will perfrom tabletop and environmental scanning with 90% or better accuracy in prep for work activities.    Time 12    Period Weeks    Status On-going                   Plan - 10/07/20 1431     Clinical Impression Statement Patient started on muscle relaxant, and reports significant relief of shoulder pain at night    OT Occupational Profile and History Detailed Assessment- Review of Records and additional  review of physical, cognitive, psychosocial history related to current functional performance    Occupational performance deficits (Please refer to evaluation for details): ADL's;IADL's;Play;Work;Leisure;Social Participation    Body Structure / Function / Physical Skills ADL;Balance;Endurance;Mobility;Strength;Flexibility;UE functional use;FMC;Coordination;Gait;Vision;ROM;GMC;Decreased knowledge of precautions;Decreased knowledge of use of DME;Dexterity;IADL    Rehab Potential Good    Clinical Decision Making Limited treatment options, no task modification necessary    Comorbidities Affecting Occupational Performance: May have comorbidities impacting occupational performance    Modification or Assistance to Complete Evaluation  No modification of tasks or assist necessary to complete eval    OT Frequency 2x / week    OT Duration 12 weeks    OT Treatment/Interventions Self-care/ADL training;Therapeutic exercise;Functional Mobility Training;Balance training;Ultrasound;Neuromuscular education;Manual Therapy;Therapeutic activities;Paraffin;Cryotherapy;DME and/or AE instruction;Cognitive remediation/compensation;Visual/perceptual remediation/compensation;Fluidtherapy;Moist Heat;Electrical Stimulation;Passive range of motion;Patient/family education;Energy conservation;Aquatic Therapy;Gait Training    Plan aquatic therapy, NMR, address shoulder pain and normal movement    Consulted and Agree with Plan of Care Patient             Patient will benefit from skilled therapeutic intervention in order to improve the following deficits and impairments:   Body Structure /  Function / Physical Skills: ADL, Balance, Endurance, Mobility, Strength, Flexibility, UE functional use, FMC, Coordination, Gait, Vision, ROM, GMC, Decreased knowledge of precautions, Decreased knowledge of use of DME, Dexterity, IADL       Visit Diagnosis: Acute right hemiparesis (HCC)  Muscle weakness (generalized)  Unsteadiness  on feet  Other lack of coordination  Visuospatial deficit    Problem List Patient Active Problem List   Diagnosis Date Noted   Left pontine stroke ([place]) 07/25/2020   Acute right hemiparesis ([facility]) 07/25/2020   Essential hypertension    History of CVA (cerebrovascular accident)    CKD (chronic kidney disease), stage II    Diabetes mellitus type 2 in obese ([place])    Dyslipidemia    CVA (cerebral vascular accident) ([place]) 07/21/2020   Cryptogenic stroke ([place]) 06/18/2020   Acute-on-chronic kidney injury ([place]) 05/28/2020   Stroke ([facility]) 05/27/2020   Elevated troponin 01/13/2019   COVID-19 virus infection 01/13/2019   S/P CABG (coronary artery bypass graft)    CAD (coronary artery disease) 04/28/2011   Insulin dependent type 2 diabetes mellitus, controlled ([place])    Hyperlipidemia    Hypertension    Obesity (BMI 30-39.9)    Nephrolithiasis, uric acid     Mariah Milling 10/07/2020, 2:33 PM  [facility] [address] [place], [place], [address] Phone: [phone]   Fax:  [phone]  Name: Mitchell Lambert MRN: [id number] Date of Birth: 30-Sep-1950

## 2020-10-08 ENCOUNTER — Other Ambulatory Visit: Payer: Self-pay

## 2020-10-08 ENCOUNTER — Ambulatory Visit: Payer: Medicare Other | Admitting: Occupational Therapy

## 2020-10-08 ENCOUNTER — Encounter: Payer: Self-pay | Admitting: Occupational Therapy

## 2020-10-08 ENCOUNTER — Encounter: Payer: Self-pay | Admitting: Physical Therapy

## 2020-10-08 ENCOUNTER — Ambulatory Visit: Payer: Medicare Other | Admitting: Physical Therapy

## 2020-10-08 DIAGNOSIS — M6281 Muscle weakness (generalized): Secondary | ICD-10-CM

## 2020-10-08 DIAGNOSIS — R2681 Unsteadiness on feet: Secondary | ICD-10-CM

## 2020-10-08 DIAGNOSIS — I639 Cerebral infarction, unspecified: Secondary | ICD-10-CM | POA: Diagnosis not present

## 2020-10-08 DIAGNOSIS — R278 Other lack of coordination: Secondary | ICD-10-CM

## 2020-10-08 DIAGNOSIS — R2689 Other abnormalities of gait and mobility: Secondary | ICD-10-CM

## 2020-10-08 DIAGNOSIS — M7541 Impingement syndrome of right shoulder: Secondary | ICD-10-CM | POA: Diagnosis not present

## 2020-10-08 DIAGNOSIS — R41842 Visuospatial deficit: Secondary | ICD-10-CM

## 2020-10-08 DIAGNOSIS — G8191 Hemiplegia, unspecified affecting right dominant side: Secondary | ICD-10-CM

## 2020-10-08 NOTE — Therapy (Signed)
[facility] [address] [place], [place], [address] Phone: [phone]   Fax:  [phone]  Occupational Therapy Treatment  Patient Details  Name: Mitchell Lambert MRN: [id number] Date of Birth: [birth year] Referring Provider (OT): Dr. Leonie Man   Encounter Date: 10/08/2020   OT End of Session - 10/08/20 1515     Visit Number 12    Number of Visits 24    Date for OT Re-Evaluation 11/12/20    Authorization Type Medicare    Progress Note Due on Visit 12    OT Start Time 1150    OT Stop Time 1230    OT Time Calculation (min) 40 min    Behavior During Therapy WFL for tasks assessed/performed             Past Medical History:  Diagnosis Date   Angina    NO ANGINA SINCE CABG   Arthritis    right ankle   Chronic daily headache    "@ least every other day""improved since heart surgery"   Coronary artery disease    PT [place] CABG SURGERY - DR. TILLEY IS HIS CARDIOLOGIST   Diabetes mellitus    Lantus x 10 yrs   Gout    History of kidney stones 09-08-12   multiple times with some lithotripsies   Hyperlipemia    Hypertension     Past Surgical History:  Procedure Laterality Date   BACK SURGERY     microsurgery: spinal stenosis -lumbar   bone spur  1990's   right great toe; "probably related to gout"   BUBBLE STUDY  06/17/2020   Procedure: BUBBLE STUDY;  Surgeon: Lelon Perla, MD;  Location: [facility];  Service: Cardiovascular;;   CARDIAC CATHETERIZATION  04/27/11   CATARACT EXTRACTION W/ INTRAOCULAR LENS IMPLANT  1990's   right; "lens was replaced twice over 3 months; lens is actually over iris"   CORONARY ARTERY BYPASS GRAFT  04/30/2011   Procedure: CORONARY ARTERY BYPASS GRAFTING (CABG);  Surgeon: Melrose Nakayama, MD;  Location: [facility];  Service: Open Heart Surgery;  Laterality: N/A;,x4 vessels   CYSTOSCOPY W/ URETERAL STENT PLACEMENT Right 08/19/2012   Procedure: CYSTOSCOPY WITH  RETROGRADE PYELOGRAM/URETERAL STENT PLACEMENT;  Surgeon: Molli Hazard, MD;  [facility];  Service: Urology;  Laterality: Right;   CYSTOSCOPY WITH RETROGRADE PYELOGRAM, URETEROSCOPY AND STENT PLACEMENT Left 07/17/2013   Procedure: CYSTOSCOPY, LEFT URETEROSCOPY, BASKET EXTRACTION OF STONE, INSERTION OF LEFT URETERAL STENT, ;  Surgeon: Jorja Loa, MD;  [facility];  Service: Urology;  Laterality: Left;   CYSTOSCOPY/RETROGRADE/URETEROSCOPY Right 09/12/2012   Procedure: CYSTOSCOPY/RETROGRADE/URETEROSCOPY;  Surgeon: Franchot Gallo, MD;  [facility];  Service: Urology;  Laterality: Right;   HOLMIUM LASER APPLICATION Right [id number]   Procedure: HOLMIUM LASER APPLICATION;  Surgeon: Franchot Gallo, MD;  [facility];  Service: Urology;  Laterality: Right;   HOLMIUM LASER APPLICATION Left [id number]   Procedure: HOLMIUM LASER APPLICATION;  Surgeon: Jorja Loa, MD;  [facility];  Service: Urology;  Laterality: Left;   KIDNEY STONE SURGERY     "probably 3 times"   LEFT HEART CATHETERIZATION WITH CORONARY ANGIOGRAM N/A 04/28/2011   Procedure: LEFT HEART CATHETERIZATION WITH CORONARY ANGIOGRAM;  Surgeon: Jacolyn Reedy, MD;  Location: [facility];  Service: Cardiovascular;  Laterality: N/A;   LITHOTRIPSY     "many; probably 5 times"   RADIAL ARTERY HARVEST  04/30/2011   Procedure: RADIAL ARTERY HARVEST;  Surgeon:  Melrose Nakayama, MD;  Location: [facility];  Service: Open Heart Surgery;  Laterality: Left;   RETINAL DETACHMENT SURGERY  1990's   right eye; "made me have an early cataract"   TEE WITHOUT CARDIOVERSION N/A 06/17/2020   Procedure: TRANSESOPHAGEAL ECHOCARDIOGRAM (TEE);  Surgeon: Lelon Perla, MD;  Location: [facility]OSCOPY;  Service: Cardiovascular;  Laterality: N/A;   VASECTOMY      There were no vitals filed for this visit.   Subjective Assessment - 10/08/20 1153     Subjective  I am going to my friends place at the [place].  We will plan  to go out on his boat.    Pertinent History 70 y/o male presented to  ED for concern of dizziness and diplopia on 07/21/20. MRI showed acute L pontine CVA and prior L parietal infract.  PMH: CAD s/p CABG, HTN, asthma, type 2 diabetes, CKD stage III, HLD, remote R retinal detachment, HTN, CVA.    Limitations per MD ultrasound is ok as long as it is not over the site of loop recorder, TENS as long as it is not proximal to the coracoid    Patient Stated Goals regain use of RUE    Currently in Pain? No/denies    Pain Score 0-No pain                          OT Treatments/Exercises (OP) - 10/08/20 0001       ADLs   Functional Mobility Discussed paln for getting into and out of boat from dock this weekend.  Will use ladder in aquatic environment to assess his ability to climb into boat from in the water.      Neurological Re-education Exercises   Other Exercises 1 Neuromuscular reeducation to address end range of RUE shoulder flexion with external rotation.  Worked on apssive to active motion with cueing to activate core musculature throughout movement of RUE.  Patient able to achieve 140 degrees of flexion while supine before experiencing slight ache in posterior / superior shoulder.    Other Exercises 2 Working on sit to stand, stand to sit without uE support but aligning mass over base of support.  Worked then on reaching mid level toward high reach in sitting and standing.                      OT Short Term Goals - 10/08/20 1518       OT SHORT TERM GOAL #1   Title I with HEP for vision, coordination    Time 4    Period Weeks    Status Achieved    Target Date 09/17/20      OT SHORT TERM GOAL #2   Title Pt will demonstrate improved RUE fine motor coordination for ADLS as evidenced by decreasing 9 hole peg test score to 52 secs or less    Time 4    Period Weeks    Status Achieved   09/26/20 - 50 sec 1st trial; 34 sec 2nd trial     OT SHORT TERM GOAL #3    Title Pt will report that he is cutting food independently and  feeding himself with RUE at least 75% of the time.    Time 4    Period Weeks    Status Achieved      OT SHORT TERM GOAL #4   Title Pt will perfrom all basic ADLS with distant supervision.  Time 4    Period Weeks    Status Achieved      OT SHORT TERM GOAL #5   Title Pt will demonstrate ability to retrieve a lightweight object at 110* shoulder flexion with good control and min compensation.    Time 4    Period Weeks    Status Achieved   120     OT SHORT TERM GOAL #6   Title Pt will increase RUE grip strength to 45 lbs or greater for increased functional use.    Time 4    Period Weeks    Status Achieved   56.4 lbs     OT SHORT TERM GOAL #7   Title Pt will verbalzie understanding of compensatroy strategies for visual deficits    Time 4    Period Weeks    Status Achieved               OT Long Term Goals - 10/08/20 1519       OT LONG TERM GOAL #1   Title I with all basic ADLS.    Time 12    Period Weeks    Status On-going      OT LONG TERM GOAL #2   Title Pt will demonstrate improved fine motor coordination for ADLs as evidenced by performing 9 hole peg test in 45 secs or less.    Time 12    Period Weeks    Status On-going      OT LONG TERM GOAL #3   Title Pt will perform simple snack/ beverage prep modified Independently.    Time 12    Period Weeks    Status On-going      OT LONG TERM GOAL #4   Title Pt will retrieve a 3 lbs object with RUE at 115 shoulder flexion with good control.    Time 12    Period Weeks    Status On-going      OT LONG TERM GOAL #5   Title Pt will perfrom simulated work activities modified Independently    Time 12    Period Weeks    Status On-going      OT LONG TERM GOAL #6   Title Pt will perfrom tabletop and environmental scanning with 90% or better accuracy in prep for work activities.    Time 12    Period Weeks    Status On-going                    Plan - 10/08/20 1517     Clinical Impression Statement Patient continues to report less arm pain.    OT Occupational Profile and History Detailed Assessment- Review of Records and additional review of physical, cognitive, psychosocial history related to current functional performance    Occupational performance deficits (Please refer to evaluation for details): ADL's;IADL's;Play;Work;Leisure;Social Participation    Body Structure / Function / Physical Skills ADL;Balance;Endurance;Mobility;Strength;Flexibility;UE functional use;FMC;Coordination;Gait;Vision;ROM;GMC;Decreased knowledge of precautions;Decreased knowledge of use of DME;Dexterity;IADL    Rehab Potential Good    Clinical Decision Making Limited treatment options, no task modification necessary    Comorbidities Affecting Occupational Performance: May have comorbidities impacting occupational performance    Modification or Assistance to Complete Evaluation  No modification of tasks or assist necessary to complete eval    OT Frequency 2x / week    OT Duration 12 weeks    OT Treatment/Interventions Self-care/ADL training;Therapeutic exercise;Functional Mobility Training;Balance training;Ultrasound;Neuromuscular education;Manual Therapy;Therapeutic activities;Paraffin;Cryotherapy;DME and/or AE instruction;Cognitive remediation/compensation;Visual/perceptual remediation/compensation;Fluidtherapy;Moist Heat;Electrical Stimulation;Passive range of  motion;Patient/family education;Energy conservation;Aquatic Therapy;Gait Training    Plan aquatic therapy, NMR, address shoulder pain and normal movement    Consulted and Agree with Plan of Care Patient             Patient will benefit from skilled therapeutic intervention in order to improve the following deficits and impairments:   Body Structure / Function / Physical Skills: ADL, Balance, Endurance, Mobility, Strength, Flexibility, UE functional use, FMC, Coordination, Gait,  Vision, ROM, GMC, Decreased knowledge of precautions, Decreased knowledge of use of DME, Dexterity, IADL       Visit Diagnosis: Acute right hemiparesis (HCC)  Muscle weakness (generalized)  Unsteadiness on feet  Other lack of coordination  Visuospatial deficit    Problem List Patient Active Problem List   Diagnosis Date Noted   Left pontine stroke ([facility]) 07/25/2020   Acute right hemiparesis ([place]) 07/25/2020   Essential hypertension    History of CVA (cerebrovascular accident)    CKD (chronic kidney disease), stage II    Diabetes mellitus type 2 in obese ([place])    Dyslipidemia    CVA (cerebral vascular accident) ([place]) 07/21/2020   Cryptogenic stroke ([place]) 06/18/2020   Acute-on-chronic kidney injury ([place]) 05/28/2020   Stroke ([place]) 05/27/2020   Elevated troponin 01/13/2019   COVID-19 virus infection 01/13/2019   S/P CABG (coronary artery bypass graft)    CAD (coronary artery disease) 04/28/2011   Insulin dependent type 2 diabetes mellitus, controlled ([place])    Hyperlipidemia    Hypertension    Obesity (BMI 30-39.9)    Nephrolithiasis, uric acid     Mariah Milling 10/08/2020, 3:19 PM  [facility] [address] [place], [place], [address] Phone: [phone]   Fax:  [phone]  Name: Mitchell Lambert MRN: [id number] Date of Birth: Apr 16, 1950

## 2020-10-08 NOTE — Therapy (Addendum)
[facility] [address] [place], [place], [address] Phone: [phone]   Fax:  [phone]  Physical Therapy Treatment/Re-Cert  Patient Details  Name: Mitchell Lambert MRN: [id number] Date of Birth: 05/11/1950 Referring Provider (PT): Garvin Fila, MD   Encounter Date: 10/08/2020   10/08/20 2121  PT Visits / Re-Eval  Visit Number 11  Number of Visits 27  Date for PT Re-Evaluation 15/52/08 (per re-cert on 0/2/23)  Authorization  Authorization Type Medicare - will need 10th visit PN  Progress Note Due on Visit 20  PT Time Calculation  PT Start Time 1233  PT Stop Time 1313  PT Time Calculation (min) 40 min  PT - End of Session  Equipment Utilized During Treatment Gait belt  Activity Tolerance Patient tolerated treatment well  Behavior During Therapy WFL for tasks assessed/performed   Past Medical History:  Diagnosis Date   Angina    NO ANGINA SINCE CABG   Arthritis    right ankle   Chronic daily headache    "@ least every other day""improved since heart surgery"   Coronary artery disease    PT [place] CABG SURGERY - DR. TILLEY IS HIS CARDIOLOGIST   Diabetes mellitus    Lantus x 10 yrs   Gout    History of kidney stones 09-08-12   multiple times with some lithotripsies   Hyperlipemia    Hypertension     Past Surgical History:  Procedure Laterality Date   BACK SURGERY     microsurgery: spinal stenosis -lumbar   bone spur  1990's   right great toe; "probably related to gout"   BUBBLE STUDY  06/17/2020   Procedure: BUBBLE STUDY;  Surgeon: Lelon Perla, MD;  Location: [facility];  Service: Cardiovascular;;   CARDIAC CATHETERIZATION  04/27/11   CATARACT EXTRACTION W/ INTRAOCULAR LENS IMPLANT  1990's   right; "lens was replaced twice over 3 months; lens is actually over iris"   CORONARY ARTERY BYPASS GRAFT  04/30/2011   Procedure: CORONARY ARTERY BYPASS GRAFTING (CABG);   Surgeon: Melrose Nakayama, MD;  Location: [facility];  Service: Open Heart Surgery;  Laterality: N/A;,x4 vessels   CYSTOSCOPY W/ URETERAL STENT PLACEMENT Right 08/19/2012   Procedure: CYSTOSCOPY WITH RETROGRADE PYELOGRAM/URETERAL STENT PLACEMENT;  Surgeon: Molli Hazard, MD;  Location: [facility];  Service: Urology;  Laterality: Right;   CYSTOSCOPY WITH RETROGRADE PYELOGRAM, URETEROSCOPY AND STENT PLACEMENT Left 07/17/2013   Procedure: CYSTOSCOPY, LEFT URETEROSCOPY, BASKET EXTRACTION OF STONE, INSERTION OF LEFT URETERAL STENT, ;  Surgeon: Jorja Loa, MD;  Location: [facility];  Service: Urology;  Laterality: Left;   CYSTOSCOPY/RETROGRADE/URETEROSCOPY Right 09/12/2012   Procedure: CYSTOSCOPY/RETROGRADE/URETEROSCOPY;  Surgeon: Franchot Gallo, MD;  Location: [facility];  Service: Urology;  Laterality: Right;   HOLMIUM LASER APPLICATION Right 3/61/2244   Procedure: HOLMIUM LASER APPLICATION;  Surgeon: Franchot Gallo, MD;  Location: [facility];  Service: Urology;  Laterality: Right;   HOLMIUM LASER APPLICATION Left 9/75/3005   Procedure: HOLMIUM LASER APPLICATION;  Surgeon: Jorja Loa, MD;  Location: [facility];  Service: Urology;  Laterality: Left;   KIDNEY STONE SURGERY     "probably 3 times"   LEFT HEART CATHETERIZATION WITH CORONARY ANGIOGRAM N/A 04/28/2011   Procedure: LEFT HEART CATHETERIZATION WITH CORONARY ANGIOGRAM;  Surgeon: Jacolyn Reedy, MD;  Location: [facility];  Service: Cardiovascular;  Laterality: N/A;   LITHOTRIPSY     "many; probably 5 times"   [place]  04/30/2011   Procedure: RADIAL ARTERY HARVEST;  Surgeon: Melrose Nakayama, MD;  Location: [facility];  Service: Open Heart Surgery;  Laterality: Left;   RETINAL DETACHMENT SURGERY  1990's   right eye; "made me have an early cataract"   TEE WITHOUT CARDIOVERSION N/A 06/17/2020   Procedure: TRANSESOPHAGEAL ECHOCARDIOGRAM (TEE);  Surgeon: Lelon Perla, MD;  Location: [facility];  Service: Cardiovascular;   Laterality: N/A;   VASECTOMY      There were no vitals filed for this visit.   Subjective Assessment - 10/08/20 1235     Subjective Using the cane has been going well - has been using it in the house. Still using RW outdoors. Reports shoulder is getting better. Wants to work towards getting rid of the RW.    Patient is accompained by: Family member    Pertinent History achilles tendon tear on L    Limitations Standing;Sitting;House hold activities    How long can you stand comfortably? No issues    How long can you walk comfortably? In house only    Patient Stated Goals Return to their own home, play drums again    Currently in Pain? No/denies    Pain Onset More than a month ago                [facility] PT Assessment - 10/08/20 1241       Assessment   Medical Diagnosis I63.9 (ICD-10-CM) - Left pontine stroke ([place])    Referring Provider (PT) Garvin Fila, MD      Strength   Right Hip Flexion 4+/5    Right Knee Flexion 4-/5    Right Knee Extension 5/5    Right Ankle Dorsiflexion 3-/5    Right Ankle Plantar Flexion 3-/5      Balance   Balance Assessed Yes      Standardized Balance Assessment   Standardized Balance Assessment Timed Up and Go Test      Dynamic Gait Index   Level Surface Mild Impairment    Change in Gait Speed Mild Impairment    Gait with Horizontal Head Turns Mild Impairment    Gait with Vertical Head Turns Moderate Impairment    Gait and Pivot Turn Mild Impairment    Step Over Obstacle Mild Impairment    Step Around Obstacles Normal    Steps Moderate Impairment    Total Score 15    DGI comment: 15/24      Timed Up and Go Test   Normal TUG (seconds) 13.59    TUG Comments with [facility]                            10/08/20 1307  Ambulation/Gait  Ambulation/Gait Yes  Ambulation/Gait Assistance 4: Min guard;5: Supervision  Ambulation/Gait Assistance Details indoor distances with supervision, limited distances outdoors due to pt's  sensitivity to light with min guard  Ambulation Distance (Feet) 200 Feet (x1 outdoors, clinic distances indoors)  Assistive device Straight cane;Other (Comment) (4 prong tip)  Gait Pattern Step-through pattern;Decreased stance time - right;Decreased dorsiflexion - right;Decreased weight shift to right;Right foot flat  Ambulation Surface Level;Indoor;Unlevel;Outdoor;Paved  Gait velocity 14 seconds = 2.34 ft/sec  Curb Other (comment) (min guard, cues for sequencing and cane placement, performed x4 reps indoors, pt able to perform stepping up with LLE first and clear RLE each time)  Gait Comments gait over 4" obstacle x6 reps total with SPC with quad tip, cues for proper  sequencing and technique and to lead with RLE to avoid getting it caught on obstacle      PT Short Term Goals - 09/17/20 1155       PT SHORT TERM GOAL #1   Title Pt will be independent with initial HEP    Baseline pt reports compliance (09/17/20)    Time 4    Period Weeks    Status Achieved    Target Date 09/17/20      PT SHORT TERM GOAL #2   Title Pt will be able to demo at least 3/5 R ankle DF to play on his drums    Baseline 3+/5 on R ankle DF (09/17/20)    Time 4    Period Weeks    Status Achieved    Target Date 09/17/20      PT SHORT TERM GOAL #3   Title Pt will be able to walk >500' with LRAD and SBA for improved home/community mobility    Baseline 545' with st. cane (quad tip) with CGA    Time 4    Period Weeks    Status On-going    Target Date 09/17/20      PT SHORT TERM GOAL #4   Title Pt will be able to ascend/descend at least 20 steps with railing and SBA for safe return home    Baseline Performed 4 steps with bilat rails and min A; performed 20 steps with one rail and st. cane on contralateral side with SBA (09/17/20)    Time 4    Period Weeks    Status Achieved    Target Date 09/17/20            Revised STGs:  PT Short Term Goals - 10/11/20 1347       PT SHORT TERM GOAL #1   Title Pt  will perform 8 steps with SPC and single handrail with supervision in order to demo improved community access. ALL STGS DUE 11/08/20    Time 4    Period Weeks    Status New    Target Date 11/08/20      PT SHORT TERM GOAL #2   Title Pt will perform a curb with SPC with quad tip and supervision in order to demo improved community mobility.    Baseline requires min guard    Time 4    Period Weeks    Status New      PT SHORT TERM GOAL #3   Title Pt will decr TUG time with SPC to at least 12 seconds or less to demo decr fall risk.    Baseline 13.59 seconds    Time 4    Period Weeks    Status New      PT SHORT TERM GOAL #4   Title Pt will ambulate at least 250' outdoors with [facility] with quad tip and supervision in order to demo improved community mobility.    Baseline needs min guard, limited distances outdoors due to light sensitivity    Time 4    Period Weeks    Status New               PT Long Term Goals - 10/08/20 1239       PT LONG TERM GOAL #1   Title Pt will be independent with advanced HEP for strengthening and balance    Baseline did not have time to assess, pt will benefit from ongoing additions/updates    Time 8    Period  Weeks    Status On-going      PT LONG TERM GOAL #2   Title Pt will have improved Berg Balance Score of at least 45/56 to indicate decreased fall risk    Baseline 39/56; 49/56 on 10/03/20    Time 8    Period Weeks    Status Achieved      PT LONG TERM GOAL #3   Title Pt will have improved DGI score to at least 19/24 to demo decreased fall risk    Baseline 17/24; 15/24 ON 10/08/20    Time 8    Period Weeks    Status Not Met      PT LONG TERM GOAL #4   Title Pt will be able to amb at least 1000' with LRAD mod I on level and unlevel surfaces for community amb    Baseline need to use RW for longer outdoor distances, did not have time to do distance outdoors today/too hot    Time 8    Period Weeks    Status On-going      PT LONG TERM GOAL #5    Title Pt will be able to use R foot/ankle for drumming to return to leisure activities    Baseline has been trying to use his drums at home    Time 8    Period Weeks    Status Partially Met            Revised/ongoing LTGs:  PT Long Term Goals - 10/11/20 1350       PT LONG TERM GOAL #1   Title Pt will be independent with advanced HEP for strengthening and balance ALL LTGS DUE 12/06/20    Baseline did not have time to assess, pt will benefit from ongoing additions/updates    Time 8    Period Weeks    Status On-going    Target Date 12/06/20      PT LONG TERM GOAL #2   Title Pt will improve gait speed with cane to at least 2.75 ft/sec in order to demo improved community mobility.    Baseline 2.34 ft/sec    Time 8    Period Weeks    Status New      PT LONG TERM GOAL #3   Title Pt will have improved DGI score to at least 19/24 to demo decreased fall risk    Baseline 17/24; 15/24 ON 10/08/20    Time 8    Period Weeks    Status On-going      PT LONG TERM GOAL #4   Title Pt will be able to amb at least 500' with LRAD mod I on level and unlevel surfaces for community amb    Baseline need to use RW for longer outdoor distances, did not have time to do distance outdoors today/too hot    Time 8    Period Weeks    Status Revised      PT LONG TERM GOAL #5   Title Pt will be able to use R foot/ankle for drumming to return to leisure activities    Baseline has been trying to use his drums at home    Time 8    Period Weeks    Status On-going                  10/11/20 1339  Plan  Clinical Impression Statement Today's skilled session focused on checking remainder of LTGs for re-cert. Pt did not meet LTG #3, pt  scored a 15/24 on the DGI today with SPC with quad tip, indicating that pt is still at an incr risk for falls. Performed gait outdoors with min guard with cane, however unable to tolerate much time outside due to heat and light sensitivity (unable to check LTG #4). Pt  reports that is he beginning to return to drumming at home and has partially met LTG #5. Pt did meet LTG #2 in regards to BERG. Will re-cert for an additional 2x week for 8 weeks to continue to work on strengthening, balance, gait with cane, transfers, ROM, in order to decr pt's fall risk and improve functional mobility and independence. STGs/LTGs revised and updated as appropriate.  Personal Factors and Comorbidities Age;Fitness;Time since onset of injury/illness/exacerbation;Comorbidity 1;Past/Current Experience  Comorbidities L Achilles tendon tear, chronic R LE edema, CAD s/p CABG, HTN, asthma, type 2 diabetes, CKD stage III, HLD, remote R retinal detachment, HTN, CVA.  Examination-Activity Limitations Squat;Stairs;Caring for Others;Locomotion Level  Examination-Participation Restrictions Cleaning;Community Activity;Driving;Occupation;Shop;Yard Work  Pt will benefit from skilled therapeutic intervention in order to improve on the following deficits Abnormal gait;Decreased range of motion;Difficulty walking;Increased fascial restricitons;Impaired tone;Decreased activity tolerance;Decreased balance;Decreased mobility;Decreased strength;Increased edema  Stability/Clinical Decision Making Evolving/Moderate complexity  Rehab Potential Good  PT Frequency 2x / week  PT Duration 8 weeks  PT Treatment/Interventions ADLs/Self Care Home Management;Aquatic Therapy;Cryotherapy;Electrical Stimulation;Moist Heat;DME Instruction;Gait training;Stair training;Functional mobility training;Therapeutic activities;Therapeutic exercise;Balance training;Neuromuscular re-education;Manual techniques;Patient/family education;Passive range of motion;Taping;Joint Manipulations;Vestibular  PT Next Visit Plan look @ HEP and update as appropriate, did not have time to do today. Continue gait training with SPC with quad tip - dynamic tasks. continue stair training. balance with head motions. Continue to progress R LE strengthening  -hamstring, (and working on active DF/PF). RLE SLS tasks.  PT Home Exercise Plan Access Code: [email or web address]; Walking program (09/17/20)- pt instructions  Consulted and Agree with Plan of Care Patient;Family member/caregiver    Patient will benefit from skilled therapeutic intervention in order to improve the following deficits and impairments:     Visit Diagnosis: Muscle weakness (generalized)  Unsteadiness on feet  Other abnormalities of gait and mobility     Problem List Patient Active Problem List   Diagnosis Date Noted   Left pontine stroke ([place]) 07/25/2020   Acute right hemiparesis ([place]) 07/25/2020   Essential hypertension    History of CVA (cerebrovascular accident)    CKD (chronic kidney disease), stage II    Diabetes mellitus type 2 in obese (HCC)    Dyslipidemia    CVA (cerebral vascular accident) ([place]) 07/21/2020   Cryptogenic stroke ([place]) 06/18/2020   Acute-on-chronic kidney injury ([place]) 05/28/2020   Stroke ([place]) 05/27/2020   Elevated troponin 01/13/2019   COVID-19 virus infection 01/13/2019   S/P CABG (coronary artery bypass graft)    CAD (coronary artery disease) 04/28/2011   Insulin dependent type 2 diabetes mellitus, controlled ([place])    Hyperlipidemia    Hypertension    Obesity (BMI 30-39.9)    Nephrolithiasis, uric acid     Arliss Journey, PT, DPT  10/08/2020, 9:22 PM  [facility] [address] [place], [place], [address] Phone: [phone]   Fax:  [phone]  Name: Mitchell Lambert MRN: [id number] Date of Birth: 1950/09/18

## 2020-10-10 ENCOUNTER — Ambulatory Visit: Payer: PRIVATE HEALTH INSURANCE

## 2020-10-10 ENCOUNTER — Encounter: Payer: PRIVATE HEALTH INSURANCE | Admitting: Occupational Therapy

## 2020-10-11 NOTE — Addendum Note (Signed)
Addended by: Arliss Journey on: 10/11/2020 01:53 PM   Modules accepted: Orders

## 2020-10-14 ENCOUNTER — Encounter: Payer: Self-pay | Admitting: Occupational Therapy

## 2020-10-14 ENCOUNTER — Ambulatory Visit: Payer: Medicare Other | Admitting: Occupational Therapy

## 2020-10-14 DIAGNOSIS — M7541 Impingement syndrome of right shoulder: Secondary | ICD-10-CM | POA: Diagnosis not present

## 2020-10-14 DIAGNOSIS — I639 Cerebral infarction, unspecified: Secondary | ICD-10-CM | POA: Diagnosis not present

## 2020-10-14 NOTE — Therapy (Signed)
[facility] [address] [place], [place], [address] Phone: [phone]   Fax:  [phone]  Occupational Therapy Treatment  Patient Details  Name: Mitchell Lambert MRN: [id number] Date of Birth: 05-08-1950 Referring Provider (OT): Dr. Leonie Man   Encounter Date: 10/14/2020   OT End of Session - 10/14/20 1901     Visit Number 13    Number of Visits 24    Date for OT Re-Evaluation 11/12/20    Authorization Type Medicare    Progress Note Due on Visit 69    OT Start Time 1330    OT Stop Time 1415    OT Time Calculation (min) 45 min    Activity Tolerance Patient tolerated treatment well    Behavior During Therapy WFL for tasks assessed/performed             Past Medical History:  Diagnosis Date   Angina    NO ANGINA SINCE CABG   Arthritis    right ankle   Chronic daily headache    "@ least every other day""improved since heart surgery"   Coronary artery disease    PT [facility] CABG SURGERY - DR. TILLEY IS HIS CARDIOLOGIST   Diabetes mellitus    Lantus x 10 yrs   Gout    History of kidney stones 09-08-12   multiple times with some lithotripsies   Hyperlipemia    Hypertension     Past Surgical History:  Procedure Laterality Date   BACK SURGERY     microsurgery: spinal stenosis -lumbar   bone spur  1990's   right great toe; "probably related to gout"   BUBBLE STUDY  06/17/2020   Procedure: BUBBLE STUDY;  Surgeon: Lelon Perla, MD;  Location: [facility];  Service: Cardiovascular;;   CARDIAC CATHETERIZATION  04/27/11   CATARACT EXTRACTION W/ INTRAOCULAR LENS IMPLANT  1990's   right; "lens was replaced twice over 3 months; lens is actually over iris"   CORONARY ARTERY BYPASS GRAFT  04/30/2011   Procedure: CORONARY ARTERY BYPASS GRAFTING (CABG);  Surgeon: Melrose Nakayama, MD;  Location: [facility];  Service: Open Heart Surgery;  Laterality: N/A;,x4 vessels   CYSTOSCOPY W/ URETERAL  STENT PLACEMENT Right 08/19/2012   Procedure: CYSTOSCOPY WITH RETROGRADE PYELOGRAM/URETERAL STENT PLACEMENT;  Surgeon: Molli Hazard, MD;  [facility];  Service: Urology;  Laterality: Right;   CYSTOSCOPY WITH RETROGRADE PYELOGRAM, URETEROSCOPY AND STENT PLACEMENT Left 07/17/2013   Procedure: CYSTOSCOPY, LEFT URETEROSCOPY, BASKET EXTRACTION OF STONE, INSERTION OF LEFT URETERAL STENT, ;  Surgeon: Jorja Loa, MD;  [facility];  Service: Urology;  Laterality: Left;   CYSTOSCOPY/RETROGRADE/URETEROSCOPY Right 09/12/2012   Procedure: CYSTOSCOPY/RETROGRADE/URETEROSCOPY;  Surgeon: Franchot Gallo, MD;  [facility];  Service: Urology;  Laterality: Right;   HOLMIUM LASER APPLICATION Right [id number]   Procedure: HOLMIUM LASER APPLICATION;  Surgeon: Franchot Gallo, MD;  [facility];  Service: Urology;  Laterality: Right;   HOLMIUM LASER APPLICATION Left [id number]   Procedure: HOLMIUM LASER APPLICATION;  Surgeon: Jorja Loa, MD;  [facility];  Service: Urology;  Laterality: Left;   KIDNEY STONE SURGERY     "probably 3 times"   LEFT HEART CATHETERIZATION WITH CORONARY ANGIOGRAM N/A 04/28/2011   Procedure: LEFT HEART CATHETERIZATION WITH CORONARY ANGIOGRAM;  Surgeon: Jacolyn Reedy, MD;  Location: [facility] C[facility];  Service: Cardiovascular;  Laterality: N/A;   LITHOTRIPSY     "many; probably 5 times"   [place]  04/30/2011   Procedure: RADIAL ARTERY HARVEST;  Surgeon: Melrose Nakayama, MD;  Location: [facility];  Service: Open Heart Surgery;  Laterality: Left;   RETINAL DETACHMENT SURGERY  1990's   right eye; "made me have an early cataract"   TEE WITHOUT CARDIOVERSION N/A 06/17/2020   Procedure: TRANSESOPHAGEAL ECHOCARDIOGRAM (TEE);  Surgeon: Lelon Perla, MD;  Location: [facility];  Service: Cardiovascular;  Laterality: N/A;   VASECTOMY      There were no vitals filed for this visit.   Subjective Assessment - 10/14/20 1900      Subjective  Patient indicated he did well getting onto and off of the boat with his friends help.    Pertinent History 70 y/o male presented to  ED for concern of dizziness and diplopia on 07/21/20. MRI showed acute L pontine CVA and prior L parietal infract.  PMH: CAD s/p CABG, HTN, asthma, type 2 diabetes, CKD stage III, HLD, remote R retinal detachment, HTN, CVA.    Limitations per MD ultrasound is ok as long as it is not over the site of loop recorder, TENS as long as it is not proximal to the coracoid    Patient Stated Goals regain use of RUE    Currently in Pain? No/denies    Pain Score 0-No pain            Patient seen for aquatic therapy visit.  Patient entered and exited pool via stairs and supervision assist.   Session occurred in 3.5-4.5 ft of water.   Worked initially on relaxation techniques in buoyancy assisted patterns.  Followed with buoyancy resisted work with water dumbbells - level 2.   Worked on gross motor coordination patterns with walking in chest deep water with resisted arm swing.   Patient worked on active stretching in modified prone position.  Patient able to assume fromt float position and kick across pool using BLE.  Patient became slightly short of breath with this increased activity - but recovered in seconds.   Worked on overhead reach, and climbing ladder out of pool (as needed for climbing into boat from water)  Patient demonstrated sufficient LE strength bilaterally to climb ladder, but was challenged for coordination of right foot into foot holds.                           OT Short Term Goals - 10/14/20 1903       OT SHORT TERM GOAL #1   Title I with HEP for vision, coordination    Time 4    Period Weeks    Status Achieved    Target Date 09/17/20      OT SHORT TERM GOAL #2   Title Pt will demonstrate improved RUE fine motor coordination for ADLS as evidenced by decreasing 9 hole peg test score to 52 secs or less    Time 4     Period Weeks    Status Achieved   09/26/20 - 50 sec 1st trial; 34 sec 2nd trial     OT SHORT TERM GOAL #3   Title Pt will report that he is cutting food independently and  feeding himself with RUE at least 75% of the time.    Time 4    Period Weeks    Status Achieved      OT SHORT TERM GOAL #4   Title Pt will perfrom all basic ADLS with distant supervision.    Time 4  Period Weeks    Status Achieved      OT SHORT TERM GOAL #5   Title Pt will demonstrate ability to retrieve a lightweight object at 110* shoulder flexion with good control and min compensation.    Time 4    Period Weeks    Status Achieved   120     OT SHORT TERM GOAL #6   Title Pt will increase RUE grip strength to 45 lbs or greater for increased functional use.    Time 4    Period Weeks    Status Achieved   56.4 lbs     OT SHORT TERM GOAL #7   Title Pt will verbalzie understanding of compensatroy strategies for visual deficits    Time 4    Period Weeks    Status Achieved               OT Long Term Goals - 10/14/20 1903       OT LONG TERM GOAL #1   Title I with all basic ADLS.    Time 12    Period Weeks    Status On-going      OT LONG TERM GOAL #2   Title Pt will demonstrate improved fine motor coordination for ADLs as evidenced by performing 9 hole peg test in 45 secs or less.    Time 12    Period Weeks    Status On-going      OT LONG TERM GOAL #3   Title Pt will perform simple snack/ beverage prep modified Independently.    Time 12    Period Weeks    Status On-going      OT LONG TERM GOAL #4   Title Pt will retrieve a 3 lbs object with RUE at 115 shoulder flexion with good control.    Time 12    Period Weeks    Status On-going      OT LONG TERM GOAL #5   Title Pt will perfrom simulated work activities modified Independently    Time 12    Period Weeks    Status On-going      OT LONG TERM GOAL #6   Title Pt will perfrom tabletop and environmental scanning with 90% or better  accuracy in prep for work activities.    Time 12    Period Weeks    Status On-going                   Plan - 10/14/20 1901     Clinical Impression Statement Patient reports sleeping better at night on his back.  He reports some discomfort in his right arm when sleeping on his left side.  Discussed placing pillow under right arm if sleeping on left side.    OT Occupational Profile and History Detailed Assessment- Review of Records and additional review of physical, cognitive, psychosocial history related to current functional performance    Occupational performance deficits (Please refer to evaluation for details): ADL's;IADL's;Play;Work;Leisure;Social Participation    Body Structure / Function / Physical Skills ADL;Balance;Endurance;Mobility;Strength;Flexibility;UE functional use;FMC;Coordination;Gait;Vision;ROM;GMC;Decreased knowledge of precautions;Decreased knowledge of use of DME;Dexterity;IADL    Rehab Potential Good    Clinical Decision Making Limited treatment options, no task modification necessary    Comorbidities Affecting Occupational Performance: May have comorbidities impacting occupational performance    Modification or Assistance to Complete Evaluation  No modification of tasks or assist necessary to complete eval    OT Frequency 2x / week    OT Duration 12  weeks    OT Treatment/Interventions Self-care/ADL training;Therapeutic exercise;Functional Mobility Training;Balance training;Ultrasound;Neuromuscular education;Manual Therapy;Therapeutic activities;Paraffin;Cryotherapy;DME and/or AE instruction;Cognitive remediation/compensation;Visual/perceptual remediation/compensation;Fluidtherapy;Moist Heat;Electrical Stimulation;Passive range of motion;Patient/family education;Energy conservation;Aquatic Therapy;Gait Training    Plan aquatic therapy, NMR, address shoulder pain, regain range of motion and strength, and normal movement patterns    Consulted and Agree with Plan of  Care Patient             Patient will benefit from skilled therapeutic intervention in order to improve the following deficits and impairments:   Body Structure / Function / Physical Skills: ADL, Balance, Endurance, Mobility, Strength, Flexibility, UE functional use, FMC, Coordination, Gait, Vision, ROM, GMC, Decreased knowledge of precautions, Decreased knowledge of use of DME, Dexterity, IADL       Visit Diagnosis: Acute right hemiparesis (HCC)  Muscle weakness (generalized)  Unsteadiness on feet  Other lack of coordination  Visuospatial deficit    Problem List Patient Active Problem List   Diagnosis Date Noted   Left pontine stroke ([place]) 07/25/2020   Acute right hemiparesis ([place]) 07/25/2020   Essential hypertension    History of CVA (cerebrovascular accident)    CKD (chronic kidney disease), stage II    Diabetes mellitus type 2 in obese ([place])    Dyslipidemia    CVA (cerebral vascular accident) ([place]) 07/21/2020   Cryptogenic stroke ([place]) 06/18/2020   Acute-on-chronic kidney injury ([place]) 05/28/2020   Stroke ([place]) 05/27/2020   Elevated troponin 01/13/2019   COVID-19 virus infection 01/13/2019   S/P CABG (coronary artery bypass graft)    CAD (coronary artery disease) 04/28/2011   Insulin dependent type 2 diabetes mellitus, controlled ([place])    Hyperlipidemia    Hypertension    Obesity (BMI 30-39.9)    Nephrolithiasis, uric acid     Mariah Milling 10/14/2020, 7:04 PM  [facility] [address] [place], [place], [address] Phone: [phone]   Fax:  [phone]  Name: Mitchell Lambert MRN: [id number] Date of Birth: 17-Sep-1950

## 2020-10-18 ENCOUNTER — Ambulatory Visit: Payer: Medicare Other | Admitting: Occupational Therapy

## 2020-10-18 ENCOUNTER — Ambulatory Visit: Payer: Medicare Other

## 2020-10-21 ENCOUNTER — Encounter: Payer: Self-pay | Admitting: Occupational Therapy

## 2020-10-21 ENCOUNTER — Ambulatory Visit: Payer: Medicare Other | Admitting: Occupational Therapy

## 2020-10-21 DIAGNOSIS — M6281 Muscle weakness (generalized): Secondary | ICD-10-CM

## 2020-10-21 DIAGNOSIS — R41842 Visuospatial deficit: Secondary | ICD-10-CM

## 2020-10-21 DIAGNOSIS — M7541 Impingement syndrome of right shoulder: Secondary | ICD-10-CM | POA: Diagnosis not present

## 2020-10-21 DIAGNOSIS — R2681 Unsteadiness on feet: Secondary | ICD-10-CM

## 2020-10-21 DIAGNOSIS — R278 Other lack of coordination: Secondary | ICD-10-CM

## 2020-10-21 DIAGNOSIS — G8191 Hemiplegia, unspecified affecting right dominant side: Secondary | ICD-10-CM

## 2020-10-21 DIAGNOSIS — I639 Cerebral infarction, unspecified: Secondary | ICD-10-CM | POA: Diagnosis not present

## 2020-10-21 NOTE — Therapy (Signed)
[facility] [address] [place], [place], [address] Phone: [phone]   Fax:  [phone]  Occupational Therapy Treatment  Patient Details  Name: Mitchell Lambert MRN: [id number] Date of Birth: 04/16/1950 Referring Provider (OT): Dr. Leonie Man   Encounter Date: 10/21/2020   OT End of Session - 10/21/20 1450     Visit Number 14    Number of Visits 24    Date for OT Re-Evaluation 11/12/20    Authorization Type Medicare    Progress Note Due on Visit 20    OT Start Time 1320    OT Stop Time 1415    OT Time Calculation (min) 55 min    Activity Tolerance Patient tolerated treatment well    Behavior During Therapy WFL for tasks assessed/performed             Past Medical History:  Diagnosis Date   Angina    NO ANGINA SINCE CABG   Arthritis    right ankle   Chronic daily headache    "@ least every other day""improved since heart surgery"   Coronary artery disease    PT [facility] CABG SURGERY - DR. TILLEY IS HIS CARDIOLOGIST   Diabetes mellitus    Lantus x 10 yrs   Gout    History of kidney stones 09-08-12   multiple times with some lithotripsies   Hyperlipemia    Hypertension     Past Surgical History:  Procedure Laterality Date   BACK SURGERY     microsurgery: spinal stenosis -lumbar   bone spur  1990's   right great toe; "probably related to gout"   BUBBLE STUDY  06/17/2020   Procedure: BUBBLE STUDY;  Surgeon: Lelon Perla, MD;  Location: [facility];  Service: Cardiovascular;;   CARDIAC CATHETERIZATION  04/27/11   CATARACT EXTRACTION W/ INTRAOCULAR LENS IMPLANT  1990's   right; "lens was replaced twice over 3 months; lens is actually over iris"   CORONARY ARTERY BYPASS GRAFT  04/30/2011   Procedure: CORONARY ARTERY BYPASS GRAFTING (CABG);  Surgeon: Melrose Nakayama, MD;  Location: [facility];  Service: Open Heart Surgery;  Laterality: N/A;,x4 vessels   CYSTOSCOPY W/ URETERAL  STENT PLACEMENT Right 08/19/2012   Procedure: CYSTOSCOPY WITH RETROGRADE PYELOGRAM/URETERAL STENT PLACEMENT;  Surgeon: Molli Hazard, MD;  [facility];  Service: Urology;  Laterality: Right;   CYSTOSCOPY WITH RETROGRADE PYELOGRAM, URETEROSCOPY AND STENT PLACEMENT Left 07/17/2013   Procedure: CYSTOSCOPY, LEFT URETEROSCOPY, BASKET EXTRACTION OF STONE, INSERTION OF LEFT URETERAL STENT, ;  Surgeon: Jorja Loa, MD;  [facility];  Service: Urology;  Laterality: Left;   CYSTOSCOPY/RETROGRADE/URETEROSCOPY Right 09/12/2012   Procedure: CYSTOSCOPY/RETROGRADE/URETEROSCOPY;  Surgeon: Franchot Gallo, MD;  [facility];  Service: Urology;  Laterality: Right;   HOLMIUM LASER APPLICATION Right [id number]   Procedure: HOLMIUM LASER APPLICATION;  Surgeon: Franchot Gallo, MD;  [facility];  Service: Urology;  Laterality: Right;   HOLMIUM LASER APPLICATION Left [id number]   Procedure: HOLMIUM LASER APPLICATION;  Surgeon: Jorja Loa, MD;  [facility];  Service: Urology;  Laterality: Left;   KIDNEY STONE SURGERY     "probably 3 times"   LEFT HEART CATHETERIZATION WITH CORONARY ANGIOGRAM N/A 04/28/2011   Procedure: LEFT HEART CATHETERIZATION WITH CORONARY ANGIOGRAM;  Surgeon: Jacolyn Reedy, MD;  Location: [facility];  Service: Cardiovascular;  Laterality: N/A;   LITHOTRIPSY     "many; probably 5 times"   [place]  04/30/2011   Procedure: RADIAL ARTERY HARVEST;  Surgeon: Melrose Nakayama, MD;  Location: [facility];  Service: Open Heart Surgery;  Laterality: Left;   RETINAL DETACHMENT SURGERY  1990's   right eye; "made me have an early cataract"   TEE WITHOUT CARDIOVERSION N/A 06/17/2020   Procedure: TRANSESOPHAGEAL ECHOCARDIOGRAM (TEE);  Surgeon: Lelon Perla, MD;  Location: [facility]OS[facility];  Service: Cardiovascular;  Laterality: N/A;   VASECTOMY      There were no vitals filed for this visit.   Subjective Assessment - 10/21/20 1449      Subjective  Patient had a small cut on his right leg - he bumped it in theater.    Pertinent History 70 y/o male presented to  ED for concern of dizziness and diplopia on 07/21/20. MRI showed acute L pontine CVA and prior L parietal infract.  PMH: CAD s/p CABG, HTN, asthma, type 2 diabetes, CKD stage III, HLD, remote R retinal detachment, HTN, CVA.    Limitations per MD ultrasound is ok as long as it is not over the site of loop recorder, TENS as long as it is not proximal to the coracoid    Patient Stated Goals regain use of RUE    Currently in Pain? No/denies    Pain Score 0-No pain             Patient seen for aquatic therapy visit.  Patient walking on deck without shoes or walker with contact guard assistance.  Session occurred in 3.5-4 ft of water.  Emphasis today on buoyancy resisted exercise to improve proximal strength in BUE.  Also use of turbulence to challenge balance.  Ai Chi exercises through Accepting this visit.  Patient with greater loss of balance as LE weight shifting or whole body movements involved.  Patient did best with slow repetitive movements.  Modified plank/push up on steps, and working on one legged stance with underwater step.                         OT Short Term Goals - 10/21/20 1451       OT SHORT TERM GOAL #1   Title I with HEP for vision, coordination    Time 4    Period Weeks    Status Achieved    Target Date 09/17/20      OT SHORT TERM GOAL #2   Title Pt will demonstrate improved RUE fine motor coordination for ADLS as evidenced by decreasing 9 hole peg test score to 52 secs or less    Time 4    Period Weeks    Status Achieved   09/26/20 - 50 sec 1st trial; 34 sec 2nd trial     OT SHORT TERM GOAL #3   Title Pt will report that he is cutting food independently and  feeding himself with RUE at least 75% of the time.    Time 4    Period Weeks    Status Achieved      OT SHORT TERM GOAL #4   Title Pt will perfrom all basic ADLS  with distant supervision.    Time 4    Period Weeks    Status Achieved      OT SHORT TERM GOAL #5   Title Pt will demonstrate ability to retrieve a lightweight object at 110* shoulder flexion with good control and min compensation.    Time 4    Period Weeks    Status Achieved  120     OT SHORT TERM GOAL #6   Title Pt will increase RUE grip strength to 45 lbs or greater for increased functional use.    Time 4    Period Weeks    Status Achieved   56.4 lbs     OT SHORT TERM GOAL #7   Title Pt will verbalzie understanding of compensatroy strategies for visual deficits    Time 4    Period Weeks    Status Achieved               OT Long Term Goals - 10/21/20 1451       OT LONG TERM GOAL #1   Title I with all basic ADLS.    Time 12    Period Weeks    Status On-going      OT LONG TERM GOAL #2   Title Pt will demonstrate improved fine motor coordination for ADLs as evidenced by performing 9 hole peg test in 45 secs or less.    Time 12    Period Weeks    Status On-going      OT LONG TERM GOAL #3   Title Pt will perform simple snack/ beverage prep modified Independently.    Time 12    Period Weeks    Status On-going      OT LONG TERM GOAL #4   Title Pt will retrieve a 3 lbs object with RUE at 115 shoulder flexion with good control.    Time 12    Period Weeks    Status On-going      OT LONG TERM GOAL #5   Title Pt will perfrom simulated work activities modified Independently    Time 12    Period Weeks    Status On-going      OT LONG TERM GOAL #6   Title Pt will perfrom tabletop and environmental scanning with 90% or better accuracy in prep for work activities.    Time 12    Period Weeks    Status On-going                   Plan - 10/21/20 1450     Clinical Impression Statement Patient reports only occasional shoulder discomfort that he attributes to positioning at night during sleep.    OT Occupational Profile and History Detailed Assessment-  Review of Records and additional review of physical, cognitive, psychosocial history related to current functional performance    Occupational performance deficits (Please refer to evaluation for details): ADL's;IADL's;Play;Work;Leisure;Social Participation    Body Structure / Function / Physical Skills ADL;Balance;Endurance;Mobility;Strength;Flexibility;UE functional use;FMC;Coordination;Gait;Vision;ROM;GMC;Decreased knowledge of precautions;Decreased knowledge of use of DME;Dexterity;IADL    Rehab Potential Good    Clinical Decision Making Limited treatment options, no task modification necessary    Comorbidities Affecting Occupational Performance: May have comorbidities impacting occupational performance    Modification or Assistance to Complete Evaluation  No modification of tasks or assist necessary to complete eval    OT Frequency 2x / week    OT Duration 12 weeks    OT Treatment/Interventions Self-care/ADL training;Therapeutic exercise;Functional Mobility Training;Balance training;Ultrasound;Neuromuscular education;Manual Therapy;Therapeutic activities;Paraffin;Cryotherapy;DME and/or AE instruction;Cognitive remediation/compensation;Visual/perceptual remediation/compensation;Fluidtherapy;Moist Heat;Electrical Stimulation;Passive range of motion;Patient/family education;Energy conservation;Aquatic Therapy;Gait Training    Plan aquatic therapy, NMR, address shoulder pain, regain range of motion and strength, and normal movement patterns    Consulted and Agree with Plan of Care Patient             Patient will benefit from skilled  therapeutic intervention in order to improve the following deficits and impairments:   Body Structure / Function / Physical Skills: ADL, Balance, Endurance, Mobility, Strength, Flexibility, UE functional use, FMC, Coordination, Gait, Vision, ROM, GMC, Decreased knowledge of precautions, Decreased knowledge of use of DME, Dexterity, IADL       Visit  Diagnosis: Acute right hemiparesis (HCC)  Muscle weakness (generalized)  Unsteadiness on feet  Other lack of coordination  Visuospatial deficit    Problem List Patient Active Problem List   Diagnosis Date Noted   Left pontine stroke ([place]) 07/25/2020   Acute right hemiparesis ([place]) 07/25/2020   Essential hypertension    History of CVA (cerebrovascular accident)    CKD (chronic kidney disease), stage II    Diabetes mellitus type 2 in obese ([place])    Dyslipidemia    CVA (cerebral vascular accident) ([place]) 07/21/2020   Cryptogenic stroke ([place]) 06/18/2020   Acute-on-chronic kidney injury ([place]) 05/28/2020   Stroke ([place]) 05/27/2020   Elevated troponin 01/13/2019   COVID-19 virus infection 01/13/2019   S/P CABG (coronary artery bypass graft)    CAD (coronary artery disease) 04/28/2011   Insulin dependent type 2 diabetes mellitus, controlled ([place])    Hyperlipidemia    Hypertension    Obesity (BMI 30-39.9)    Nephrolithiasis, uric acid     Mariah Milling 10/21/2020, 2:54 PM  [facility] [address] [place], [place], [address] Phone: [phone]   Fax:  [phone]  Name: Mitchell Lambert MRN: [id number] Date of Birth: [birth year]

## 2020-10-22 ENCOUNTER — Other Ambulatory Visit: Payer: Self-pay

## 2020-10-22 NOTE — Progress Notes (Signed)
[facility] Summary Report / Loop Recorder 

## 2020-10-23 ENCOUNTER — Ambulatory Visit: Payer: Medicare Other | Admitting: Physical Therapy

## 2020-10-23 ENCOUNTER — Ambulatory Visit: Payer: Medicare Other | Admitting: Occupational Therapy

## 2020-10-24 DIAGNOSIS — H4922 Sixth [abducent] nerve palsy, left eye: Secondary | ICD-10-CM | POA: Diagnosis not present

## 2020-10-24 DIAGNOSIS — H532 Diplopia: Secondary | ICD-10-CM | POA: Diagnosis not present

## 2020-10-25 ENCOUNTER — Ambulatory Visit: Payer: Medicare Other

## 2020-10-25 ENCOUNTER — Other Ambulatory Visit: Payer: Self-pay

## 2020-10-25 DIAGNOSIS — I639 Cerebral infarction, unspecified: Secondary | ICD-10-CM | POA: Diagnosis not present

## 2020-10-25 DIAGNOSIS — R2681 Unsteadiness on feet: Secondary | ICD-10-CM

## 2020-10-25 DIAGNOSIS — R2689 Other abnormalities of gait and mobility: Secondary | ICD-10-CM

## 2020-10-25 DIAGNOSIS — M7541 Impingement syndrome of right shoulder: Secondary | ICD-10-CM | POA: Diagnosis not present

## 2020-10-25 DIAGNOSIS — M6281 Muscle weakness (generalized): Secondary | ICD-10-CM

## 2020-10-25 NOTE — Therapy (Signed)
[facility] [address] [place], [place], [address] Phone: [phone]   Fax:  [phone]  Physical Therapy Treatment  Patient Details  Name: Mitchell Lambert MRN: [id number] Date of Birth: Jan 06, 1951 Referring Provider (PT): Garvin Fila, MD   Encounter Date: 10/25/2020   PT End of Session - 10/25/20 1324     Visit Number 12    Number of Visits 27    Date for PT Re-Evaluation [id number]   per re-cert on 0000000   Authorization Type Medicare - will need 10th visit PN    Progress Note Due on Visit 20    PT Start Time 1321   patient arriving late   PT Stop Time 1400    PT Time Calculation (min) 39 min    Equipment Utilized During Treatment Gait belt    Activity Tolerance Patient tolerated treatment well    Behavior During Therapy WFL for tasks assessed/performed             Past Medical History:  Diagnosis Date   Angina    NO ANGINA SINCE CABG   Arthritis    right ankle   Chronic daily headache    "@ least every other day""improved since heart surgery"   Coronary artery disease    PT [facility] CABG SURGERY - DR. TILLEY IS HIS CARDIOLOGIST   Diabetes mellitus    Lantus x 10 yrs   Gout    History of kidney stones 09-08-12   multiple times with some lithotripsies   Hyperlipemia    Hypertension     Past Surgical History:  Procedure Laterality Date   BACK SURGERY     microsurgery: spinal stenosis -lumbar   bone spur  1990's   right great toe; "probably related to gout"   BUBBLE STUDY  06/17/2020   Procedure: BUBBLE STUDY;  Surgeon: Lelon Perla, MD;  Location: [facility];  Service: Cardiovascular;;   CARDIAC CATHETERIZATION  04/27/11   CATARACT EXTRACTION W/ INTRAOCULAR LENS IMPLANT  1990's   right; "lens was replaced twice over 3 months; lens is actually over iris"   CORONARY ARTERY BYPASS GRAFT  04/30/2011   Procedure: CORONARY ARTERY BYPASS GRAFTING (CABG);  Surgeon:  Melrose Nakayama, MD;  Location: [facility];  Service: Open Heart Surgery;  Laterality: N/A;,x4 vessels   CYSTOSCOPY W/ URETERAL STENT PLACEMENT Right 08/19/2012   Procedure: CYSTOSCOPY WITH RETROGRADE PYELOGRAM/URETERAL STENT PLACEMENT;  Surgeon: Molli Hazard, MD;  Location: [facility];  Service: Urology;  Laterality: Right;   CYSTOSCOPY WITH RETROGRADE PYELOGRAM, URETEROSCOPY AND STENT PLACEMENT Left 07/17/2013   Procedure: CYSTOSCOPY, LEFT URETEROSCOPY, BASKET EXTRACTION OF STONE, INSERTION OF LEFT URETERAL STENT, ;  Surgeon: Jorja Loa, MD;  Location: [facility];  Service: Urology;  Laterality: Left;   CYSTOSCOPY/RETROGRADE/URETEROSCOPY Right 09/12/2012   Procedure: CYSTOSCOPY/RETROGRADE/URETEROSCOPY;  Surgeon: Franchot Gallo, MD;  Location: [facility];  Service: Urology;  Laterality: Right;   HOLMIUM LASER APPLICATION Right [id number]   Procedure: HOLMIUM LASER APPLICATION;  Surgeon: Franchot Gallo, MD;  Location: [facility];  Service: Urology;  Laterality: Right;   HOLMIUM LASER APPLICATION Left [id number]   Procedure: HOLMIUM LASER APPLICATION;  Surgeon: Jorja Loa, MD;  Location: [facility];  Service: Urology;  Laterality: Left;   KIDNEY STONE SURGERY     "probably 3 times"   LEFT HEART CATHETERIZATION WITH CORONARY ANGIOGRAM N/A 04/28/2011   Procedure: LEFT HEART CATHETERIZATION WITH CORONARY ANGIOGRAM;  Surgeon: Jacolyn Reedy, MD;  Location:  [facility];  Service: Cardiovascular;  Laterality: N/A;   LITHOTRIPSY     "many; probably 5 times"   RADIAL ARTERY HARVEST  04/30/2011   Procedure: RADIAL ARTERY HARVEST;  Surgeon: Melrose Nakayama, MD;  Location: [facility];  Service: Open Heart Surgery;  Laterality: Left;   RETINAL DETACHMENT SURGERY  1990's   right eye; "made me have an early cataract"   TEE WITHOUT CARDIOVERSION N/A 06/17/2020   Procedure: TRANSESOPHAGEAL ECHOCARDIOGRAM (TEE);  Surgeon: Lelon Perla, MD;  Location: [facility];  Service: Cardiovascular;   Laterality: N/A;   VASECTOMY      There were no vitals filed for this visit.   Subjective Assessment - 10/25/20 1325     Subjective Patient reports still feel comfortable walking with SPC, but walks into session RW. would like to walk outside today if possible. No falls. No pain.    Patient is accompained by: Family member    Pertinent History achilles tendon tear on L    Limitations Standing;Sitting;House hold activities    How long can you stand comfortably? No issues    How long can you walk comfortably? In house only    Patient Stated Goals Return to their own home, play drums again    Currently in Pain? No/denies    Pain Onset More than a month ago            Completed entire review of HEP, progressing to patient's tolerance. Bolded are new additions/updates.   Access Code: [email or web address] URL: [email or web address] Date: 10/25/2020 Prepared by: Baldomero Lamy  Exercises Clamshell - 1 x daily - 7 x weekly - 3 sets - 10 reps Supine Bridge - 1 x daily - 7 x weekly - 3 sets - 10 reps Supine Active Straight Leg Raise - 1 x daily - 7 x weekly - 3 sets - 10 reps Standing Soleus Stretch on Step - 4 x daily - 7 x weekly - 30 sec hold Standing Gastroc Stretch - 1 x daily - 7 x weekly - 1 sets - 3 reps - 30 seconds hold Seated Knee Extension with Resistance - 1 x daily - 7 x weekly - 3 sets - 10 reps - with GREEN Theraband Seated Knee Flexion with Anchored Resistance - 1 x daily - 7 x weekly - 3 sets - 10 reps - with GREEN Theraband Side Stepping with Resistance at Thighs and Counter Support - 1 x daily - 7 x weekly - 1 sets - 3-4 reps - with RED Theraband     [facility] Adult PT Treatment/Exercise - 10/25/20 0001       Ambulation/Gait   Ambulation/Gait Yes    Ambulation/Gait Assistance 5: Supervision;4: Min guard    Ambulation/Gait Assistance Details completed gait training outdoors on paved surfaces x 600 ft with supervision, one instance of CGA required to catching R Toe  but able to maintain balance without assistance from PT. PT educating on beginning to ambulate short distances with SPC on paved surface with family member/wife present. Patient agreeable.    Ambulation Distance (Feet) 600 Feet    Assistive device Straight cane;Other (Comment)    Gait Pattern Step-through pattern;Decreased stance time - right;Decreased dorsiflexion - right;Decreased weight shift to right;Right foot flat    Ambulation Surface Level;Indoor;Unlevel;Outdoor;Paved    Curb 5: Supervision    Gait Comments worked on Psychologist, educational: patient require min guard initially, then progressed to supervision on last few reps. Did require cues for sequencing and cane placement, performed x 6  reps indoors, patient demo ability to clear RLE.      Neuro Re-ed    Neuro Re-ed Details  work on [employer] with SPC stepping over black balance beams x 5 laps down and back, able to clear when leading with SPC and RLE easily, but increased challenge with clearance when RLE is trailing due to decreased hip/knee flexion. Close supervision with completion.      Exercises   Exercises Other Exercises    Other Exercises  Reviewed entire HEP and progressed to patient's tolerance. See medbridge for details.               PT Education - 10/25/20 1342     Education Details Updated HEP; begin to try short distance outdoors with [facility] on level paved surfaces with wife/family member present    Person(s) Educated Patient    Methods Explanation;Demonstration;Handout    Comprehension Verbalized understanding;Returned demonstration              PT Short Term Goals - 10/11/20 1347       PT SHORT TERM GOAL #1   Title Pt will perform 8 steps with SPC and single handrail with supervision in order to demo improved community access. ALL STGS DUE 11/08/20    Time 4    Period Weeks    Status New    Target Date 11/08/20      PT SHORT TERM GOAL #2   Title Pt will perform a curb with SPC with quad tip and  supervision in order to demo improved community mobility.    Baseline requires min guard    Time 4    Period Weeks    Status New      PT SHORT TERM GOAL #3   Title Pt will decr TUG time with SPC to at least 12 seconds or less to demo decr fall risk.    Baseline 13.59 seconds    Time 4    Period Weeks    Status New      PT SHORT TERM GOAL #4   Title Pt will ambulate at least 250' outdoors with [facility] with quad tip and supervision in order to demo improved community mobility.    Baseline needs min guard, limited distances outdoors due to light sensitivity    Time 4    Period Weeks    Status New               PT Long Term Goals - 10/11/20 1350       PT LONG TERM GOAL #1   Title Pt will be independent with advanced HEP for strengthening and balance ALL LTGS DUE 12/06/20    Baseline did not have time to assess, pt will benefit from ongoing additions/updates    Time 8    Period Weeks    Status On-going    Target Date 12/06/20      PT LONG TERM GOAL #2   Title Pt will improve gait speed with cane to at least 2.75 ft/sec in order to demo improved community mobility.    Baseline 2.34 ft/sec    Time 8    Period Weeks    Status New      PT LONG TERM GOAL #3   Title Pt will have improved DGI score to at least 19/24 to demo decreased fall risk    Baseline 17/24; 15/24 ON 10/08/20    Time 8    Period Weeks    Status On-going  PT LONG TERM GOAL #4   Title Pt will be able to amb at least 500' with LRAD mod I on level and unlevel surfaces for community amb    Baseline need to use RW for longer outdoor distances, did not have time to do distance outdoors today/too hot    Time 8    Period Weeks    Status Revised      PT LONG TERM GOAL #5   Title Pt will be able to use R foot/ankle for drumming to return to leisure activities    Baseline has been trying to use his drums at home    Time 8    Period Weeks    Status On-going                   Plan - 10/25/20 1409      Clinical Impression Statement Today's skilled PT session focused on reviewing and progressing HEP, with patient tolerating progression of resistance and standing activities well. Updated HEP and provided new handout. Continued gait training with SPC on outdoors, as well as curb management and negotiation over obstacles. most challenge noted with clearign RLE over obstacle when trailing. Will continue to progress toward all goals.    Personal Factors and Comorbidities Age;Fitness;Time since onset of injury/illness/exacerbation;Comorbidity 1;Past/Current Experience    Comorbidities L Achilles tendon tear, chronic R LE edema, CAD s/p CABG, HTN, asthma, type 2 diabetes, CKD stage III, HLD, remote R retinal detachment, HTN, CVA.    Examination-Activity Limitations Squat;Stairs;Caring for Others;Locomotion Level    Examination-Participation Restrictions Cleaning;Community Activity;Driving;Occupation;Shop;Yard Work    [employer] Evolving/Moderate complexity    Rehab Potential Good    PT Frequency 2x / week    PT Duration 8 weeks    PT Treatment/Interventions ADLs/Self Care Home Management;Aquatic Therapy;Cryotherapy;Electrical Stimulation;Moist Heat;DME Instruction;Gait training;Stair training;Functional mobility training;Therapeutic activities;Therapeutic exercise;Balance training;Neuromuscular re-education;Manual techniques;Patient/family education;Passive range of motion;Taping;Joint Manipulations;Vestibular    PT Next Visit Plan How was HEP? Continue gait training with SPC with quad tip - dynamic tasks. continue stair training. balance with head motions. Continue to progress R LE strengthening -hamstring, (and working on active DF/PF). RLE SLS tasks.    PT Home Exercise Plan Access Code: [email or web address]; Walking program (09/17/20)- pt instructions    Consulted and Agree with Plan of Care Patient;Family member/caregiver             Patient will benefit from skilled therapeutic  intervention in order to improve the following deficits and impairments:  Abnormal gait, Decreased range of motion, Difficulty walking, Increased fascial restricitons, Impaired tone, Decreased activity tolerance, Decreased balance, Decreased mobility, Decreased strength, Increased edema  Visit Diagnosis: Muscle weakness (generalized)  Other abnormalities of gait and mobility  Unsteadiness on feet     Problem List Patient Active Problem List   Diagnosis Date Noted   Left pontine stroke ([facility]) 07/25/2020   Acute right hemiparesis ([facility]) 07/25/2020   Essential hypertension    History of CVA (cerebrovascular accident)    CKD (chronic kidney disease), stage II    Diabetes mellitus type 2 in obese ([facility])    Dyslipidemia    CVA (cerebral vascular accident) ([place]) 07/21/2020   Cryptogenic stroke ([place]) 06/18/2020   Acute-on-chronic kidney injury ([facility]) 05/28/2020   Stroke ([place]) 05/27/2020   Elevated troponin 01/13/2019   COVID-19 virus infection 01/13/2019   S/P CABG (coronary artery bypass graft)    CAD (coronary artery disease) 04/28/2011   Insulin dependent type 2 diabetes mellitus, controlled ([place])  Hyperlipidemia    Hypertension    Obesity (BMI 30-39.9)    Nephrolithiasis, uric acid     Jones Bales, PT, DPT 10/25/2020, 2:12 PM  [facility] [address] [place], [place], [address] Phone: [phone]   Fax:  [phone]  Name: Mitchell Lambert MRN: [id number] Date of Birth: 11/05/1950

## 2020-10-25 NOTE — Patient Instructions (Signed)
Access Code: [email or web address] URL: [email or web address] Date: 10/25/2020 Prepared by: Baldomero Lamy  Exercises Clamshell - 1 x daily - 7 x weekly - 3 sets - 10 reps Supine Bridge - 1 x daily - 7 x weekly - 3 sets - 10 reps Supine Active Straight Leg Raise - 1 x daily - 7 x weekly - 3 sets - 10 reps Standing Soleus Stretch on Step - 4 x daily - 7 x weekly - 30 sec hold Standing Gastroc Stretch - 1 x daily - 7 x weekly - 1 sets - 3 reps - 30 seconds hold Seated Knee Extension with Resistance - 1 x daily - 7 x weekly - 3 sets - 10 reps Seated Knee Flexion with Anchored Resistance - 1 x daily - 7 x weekly - 3 sets - 10 reps Side Stepping with Resistance at Thighs and Counter Support - 1 x daily - 7 x weekly - 1 sets - 3-4 reps

## 2020-10-28 ENCOUNTER — Ambulatory Visit: Payer: Medicare Other | Admitting: Occupational Therapy

## 2020-10-28 ENCOUNTER — Other Ambulatory Visit: Payer: Self-pay

## 2020-10-28 DIAGNOSIS — G8191 Hemiplegia, unspecified affecting right dominant side: Secondary | ICD-10-CM

## 2020-10-28 DIAGNOSIS — R278 Other lack of coordination: Secondary | ICD-10-CM

## 2020-10-28 DIAGNOSIS — M7541 Impingement syndrome of right shoulder: Secondary | ICD-10-CM | POA: Diagnosis not present

## 2020-10-28 DIAGNOSIS — M6281 Muscle weakness (generalized): Secondary | ICD-10-CM

## 2020-10-28 DIAGNOSIS — I639 Cerebral infarction, unspecified: Secondary | ICD-10-CM | POA: Diagnosis not present

## 2020-10-28 DIAGNOSIS — R41842 Visuospatial deficit: Secondary | ICD-10-CM

## 2020-10-28 DIAGNOSIS — R2681 Unsteadiness on feet: Secondary | ICD-10-CM

## 2020-10-29 ENCOUNTER — Encounter: Payer: Self-pay | Admitting: Occupational Therapy

## 2020-10-29 ENCOUNTER — Ambulatory Visit (INDEPENDENT_AMBULATORY_CARE_PROVIDER_SITE_OTHER): Payer: Medicare Other

## 2020-10-29 DIAGNOSIS — I639 Cerebral infarction, unspecified: Secondary | ICD-10-CM | POA: Diagnosis not present

## 2020-10-29 LAB — CUP PACEART REMOTE DEVICE CHECK
Date Time Interrogation Session: 20220830085608
Implantable Pulse Generator Implant Date: 20220419

## 2020-10-29 NOTE — Therapy (Signed)
[facility] [address] [place], [place], [address] Phone: [phone]   Fax:  [phone]  Occupational Therapy Treatment  Patient Details  Name: Mitchell Lambert MRN: [id number] Date of Birth: 31-Dec-1950 Referring Provider (OT): Dr. Leonie Man   Encounter Date: 10/28/2020   OT End of Session - 10/29/20 0701     Visit Number 15    Number of Visits 24    Date for OT Re-Evaluation 11/12/20    Authorization Type Medicare    Progress Note Due on Visit 20    OT Start Time 1330    OT Stop Time 1415    OT Time Calculation (min) 45 min    Equipment Utilized During Treatment water dumbbells    Activity Tolerance Patient tolerated treatment well    Behavior During Therapy WFL for tasks assessed/performed             Past Medical History:  Diagnosis Date   Angina    NO ANGINA SINCE CABG   Arthritis    right ankle   Chronic daily headache    "@ least every other day""improved since heart surgery"   Coronary artery disease    PT [place] CABG SURGERY - DR. TILLEY IS HIS CARDIOLOGIST   Diabetes mellitus    Lantus x 10 yrs   Gout    History of kidney stones 09-08-12   multiple times with some lithotripsies   Hyperlipemia    Hypertension     Past Surgical History:  Procedure Laterality Date   BACK SURGERY     microsurgery: spinal stenosis -lumbar   bone spur  1990's   right great toe; "probably related to gout"   BUBBLE STUDY  06/17/2020   Procedure: BUBBLE STUDY;  Surgeon: Lelon Perla, MD;  Location: [facility];  Service: Cardiovascular;;   CARDIAC CATHETERIZATION  04/27/11   CATARACT EXTRACTION W/ INTRAOCULAR LENS IMPLANT  1990's   right; "lens was replaced twice over 3 months; lens is actually over iris"   CORONARY ARTERY BYPASS GRAFT  04/30/2011   Procedure: CORONARY ARTERY BYPASS GRAFTING (CABG);  Surgeon: Melrose Nakayama, MD;  Location: [facility];  Service: Open Heart Surgery;   Laterality: N/A;,x4 vessels   CYSTOSCOPY W/ URETERAL STENT PLACEMENT Right 08/19/2012   Procedure: CYSTOSCOPY WITH RETROGRADE PYELOGRAM/URETERAL STENT PLACEMENT;  Surgeon: Molli Hazard, MD;  Location[facility];  Service: Urology;  Laterality: Right;   CYSTOSCOPY WITH RETROGRADE PYELOGRAM, URETEROSCOPY AND STENT PLACEMENT Left 07/17/2013   Procedure: CYSTOSCOPY, LEFT URETEROSCOPY, BASKET EXTRACTION OF STONE, INSERTION OF LEFT URETERAL STENT, ;  Surgeon: Jorja Loa, MD;  [facility];  Service: Urology;  Laterality: Left;   CYSTOSCOPY/RETROGRADE/URETEROSCOPY Right 09/12/2012   Procedure: CYSTOSCOPY/RETROGRADE/URETEROSCOPY;  Surgeon: Franchot Gallo, MD;  [facility];  Service: Urology;  Laterality: Right;   HOLMIUM LASER APPLICATION Right [id number]   Procedure: HOLMIUM LASER APPLICATION;  Surgeon: Franchot Gallo, MD;  [facility];  Service: Urology;  Laterality: Right;   HOLMIUM LASER APPLICATION Left [id number]   Procedure: HOLMIUM LASER APPLICATION;  Surgeon: Jorja Loa, MD;  [facility];  Service: Urology;  Laterality: Left;   KIDNEY STONE SURGERY     "probably 3 times"   LEFT HEART CATHETERIZATION WITH CORONARY ANGIOGRAM N/A 04/28/2011   Procedure: LEFT HEART CATHETERIZATION WITH CORONARY ANGIOGRAM;  Surgeon: Jacolyn Reedy, MD;  Location: [facility] CATH LAB;  Service: Cardiovascular;  Laterality: N/A;   LITHOTRIPSY     "many;  probably 5 times"   RADIAL ARTERY HARVEST  04/30/2011   Procedure: RADIAL ARTERY HARVEST;  Surgeon: Melrose Nakayama, MD;  Location: [facility];  Service: Open Heart Surgery;  Laterality: Left;   RETINAL DETACHMENT SURGERY  1990's   right eye; "made me have an early cataract"   TEE WITHOUT CARDIOVERSION N/A 06/17/2020   Procedure: TRANSESOPHAGEAL ECHOCARDIOGRAM (TEE);  Surgeon: Lelon Perla, MD;  Location: [facility]OSCOPY;  Service: Cardiovascular;  Laterality: N/A;   VASECTOMY      There were no vitals filed for this visit.    Subjective Assessment - 10/29/20 0659     Subjective  Patient reports he saw an eye doctor at [facility].  He was issued temporary glasses with temporary prisms.    Patient is accompanied by: Family member    Pertinent History 70 y/o male presented to  ED for concern of dizziness and diplopia on 07/21/20. MRI showed acute L pontine CVA and prior L parietal infract.  PMH: CAD s/p CABG, HTN, asthma, type 2 diabetes, CKD stage III, HLD, remote R retinal detachment, HTN, CVA.    Limitations per MD ultrasound is ok as long as it is not over the site of loop recorder, TENS as long as it is not proximal to the coracoid    Currently in Pain? No/denies    Pain Score 0-No pain             Patient seen for aquatic therapy visit.  Session occurred in 54f of water.  Started with prone stretch for shoulders, anterior trunk.  Followed with use of buoyancy resisted movements to address abd/add, flexion extension.  Worked on functional movements with arm behind body as needed for tucking in shirt, looping belt through loops.  Modified plank and push up and pool stairs.   Addressed gross motor coordination with underwater step.  Patient becoming more aware of need for activation throughout RLE and trunk to maintain balance in single limb conditions.                        OT Education - 10/29/20 0700     Education Details Discussed wearing prism lens for two conditions - reading,and navigating environment and then reporting back on impact on diplopia    Person(s) Educated Patient;Spouse    Methods Explanation    Comprehension Verbalized understanding              OT Short Term Goals - 10/29/20 0705       OT SHORT TERM GOAL #1   Title I with HEP for vision, coordination    Time 4    Period Weeks    Status Achieved    Target Date 09/17/20      OT SHORT TERM GOAL #2   Title Pt will demonstrate improved RUE fine motor coordination for ADLS as evidenced by decreasing 9 hole peg test  score to 52 secs or less    Time 4    Period Weeks    Status Achieved   09/26/20 - 50 sec 1st trial; 34 sec 2nd trial     OT SHORT TERM GOAL #3   Title Pt will report that he is cutting food independently and  feeding himself with RUE at least 75% of the time.    Time 4    Period Weeks    Status Achieved      OT SHORT TERM GOAL #4   Title Pt will perfrom all basic  ADLS with distant supervision.    Time 4    Period Weeks    Status Achieved      OT SHORT TERM GOAL #5   Title Pt will demonstrate ability to retrieve a lightweight object at 110* shoulder flexion with good control and min compensation.    Time 4    Period Weeks    Status Achieved   120     OT SHORT TERM GOAL #6   Title Pt will increase RUE grip strength to 45 lbs or greater for increased functional use.    Time 4    Period Weeks    Status Achieved   56.4 lbs     OT SHORT TERM GOAL #7   Title Pt will verbalzie understanding of compensatroy strategies for visual deficits    Time 4    Period Weeks    Status Achieved               OT Long Term Goals - 10/29/20 [id number]       OT LONG TERM GOAL #1   Title I with all basic ADLS.    Time 12    Period Weeks    Status On-going      OT LONG TERM GOAL #2   Title Pt will demonstrate improved fine motor coordination for ADLs as evidenced by performing 9 hole peg test in 45 secs or less.    Time 12    Period Weeks    Status On-going      OT LONG TERM GOAL #3   Title Pt will perform simple snack/ beverage prep modified Independently.    Time 12    Period Weeks    Status On-going      OT LONG TERM GOAL #4   Title Pt will retrieve a 3 lbs object with RUE at 115 shoulder flexion with good control.    Time 12    Period Weeks    Status On-going      OT LONG TERM GOAL #5   Title Pt will perfrom simulated work activities modified Independently    Time 12    Period Weeks    Status On-going      OT LONG TERM GOAL #6   Title Pt will perfrom tabletop and  environmental scanning with 90% or better accuracy in prep for work activities.    Time 12    Period Weeks    Status On-going                   Plan - 10/29/20 0702     Clinical Impression Statement Patient continues to have only occasional sholder discomfort which he attributes to poor positioning - eg lying on couch.  Patient does still have difficulty with both gross and fine motor coordination.  Can plan to adjust schedule to do pool 2 vs 4 times/month to allow more clinic time for FM control issues.    OT Occupational Profile and History Detailed Assessment- Review of Records and additional review of physical, cognitive, psychosocial history related to current functional performance    Occupational performance deficits (Please refer to evaluation for details): ADL's;IADL's;Play;Work;Leisure;Social Participation    Body Structure / Function / Physical Skills ADL;Balance;Endurance;Mobility;Strength;Flexibility;UE functional use;FMC;Coordination;Gait;Vision;ROM;GMC;Decreased knowledge of precautions;Decreased knowledge of use of DME;Dexterity;IADL    Rehab Potential Good    Clinical Decision Making Limited treatment options, no task modification necessary    Comorbidities Affecting Occupational Performance: May have comorbidities impacting occupational performance    Modification  or Assistance to Complete Evaluation  No modification of tasks or assist necessary to complete eval    OT Frequency 2x / week    OT Duration 12 weeks    OT Treatment/Interventions Self-care/ADL training;Therapeutic exercise;Functional Mobility Training;Balance training;Ultrasound;Neuromuscular education;Manual Therapy;Therapeutic activities;Paraffin;Cryotherapy;DME and/or AE instruction;Cognitive remediation/compensation;Visual/perceptual remediation/compensation;Fluidtherapy;Moist Heat;Electrical Stimulation;Passive range of motion;Patient/family education;Energy conservation;Aquatic Therapy;Gait Training     Plan aquatic therapy, NMR, address shoulder pain, regain range of motion and strength, and normal movement patterns, functional mobility and balance/ gross motor coordination    Consulted and Agree with Plan of Care Patient             Patient will benefit from skilled therapeutic intervention in order to improve the following deficits and impairments:   Body Structure / Function / Physical Skills: ADL, Balance, Endurance, Mobility, Strength, Flexibility, UE functional use, FMC, Coordination, Gait, Vision, ROM, GMC, Decreased knowledge of precautions, Decreased knowledge of use of DME, Dexterity, IADL       Visit Diagnosis: Acute right hemiparesis (HCC)  Other lack of coordination  Visuospatial deficit  Unsteadiness on feet  Muscle weakness (generalized)    Problem List Patient Active Problem List   Diagnosis Date Noted   Left pontine stroke ([place]) 07/25/2020   Acute right hemiparesis ([facility]) 07/25/2020   Essential hypertension    History of CVA (cerebrovascular accident)    CKD (chronic kidney disease), stage II    Diabetes mellitus type 2 in obese ([facility])    Dyslipidemia    CVA (cerebral vascular accident) ([facility]) 07/21/2020   Cryptogenic stroke ([facility]) 06/18/2020   Acute-on-chronic kidney injury ([facility]) 05/28/2020   Stroke ([place]) 05/27/2020   Elevated troponin 01/13/2019   COVID-19 virus infection 01/13/2019   S/P CABG (coronary artery bypass graft)    CAD (coronary artery disease) 04/28/2011   Insulin dependent type 2 diabetes mellitus, controlled ([facility])    Hyperlipidemia    Hypertension    Obesity (BMI 30-39.9)    Nephrolithiasis, uric acid     Mariah Milling 10/29/2020, 7:07 AM  [facility] [address] [place], [place], [address] Phone: [phone]   Fax:  [phone]  Name: Mitchell Lambert MRN: [id number] Date of Birth: 12-Dec-1950

## 2020-10-30 ENCOUNTER — Ambulatory Visit: Payer: Medicare Other | Admitting: Physical Therapy

## 2020-10-30 ENCOUNTER — Encounter: Payer: Self-pay | Admitting: Occupational Therapy

## 2020-10-30 ENCOUNTER — Institutional Professional Consult (permissible substitution): Payer: Medicare Other | Admitting: Neurology

## 2020-10-30 ENCOUNTER — Other Ambulatory Visit: Payer: Self-pay

## 2020-10-30 ENCOUNTER — Encounter: Payer: Self-pay | Admitting: Physical Therapy

## 2020-10-30 ENCOUNTER — Ambulatory Visit: Payer: Medicare Other | Admitting: Occupational Therapy

## 2020-10-30 DIAGNOSIS — R2681 Unsteadiness on feet: Secondary | ICD-10-CM

## 2020-10-30 DIAGNOSIS — M6281 Muscle weakness (generalized): Secondary | ICD-10-CM

## 2020-10-30 DIAGNOSIS — R41842 Visuospatial deficit: Secondary | ICD-10-CM

## 2020-10-30 DIAGNOSIS — R2689 Other abnormalities of gait and mobility: Secondary | ICD-10-CM

## 2020-10-30 DIAGNOSIS — I639 Cerebral infarction, unspecified: Secondary | ICD-10-CM | POA: Diagnosis not present

## 2020-10-30 DIAGNOSIS — R278 Other lack of coordination: Secondary | ICD-10-CM

## 2020-10-30 DIAGNOSIS — M7541 Impingement syndrome of right shoulder: Secondary | ICD-10-CM | POA: Diagnosis not present

## 2020-10-30 DIAGNOSIS — G8191 Hemiplegia, unspecified affecting right dominant side: Secondary | ICD-10-CM

## 2020-10-30 NOTE — Therapy (Addendum)
[facility] [address] [place], [place], [address] Phone: [phone]   Fax:  [phone]  Physical Therapy Treatment  Patient Details  Name: Mitchell Lambert MRN: [id number] Date of Birth: 05-29-1950 Referring Provider (PT): Garvin Fila, MD   Encounter Date: 10/30/2020   PT End of Session - 10/30/20 1529     Visit Number 13    Number of Visits 27    Date for PT Re-Evaluation [id number]   per re-cert on 0000000   Authorization Type Medicare - will need 10th visit PN    Progress Note Due on Visit 20    PT Start Time 1445    PT Stop Time 1527    PT Time Calculation (min) 42 min    Equipment Utilized During Treatment Gait belt    Activity Tolerance Patient tolerated treatment well    Behavior During Therapy WFL for tasks assessed/performed             Past Medical History:  Diagnosis Date   Angina    NO ANGINA SINCE CABG   Arthritis    right ankle   Chronic daily headache    "@ least every other day""improved since heart surgery"   Coronary artery disease    PT [place] CABG SURGERY - DR. TILLEY IS HIS CARDIOLOGIST   Diabetes mellitus    Lantus x 10 yrs   Gout    History of kidney stones 09-08-12   multiple times with some lithotripsies   Hyperlipemia    Hypertension     Past Surgical History:  Procedure Laterality Date   BACK SURGERY     microsurgery: spinal stenosis -lumbar   bone spur  1990's   right great toe; "probably related to gout"   BUBBLE STUDY  06/17/2020   Procedure: BUBBLE STUDY;  Surgeon: Lelon Perla, MD;  Location: [facility];  Service: Cardiovascular;;   CARDIAC CATHETERIZATION  04/27/11   CATARACT EXTRACTION W/ INTRAOCULAR LENS IMPLANT  1990's   right; "lens was replaced twice over 3 months; lens is actually over iris"   CORONARY ARTERY BYPASS GRAFT  04/30/2011   Procedure: CORONARY ARTERY BYPASS GRAFTING (CABG);  Surgeon: Melrose Nakayama, MD;   Location: [facility];  Service: Open Heart Surgery;  Laterality: N/A;,x4 vessels   CYSTOSCOPY W/ URETERAL STENT PLACEMENT Right 08/19/2012   Procedure: CYSTOSCOPY WITH RETROGRADE PYELOGRAM/URETERAL STENT PLACEMENT;  Surgeon: Molli Hazard, MD;  [facility];  Service: Urology;  Laterality: Right;   CYSTOSCOPY WITH RETROGRADE PYELOGRAM, URETEROSCOPY AND STENT PLACEMENT Left 07/17/2013   Procedure: CYSTOSCOPY, LEFT URETEROSCOPY, BASKET EXTRACTION OF STONE, INSERTION OF LEFT URETERAL STENT, ;  Surgeon: Jorja Loa, MD;  [facility];  Service: Urology;  Laterality: Left;   CYSTOSCOPY/RETROGRADE/URETEROSCOPY Right 09/12/2012   Procedure: CYSTOSCOPY/RETROGRADE/URETEROSCOPY;  Surgeon: Franchot Gallo, MD;  [facility];  Service: Urology;  Laterality: Right;   HOLMIUM LASER APPLICATION Right [id number]   Procedure: HOLMIUM LASER APPLICATION;  Surgeon: Franchot Gallo, MD;  [facility];  Service: Urology;  Laterality: Right;   HOLMIUM LASER APPLICATION Left [id number]   Procedure: HOLMIUM LASER APPLICATION;  Surgeon: Jorja Loa, MD;  [facility];  Service: Urology;  Laterality: Left;   KIDNEY STONE SURGERY     "probably 3 times"   LEFT HEART CATHETERIZATION WITH CORONARY ANGIOGRAM N/A 04/28/2011   Procedure: LEFT HEART CATHETERIZATION WITH CORONARY ANGIOGRAM;  Surgeon: Jacolyn Reedy, MD;  Location: [facility] C[facility] LAB;  Service: Cardiovascular;  Laterality: N/A;   LITHOTRIPSY     "many; probably 5 times"   RADIAL ARTERY HARVEST  04/30/2011   Procedure: RADIAL ARTERY HARVEST;  Surgeon: Melrose Nakayama, MD;  Location: [facility];  Service: Open Heart Surgery;  Laterality: Left;   RETINAL DETACHMENT SURGERY  1990's   right eye; "made me have an early cataract"   TEE WITHOUT CARDIOVERSION N/A 06/17/2020   Procedure: TRANSESOPHAGEAL ECHOCARDIOGRAM (TEE);  Surgeon: Lelon Perla, MD;  Location: [facility];  Service: Cardiovascular;  Laterality: N/A;   VASECTOMY       There were no vitals filed for this visit.   Subjective Assessment - 10/30/20 1448     Subjective Had a good birthday yesterday. Has been walking some outside with his cane, doing well. New exercises are going good.    Patient is accompained by: Family member    Pertinent History achilles tendon tear on L    Limitations Standing;Sitting;House hold activities    How long can you stand comfortably? No issues    How long can you walk comfortably? In house only    Patient Stated Goals Return to their own home, play drums again    Currently in Pain? No/denies    Pain Onset More than a month ago                               [facility] Adult PT Treatment/Exercise - 10/30/20 1500       Ambulation/Gait   Ambulation/Gait Yes    Ambulation/Gait Assistance 5: Supervision    Ambulation/Gait Assistance Details gait outdoors over paved surfaces performing conversational tasks with therapist with pt requiring supervision and no LOB or episodes of R foot catching, additional gait indoors with obstacle negotiation over 2" beams with pt stepping over first with LLE, no instances of RLE catching on obstacle. ambulated over blue compliant surface and then performed gait with head motions and speeding up gait speed, needing intermittent min guard with head motions    Ambulation Distance (Feet) 500 Feet   x1, 230 x 1   Assistive device Straight cane;Other (Comment)   with 4 prong tip   Gait Pattern Step-through pattern;Decreased stance time - right;Decreased dorsiflexion - right;Decreased weight shift to right;Right foot flat    Ambulation Surface Level;Unlevel;Indoor;Outdoor;Paved    Curb 5: Supervision    Curb Details (indicate cue type and reason) x4 reps outdoors with [facility] with quad tip, needs initial cues for proper cane placement, needing supervision for all reps today                 Balance Exercises - 10/30/20 1513       Balance Exercises: Standing   Stepping  Strategy Anterior;Posterior;Foam/compliant surface;10 reps;Limitations    Stepping Strategy Limitations on blue foam beam with RLE as stance leg, stepping LLE in forwards x10 reps then posterior x10 reps direction for incr SLS time on RLE, no UE support    Rockerboard Anterior/posterior;Lateral;Head turns;EO;Intermittent UE support;Limitations    Rockerboard Limitations in A/P direction: weight shifting x10 reps with no UE support, x10 reps head turns, x10 reps head nods, eyes closed 3  x30 seconds with intermittent UE support for balance    Step Ups Forward;6 inch    Step Ups Limitations x10 reps leading with RLE with no UE support, x10 reps with floating non stance LLE and using single UE support  PT Short Term Goals - 10/11/20 1347       PT SHORT TERM GOAL #1   Title Pt will perform 8 steps with SPC and single handrail with supervision in order to demo improved community access. ALL STGS DUE 11/08/20    Time 4    Period Weeks    Status New    Target Date 11/08/20      PT SHORT TERM GOAL #2   Title Pt will perform a curb with SPC with quad tip and supervision in order to demo improved community mobility.    Baseline requires min guard    Time 4    Period Weeks    Status New      PT SHORT TERM GOAL #3   Title Pt will decr TUG time with SPC to at least 12 seconds or less to demo decr fall risk.    Baseline 13.59 seconds    Time 4    Period Weeks    Status New      PT SHORT TERM GOAL #4   Title Pt will ambulate at least 250' outdoors with [facility] with quad tip and supervision in order to demo improved community mobility.    Baseline needs min guard, limited distances outdoors due to light sensitivity    Time 4    Period Weeks    Status New               PT Long Term Goals - 10/11/20 1350       PT LONG TERM GOAL #1   Title Pt will be independent with advanced HEP for strengthening and balance ALL LTGS DUE 12/06/20    Baseline did not have time to  assess, pt will benefit from ongoing additions/updates    Time 8    Period Weeks    Status On-going    Target Date 12/06/20      PT LONG TERM GOAL #2   Title Pt will improve gait speed with cane to at least 2.75 ft/sec in order to demo improved community mobility.    Baseline 2.34 ft/sec    Time 8    Period Weeks    Status New      PT LONG TERM GOAL #3   Title Pt will have improved DGI score to at least 19/24 to demo decreased fall risk    Baseline 17/24; 15/24 ON 10/08/20    Time 8    Period Weeks    Status On-going      PT LONG TERM GOAL #4   Title Pt will be able to amb at least 500' with LRAD mod I on level and unlevel surfaces for community amb    Baseline need to use RW for longer outdoor distances, did not have time to do distance outdoors today/too hot    Time 8    Period Weeks    Status Revised      PT LONG TERM GOAL #5   Title Pt will be able to use R foot/ankle for drumming to return to leisure activities    Baseline has been trying to use his drums at home    Time 8    Period Weeks    Status On-going                   Plan - 10/30/20 1623     Clinical Impression Statement Continued to work on gait outdoors with [facility] with quad tip. Pt tolerated well, needing supervision for  gait and curb negotiation. Remainder of session focused on dynamic gait activities with cane including obstacles and balance on compliant surfaces. Did better clearing RLE over obstacles today. Will continue to progress towards LTGs.    Personal Factors and Comorbidities Age;Fitness;Time since onset of injury/illness/exacerbation;Comorbidity 1;Past/Current Experience    Comorbidities L Achilles tendon tear, chronic R LE edema, CAD s/p CABG, HTN, asthma, type 2 diabetes, CKD stage III, HLD, remote R retinal detachment, HTN, CVA.    Examination-Activity Limitations Squat;Stairs;Caring for Others;Locomotion Level    Examination-Participation Restrictions Cleaning;Community  Activity;Driving;Occupation;Shop;Yard Work    [employer] Evolving/Moderate complexity    Rehab Potential Good    PT Frequency 2x / week    PT Duration 8 weeks    PT Treatment/Interventions ADLs/Self Care Home Management;Aquatic Therapy;Cryotherapy;Electrical Stimulation;Moist Heat;DME Instruction;Gait training;Stair training;Functional mobility training;Therapeutic activities;Therapeutic exercise;Balance training;Neuromuscular re-education;Manual techniques;Patient/family education;Passive range of motion;Taping;Joint Manipulations;Vestibular    PT Next Visit Plan try gait with no AD in gym? Continue gait training with SPC with quad tip - dynamic tasks. continue stair training. balance with head motions. Continue to progress R LE strengthening -hamstring, (and working on active DF/PF). RLE SLS tasks.    PT Home Exercise Plan Access Code: [email or web address]; Walking program (09/17/20)- pt instructions    Consulted and Agree with Plan of Care Patient;Family member/caregiver             Patient will benefit from skilled therapeutic intervention in order to improve the following deficits and impairments:  Abnormal gait, Decreased range of motion, Difficulty walking, Increased fascial restricitons, Impaired tone, Decreased activity tolerance, Decreased balance, Decreased mobility, Decreased strength, Increased edema  Visit Diagnosis: Unsteadiness on feet  Muscle weakness (generalized)  Other abnormalities of gait and mobility     Problem List Patient Active Problem List   Diagnosis Date Noted   Left pontine stroke ([place]) 07/25/2020   Acute right hemiparesis ([place]) 07/25/2020   Essential hypertension    History of CVA (cerebrovascular accident)    CKD (chronic kidney disease), stage II    Diabetes mellitus type 2 in obese (HCC)    Dyslipidemia    CVA (cerebral vascular accident) ([place]) 07/21/2020   Cryptogenic stroke ([place]) 06/18/2020   Acute-on-chronic kidney injury ([place])  05/28/2020   Stroke ([place]) 05/27/2020   Elevated troponin 01/13/2019   COVID-19 virus infection 01/13/2019   S/P CABG (coronary artery bypass graft)    CAD (coronary artery disease) 04/28/2011   Insulin dependent type 2 diabetes mellitus, controlled ([place])    Hyperlipidemia    Hypertension    Obesity (BMI 30-39.9)    Nephrolithiasis, uric acid     Arliss Journey, PT, DPT  10/30/2020, 8:44 PM  [facility] [address] [place], [place], [address] Phone: [phone]   Fax:  [phone]  Name: Mitchell Lambert MRN: [id number] Date of Birth: [birth year]

## 2020-10-30 NOTE — Therapy (Signed)
[facility] [address] [place], [place], [address] Phone: [phone]   Fax:  [phone]  Occupational Therapy Treatment  Patient Details  Name: Mitchell Lambert MRN: [id number] Date of Birth: October 18, 1950 Referring Provider (OT): Dr. Leonie Man   Encounter Date: 10/30/2020   OT End of Session - 10/30/20 1624     Visit Number 16    Number of Visits 24    Date for OT Re-Evaluation 11/12/20    Authorization Type Medicare    Progress Note Due on Visit 1    OT Start Time 1530    OT Stop Time 1615    OT Time Calculation (min) 45 min    Activity Tolerance Patient tolerated treatment well    Behavior During Therapy WFL for tasks assessed/performed             Past Medical History:  Diagnosis Date   Angina    NO ANGINA SINCE CABG   Arthritis    right ankle   Chronic daily headache    "@ least every other day""improved since heart surgery"   Coronary artery disease    PT [place] CABG SURGERY - DR. TILLEY IS HIS CARDIOLOGIST   Diabetes mellitus    Lantus x 10 yrs   Gout    History of kidney stones 09-08-12   multiple times with some lithotripsies   Hyperlipemia    Hypertension     Past Surgical History:  Procedure Laterality Date   BACK SURGERY     microsurgery: spinal stenosis -lumbar   bone spur  1990's   right great toe; "probably related to gout"   BUBBLE STUDY  06/17/2020   Procedure: BUBBLE STUDY;  Surgeon: Lelon Perla, MD;  Location: [facility];  Service: Cardiovascular;;   CARDIAC CATHETERIZATION  04/27/11   CATARACT EXTRACTION W/ INTRAOCULAR LENS IMPLANT  1990's   right; "lens was replaced twice over 3 months; lens is actually over iris"   CORONARY ARTERY BYPASS GRAFT  04/30/2011   Procedure: CORONARY ARTERY BYPASS GRAFTING (CABG);  Surgeon: Melrose Nakayama, MD;  Location: [facility];  Service: Open Heart Surgery;  Laterality: N/A;,x4 vessels   CYSTOSCOPY W/ URETERAL  STENT PLACEMENT Right 08/19/2012   Procedure: CYSTOSCOPY WITH RETROGRADE PYELOGRAM/URETERAL STENT PLACEMENT;  Surgeon: Molli Hazard, MD;  Location: [facility];  Service: Urology;  Laterality: Right;   CYSTOSCOPY WITH RETROGRADE PYELOGRAM, URETEROSCOPY AND STENT PLACEMENT Left 07/17/2013   Procedure: CYSTOSCOPY, LEFT URETEROSCOPY, BASKET EXTRACTION OF STONE, INSERTION OF LEFT URETERAL STENT, ;  Surgeon: Jorja Loa, MD;  Location: [facility];  Service: Urology;  Laterality: Left;   CYSTOSCOPY/RETROGRADE/URETEROSCOPY Right 09/12/2012   Procedure: CYSTOSCOPY/RETROGRADE/URETEROSCOPY;  Surgeon: Franchot Gallo, MD;  Location: [facility];  Service: Urology;  Laterality: Right;   HOLMIUM LASER APPLICATION Right [id number]   Procedure: HOLMIUM LASER APPLICATION;  Surgeon: Franchot Gallo, MD;  Location: [facility];  Service: Urology;  Laterality: Right;   HOLMIUM LASER APPLICATION Left [id number]   Procedure: HOLMIUM LASER APPLICATION;  Surgeon: Jorja Loa, MD;  Location: [facility];  Service: Urology;  Laterality: Left;   KIDNEY STONE SURGERY     "probably 3 times"   LEFT HEART CATHETERIZATION WITH CORONARY ANGIOGRAM N/A 04/28/2011   Procedure: LEFT HEART CATHETERIZATION WITH CORONARY ANGIOGRAM;  Surgeon: Jacolyn Reedy, MD;  Location: [facility];  Service: Cardiovascular;  Laterality: N/A;   LITHOTRIPSY     "many; probably 5 times"   [place]  04/30/2011   Procedure: RADIAL ARTERY HARVEST;  Surgeon: Melrose Nakayama, MD;  Location: [facility];  Service: Open Heart Surgery;  Laterality: Left;   RETINAL DETACHMENT SURGERY  1990's   right eye; "made me have an early cataract"   TEE WITHOUT CARDIOVERSION N/A 06/17/2020   Procedure: TRANSESOPHAGEAL ECHOCARDIOGRAM (TEE);  Surgeon: Lelon Perla, MD;  Location: [facility];  Service: Cardiovascular;  Laterality: N/A;   VASECTOMY      There were no vitals filed for this visit.   Subjective Assessment - 10/30/20 1615      Subjective  Patient has been wearing the temporary prisms for brief periods each day.  He feels they definitely help his eyes "sync up" more quickly.  He reports that he wasplanning on having lens replacement in left eye prior to stroke - Eye doctor [facility] indocated that may help his current visual situation.    Pertinent History 70 y/o male presented to  ED for concern of dizziness and diplopia on 07/21/20. MRI showed acute L pontine CVA and prior L parietal infract.  PMH: CAD s/p CABG, HTN, asthma, type 2 diabetes, CKD stage III, HLD, remote R retinal detachment, HTN, CVA.    Limitations per MD ultrasound is ok as long as it is not over the site of loop recorder, TENS as long as it is not proximal to the coracoid    Patient Stated Goals regain use of RUE    Currently in Pain? No/denies    Pain Score 0-No pain                          OT Treatments/Exercises (OP) - 10/30/20 0001       ADLs   ADL Comments Reviewed remaining OT goals.  Patient has made excellent progress with functional use of RUE, and with visual compensation.  Patient still challenged by balance.  Anticipate discharging at end of this plan of care versus extending OT further.      Visual/Perceptual Exercises   Other Exercises Tabletop and environmental scanning - patient functioning now at 90-100% with compensatory strategies.      Neurological Re-education Exercises   Other Exercises 1 Able to place and retrieve 3 lb weight on chest height and overhead shelf without difficulty.  Patient identifies and self corrects maladaptive movement patterns.                    OT Education - 10/30/20 1623     Education Details Progress toward goals and plan to discharge in next two weeks    Person(s) Educated Patient    Methods Explanation    Comprehension Verbalized understanding              OT Short Term Goals - 10/30/20 1538       OT SHORT TERM GOAL #1   Title I with HEP for vision,  coordination    Time 4    Period Weeks    Status Achieved    Target Date 09/17/20      OT SHORT TERM GOAL #2   Title Pt will demonstrate improved RUE fine motor coordination for ADLS as evidenced by decreasing 9 hole peg test score to 52 secs or less    Time 4    Period Weeks    Status Achieved   09/26/20 - 50 sec 1st trial; 34 sec 2nd trial     OT SHORT TERM GOAL #3   Title  Pt will report that he is cutting food independently and  feeding himself with RUE at least 75% of the time.    Time 4    Period Weeks    Status Achieved      OT SHORT TERM GOAL #4   Title Pt will perfrom all basic ADLS with distant supervision.    Time 4    Period Weeks    Status Achieved      OT SHORT TERM GOAL #5   Title Pt will demonstrate ability to retrieve a lightweight object at 110* shoulder flexion with good control and min compensation.    Time 4    Period Weeks    Status Achieved   120     OT SHORT TERM GOAL #6   Title Pt will increase RUE grip strength to 45 lbs or greater for increased functional use.    Time 4    Period Weeks    Status Achieved   56.4 lbs     OT SHORT TERM GOAL #7   Title Pt will verbalzie understanding of compensatroy strategies for visual deficits    Time 4    Period Weeks    Status Achieved               OT Long Term Goals - 10/30/20 1539       OT LONG TERM GOAL #1   Title I with all basic ADLS.    Time 12    Period Weeks    Status Achieved      OT LONG TERM GOAL #2   Title Pt will demonstrate improved fine motor coordination for ADLs as evidenced by performing 9 hole peg test in 45 secs or less.    Time 12    Period Weeks    Status Achieved   33 sec     OT LONG TERM GOAL #3   Title Pt will perform simple snack/ beverage prep modified Independently.    Time 12    Period Weeks    Status Achieved      OT LONG TERM GOAL #4   Title Pt will retrieve a 3 lbs object with RUE at 115 shoulder flexion with good control.    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    Time 12    Period Weeks    Status On-going                   Plan - 10/30/20 1624     Clinical Impression Statement Patient is rapidly approaching goal level for OT.    OT Occupational Profile and History Detailed Assessment- Review of Records and additional review of physical, cognitive, psychosocial history related to current functional performance    Occupational performance deficits (Please refer to evaluation for details): ADL's;IADL's;Play;Work;Leisure;Social Participation    Body Structure / Function / Physical Skills ADL;Balance;Endurance;Mobility;Strength;Flexibility;UE functional use;FMC;Coordination;Gait;Vision;ROM;GMC;Decreased knowledge of precautions;Decreased knowledge of use of DME;Dexterity;IADL    Rehab Potential Good  Clinical Decision Making Limited treatment options, no task modification necessary    Comorbidities Affecting Occupational Performance: May have comorbidities impacting occupational performance    Modification or Assistance to Complete Evaluation  No modification of tasks or assist necessary to complete eval    OT Frequency 2x / week    OT Duration 12 weeks    OT Treatment/Interventions Self-care/ADL training;Therapeutic exercise;Functional Mobility Training;Balance training;Ultrasound;Neuromuscular education;Manual Therapy;Therapeutic activities;Paraffin;Cryotherapy;DME and/or AE instruction;Cognitive remediation/compensation;Visual/perceptual remediation/compensation;Fluidtherapy;Moist Heat;Electrical Stimulation;Passive range of  motion;Patient/family education;Energy conservation;Aquatic Therapy;Gait Training    Plan aquatic therapy, NMR, address shoulder pain, regain range of motion and strength, and normal movement patterns, functional mobility and balance/ gross motor coordination    Consulted and Agree with Plan of Care Patient             Patient will benefit from skilled therapeutic intervention in order to improve the following deficits and impairments:   Body Structure / Function / Physical Skills: ADL, Balance, Endurance, Mobility, Strength, Flexibility, UE functional use, FMC, Coordination, Gait, Vision, ROM, GMC, Decreased knowledge of precautions, Decreased knowledge of use of DME, Dexterity, IADL       Visit Diagnosis: Acute right hemiparesis (HCC)  Other lack of coordination  Visuospatial deficit  Muscle weakness (generalized)  Unsteadiness on feet    Problem List Patient Active Problem List   Diagnosis Date Noted   Left pontine stroke ([facility]) 07/25/2020   Acute right hemiparesis ([place]) 07/25/2020   Essential hypertension    History of CVA (cerebrovascular accident)    CKD (chronic kidney disease), stage II    Diabetes mellitus type 2 in obese ([place])    Dyslipidemia    CVA (cerebral vascular accident) ([facility]) 07/21/2020   Cryptogenic stroke ([place]) 06/18/2020   Acute-on-chronic kidney injury ([facility]) 05/28/2020   Stroke ([place]) 05/27/2020   Elevated troponin 01/13/2019   COVID-19 virus infection 01/13/2019   S/P CABG (coronary artery bypass graft)    CAD (coronary artery disease) 04/28/2011   Insulin dependent type 2 diabetes mellitus, controlled ([place])    Hyperlipidemia    Hypertension    Obesity (BMI 30-39.9)    Nephrolithiasis, uric acid     Mariah Milling 10/30/2020, 4:26 PM  [facility] [address] [place], [place], [address] Phone: [phone]   Fax:  [phone]  Name: Mitchell Lambert MRN: [id number] Date  of Birth: 1950-08-09

## 2020-11-01 ENCOUNTER — Other Ambulatory Visit: Payer: Self-pay

## 2020-11-01 ENCOUNTER — Encounter: Payer: Self-pay | Admitting: Physical Therapy

## 2020-11-01 ENCOUNTER — Ambulatory Visit: Payer: Medicare Other | Attending: Family Medicine | Admitting: Physical Therapy

## 2020-11-01 DIAGNOSIS — R2689 Other abnormalities of gait and mobility: Secondary | ICD-10-CM | POA: Diagnosis not present

## 2020-11-01 DIAGNOSIS — G8191 Hemiplegia, unspecified affecting right dominant side: Secondary | ICD-10-CM | POA: Insufficient documentation

## 2020-11-01 DIAGNOSIS — M6281 Muscle weakness (generalized): Secondary | ICD-10-CM | POA: Insufficient documentation

## 2020-11-01 DIAGNOSIS — R52 Pain, unspecified: Secondary | ICD-10-CM | POA: Diagnosis not present

## 2020-11-01 DIAGNOSIS — R2681 Unsteadiness on feet: Secondary | ICD-10-CM | POA: Insufficient documentation

## 2020-11-01 DIAGNOSIS — R41842 Visuospatial deficit: Secondary | ICD-10-CM | POA: Insufficient documentation

## 2020-11-01 DIAGNOSIS — R278 Other lack of coordination: Secondary | ICD-10-CM

## 2020-11-01 NOTE — Therapy (Signed)
[facility] [address] [place], [place], [address] Phone: [phone]   Fax:  [phone]  Physical Therapy Treatment  Patient Details  Name: Mitchell Lambert MRN: [id number] Date of Birth: Jul 21, 1950 Referring Provider (PT): Garvin Fila, MD   Encounter Date: 11/01/2020   PT End of Session - 11/01/20 1332     Visit Number 14    Number of Visits 27    Date for PT Re-Evaluation [id number]   per re-cert on 0000000   Authorization Type Medicare - will need 10th visit PN    Progress Note Due on Visit 20    PT Start Time 1232    PT Stop Time 1312    PT Time Calculation (min) 40 min    Equipment Utilized During Treatment Gait belt    Activity Tolerance Patient tolerated treatment well    Behavior During Therapy WFL for tasks assessed/performed             Past Medical History:  Diagnosis Date   Angina    NO ANGINA SINCE CABG   Arthritis    right ankle   Chronic daily headache    "@ least every other day""improved since heart surgery"   Coronary artery disease    PT [place] CABG SURGERY - DR. TILLEY IS HIS CARDIOLOGIST   Diabetes mellitus    Lantus x 10 yrs   Gout    History of kidney stones 09-08-12   multiple times with some lithotripsies   Hyperlipemia    Hypertension     Past Surgical History:  Procedure Laterality Date   BACK SURGERY     microsurgery: spinal stenosis -lumbar   bone spur  1990's   right great toe; "probably related to gout"   BUBBLE STUDY  06/17/2020   Procedure: BUBBLE STUDY;  Surgeon: Lelon Perla, MD;  Location: [facility];  Service: Cardiovascular;;   CARDIAC CATHETERIZATION  04/27/11   CATARACT EXTRACTION W/ INTRAOCULAR LENS IMPLANT  1990's   right; "lens was replaced twice over 3 months; lens is actually over iris"   CORONARY ARTERY BYPASS GRAFT  04/30/2011   Procedure: CORONARY ARTERY BYPASS GRAFTING (CABG);  Surgeon: Mel[place] Nakayama, MD;   Location: [facility];  Service: Open Heart Surgery;  Laterality: N/A;,x4 vessels   CYSTOSCOPY W/ URETERAL STENT PLACEMENT Right 08/19/2012   Procedure: CYSTOSCOPY WITH RETROGRADE PYELOGRAM/URETERAL STENT PLACEMENT;  Surgeon: Molli Hazard, MD;  Location: [facility];  Service: Urology;  Laterality: Right;   CYSTOSCOPY WITH RETROGRADE PYELOGRAM, URETEROSCOPY AND STENT PLACEMENT Left 07/17/2013   Procedure: CYSTOSCOPY, LEFT URETEROSCOPY, BASKET EXTRACTION OF STONE, INSERTION OF LEFT URETERAL STENT, ;  Surgeon: Jorja Loa, MD;  Location: [facility];  Service: Urology;  Laterality: Left;   CYSTOSCOPY/RETROGRADE/URETEROSCOPY Right 09/12/2012   Procedure: CYSTOSCOPY/RETROGRADE/URETEROSCOPY;  Surgeon: Franchot Gallo, MD;  Location: [facility];  Service: Urology;  Laterality: Right;   HOLMIUM LASER APPLICATION Right [id number]   Procedure: HOLMIUM LASER APPLICATION;  Surgeon: Franchot Gallo, MD;  Location: [facility];  Service: Urology;  Laterality: Right;   HOLMIUM LASER APPLICATION Left [id number]   Procedure: HOLMIUM LASER APPLICATION;  Surgeon: Jorja Loa, MD;  Location: [facility];  Service: Urology;  Laterality: Left;   KIDNEY STONE SURGERY     "probably 3 times"   LEFT HEART CATHETERIZATION WITH CORONARY ANGIOGRAM N/A 04/28/2011   Procedure: LEFT HEART CATHETERIZATION WITH CORONARY ANGIOGRAM;  Surgeon: Jacolyn Reedy, MD;  Location: [facility];  Service: Cardiovascular;  Laterality: N/A;   LITHOTRIPSY     "many; probably 5 times"   RADIAL ARTERY HARVEST  04/30/2011   Procedure: RADIAL ARTERY HARVEST;  Surgeon: [place] Nakayama, MD;  Location: [facility];  Service: Open Heart Surgery;  Laterality: Left;   RETINAL DETACHMENT SURGERY  1990's   right eye; "made me have an early cataract"   TEE WITHOUT CARDIOVERSION N/A 06/17/2020   Procedure: TRANSESOPHAGEAL ECHOCARDIOGRAM (TEE);  Surgeon: Lelon Perla, MD;  Location: [facility]OSCOPY;  Service: Cardiovascular;  Laterality: N/A;   VASECTOMY       There were no vitals filed for this visit.   Subjective Assessment - 11/01/20 1234     Subjective Nothing new since last here, asking about trying to walk without the brace    Patient is accompained by: Family member    Pertinent History achilles tendon tear on L    Limitations Standing;Sitting;House hold activities    How long can you stand comfortably? No issues    How long can you walk comfortably? In house only    Patient Stated Goals Return to their own home, play drums again    Currently in Pain? No/denies    Pain Onset More than a month ago                               [facility] Adult PT Treatment/Exercise - 11/01/20 1235       Ambulation/Gait   Ambulation/Gait Yes    Ambulation/Gait Assistance 5: Supervision    Ambulation/Gait Assistance Details pt asking about ambulating without AFO - trialed with RW without brace with pt demonstrating R foot flat contact and decr eccentric control of ankle DF. trialed R foot up brace for an additional 2 laps with RW, pt with improved heel strike at times, but still with decr eccentric control and foot flat contact, noticed intermittent episodes of R genu recurvatum. attempted small distance gait with foot up brace and cane with pt demonstrating 2 episodes of R toe catching when pt was distracted, needing min guard. Discussed with pt that use of AFO is safest means at this for gait (esp with cane) due to weakness and to help improve gait mechanics, pt in agreement    Ambulation Distance (Feet) 230 Feet   x1, 230 x 1   Assistive device Rolling walker    Gait Pattern Step-through pattern;Decreased stance time - right;Decreased dorsiflexion - right;Decreased weight shift to right;Right foot flat    Ambulation Surface Level;Indoor                 Balance Exercises - 11/01/20 1253       Balance Exercises: Standing   Standing Eyes Opened Foam/compliant surface;Narrow base of support (BOS)    Standing Eyes Opened  Limitations on blue foam beam, slight space between feet 2 x 10 reps, 2 x 10 reps head turns, intermittent tap to bars for balance    SLS with Vectors Intermittent upper extremity assist;Limitations;Solid surface    SLS with Vectors Limitations standing on RLE forward taps to cone x10 reps, then forward/cross body tap to cone x10 reps - needing intermittent UE support, cues for soft knee on stance RLE    Sidestepping Foam/compliant support;4 reps;Limitations    Sidestepping Limitations on blue foam beam, cues for foot clearance and for slowed SLS time down and back x4 reps with intermittent UE support    Step Over Hurdles /  Cones stepping over 4 smaller orange obstacles: down and back x2 reps with leading with LLE and step to pattern and therapist assisting with RLE knee flexion, then performed leading with RLE with cues for hip flexion vs. compensatory movement down and back x2 reps, pt needing UE support    Other Standing Exercises performed without AFO: on blue air ex: 2x10 reps heel toe raises. with BUE support: 2x10 reps mini squat and heel raise    Other Standing Exercises Comments --               PT Education - 11/01/20 1332     Education Details continue to use AFO for ambulation    Person(s) Educated Patient    Methods Explanation    Comprehension Verbalized understanding              PT Short Term Goals - 10/11/20 1347       PT SHORT TERM GOAL #1   Title Pt will perform 8 steps with SPC and single handrail with supervision in order to demo improved community access. ALL STGS DUE 11/08/20    Time 4    Period Weeks    Status New    Target Date 11/08/20      PT SHORT TERM GOAL #2   Title Pt will perform a curb with SPC with quad tip and supervision in order to demo improved community mobility.    Baseline requires min guard    Time 4    Period Weeks    Status New      PT SHORT TERM GOAL #3   Title Pt will decr TUG time with SPC to at least 12 seconds or less to  demo decr fall risk.    Baseline 13.59 seconds    Time 4    Period Weeks    Status New      PT SHORT TERM GOAL #4   Title Pt will ambulate at least 250' outdoors with [facility] with quad tip and supervision in order to demo improved community mobility.    Baseline needs min guard, limited distances outdoors due to light sensitivity    Time 4    Period Weeks    Status New               PT Long Term Goals - 10/11/20 1350       PT LONG TERM GOAL #1   Title Pt will be independent with advanced HEP for strengthening and balance ALL LTGS DUE 12/06/20    Baseline did not have time to assess, pt will benefit from ongoing additions/updates    Time 8    Period Weeks    Status On-going    Target Date 12/06/20      PT LONG TERM GOAL #2   Title Pt will improve gait speed with cane to at least 2.75 ft/sec in order to demo improved community mobility.    Baseline 2.34 ft/sec    Time 8    Period Weeks    Status New      PT LONG TERM GOAL #3   Title Pt will have improved DGI score to at least 19/24 to demo decreased fall risk    Baseline 17/24; 15/24 ON 10/08/20    Time 8    Period Weeks    Status On-going      PT LONG TERM GOAL #4   Title Pt will be able to amb at least 500' with LRAD mod I on  level and unlevel surfaces for community amb    Baseline need to use RW for longer outdoor distances, did not have time to do distance outdoors today/too hot    Time 8    Period Weeks    Status Revised      PT LONG TERM GOAL #5   Title Pt will be able to use R foot/ankle for drumming to return to leisure activities    Baseline has been trying to use his drums at home    Time 8    Period Weeks    Status On-going                   Plan - 11/01/20 1333     Clinical Impression Statement Trialed no AFO and trial of R foot up brace with gait with RW with pt demonstrating R foot flat contact and decr eccentric ankle DF control. Performed gait with SPC with quad tip with foot up brace  with pt catching his foot and needing min guard for balance. Discussed continued use of AFO for gait for safety and for gait mechanics. Remainder of session focused on standing balance strategies on compliant surfaces, SLS tasks on RLE, and RLE foot clearance over obstacles. Pt needing assist for RLE knee flexion when it is trailing limb to clear obstacles. Will continue to progress towards LTGs.    Personal Factors and Comorbidities Age;Fitness;Time since onset of injury/illness/exacerbation;Comorbidity 1;Past/Current Experience    Comorbidities L Achilles tendon tear, chronic R LE edema, CAD s/p CABG, HTN, asthma, type 2 diabetes, CKD stage III, HLD, remote R retinal detachment, HTN, CVA.    Examination-Activity Limitations Squat;Stairs;Caring for Others;Locomotion Level    Examination-Participation Restrictions Cleaning;Community Activity;Driving;Occupation;Shop;Yard Work    [employer] Evolving/Moderate complexity    Rehab Potential Good    PT Frequency 2x / week    PT Duration 8 weeks    PT Treatment/Interventions ADLs/Self Care Home Management;Aquatic Therapy;Cryotherapy;Electrical Stimulation;Moist Heat;DME Instruction;Gait training;Stair training;Functional mobility training;Therapeutic activities;Therapeutic exercise;Balance training;Neuromuscular re-education;Manual techniques;Patient/family education;Passive range of motion;Taping;Joint Manipulations;Vestibular    PT Next Visit Plan Continue gait training with SPC with quad tip - dynamic tasks and trial small distances in clinic with no AD. balance with head motions. Continue to progress R LE strengthening -hamstring, (and working on active DF/PF). RLE SLS tasks. obstacle negotiation.    PT Home Exercise Plan Access Code: [email or web address]; Walking program (09/17/20)- pt instructions    Consulted and Agree with Plan of Care Patient;Family member/caregiver             Patient will benefit from skilled therapeutic  intervention in order to improve the following deficits and impairments:  Abnormal gait, Decreased range of motion, Difficulty walking, Increased fascial restricitons, Impaired tone, Decreased activity tolerance, Decreased balance, Decreased mobility, Decreased strength, Increased edema  Visit Diagnosis: Unsteadiness on feet  Other abnormalities of gait and mobility  Muscle weakness (generalized)  Other lack of coordination     Problem List Patient Active Problem List   Diagnosis Date Noted   Left pontine stroke ([place]) 07/25/2020   Acute right hemiparesis ([facility]) 07/25/2020   Essential hypertension    History of CVA (cerebrovascular accident)    CKD (chronic kidney disease), stage II    Diabetes mellitus type 2 in obese ([facility])    Dyslipidemia    CVA (cerebral vascular accident) ([place]) 07/21/2020   Cryptogenic stroke ([place]) 06/18/2020   Acute-on-chronic kidney injury ([place]) 05/28/2020   Stroke ([facility]) 05/27/2020   Elevated troponin 01/13/2019  COVID-19 virus infection 01/13/2019   S/P CABG (coronary artery bypass graft)    CAD (coronary artery disease) 04/28/2011   Insulin dependent type 2 diabetes mellitus, controlled ([place])    Hyperlipidemia    Hypertension    Obesity (BMI 30-39.9)    Nephrolithiasis, uric acid     Arliss Journey, PT, DPT  11/01/2020, 1:36 PM  [facility] [address] [place], [place], [address] Phone: [phone]   Fax:  [phone]  Name: STELLAN FERMAN MRN: [id number] Date of Birth: 01/19/1951

## 2020-11-06 ENCOUNTER — Ambulatory Visit: Payer: Medicare Other | Admitting: Occupational Therapy

## 2020-11-06 ENCOUNTER — Encounter: Payer: Self-pay | Admitting: Occupational Therapy

## 2020-11-06 ENCOUNTER — Other Ambulatory Visit: Payer: Self-pay

## 2020-11-06 ENCOUNTER — Ambulatory Visit: Payer: Medicare Other

## 2020-11-06 DIAGNOSIS — R52 Pain, unspecified: Secondary | ICD-10-CM

## 2020-11-06 DIAGNOSIS — R2681 Unsteadiness on feet: Secondary | ICD-10-CM

## 2020-11-06 DIAGNOSIS — G8191 Hemiplegia, unspecified affecting right dominant side: Secondary | ICD-10-CM | POA: Diagnosis not present

## 2020-11-06 DIAGNOSIS — R41842 Visuospatial deficit: Secondary | ICD-10-CM

## 2020-11-06 DIAGNOSIS — R278 Other lack of coordination: Secondary | ICD-10-CM | POA: Diagnosis not present

## 2020-11-06 DIAGNOSIS — M6281 Muscle weakness (generalized): Secondary | ICD-10-CM

## 2020-11-06 DIAGNOSIS — R2689 Other abnormalities of gait and mobility: Secondary | ICD-10-CM | POA: Diagnosis not present

## 2020-11-06 NOTE — Therapy (Signed)
[facility] [address] [place], [place], [address] Phone: [phone]   Fax:  [phone]  Physical Therapy Treatment  Patient Details  Name: TJADEN BLAMER MRN: [id number] Date of Birth: [birth year] Referring Provider (PT): Garvin Fila, MD   Encounter Date: 11/06/2020   PT End of Session - 11/06/20 1235     Visit Number 15    Number of Visits 27    Date for PT Re-Evaluation [id number]   per re-cert on 0000000   Authorization Type Medicare - will need 10th visit PN    Progress Note Due on Visit 20    PT Start Time 1231    PT Stop Time 1314    PT Time Calculation (min) 43 min    Equipment Utilized During Treatment Gait belt    Activity Tolerance Patient tolerated treatment well    Behavior During Therapy WFL for tasks assessed/performed             Past Medical History:  Diagnosis Date   Angina    NO ANGINA SINCE CABG   Arthritis    right ankle   Chronic daily headache    "@ least every other day""improved since heart surgery"   Coronary artery disease    PT [place] CABG SURGERY - DR. TILLEY IS HIS CARDIOLOGIST   Diabetes mellitus    Lantus x 10 yrs   Gout    History of kidney stones 09-08-12   multiple times with some lithotripsies   Hyperlipemia    Hypertension     Past Surgical History:  Procedure Laterality Date   BACK SURGERY     microsurgery: spinal stenosis -lumbar   bone spur  1990's   right great toe; "probably related to gout"   BUBBLE STUDY  06/17/2020   Procedure: BUBBLE STUDY;  Surgeon: Lelon Perla, MD;  Location: [facility];  Service: Cardiovascular;;   CARDIAC CATHETERIZATION  04/27/11   CATARACT EXTRACTION W/ INTRAOCULAR LENS IMPLANT  1990's   right; "lens was replaced twice over 3 months; lens is actually over iris"   CORONARY ARTERY BYPASS GRAFT  04/30/2011   Procedure: CORONARY ARTERY BYPASS GRAFTING (CABG);  Surgeon: Melrose Nakayama, MD;   Location: [facility];  Service: Open Heart Surgery;  Laterality: N/A;,x4 vessels   CYSTOSCOPY W/ URETERAL STENT PLACEMENT Right 08/19/2012   Procedure: CYSTOSCOPY WITH RETROGRADE PYELOGRAM/URETERAL STENT PLACEMENT;  Surgeon: Molli Hazard, MD;  Location: [facility];  Service: Urology;  Laterality: Right;   CYSTOSCOPY WITH RETROGRADE PYELOGRAM, URETEROSCOPY AND STENT PLACEMENT Left 07/17/2013   Procedure: CYSTOSCOPY, LEFT URETEROSCOPY, BASKET EXTRACTION OF STONE, INSERTION OF LEFT URETERAL STENT, ;  Surgeon: Jorja Loa, MD;  [facility];  Service: Urology;  Laterality: Left;   CYSTOSCOPY/RETROGRADE/URETEROSCOPY Right 09/12/2012   Procedure: CYSTOSCOPY/RETROGRADE/URETEROSCOPY;  Surgeon: Franchot Gallo, MD;  [facility];  Service: Urology;  Laterality: Right;   HOLMIUM LASER APPLICATION Right [id number]   Procedure: HOLMIUM LASER APPLICATION;  Surgeon: Franchot Gallo, MD;  [facility];  Service: Urology;  Laterality: Right;   HOLMIUM LASER APPLICATION Left [id number]   Procedure: HOLMIUM LASER APPLICATION;  Surgeon: Jorja Loa, MD;  [facility];  Service: Urology;  Laterality: Left;   KIDNEY STONE SURGERY     "probably 3 times"   LEFT HEART CATHETERIZATION WITH CORONARY ANGIOGRAM N/A 04/28/2011   Procedure: LEFT HEART CATHETERIZATION WITH CORONARY ANGIOGRAM;  Surgeon: Jacolyn Reedy, MD;  Location: [facility] CATH LAB;  Service: Cardiovascular;  Laterality: N/A;   LITHOTRIPSY     "many; probably 5 times"   RADIAL ARTERY HARVEST  04/30/2011   Procedure: RADIAL ARTERY HARVEST;  Surgeon: Melrose Nakayama, MD;  Location: [facility];  Service: Open Heart Surgery;  Laterality: Left;   RETINAL DETACHMENT SURGERY  1990's   right eye; "made me have an early cataract"   TEE WITHOUT CARDIOVERSION N/A 06/17/2020   Procedure: TRANSESOPHAGEAL ECHOCARDIOGRAM (TEE);  Surgeon: Lelon Perla, MD;  Location: [facility] ENDOSCOPY;  Service: Cardiovascular;  Laterality: N/A;   VASECTOMY       There were no vitals filed for this visit.   Subjective Assessment - 11/06/20 1237     Subjective Patient asking about how long PT will continue for. No new changes/complaints. No falls.    Patient is accompained by: Family member    Pertinent History achilles tendon tear on L    Limitations Standing;Sitting;House hold activities    How long can you stand comfortably? No issues    How long can you walk comfortably? In house only    Patient Stated Goals Return to their own home, play drums again    Currently in Pain? No/denies    Pain Onset More than a month ago                [facility] Adult PT Treatment/Exercise - 11/06/20 0001       Transfers   Transfers Sit to Stand;Stand to Sit    Sit to Stand 5: Supervision    Stand to Sit 5: Supervision      Ambulation/Gait   Ambulation/Gait Yes    Ambulation/Gait Assistance 5: Supervision;4: Min guard    Ambulation/Gait Assistance Details Completed gait training with SPC x 200 ft. Progressed off AD and completed ambulation x 230 ft with no AD and close supervision, intermittent CGA. Cues for upright posture. Increased fatigue noted without use of AD. Throughout session continued gait without AD.    Ambulation Distance (Feet) 200 Feet   x 1; 230 x 2 without AD   Assistive device Straight cane;None    Gait Pattern Step-through pattern;Decreased stance time - right;Decreased dorsiflexion - right;Decreased weight shift to right;Right foot flat    Ambulation Surface Level;Indoor    Ramp 5: Supervision    Ramp Details (indicate cue type and reason) worked on ambulation up/down ramp/incline without AD, increased challenge with descent but able to maintain balance without Assistance from PT. cues for upright posture and standing tall through RLE with stance    Curb 5: Supervision    Curb Details (indicate cue type and reason) x 4 reps indoors without AD, continue to require cues for sequencing initailly but demo improved carryover with  increased repetitions.      High Level Balance   High Level Balance Activities Negotiating over obstacles;Head turns    High Level Balance Comments without AD completed forward ambulation wtih stepping/negotiating over hurdles x 3 laps through. supervision and intermittent CGA. Continue to demo more steadiness when leading with RLE vs. trailing. Without AD completed ambulation x 230 ft with addition of horizontal/vertical head turns on command, increased challenge with vertical. Continues to close eye with head turn to L side due to reports of diplopia.      Exercises   Exercises Knee/Hip      Knee/Hip Exercises: Machines for Strengthening   Cybex Leg Press Completed with BLE 110# x 10 reps with cues to avoid locking out into knee extension, then completed 120#  x 10 reps. Patient tolerating increase in resistance well. Seated rest break required.                 Balance Exercises - 11/06/20 0001       Balance Exercises: Standing   Standing Eyes Opened Foam/compliant surface;Narrow base of support (BOS)    Standing Eyes Opened Limitations on blue airex: completed horizontal/vertical head turns x 10 reps each, increased sway with vertical.    Sit to Stand Standard surface;Foam/compliant surface;Without upper extremity support;Limitations    Sit to Stand Limitations completed x 10 reps with BLE placed on airex no UE support used, cues for eccentric control.                 PT Short Term Goals - 10/11/20 1347       PT SHORT TERM GOAL #1   Title Pt will perform 8 steps with SPC and single handrail with supervision in order to demo improved community access. ALL STGS DUE 11/08/20    Time 4    Period Weeks    Status New    Target Date 11/08/20      PT SHORT TERM GOAL #2   Title Pt will perform a curb with SPC with quad tip and supervision in order to demo improved community mobility.    Baseline requires min guard    Time 4    Period Weeks    Status New      PT SHORT  TERM GOAL #3   Title Pt will decr TUG time with SPC to at least 12 seconds or less to demo decr fall risk.    Baseline 13.59 seconds    Time 4    Period Weeks    Status New      PT SHORT TERM GOAL #4   Title Pt will ambulate at least 250' outdoors with [facility] with quad tip and supervision in order to demo improved community mobility.    Baseline needs min guard, limited distances outdoors due to light sensitivity    Time 4    Period Weeks    Status New               PT Long Term Goals - 10/11/20 1350       PT LONG TERM GOAL #1   Title Pt will be independent with advanced HEP for strengthening and balance ALL LTGS DUE 12/06/20    Baseline did not have time to assess, pt will benefit from ongoing additions/updates    Time 8    Period Weeks    Status On-going    Target Date 12/06/20      PT LONG TERM GOAL #2   Title Pt will improve gait speed with cane to at least 2.75 ft/sec in order to demo improved community mobility.    Baseline 2.34 ft/sec    Time 8    Period Weeks    Status New      PT LONG TERM GOAL #3   Title Pt will have improved DGI score to at least 19/24 to demo decreased fall risk    Baseline 17/24; 15/24 ON 10/08/20    Time 8    Period Weeks    Status On-going      PT LONG TERM GOAL #4   Title Pt will be able to amb at least 500' with LRAD mod I on level and unlevel surfaces for community amb    Baseline need to use RW for longer outdoor  distances, did not have time to do distance outdoors today/too hot    Time 8    Period Weeks    Status Revised      PT LONG TERM GOAL #5   Title Pt will be able to use R foot/ankle for drumming to return to leisure activities    Baseline has been trying to use his drums at home    Time 8    Period Weeks    Status On-going                   Plan - 11/06/20 1319     Clinical Impression Statement Continued gait training today mainly wihtout use of AD. Paitent demonstating significant improvements in step  length wihtout use of AD, potential due to cognitve challenge of using AD/sequencing. Continued high level balance with ambulation, including negotiating over obstacles, curbs, and ramps with patient tolerating well. Intermittent fatigue in RLE require rest breaks. Will continue to progress toward all LTGs.    Personal Factors and Comorbidities Age;Fitness;Time since onset of injury/illness/exacerbation;Comorbidity 1;Past/Current Experience    Comorbidities L Achilles tendon tear, chronic R LE edema, CAD s/p CABG, HTN, asthma, type 2 diabetes, CKD stage III, HLD, remote R retinal detachment, HTN, CVA.    Examination-Activity Limitations Squat;Stairs;Caring for Others;Locomotion Level    Examination-Participation Restrictions Cleaning;Community Activity;Driving;Occupation;Shop;Yard Work    [employer] Evolving/Moderate complexity    Rehab Potential Good    PT Frequency 2x / week    PT Duration 8 weeks    PT Treatment/Interventions ADLs/Self Care Home Management;Aquatic Therapy;Cryotherapy;Electrical Stimulation;Moist Heat;DME Instruction;Gait training;Stair training;Functional mobility training;Therapeutic activities;Therapeutic exercise;Balance training;Neuromuscular re-education;Manual techniques;Patient/family education;Passive range of motion;Taping;Joint Manipulations;Vestibular    PT Next Visit Plan Continue gait training with no AD- dynamic tasks and trial small distances in clinic with no AD. balance with head motions. Continue to progress R LE strengthening -hamstring, (and working on active DF/PF). RLE SLS tasks. obstacle negotiation.    PT Home Exercise Plan Access Code: [email or web address]; Walking program (09/17/20)- pt instructions    Consulted and Agree with Plan of Care Patient;Family member/caregiver             Patient will benefit from skilled therapeutic intervention in order to improve the following deficits and impairments:  Abnormal gait, Decreased range of  motion, Difficulty walking, Increased fascial restricitons, Impaired tone, Decreased activity tolerance, Decreased balance, Decreased mobility, Decreased strength, Increased edema  Visit Diagnosis: Unsteadiness on feet  Other abnormalities of gait and mobility  Muscle weakness (generalized)     Problem List Patient Active Problem List   Diagnosis Date Noted   Left pontine stroke ([place]) 07/25/2020   Acute right hemiparesis ([place]) 07/25/2020   Essential hypertension    History of CVA (cerebrovascular accident)    CKD (chronic kidney disease), stage II    Diabetes mellitus type 2 in obese (HCC)    Dyslipidemia    CVA (cerebral vascular accident) ([place]) 07/21/2020   Cryptogenic stroke ([place]) 06/18/2020   Acute-on-chronic kidney injury ([place]) 05/28/2020   Stroke ([place]) 05/27/2020   Elevated troponin 01/13/2019   COVID-19 virus infection 01/13/2019   S/P CABG (coronary artery bypass graft)    CAD (coronary artery disease) 04/28/2011   Insulin dependent type 2 diabetes mellitus, controlled ([place])    Hyperlipidemia    Hypertension    Obesity (BMI 30-39.9)    Nephrolithiasis, uric acid     Jones Bales, PT, DPT 11/06/2020, 1:22 PM  [facility] [id number]  [place], [place], [address] Phone: [phone]   Fax:  [phone]  Name: ALISTAIR SOLIZ MRN: [id number] Date of Birth: 12-08-1950

## 2020-11-06 NOTE — Therapy (Signed)
[facility] [address] [place], [place], [address] Phone: [phone]   Fax:  [phone]  Occupational Therapy Treatment and Discharge Summary  Patient Details  Name: Mitchell Lambert MRN: [id number] Date of Birth: 1950-12-17 Referring Provider (OT): Dr. Leonie Man   Encounter Date: 11/06/2020   OT End of Session - 11/06/20 1350     Visit Number 17    Number of Visits 24    Date for OT Re-Evaluation 11/12/20    Authorization Type Medicare    Progress Note Due on Visit 68    OT Start Time 1315    OT Stop Time 1345    OT Time Calculation (min) 30 min             Past Medical History:  Diagnosis Date   Angina    NO ANGINA SINCE CABG   Arthritis    right ankle   Chronic daily headache    "@ least every other day""improved since heart surgery"   Coronary artery disease    PT [place] CABG SURGERY - DR. TILLEY IS HIS CARDIOLOGIST   Diabetes mellitus    Lantus x 10 yrs   Gout    History of kidney stones 09-08-12   multiple times with some lithotripsies   Hyperlipemia    Hypertension     Past Surgical History:  Procedure Laterality Date   BACK SURGERY     microsurgery: spinal stenosis -lumbar   bone spur  1990's   right great toe; "probably related to gout"   BUBBLE STUDY  06/17/2020   Procedure: BUBBLE STUDY;  Surgeon: Lelon Perla, MD;  Location: [facility];  Service: Cardiovascular;;   CARDIAC CATHETERIZATION  04/27/11   CATARACT EXTRACTION W/ INTRAOCULAR LENS IMPLANT  1990's   right; "lens was replaced twice over 3 months; lens is actually over iris"   CORONARY ARTERY BYPASS GRAFT  04/30/2011   Procedure: CORONARY ARTERY BYPASS GRAFTING (CABG);  Surgeon: Melrose Nakayama, MD;  Location: [facility];  Service: Open Heart Surgery;  Laterality: N/A;,x4 vessels   CYSTOSCOPY W/ URETERAL STENT PLACEMENT Right 08/19/2012   Procedure: CYSTOSCOPY WITH RETROGRADE PYELOGRAM/URETERAL STENT  PLACEMENT;  Surgeon: Molli Hazard, MD;  [facility];  Service: Urology;  Laterality: Right;   CYSTOSCOPY WITH RETROGRADE PYELOGRAM, URETEROSCOPY AND STENT PLACEMENT Left 07/17/2013   Procedure: CYSTOSCOPY, LEFT URETEROSCOPY, BASKET EXTRACTION OF STONE, INSERTION OF LEFT URETERAL STENT, ;  Surgeon: Jorja Loa, MD;  [facility];  Service: Urology;  Laterality: Left;   CYSTOSCOPY/RETROGRADE/URETEROSCOPY Right 09/12/2012   Procedure: CYSTOSCOPY/RETROGRADE/URETEROSCOPY;  Surgeon: Franchot Gallo, MD;  [facility];  Service: Urology;  Laterality: Right;   HOLMIUM LASER APPLICATION Right 8/33/3832   Procedure: HOLMIUM LASER APPLICATION;  Surgeon: Franchot Gallo, MD;  [facility];  Service: Urology;  Laterality: Right;   HOLMIUM LASER APPLICATION Left 11/19/1658   Procedure: HOLMIUM LASER APPLICATION;  Surgeon: Jorja Loa, MD;  [facility];  Service: Urology;  Laterality: Left;   KIDNEY STONE SURGERY     "probably 3 times"   LEFT HEART CATHETERIZATION WITH CORONARY ANGIOGRAM N/A 04/28/2011   Procedure: LEFT HEART CATHETERIZATION WITH CORONARY ANGIOGRAM;  Surgeon: Jacolyn Reedy, MD;  Location[facility] CATH LAB;  Service: Cardiovascular;  Laterality: N/A;   LITHOTRIPSY     "many; probably 5 times"   RADIAL ARTERY HARVEST  04/30/2011   Procedure: RADIAL ARTERY HARVEST;  Surgeon: Melrose Nakayama, MD;  [facility]: [facility];  Service: Open Heart Surgery;  Laterality: Left;   RETINAL DETACHMENT SURGERY  1990's   right eye; "made me have an early cataract"   TEE WITHOUT CARDIOVERSION N/A 06/17/2020   Procedure: TRANSESOPHAGEAL ECHOCARDIOGRAM (TEE);  Surgeon: Lelon Perla, MD;  Location: [facility] ENDOSCOPY;  Service: Cardiovascular;  Laterality: N/A;   VASECTOMY      There were no vitals filed for this visit.                 OT Treatments/Exercises (OP) - 11/06/20 1345       ADLs   Home Maintenance Patient reports being able to change air  filters in his home, and hand simple pictures.  Has been doing some minor maintenance in yard - lives in a town house - so minimal required.    Work Patient has opted to retire from his current job - he was planning to retire at the end of this year, or at the latest by January.    ADL Comments Patient has met all OT goals and is agreeable to discharge.                  Upper Extremity Functional Index Score :   /80   OT Education - 11/06/20 1350     Education Details discharge    Person(s) Educated Patient    Methods Explanation    Comprehension Verbalized understanding              OT Short Term Goals - 11/06/20 1352       OT SHORT TERM GOAL #1   Title I with HEP for vision, coordination    Time 4    Period Weeks    Status Achieved    Target Date 09/17/20      OT SHORT TERM GOAL #2   Title Pt will demonstrate improved RUE fine motor coordination for ADLS as evidenced by decreasing 9 hole peg test score to 52 secs or less    Time 4    Period Weeks    Status Achieved   09/26/20 - 50 sec 1st trial; 34 sec 2nd trial     OT SHORT TERM GOAL #3   Title Pt will report that he is cutting food independently and  feeding himself with RUE at least 75% of the time.    Time 4    Period Weeks    Status Achieved      OT SHORT TERM GOAL #4   Title Pt will perfrom all basic ADLS with distant supervision.    Time 4    Period Weeks    Status Achieved      OT SHORT TERM GOAL #5   Title Pt will demonstrate ability to retrieve a lightweight object at 110* shoulder flexion with good control and min compensation.    Time 4    Period Weeks    Status Achieved   120     OT SHORT TERM GOAL #6   Title Pt will increase RUE grip strength to 45 lbs or greater for increased functional use.    Time 4    Period Weeks    Status Achieved   56.4 lbs     OT SHORT TERM GOAL #7   Title Pt will verbalzie understanding of compensatroy strategies for visual deficits    Time 4    Period  Weeks    Status Achieved  OT Long Term Goals - 11/06/20 1352       OT LONG TERM GOAL #1   Title I with all basic ADLS.    Time 12    Period Weeks    Status Achieved      OT LONG TERM GOAL #2   Title Pt will demonstrate improved fine motor coordination for ADLs as evidenced by performing 9 hole peg test in 45 secs or less.    Time 12    Period Weeks    Status Achieved   33 sec     OT LONG TERM GOAL #3   Title Pt will perform simple snack/ beverage prep modified Independently.    Time 12    Period Weeks    Status Achieved      OT LONG TERM GOAL #4   Title Pt will retrieve a 3 lbs object with RUE at 115 shoulder flexion with good control.    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    Time 12    Period Weeks    Status Achieved                   Plan - 11/06/20 1351     Clinical Impression Statement Patient has met all OT goals and is agreeable to discharge    OT Occupational Profile and History Detailed Assessment- Review of Records and additional review of physical, cognitive, psychosocial history related to current functional performance    Occupational performance deficits (Please refer to evaluation for details): ADL's;IADL's;Play;Work;Leisure;Social Participation    Body Structure / Function / Physical Skills ADL;Balance;Endurance;Mobility;Strength;Flexibility;UE functional use;FMC;Coordination;Gait;Vision;ROM;GMC;Decreased knowledge of precautions;Decreased knowledge of use of DME;Dexterity;IADL    Rehab Potential Good    Clinical Decision  Making Limited treatment options, no task modification necessary    Comorbidities Affecting Occupational Performance: May have comorbidities impacting occupational performance    Modification or Assistance to Complete Evaluation  No modification of tasks or assist necessary to complete eval    OT Frequency 2x / week    OT Duration 12 weeks    OT Treatment/Interventions Self-care/ADL training;Therapeutic exercise;Functional Mobility Training;Balance training;Ultrasound;Neuromuscular education;Manual Therapy;Therapeutic activities;Paraffin;Cryotherapy;DME and/or AE instruction;Cognitive remediation/compensation;Visual/perceptual remediation/compensation;Fluidtherapy;Moist Heat;Electrical Stimulation;Passive range of motion;Patient/family education;Energy conservation;Aquatic Therapy;Gait Training    Plan discharge    Consulted and Agree with Plan of Care Patient             Patient will benefit from skilled therapeutic intervention in order to improve the following deficits and impairments:   Body Structure / Function / Physical Skills: ADL, Balance, Endurance, Mobility, Strength, Flexibility, UE functional use, FMC, Coordination, Gait, Vision, ROM, GMC, Decreased knowledge of precautions, Decreased knowledge of use of DME, Dexterity, IADL       Visit Diagnosis: Muscle weakness (generalized)  Other lack of coordination  Acute right hemiparesis (HCC)  Visuospatial deficit  Unsteadiness on feet  Pain    Problem List Patient Active Problem List   Diagnosis Date Noted   Left pontine  stroke ([place]) 07/25/2020   Acute right hemiparesis ([place]) 07/25/2020   Essential hypertension    History of CVA (cerebrovascular accident)    CKD (chronic kidney disease), stage II    Diabetes mellitus type 2 in obese (HCC)    Dyslipidemia    CVA (cerebral vascular accident) ([place]) 07/21/2020   Cryptogenic stroke ([place]) 06/18/2020   Acute-on-chronic kidney injury ([place]) 05/28/2020   Stroke ([place])  05/27/2020   Elevated troponin 01/13/2019   COVID-19 virus infection 01/13/2019   S/P CABG (coronary artery bypass graft)    CAD (coronary artery disease) 04/28/2011   Insulin dependent type 2 diabetes mellitus, controlled ([place])    Hyperlipidemia    Hypertension    Obesity (BMI 30-39.9)    Nephrolithiasis, uric acid   OCCUPATIONAL THERAPY DISCHARGE SUMMARY  Visits from Start of Care: 17   Current functional level related to goals / functional outcomes: Modified ndependent with ADL/IADL   Remaining deficits: Increased muscle tone RUE, Balance, Diplopia   Education / Equipment: HEP/Home Activities Program to return to prior level of functioning as much as possible   Patient agrees to discharge. Patient goals were met. Patient is being discharged due to meeting the stated rehab goals.Mariah Milling, OT/L 11/06/2020, 1:54 PM  [facility] [address] [place], [place], [address] Phone: [phone]   Fax:  [phone]  Name: MIQUAN TANDON MRN: [id number] Date of Birth: 12-Nov-1950

## 2020-11-07 ENCOUNTER — Other Ambulatory Visit: Payer: Self-pay | Admitting: Physical Medicine & Rehabilitation

## 2020-11-08 ENCOUNTER — Ambulatory Visit: Payer: Medicare Other

## 2020-11-11 ENCOUNTER — Ambulatory Visit: Payer: Medicare Other | Admitting: Occupational Therapy

## 2020-11-11 ENCOUNTER — Telehealth: Payer: Self-pay | Admitting: *Deleted

## 2020-11-11 NOTE — Progress Notes (Signed)
[facility] Summary Report / Loop Recorder 

## 2020-11-11 NOTE — Telephone Encounter (Signed)
She called the primary care and he addressed the issue.

## 2020-11-11 NOTE — Telephone Encounter (Signed)
Mitchell Lambert called to report that when Mitchell Lambert was in inpt rehab, he had a gout flare and Dr Letta Pate gave him some medication.  He is having another flare and cannot walk. She is asking for Dr Letta Pate advice.

## 2020-11-12 ENCOUNTER — Ambulatory Visit: Payer: Medicare Other | Admitting: Adult Health

## 2020-11-13 ENCOUNTER — Ambulatory Visit: Payer: Medicare Other

## 2020-11-13 ENCOUNTER — Encounter: Payer: PRIVATE HEALTH INSURANCE | Admitting: Occupational Therapy

## 2020-11-15 ENCOUNTER — Ambulatory Visit: Payer: Medicare Other

## 2020-11-18 ENCOUNTER — Ambulatory Visit: Payer: PRIVATE HEALTH INSURANCE | Admitting: Occupational Therapy

## 2020-11-20 ENCOUNTER — Ambulatory Visit: Payer: Medicare Other

## 2020-11-20 ENCOUNTER — Encounter: Payer: PRIVATE HEALTH INSURANCE | Admitting: Occupational Therapy

## 2020-11-20 ENCOUNTER — Other Ambulatory Visit: Payer: Self-pay

## 2020-11-20 DIAGNOSIS — R2689 Other abnormalities of gait and mobility: Secondary | ICD-10-CM | POA: Diagnosis not present

## 2020-11-20 DIAGNOSIS — R2681 Unsteadiness on feet: Secondary | ICD-10-CM | POA: Diagnosis not present

## 2020-11-20 DIAGNOSIS — M6281 Muscle weakness (generalized): Secondary | ICD-10-CM

## 2020-11-20 DIAGNOSIS — R278 Other lack of coordination: Secondary | ICD-10-CM | POA: Diagnosis not present

## 2020-11-20 DIAGNOSIS — R41842 Visuospatial deficit: Secondary | ICD-10-CM | POA: Diagnosis not present

## 2020-11-20 DIAGNOSIS — G8191 Hemiplegia, unspecified affecting right dominant side: Secondary | ICD-10-CM | POA: Diagnosis not present

## 2020-11-20 NOTE — Therapy (Signed)
[facility] [address] [place], [place], [address] Phone: [phone]   Fax:  [phone]  Physical Therapy Treatment  Patient Details  Name: Mitchell Lambert MRN: [id number] Date of Birth: 09/14/1950 Referring Provider (PT): Garvin Fila, MD   Encounter Date: 11/20/2020   PT End of Session - 11/20/20 1232     Visit Number 16    Number of Visits 27    Date for PT Re-Evaluation 26/94/85   per re-cert on 06/05/25   Authorization Type Medicare - will need 10th visit PN    Progress Note Due on Visit 20    PT Start Time 1232    PT Stop Time 1313    PT Time Calculation (min) 41 min    Equipment Utilized During Treatment Gait belt    Activity Tolerance Patient tolerated treatment well    Behavior During Therapy WFL for tasks assessed/performed             Past Medical History:  Diagnosis Date   Angina    NO ANGINA SINCE CABG   Arthritis    right ankle   Chronic daily headache    "@ least every other day""improved since heart surgery"   Coronary artery disease    PT [facility] CABG SURGERY - DR. TILLEY IS HIS CARDIOLOGIST   Diabetes mellitus    Lantus x 10 yrs   Gout    History of kidney stones 09-08-12   multiple times with some lithotripsies   Hyperlipemia    Hypertension     Past Surgical History:  Procedure Laterality Date   BACK SURGERY     microsurgery: spinal stenosis -lumbar   bone spur  1990's   right great toe; "probably related to gout"   BUBBLE STUDY  06/17/2020   Procedure: BUBBLE STUDY;  Surgeon: Lelon Perla, MD;  Location: [facility];  Service: Cardiovascular;;   CARDIAC CATHETERIZATION  04/27/11   CATARACT EXTRACTION W/ INTRAOCULAR LENS IMPLANT  1990's   right; "lens was replaced twice over 3 months; lens is actually over iris"   CORONARY ARTERY BYPASS GRAFT  04/30/2011   Procedure: CORONARY ARTERY BYPASS GRAFTING (CABG);  Surgeon: Melrose Nakayama, MD;   Location: [facility];  Service: Open Heart Surgery;  Laterality: N/A;,x4 vessels   CYSTOSCOPY W/ URETERAL STENT PLACEMENT Right 08/19/2012   Procedure: CYSTOSCOPY WITH RETROGRADE PYELOGRAM/URETERAL STENT PLACEMENT;  Surgeon: Molli Hazard, MD;  Location: [facility];  Service: Urology;  Laterality: Right;   CYSTOSCOPY WITH RETROGRADE PYELOGRAM, URETEROSCOPY AND STENT PLACEMENT Left 07/17/2013   Procedure: CYSTOSCOPY, LEFT URETEROSCOPY, BASKET EXTRACTION OF STONE, INSERTION OF LEFT URETERAL STENT, ;  Surgeon: Jorja Loa, MD;  Location: [facility];  Service: Urology;  Laterality: Left;   CYSTOSCOPY/RETROGRADE/URETEROSCOPY Right 09/12/2012   Procedure: CYSTOSCOPY/RETROGRADE/URETEROSCOPY;  Surgeon: Franchot Gallo, MD;  Location: [facility];  Service: Urology;  Laterality: Right;   HOLMIUM LASER APPLICATION Right 0/35/0093   Procedure: HOLMIUM LASER APPLICATION;  Surgeon: Franchot Gallo, MD;  Location: [facility];  Service: Urology;  Laterality: Right;   HOLMIUM LASER APPLICATION Left 10/18/2991   Procedure: HOLMIUM LASER APPLICATION;  Surgeon: Jorja Loa, MD;  Location: [facility];  Service: Urology;  Laterality: Left;   KIDNEY STONE SURGERY     "probably 3 times"   LEFT HEART CATHETERIZATION WITH CORONARY ANGIOGRAM N/A 04/28/2011   Procedure: LEFT HEART CATHETERIZATION WITH CORONARY ANGIOGRAM;  Surgeon: Jacolyn Reedy, MD;  [facility] C[facility];  Service: Cardiovascular;  Laterality: N/A;   LITHOTRIPSY     "many; probably 5 times"   RADIAL ARTERY HARVEST  04/30/2011   Procedure: RADIAL ARTERY HARVEST;  Surgeon: Melrose Nakayama, MD;  Location: [facility];  Service: Open Heart Surgery;  Laterality: Left;   RETINAL DETACHMENT SURGERY  1990's   right eye; "made me have an early cataract"   TEE WITHOUT CARDIOVERSION N/A 06/17/2020   Procedure: TRANSESOPHAGEAL ECHOCARDIOGRAM (TEE);  Surgeon: Lelon Perla, MD;  Location[facility] ENDOSCOPY;  Service: Cardiovascular;  Laterality: N/A;   VASECTOMY       There were no vitals filed for this visit.   Subjective Assessment - 11/20/20 1235     Subjective Patient reports had to cancel appts last week due to having a flare up of Gout. No falls. Having some swelling in the R Leg.    Patient is accompained by: Family member    Pertinent History achilles tendon tear on L    Limitations Standing;Sitting;House hold activities    How long can you stand comfortably? No issues    How long can you walk comfortably? In house only    Patient Stated Goals Return to their own home, play drums again    Currently in Pain? Yes    Pain Score 3     Pain Location Leg    Pain Orientation Right    Pain Descriptors / Indicators Aching;Discomfort    Pain Type Acute pain    Pain Onset 1 to 4 weeks ago    Pain Frequency Intermittent               [facility] Adult PT Treatment/Exercise - 11/20/20 0001       Transfers   Transfers Sit to Stand;Stand to Sit    Sit to Stand 5: Supervision    Stand to Sit 5: Supervision      Ambulation/Gait   Ambulation/Gait Yes    Ambulation/Gait Assistance 5: Supervision    Ambulation/Gait Assistance Details Completed ambulation x 700 ft outdoors on paved surfaces with superivsion, no imbalance. Completed incline on pavement with no issues noted. Continue to demo proper sequencing.    Ambulation Distance (Feet) 700 Feet    Assistive device Straight cane    Gait Pattern Step-through pattern;Decreased stance time - right;Decreased dorsiflexion - right;Decreased weight shift to right;Right foot flat    Ambulation Surface Level;Indoor;Unlevel;Outdoor;Paved    Stairs Yes    Stairs Assistance 5: Supervision    Stairs Assistance Details (indicate cue type and reason) completed ambulation ascend/descend stairs x 4 initiailly with step to pattern progressing to reciprocal pattern x 12 stairs.    Stair Management Technique One rail Left;With cane;Step to pattern;Alternating pattern;Forwards    Number of Stairs 16    Height of  Stairs 6    Ramp 5: Supervision    Ramp Details (indicate cue type and reason) completed gentle incline/decline outdoors on paved surfaces,    Curb 5: Supervision    Curb Details (indicate cue type and reason) Completed x 3 reps ascend/descend curb outdoors. no imbalance noted and proper sequencing. Supervision.      Standardized Balance Assessment   Standardized Balance Assessment Timed Up and Go Test      Timed Up and Go Test   TUG Normal TUG    Normal TUG (seconds) 10.97   with SPC; 10.79 seconds without SPC     Exercises   Exercises Knee/Hip      Knee/Hip Exercises: Aerobic   Other Aerobic  Completed SciFit on Level 2.5 x  6 minutes for with BLE/BUE for improved strenghtening, endurance, and activity tolerance. Patient tolerating well.               PT Education - 11/20/20 1237     Education Details progress toward STGs    Person(s) Educated Patient    Methods Explanation    Comprehension Verbalized understanding              PT Short Term Goals - 11/20/20 1240       PT SHORT TERM GOAL #1   Title Pt will perform 8 steps with SPC and single handrail with supervision in order to demo improved community access. ALL STGS DUE 11/08/20    Baseline 16 steps with SPC, reciprocal stepping and single handrail with supervision    Time 4    Period Weeks    Status Achieved    Target Date 11/08/20      PT SHORT TERM GOAL #2   Title Pt will perform a curb with SPC with quad tip and supervision in order to demo improved community mobility.    Baseline requires min guard; curb x 3 reps with supervision and SPC    Time 4    Period Weeks    Status Achieved      PT SHORT TERM GOAL #3   Title Pt will decr TUG time with SPC to at least 12 seconds or less to demo decr fall risk.    Baseline 13.59 seconds; 10.97 secs with SPC    Time 4    Period Weeks    Status Achieved      PT SHORT TERM GOAL #4   Title Pt will ambulate at least 250' outdoors with [facility] with quad tip and  supervision in order to demo improved community mobility.    Baseline needs min guard, limited distances outdoors due to light sensitivity; 750 outdoors supervision with SPC    Time 4    Period Weeks    Status Achieved               PT Long Term Goals - 10/11/20 1350       PT LONG TERM GOAL #1   Title Pt will be independent with advanced HEP for strengthening and balance ALL LTGS DUE 12/06/20    Baseline did not have time to assess, pt will benefit from ongoing additions/updates    Time 8    Period Weeks    Status On-going    Target Date 12/06/20      PT LONG TERM GOAL #2   Title Pt will improve gait speed with cane to at least 2.75 ft/sec in order to demo improved community mobility.    Baseline 2.34 ft/sec    Time 8    Period Weeks    Status New      PT LONG TERM GOAL #3   Title Pt will have improved DGI score to at least 19/24 to demo decreased fall risk    Baseline 17/24; 15/24 ON 10/08/20    Time 8    Period Weeks    Status On-going      PT LONG TERM GOAL #4   Title Pt will be able to amb at least 500' with LRAD mod I on level and unlevel surfaces for community amb    Baseline need to use RW for longer outdoor distances, did not have time to do distance outdoors today/too hot    Time 8  Period Weeks    Status Revised      PT LONG TERM GOAL #5   Title Pt will be able to use R foot/ankle for drumming to return to leisure activities    Baseline has been trying to use his drums at home    Time 8    Period Weeks    Status On-going                   Plan - 11/20/20 1236     Clinical Impression Statement Completed assessment of patient's progress toward STGs. Patient able to meet all STGs today, demonstrating improved gait outdoors, curb negoitation, and stair negoitation. Patient improved TUG with SPC to 10.97 seconds. Patient is making steady progress with PT services and will continue to benefit from skilled PT to progress toward LTGs.    Personal  Factors and Comorbidities Age;Fitness;Time since onset of injury/illness/exacerbation;Comorbidity 1;Past/Current Experience    Comorbidities L Achilles tendon tear, chronic R LE edema, CAD s/p CABG, HTN, asthma, type 2 diabetes, CKD stage III, HLD, remote R retinal detachment, HTN, CVA.    Examination-Activity Limitations Squat;Stairs;Caring for Others;Locomotion Level    Examination-Participation Restrictions Cleaning;Community Activity;Driving;Occupation;Shop;Yard Work    [employer] Evolving/Moderate complexity    Rehab Potential Good    PT Frequency 2x / week    PT Duration 8 weeks    PT Treatment/Interventions ADLs/Self Care Home Management;Aquatic Therapy;Cryotherapy;Electrical Stimulation;Moist Heat;DME Instruction;Gait training;Stair training;Functional mobility training;Therapeutic activities;Therapeutic exercise;Balance training;Neuromuscular re-education;Manual techniques;Patient/family education;Passive range of motion;Taping;Joint Manipulations;Vestibular    PT Next Visit Plan Continue gait training with no AD- dynamic tasks and trial small distances in clinic with no AD. balance with head motions. Continue to progress R LE strengthening -hamstring, (and working on active DF/PF). RLE SLS tasks. obstacle negotiation.    PT Home Exercise Plan Access Code: [email or web address]; Walking program (09/17/20)- pt instructions    Consulted and Agree with Plan of Care Patient;Family member/caregiver             Patient will benefit from skilled therapeutic intervention in order to improve the following deficits and impairments:  Abnormal gait, Decreased range of motion, Difficulty walking, Increased fascial restricitons, Impaired tone, Decreased activity tolerance, Decreased balance, Decreased mobility, Decreased strength, Increased edema  Visit Diagnosis: Muscle weakness (generalized)  Other abnormalities of gait and mobility  Unsteadiness on feet     Problem  List Patient Active Problem List   Diagnosis Date Noted   Left pontine stroke ([facility]) 07/25/2020   Acute right hemiparesis ([facility]) 07/25/2020   Essential hypertension    History of CVA (cerebrovascular accident)    CKD (chronic kidney disease), stage II    Diabetes mellitus type 2 in obese ([facility])    Dyslipidemia    CVA (cerebral vascular accident) ([facility]) 07/21/2020   Cryptogenic stroke ([facility]) 06/18/2020   Acute-on-chronic kidney injury ([facility]) 05/28/2020   Stroke ([facility]) 05/27/2020   Elevated troponin 01/13/2019   COVID-19 virus infection 01/13/2019   S/P CABG (coronary artery bypass graft)    CAD (coronary artery disease) 04/28/2011   Insulin dependent type 2 diabetes mellitus, controlled ([place])    Hyperlipidemia    Hypertension    Obesity (BMI 30-39.9)    Nephrolithiasis, uric acid     Jones Bales, PT, DPT 11/20/2020, 1:17 PM  [facility] [address] [place], [place], [address] Phone: [phone]   Fax:  [phone]  Name: Mitchell Lambert MRN: [id number] Date of Birth: 1950-03-11

## 2020-11-22 ENCOUNTER — Ambulatory Visit: Payer: Medicare Other

## 2020-11-22 ENCOUNTER — Other Ambulatory Visit: Payer: Self-pay

## 2020-11-22 DIAGNOSIS — R2681 Unsteadiness on feet: Secondary | ICD-10-CM | POA: Diagnosis not present

## 2020-11-22 DIAGNOSIS — G8191 Hemiplegia, unspecified affecting right dominant side: Secondary | ICD-10-CM | POA: Diagnosis not present

## 2020-11-22 DIAGNOSIS — R2689 Other abnormalities of gait and mobility: Secondary | ICD-10-CM

## 2020-11-22 DIAGNOSIS — M6281 Muscle weakness (generalized): Secondary | ICD-10-CM

## 2020-11-22 DIAGNOSIS — R41842 Visuospatial deficit: Secondary | ICD-10-CM | POA: Diagnosis not present

## 2020-11-22 DIAGNOSIS — R278 Other lack of coordination: Secondary | ICD-10-CM | POA: Diagnosis not present

## 2020-11-22 NOTE — Therapy (Signed)
[facility] [address] [place], [place], [address] Phone: [phone]   Fax:  [phone]  Physical Therapy Treatment/Discharge Summary  Patient Details  Name: Mitchell Lambert MRN: [id number] Date of Birth: [birth year] Referring Provider (PT): Garvin Fila, MD  PHYSICAL THERAPY DISCHARGE SUMMARY  Visits from Start of Care: 17  Current functional level related to goals / functional outcomes: See Clinical Impression Statement   Remaining deficits: Mild Imbalance, Low Fall Risk   Education / Equipment: HEP   Patient agrees to discharge. Patient goals were met. Patient is being discharged due to meeting the stated rehab goals.  Encounter Date: 11/22/2020   PT End of Session - 11/22/20 1151     Visit Number 17    Number of Visits 27    Date for PT Re-Evaluation 67/67/20   per re-cert on 11/03/68   Authorization Type Medicare - will need 10th visit PN    Progress Note Due on Visit 20    PT Start Time 1149    PT Stop Time 1223    PT Time Calculation (min) 34 min    Equipment Utilized During Treatment Gait belt    Activity Tolerance Patient tolerated treatment well    Behavior During Therapy WFL for tasks assessed/performed             Past Medical History:  Diagnosis Date   Angina    NO ANGINA SINCE CABG   Arthritis    right ankle   Chronic daily headache    "@ least every other day""improved since heart surgery"   Coronary artery disease    PT [place] CABG SURGERY - DR. TILLEY IS HIS CARDIOLOGIST   Diabetes mellitus    Lantus x 10 yrs   Gout    History of kidney stones 09-08-12   multiple times with some lithotripsies   Hyperlipemia    Hypertension     Past Surgical History:  Procedure Laterality Date   BACK SURGERY     microsurgery: spinal stenosis -lumbar   bone spur  1990's   right great toe; "probably related to gout"   BUBBLE STUDY  06/17/2020   Procedure: BUBBLE  STUDY;  Surgeon: Lelon Perla, MD;  Location: [facility];  Service: Cardiovascular;;   CARDIAC CATHETERIZATION  04/27/11   CATARACT EXTRACTION W/ INTRAOCULAR LENS IMPLANT  1990's   right; "lens was replaced twice over 3 months; lens is actually over iris"   CORONARY ARTERY BYPASS GRAFT  04/30/2011   Procedure: CORONARY ARTERY BYPASS GRAFTING (CABG);  Surgeon: Melrose Nakayama, MD;  Location: [facility];  Service: Open Heart Surgery;  Laterality: N/A;,x4 vessels   CYSTOSCOPY W/ URETERAL STENT PLACEMENT Right 08/19/2012   Procedure: CYSTOSCOPY WITH RETROGRADE PYELOGRAM/URETERAL STENT PLACEMENT;  Surgeon: Molli Hazard, MD;  Location: [facility];  Service: Urology;  Laterality: Right;   CYSTOSCOPY WITH RETROGRADE PYELOGRAM, URETEROSCOPY AND STENT PLACEMENT Left 07/17/2013   Procedure: CYSTOSCOPY, LEFT URETEROSCOPY, BASKET EXTRACTION OF STONE, INSERTION OF LEFT URETERAL STENT, ;  Surgeon: Jorja Loa, MD;  Location: [facility];  Service: Urology;  Laterality: Left;   CYSTOSCOPY/RETROGRADE/URETEROSCOPY Right 09/12/2012   Procedure: CYSTOSCOPY/RETROGRADE/URETEROSCOPY;  Surgeon: Franchot Gallo, MD;  Location: [facility];  Service: Urology;  Laterality: Right;   HOLMIUM LASER APPLICATION Right 9/62/8366   Procedure: HOLMIUM LASER APPLICATION;  Surgeon: Franchot Gallo, MD;  Location: [facility];  Service: Urology;  Laterality: Right;   HOLMIUM LASER APPLICATION Left 2/94/7654   Procedure:  HOLMIUM LASER APPLICATION;  Surgeon: Jorja Loa, MD;  Location: [facility];  Service: Urology;  Laterality: Left;   KIDNEY STONE SURGERY     "probably 3 times"   LEFT HEART CATHETERIZATION WITH CORONARY ANGIOGRAM N/A 04/28/2011   Procedure: LEFT HEART CATHETERIZATION WITH CORONARY ANGIOGRAM;  Surgeon: Jacolyn Reedy, MD;  Location: [facility] CATH LAB;  Service: Cardiovascular;  Laterality: N/A;   LITHOTRIPSY     "many; probably 5 times"   RADIAL ARTERY HARVEST  04/30/2011   Procedure: RADIAL ARTERY HARVEST;   Surgeon: Melrose Nakayama, MD;  Location: [facility];  Service: Open Heart Surgery;  Laterality: Left;   RETINAL DETACHMENT SURGERY  1990's   right eye; "made me have an early cataract"   TEE WITHOUT CARDIOVERSION N/A 06/17/2020   Procedure: TRANSESOPHAGEAL ECHOCARDIOGRAM (TEE);  Surgeon: Lelon Perla, MD;  Location: [facility] ENDOSCOPY;  Service: Cardiovascular;  Laterality: N/A;   VASECTOMY      There were no vitals filed for this visit.   Subjective Assessment - 11/22/20 1151     Subjective Patient reports no new changes. Would still like to d/c today. No falls. Walked in the clinic today with [place].    Patient is accompained by: Family member    Pertinent History achilles tendon tear on L    Limitations Standing;Sitting;House hold activities    How long can you stand comfortably? No issues    How long can you walk comfortably? In house only    Patient Stated Goals Return to their own home, play drums again    Currently in Pain? Yes    Pain Score 3     Pain Location Ankle    Pain Orientation Right    Pain Descriptors / Indicators Sore    Pain Type Acute pain    Pain Onset 1 to 4 weeks ago                [facility] PT Assessment - 11/22/20 0001       Assessment   Medical Diagnosis I63.9 (ICD-10-CM) - Left pontine stroke ([place])    Referring Provider (PT) Garvin Fila, MD      Standardized Balance Assessment   Standardized Balance Assessment Dynamic Gait Index      Dynamic Gait Index   Level Surface Normal    Change in Gait Speed Normal    Gait with Horizontal Head Turns Mild Impairment    Gait with Vertical Head Turns Mild Impairment    Gait and Pivot Turn Normal    Step Over Obstacle Mild Impairment    Step Around Obstacles Normal    Steps Normal    Total Score 21    DGI comment: 21/24               [facility] PT Treatment/Exercise - 11/22/20 0001       Ambulation/Gait   Ambulation/Gait Yes    Ambulation/Gait Assistance 5: Supervision    Ambulation/Gait  Assistance Details Patient ambulating into session with SPC, no RW today.    Assistive device Straight cane;None    Gait Pattern Step-through pattern;Decreased stance time - right;Decreased dorsiflexion - right;Decreased weight shift to right;Right foot flat    Ambulation Surface Level;Indoor    Gait velocity 10.78 seconds = 3.04 ft/sec             Reviewed HEP:  Access Code: [email or web address] URL: [email or web address] Date: 10/25/2020 Prepared by: Baldomero Lamy   Exercises Clamshell - 1 x daily - 7 x weekly -  3 sets - 10 reps Supine Bridge - 1 x daily - 7 x weekly - 3 sets - 10 reps Supine Active Straight Leg Raise - 1 x daily - 7 x weekly - 3 sets - 10 reps Standing Soleus Stretch on Step - 4 x daily - 7 x weekly - 30 sec hold Standing Gastroc Stretch - 1 x daily - 7 x weekly - 1 sets - 3 reps - 30 seconds hold Seated Knee Extension with Resistance - 1 x daily - 7 x weekly - 3 sets - 10 reps Seated Knee Flexion with Anchored Resistance - 1 x daily - 7 x weekly - 3 sets - 10 reps Side Stepping with Resistance at Thighs and Counter Support - 1 x daily - 7 x weekly - 1 sets - 3-4 reps        PT Education - 11/22/20 1154     Education Details progress toward LTGs; HEP review; continued use of SPC and RW for long distance ambulation    Person(s) Educated Patient    Methods Explanation    Comprehension Verbalized understanding              PT Short Term Goals - 11/20/20 1240       PT SHORT TERM GOAL #1   Title Pt will perform 8 steps with SPC and single handrail with supervision in order to demo improved community access. ALL STGS DUE 11/08/20    Baseline 16 steps with SPC, reciprocal stepping and single handrail with supervision    Time 4    Period Weeks    Status Achieved    Target Date 11/08/20      PT SHORT TERM GOAL #2   Title Pt will perform a curb with SPC with quad tip and supervision in order to demo improved community mobility.    Baseline  requires min guard; curb x 3 reps with supervision and SPC    Time 4    Period Weeks    Status Achieved      PT SHORT TERM GOAL #3   Title Pt will decr TUG time with SPC to at least 12 seconds or less to demo decr fall risk.    Baseline 13.59 seconds; 10.97 secs with SPC    Time 4    Period Weeks    Status Achieved      PT SHORT TERM GOAL #4   Title Pt will ambulate at least 250' outdoors with [facility] with quad tip and supervision in order to demo improved community mobility.    Baseline needs min guard, limited distances outdoors due to light sensitivity; 750 outdoors supervision with SPC    Time 4    Period Weeks    Status Achieved               PT Long Term Goals - 11/22/20 1155       PT LONG TERM GOAL #1   Title Pt will be independent with advanced HEP for strengthening and balance ALL LTGS DUE 12/06/20    Baseline did not have time to assess, pt will benefit from ongoing additions/updates; reports independence with HEP    Time 8    Period Weeks    Status Achieved      PT LONG TERM GOAL #2   Title Pt will improve gait speed with cane to at least 2.75 ft/sec in order to demo improved community mobility.    Baseline 2.34 ft/sec; 3.04 ft/sec    Time  8    Period Weeks    Status Achieved      PT LONG TERM GOAL #3   Title Pt will have improved DGI score to at least 19/24 to demo decreased fall risk    Baseline 17/24; 15/24 ON 10/08/20; 21/24    Time 8    Period Weeks    Status Achieved      PT LONG TERM GOAL #4   Title Pt will be able to amb at least 500' with LRAD mod I on level and unlevel surfaces for community amb    Baseline need to use RW for longer outdoor distances, did not have time to do distance outdoors today/too hot; 750 ft with Supervision on outdoors level/unlevel    Time 8    Period Weeks    Status Partially Met      PT LONG TERM GOAL #5   Title Pt will be able to use R foot/ankle for drumming to return to leisure activities    Baseline has been  trying to use his drums at home; returned to drumming at this time, still challenge with more advanced beats    Time 8    Period Weeks    Status Achieved                   Plan - 11/22/20 1155     Clinical Impression Statement Per patient request, finished assesment of progress toward LTG. Patient able to meet or partially meet all LTGs. Patient demonstrating significant improvements with PT services, with ability to ambulate with SPC at this time. Still educated to use RW for long distance ambulation due to endurance component. Conitnued use of AFO due to RLE weakness and promote improved gait pattern. Paitent scored 21/24 on DGI, and improved gait speed to 3.04 ft/sec demonstrating improved balance and community mobility. Reviewed HEP with patient. Patient would like to d/c from PT services at this time, with PT agreeable.    Personal Factors and Comorbidities Age;Fitness;Time since onset of injury/illness/exacerbation;Comorbidity 1;Past/Current Experience    Comorbidities L Achilles tendon tear, chronic R LE edema, CAD s/p CABG, HTN, asthma, type 2 diabetes, CKD stage III, HLD, remote R retinal detachment, HTN, CVA.    Examination-Activity Limitations Squat;Stairs;Caring for Others;Locomotion Level    Examination-Participation Restrictions Cleaning;Community Activity;Driving;Occupation;Shop;Yard Work    [employer] Evolving/Moderate complexity    Rehab Potential Good    PT Frequency 2x / week    PT Duration 8 weeks    PT Treatment/Interventions ADLs/Self Care Home Management;Aquatic Therapy;Cryotherapy;Electrical Stimulation;Moist Heat;DME Instruction;Gait training;Stair training;Functional mobility training;Therapeutic activities;Therapeutic exercise;Balance training;Neuromuscular re-education;Manual techniques;Patient/family education;Passive range of motion;Taping;Joint Manipulations;Vestibular    PT Home Exercise Plan Access Code: [email or web address]; Walking program  (09/17/20)- pt instructions    Consulted and Agree with Plan of Care Patient;Family member/caregiver             Patient will benefit from skilled therapeutic intervention in order to improve the following deficits and impairments:  Abnormal gait, Decreased range of motion, Difficulty walking, Increased fascial restricitons, Impaired tone, Decreased activity tolerance, Decreased balance, Decreased mobility, Decreased strength, Increased edema  Visit Diagnosis: Muscle weakness (generalized)  Other abnormalities of gait and mobility  Unsteadiness on feet     Problem List Patient Active Problem List   Diagnosis Date Noted   Left pontine stroke ([place]) 07/25/2020   Acute right hemiparesis ([place]) 07/25/2020   Essential hypertension    History of CVA (cerebrovascular accident)    CKD (chronic kidney disease), stage  II    Diabetes mellitus type 2 in obese (H[facility])    Dyslipidemia    CVA (cerebral vascular accident) ([place]) 07/21/2020   Cryptogenic stroke ([place]) 06/18/2020   Acute-on-chronic kidney injury ([place]) 05/28/2020   Stroke ([place]) 05/27/2020   Elevated troponin 01/13/2019   COVID-19 virus infection 01/13/2019   S/P CABG (coronary artery bypass graft)    CAD (coronary artery disease) 04/28/2011   Insulin dependent type 2 diabetes mellitus, controlled ([place])    Hyperlipidemia    Hypertension    Obesity (BMI 30-39.9)    Nephrolithiasis, uric acid     Jones Bales, PT, DPT 11/22/2020, 12:32 PM  [facility] [address] [place], [place], [address] Phone: [phone]   Fax:  [phone]  Name: Mitchell Lambert MRN: [id number] Date of Birth: 1950/10/09

## 2020-12-02 ENCOUNTER — Ambulatory Visit (INDEPENDENT_AMBULATORY_CARE_PROVIDER_SITE_OTHER): Payer: Medicare Other

## 2020-12-02 DIAGNOSIS — I639 Cerebral infarction, unspecified: Secondary | ICD-10-CM | POA: Diagnosis not present

## 2020-12-02 LAB — CUP PACEART REMOTE DEVICE CHECK
Date Time Interrogation Session: 20221002090143
Implantable Pulse Generator Implant Date: 20220419

## 2020-12-03 ENCOUNTER — Encounter: Payer: Medicare Other | Admitting: Physical Medicine & Rehabilitation

## 2020-12-09 NOTE — Progress Notes (Signed)
[facility] Summary Report / Loop Recorder 

## 2020-12-24 DIAGNOSIS — H25012 Cortical age-related cataract, left eye: Secondary | ICD-10-CM | POA: Diagnosis not present

## 2020-12-24 DIAGNOSIS — H2512 Age-related nuclear cataract, left eye: Secondary | ICD-10-CM | POA: Diagnosis not present

## 2020-12-24 DIAGNOSIS — H18413 Arcus senilis, bilateral: Secondary | ICD-10-CM | POA: Diagnosis not present

## 2020-12-24 DIAGNOSIS — Z961 Presence of intraocular lens: Secondary | ICD-10-CM | POA: Diagnosis not present

## 2020-12-25 ENCOUNTER — Encounter: Payer: Self-pay | Admitting: Adult Health

## 2020-12-25 ENCOUNTER — Ambulatory Visit (INDEPENDENT_AMBULATORY_CARE_PROVIDER_SITE_OTHER): Payer: Medicare Other | Admitting: Adult Health

## 2020-12-25 VITALS — BP 136/74 | HR 60 | Ht 71.0 in | Wt 221.0 lb

## 2020-12-25 DIAGNOSIS — M25511 Pain in right shoulder: Secondary | ICD-10-CM

## 2020-12-25 DIAGNOSIS — M109 Gout, unspecified: Secondary | ICD-10-CM | POA: Diagnosis not present

## 2020-12-25 DIAGNOSIS — Z23 Encounter for immunization: Secondary | ICD-10-CM | POA: Diagnosis not present

## 2020-12-25 DIAGNOSIS — I69359 Hemiplegia and hemiparesis following cerebral infarction affecting unspecified side: Secondary | ICD-10-CM | POA: Diagnosis not present

## 2020-12-25 DIAGNOSIS — N183 Chronic kidney disease, stage 3 unspecified: Secondary | ICD-10-CM | POA: Diagnosis not present

## 2020-12-25 DIAGNOSIS — F418 Other specified anxiety disorders: Secondary | ICD-10-CM | POA: Diagnosis not present

## 2020-12-25 DIAGNOSIS — G8111 Spastic hemiplegia affecting right dominant side: Secondary | ICD-10-CM

## 2020-12-25 DIAGNOSIS — I639 Cerebral infarction, unspecified: Secondary | ICD-10-CM

## 2020-12-25 DIAGNOSIS — E1169 Type 2 diabetes mellitus with other specified complication: Secondary | ICD-10-CM | POA: Diagnosis not present

## 2020-12-25 MED ORDER — BACLOFEN 5 MG PO TABS: 10.0000 mg | ORAL_TABLET | Freq: Three times a day (TID) | ORAL | 5 refills | Status: DC | PRN

## 2020-12-25 NOTE — Progress Notes (Signed)
[facility] [address] [place] [address] [phone]       OFFICE FOLLOW-UP NOTE  Mitchell Lambert Date of Birth:  Nov 12, 1950 Medical Record Number:  [id number]    Primary neurologist: Dr. Leonie Man Reason for visit: Stroke follow-up   Chief Complaint  Patient presents with   Follow-up    RM 3 with spouse Mitchell Lambert  Pt is well and stable on stroke standpoint, having R shoulder pain       HPI:   Update 12/25/2020 JM: Returns for stroke follow-up after prior visit with Dr. Leonie Man approximately 4 months ago.  Overall stable since prior visit without new stroke/TIA symptoms.  Completed therapies and reports residual mild right sided weakness which has been gradually improving. Greatest concern today is regarding continued and progressive right shoulder pain. No shoulder pain or issues prior to stroke. Worse with movement of shoulder joint or when laying on it.  Interfering with sleep. Use of Flexeril without great benefit. Previously tried low-dose baclofen but without much benefit.  Denies continued diplopia.  Remains on Plavix and atorvastatin without side effects.  Blood pressure today 136/94.  Loop recorder has not shown atrial fibrillation thus far.  Scheduled for evaluation with Dr. Brett Fairy on 11/14 for sleep apnea evaluation He has lost 30 lbs since his stroke - wife questioning if apnea still present. No further snoring but pt still having issues with insomnia and day time fatigue although shoulder pain may be contributing  No further concerns at this time     History provided for reference purposes only Initial visit 08/13/2020 JM: [name] is a 70 year old Caucasian male seen today for initial office visit following hospital admission for stroke and TIA in March and May 2022.  Is accompanied by his wife.  History is obtained from them and review of electronic medical records.  I personally reviewed available imaging films in PACS.  He has past  medical history of diabetes, hypertension, hyperlipidemia, coronary artery disease, arthritis.  He initially presented on 05/27/2020 to [facility] long hospital with sudden onset of aphasia with difficulty seeing on the right side as well.  His blood pressure on initial evaluation was high with systolic being in the 767H but symptoms did not last long.  CT scan showed a left parietal hypodensity which was suggestive of age-indeterminate infarct.  MRI subsequently showed a old left parietal infarct and MRA showed no LVO but stenosis of proximal right P2 segment which was asymptomatic.  LDL cholesterol is 1 1 9  mg percent hemoglobin A1c 8.4.  2D echo showed ejection fraction of 55 to 60% without cardiac source of embolism.  Left atrial size is moderately dilated.  He was diagnosed with a left hemispheric TIA in placed on Plavix.  He had outpatient TEE done on 06/17/2020 which showed no left atrial thrombus or PFO.  He had a loop recorder inserted as an outpatient and so for paroxysmal A. fib has not been found.  He however presented back on 07/21/2020 with sudden onset of dizziness and diplopia with left 6th nerve palsy.  CTA head was unremarkable but MRI scan showed small acute infarcts at the left pontomedullary junction in the left cerebral white matter.  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  Carotid ultrasound showed no extracranial stenosis.  Echocardiogram was not repeated.  LDL cholesterol was 88 mg percent and hemoglobin A1c was 8.5.  Patient was switched from Plavix to aspirin  81 mg and Brilinta 90 mg twice daily for 4 weeks.  Patient was to inpatient rehab and has been discharging home for a week.  He states his gait and balance are improved is walking a lot better.  He still has a right foot drop but wears an ankle brace which helps him walk.  He still has diplopia however and has to close 1 eye.  He had 1 visit with home PT and OT and was not  impressed and prefers that I arrange outpatient therapies for him.  Patient was screened for participation in the sleep smart study during his recent hospitalization and tested positive on the NOx 3 monitor however he was a screen failure as he was unable to tolerate the CPAP mask.  He states he is tolerating Brilinta and aspirin well without bruising or bleeding.  Blood pressures under good control.    ROS:   14 system review of systems is positive for those listed in HPI and all other systems negative  PMH:  Past Medical History:  Diagnosis Date   Angina    NO ANGINA SINCE CABG   Arthritis    right ankle   Chronic daily headache    "@ least every other day""improved since heart surgery"   Coronary artery disease    PT [place] CABG SURGERY - DR. TILLEY IS HIS CARDIOLOGIST   Diabetes mellitus    Lantus x 10 yrs   Gout    History of kidney stones 09-08-12   multiple times with some lithotripsies   Hyperlipemia    Hypertension     Social History:  Social History   Socioeconomic History   Marital status: Married    Spouse name: Mitchell Lambert   Number of children: Not on file   Years of education: Not on file   Highest education level: Not on file  Occupational History   Not on file  Tobacco Use   Smoking status: Never   Smokeless tobacco: Never  Vaping Use   Vaping Use: Never used  Substance and Sexual Activity   Alcohol use: Yes    Comment:  OCCAS BEER  OR MIXED DRINK LAST MARIJUANA COUPLE MONTHS AGO   Drug use: Yes    Types: Marijuana    Comment: last marijuana use ~ 2010; "very rare". since   Sexual activity: Yes  Other Topics Concern   Not on file  Social History Narrative   Lives with wife   Right Handed   Drinks soda every other day   Social Determinants of Health   Financial Resource Strain: Not on file  Food Insecurity: Not on file  Transportation Needs: Not on file  Physical Activity: Not on file  Stress: Not on file  Social  Connections: Not on file  Intimate Partner Violence: Not on file    Medications:   Current Outpatient Medications on File Prior to Visit  Medication Sig Dispense Refill   ACCU-CHEK GUIDE test strip USE TO TEST BLOOD SUGAR 2 TO 3 TIMES A DAY  5   allopurinol (ZYLOPRIM) 300 MG tablet Take 300 mg by mouth daily.     amLODipine (NORVASC) 5 MG tablet TAKE 1 TABLET(5 MG) BY MOUTH DAILY (Patient taking differently: Take 5 mg by mouth daily.) 90 tablet 3   atorvastatin (LIPITOR) 40 MG tablet Take 40 mg by mouth daily.     B-D ULTRAFINE III SHORT PEN 31G X 8 MM MISC USE UTD WITH FLEXPEN.  3   Baclofen  5 MG TABS Take 5 mg by mouth 3 (three) times daily. (Patient taking differently: Take 5 mg by mouth daily as needed.) 90 tablet 0   Biotin 5000 MCG TABS Take 5,000 mcg by mouth daily.      bisoprolol (ZEBETA) 5 MG tablet Take 5 mg by mouth daily.     clopidogrel (PLAVIX) 75 MG tablet Take 1 tablet (75 mg total) by mouth daily. 30 tablet 11   HYDROcodone-acetaminophen (NORCO/VICODIN) 5-325 MG tablet Take 1 tablet by mouth every 12 (twelve) hours as needed for severe pain. 10 tablet 0   insulin aspart (NOVOLOG FLEXPEN) 100 UNIT/ML FlexPen Inject 5 Units into the skin as needed for high blood sugar. If you eat more than 50% of your meal and if blood sugar is >120 15 mL 11   Insulin Glargine (BASAGLAR KWIKPEN) 100 UNIT/ML Inject 70 Units into the skin in the morning. In AM     irbesartan (AVAPRO) 300 MG tablet Take 300 mg by mouth daily.     LORazepam (ATIVAN) 0.5 MG tablet Take 1 tablet (0.5 mg total) by mouth daily as needed for anxiety. 30 tablet    magnesium gluconate (MAGONATE) 500 MG tablet Take 2 tablets (1,000 mg total) by mouth at bedtime. 60 tablet 0   Multiple Vitamins-Minerals (MULTIVITAMIN WITH MINERALS) tablet Take 1 tablet by mouth daily.     nitroGLYCERIN (NITROSTAT) 0.4 MG SL tablet Place 0.4 mg under the tongue as needed for chest pain.     traMADol (ULTRAM) 50 MG tablet Take 50 mg by mouth  as needed.     No current facility-administered medications on file prior to visit.    Allergies:   Allergies  Allergen Reactions   Gabapentin Other (See Comments)    "made me so dizzy I missed work for 2 days"   Metformin And Related Shortness Of Breath   Sulfa Antibiotics Shortness Of Breath and Rash   Azithromycin Other (See Comments)    not good for heart condition   Ciprofloxacin Other (See Comments)    tendon rupture   Lisinopril Cough   Losartan Potassium Other (See Comments)    hypotension   Olmesartan Cough   Simvastatin Cough   Amoxicillin Rash and Other (See Comments)    rash Did it involve swelling of the face/tongue/throat, SOB, or low BP? N Did it involve sudden or severe rash/hives, skin peeling, or any reaction on the inside of your mouth or nose?  Y Did you need to seek medical attention at a hospital or doctor's office? N When did it last happen?  "years and years ago"     If all above answers are "NO", may proceed with cephalosporin use.    Doxycycline Rash   Penicillins Rash    Physical Exam Today's Vitals   12/25/20 1238  BP: 136/74  Pulse: 60  Weight: 221 lb (100.2 kg)  Height: 5\' 11"  (1.803 m)   Body mass index is 30.82 kg/m.   General: Mildly obese elderly Caucasian male d, seated, in no evident distress Head: head normocephalic and atraumatic.  Neck: supple with no carotid or supraclavicular bruits Cardiovascular: regular rate and rhythm, no murmurs Musculoskeletal: no deformity except mild right foot drop Skin:  no rash/petichiae Vascular:  Normal pulses all extremities  Neurologic Exam Mental Status: Awake and fully alert.  Fluent speech and language.  Oriented to place and time. Recent and remote memory intact. Attention span, concentration and fund of knowledge appropriate. Mood and affect appropriate.  Cranial  Nerves: Pupils equal, briskly reactive to light. Extraocular movements full without nystagmus.  Visual fields full to  confrontation. Hearing intact. Facial sensation intact. Face, tongue, palate moves normally and symmetrically.  Motor: Normal bulk and tone. Normal strength in all tested extremity muscles except mild weakness of right grip and intrinsic hand muscles orbits left over right upper extremity.  Limited right shoulder ROM. Mild right ankle dorsiflexor weakness with foot drop -current use of AFO Sensory.: intact to touch ,pinprick .position and vibratory sensation.  Coordination: Rapid alternating movements normal in all extremities except slight decreased right hand dexterity. Finger-to-nose and heel-to-shin performed accurately on left side. Gait and Station: Arises from chair without difficulty. Stance is normal. Gait demonstrates slightly stiffened right leg without evidence of imbalance or unsteadiness and use of cane.  Tandem walk and heel toe not attempted reflexes: 2+ RUE and RLE; 1+ LUE and LLE. Toes downgoing.      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  Vascular risk factors of hypertension, hyperlipidemia, coronary artery disease, sleep apnea and diabetes.    PLAN:  -restart baclofen 5-10mg  TID PRN for spasticity and hopeful benefit of right shoulder pain -referred to orthopedics - wife concerned underlying issue that would contraindicate restarting PT as this was not addressed during recent therapy sessions  -Continue Plavix 75 mg daily and atorvastatin 40 mg daily for secondary stroke prevention and  -Close follow-up with PCP to maintain strict control of hypertension with blood pressure goal below 130/90, diabetes with hemoglobin A1c goal below 7.0 % and lipids with LDL cholesterol goal below 70 mg/dL. -ensure follow up with Dr. Brett Fairy as scheduled on 01/13/2021 for  further sleep apnea evaluation -Loop recorder has not shown atrial fibrillation thus far - routine monitoring by cardiology    Follow-up in 6 months or call earlier if needed    CC:  Lawerance Cruel, MD   I spent 36 minutes of face-to-face and non-face-to-face time with patient and wife.  This included previsit chart review, lab review, study review, order entry, electronic health record documentation, patient and wife education and discussion regarding history of prior strokes, residual deficits including shoulder pain and possible etiologies and further eval with orthopedics, secondary stroke prevention measures and aggressive stroke risk factor management and answered all other questions to patient and wifes satisfaction  Frann Rider, AGNP-BC  [facility] [address] [place], [place] [address]  Phone [phone] Fax [phone] Note: This document was prepared with digital dictation and possible smart phrase technology. Any transcriptional errors that result from this process are unintentional.

## 2020-12-25 NOTE — Patient Instructions (Signed)
Restart baclofen 5-10mg  three times daily as needed  Referral placed to ortho   Continue clopidogrel 75 mg daily  and atorvastatin  for secondary stroke prevention  Continue to follow up with PCP regarding cholesterol, blood pressure and diabetes management  Maintain strict control of hypertension with blood pressure goal below 130/90, diabetes with hemoglobin A1c goal below 7% and cholesterol with LDL cholesterol (bad cholesterol) goal below 70 mg/dL.       Followup in the future with me in 6 months or call earlier if needed       Thank you for coming to see [place] at [facility]. I hope we have been able to provide you high quality care today.  You may receive a patient satisfaction survey over the next few weeks. We would appreciate your feedback and comments so that we may continue to improve ourselves and the health of our patients.

## 2020-12-26 ENCOUNTER — Telehealth: Payer: Self-pay | Admitting: *Deleted

## 2020-12-26 DIAGNOSIS — M25511 Pain in right shoulder: Secondary | ICD-10-CM

## 2020-12-26 DIAGNOSIS — G8111 Spastic hemiplegia affecting right dominant side: Secondary | ICD-10-CM

## 2020-12-26 NOTE — Telephone Encounter (Signed)
Called [employer], spoke with Lorenso Courier who stated PA for [facility] handled by different team. She transferred call [phone]). Opt 3, call was ended after holding 20 minutes. Tarri Glenn , spoke [place] who stated she is with [employer], has to transfer me to [facility] p [phone]. Spoke with Ulice Dash  and after 25 minutes he stated he cannot hlep me without verbal permission form the patient. He stated he cannot get patient on line. He advised I get verbal permission in order for him to begin PA. I confirmed and hung up.

## 2020-12-26 NOTE — Telephone Encounter (Signed)
Called [employer] PA, dept for Baclofen PA, it is  closed. Will call back later.

## 2020-12-26 NOTE — Telephone Encounter (Signed)
Form received completed and signed. Faxed back to [phone], confirmation received.

## 2020-12-26 NOTE — Telephone Encounter (Signed)
Prited [employer] PA form, filled out, placed on NP's desk for review, signature.

## 2020-12-27 NOTE — Telephone Encounter (Signed)
Received fax from [facility] re: this is a pharmacy request. It has been sent to the pharmacy dept for a review under pharmacy benefit. A separate notice will be provided.

## 2020-12-30 MED ORDER — BACLOFEN 10 MG PO TABS
10.0000 mg | ORAL_TABLET | Freq: Three times a day (TID) | ORAL | 2 refills | Status: AC | PRN
Start: 2020-12-30 — End: ?

## 2020-12-30 NOTE — Telephone Encounter (Signed)
Baclofen 5 mg denied. Covered drugs:  Baclofen oral tab 10 mg, 20 mg Dantrolene Na oral cap 25 mg, 50 mg, 100 mg Tizanidine Hcl oral tab 2 mg, 4 mg  Sent to NP.

## 2020-12-30 NOTE — Telephone Encounter (Signed)
Called patient, wife answered, patient was unavailable. Wife, on DPR so I informed her Janett Billow sent in new Rx for baclofen due to insurance. He may take 1/2 of 10 mg tablet if he feels he doesn't need 10 mg. She then asked about his referral to ortho, I advised if they haven't heard form ortho in about a week to call them. She  verbalized understanding, appreciation.

## 2020-12-30 NOTE — Addendum Note (Signed)
Addended by: Florian Buff C on: 12/30/2020 01:14 PM   Modules accepted: Orders

## 2020-12-30 NOTE — Telephone Encounter (Signed)
Yes that is fine. Thank you. Please just let pt know about change - he can break in half if he only wants 5 mg dose. Thank you

## 2021-01-05 LAB — CUP PACEART REMOTE DEVICE CHECK
Date Time Interrogation Session: 20221104090149
Implantable Pulse Generator Implant Date: 20220419

## 2021-01-06 ENCOUNTER — Ambulatory Visit (INDEPENDENT_AMBULATORY_CARE_PROVIDER_SITE_OTHER): Payer: Medicare Other

## 2021-01-06 DIAGNOSIS — I639 Cerebral infarction, unspecified: Secondary | ICD-10-CM

## 2021-01-09 NOTE — Progress Notes (Signed)
[facility] Summary Report / Loop Recorder 

## 2021-01-13 ENCOUNTER — Encounter: Payer: Self-pay | Admitting: Neurology

## 2021-01-13 ENCOUNTER — Institutional Professional Consult (permissible substitution): Payer: Medicare Other | Admitting: Neurology

## 2021-01-20 ENCOUNTER — Telehealth: Payer: Self-pay

## 2021-01-20 NOTE — Telephone Encounter (Signed)
Received Surgical Clearance form from [facility]. Placed on Janett Billow, NP desk for review and signature. Per request meds do not need to be stopped since topical anesthesia/IV medication will be used.

## 2021-01-27 NOTE — Telephone Encounter (Signed)
Signed and placed in outbox.  Thank you. ?

## 2021-01-28 NOTE — Telephone Encounter (Signed)
Faxed back to theresa gilmore at [phone], confirmation received

## 2021-02-03 DIAGNOSIS — H25013 Cortical age-related cataract, bilateral: Secondary | ICD-10-CM | POA: Diagnosis not present

## 2021-02-03 DIAGNOSIS — H2512 Age-related nuclear cataract, left eye: Secondary | ICD-10-CM | POA: Diagnosis not present

## 2021-02-05 LAB — CUP PACEART REMOTE DEVICE CHECK
Date Time Interrogation Session: 20221207090020
Implantable Pulse Generator Implant Date: 20220419

## 2021-02-10 ENCOUNTER — Ambulatory Visit (INDEPENDENT_AMBULATORY_CARE_PROVIDER_SITE_OTHER): Payer: Medicare Other

## 2021-02-10 DIAGNOSIS — I639 Cerebral infarction, unspecified: Secondary | ICD-10-CM

## 2021-02-17 NOTE — Progress Notes (Signed)
[facility] Summary Report / Loop Recorder 

## 2021-03-04 DIAGNOSIS — Z20822 Contact with and (suspected) exposure to covid-19: Secondary | ICD-10-CM | POA: Diagnosis not present

## 2021-03-05 DIAGNOSIS — H40002 Preglaucoma, unspecified, left eye: Secondary | ICD-10-CM | POA: Diagnosis not present

## 2021-03-12 DIAGNOSIS — R42 Dizziness and giddiness: Secondary | ICD-10-CM | POA: Diagnosis not present

## 2021-03-17 ENCOUNTER — Ambulatory Visit (INDEPENDENT_AMBULATORY_CARE_PROVIDER_SITE_OTHER): Payer: Medicare Other

## 2021-03-17 DIAGNOSIS — I6300 Cerebral infarction due to thrombosis of unspecified precerebral artery: Secondary | ICD-10-CM

## 2021-03-17 LAB — CUP PACEART REMOTE DEVICE CHECK
Date Time Interrogation Session: 20230115230155
Implantable Pulse Generator Implant Date: 20220419

## 2021-03-26 NOTE — Progress Notes (Signed)
[facility] Summary Report / Loop Recorder 

## 2021-03-31 ENCOUNTER — Ambulatory Visit: Payer: Medicare Other | Admitting: Adult Health

## 2021-04-02 DIAGNOSIS — H8110 Benign paroxysmal vertigo, unspecified ear: Secondary | ICD-10-CM | POA: Insufficient documentation

## 2021-04-09 DIAGNOSIS — H40002 Preglaucoma, unspecified, left eye: Secondary | ICD-10-CM | POA: Diagnosis not present

## 2021-04-19 LAB — CUP PACEART REMOTE DEVICE CHECK
Date Time Interrogation Session: 20230217230114
Implantable Pulse Generator Implant Date: 20220419

## 2021-04-21 ENCOUNTER — Ambulatory Visit (INDEPENDENT_AMBULATORY_CARE_PROVIDER_SITE_OTHER): Payer: Medicare Other

## 2021-04-21 DIAGNOSIS — I6300 Cerebral infarction due to thrombosis of unspecified precerebral artery: Secondary | ICD-10-CM

## 2021-04-24 DIAGNOSIS — H40002 Preglaucoma, unspecified, left eye: Secondary | ICD-10-CM | POA: Diagnosis not present

## 2021-04-24 NOTE — Progress Notes (Signed)
[facility] Summary Report / Loop Recorder 

## 2021-05-19 DIAGNOSIS — Z20822 Contact with and (suspected) exposure to covid-19: Secondary | ICD-10-CM | POA: Diagnosis not present

## 2021-05-22 DIAGNOSIS — H6123 Impacted cerumen, bilateral: Secondary | ICD-10-CM | POA: Diagnosis not present

## 2021-05-22 DIAGNOSIS — M109 Gout, unspecified: Secondary | ICD-10-CM | POA: Diagnosis not present

## 2021-05-22 DIAGNOSIS — R42 Dizziness and giddiness: Secondary | ICD-10-CM | POA: Diagnosis not present

## 2021-05-22 DIAGNOSIS — M79602 Pain in left arm: Secondary | ICD-10-CM | POA: Diagnosis not present

## 2021-05-22 DIAGNOSIS — F419 Anxiety disorder, unspecified: Secondary | ICD-10-CM | POA: Diagnosis not present

## 2021-05-26 ENCOUNTER — Ambulatory Visit (INDEPENDENT_AMBULATORY_CARE_PROVIDER_SITE_OTHER): Payer: Medicare Other

## 2021-05-26 DIAGNOSIS — I6300 Cerebral infarction due to thrombosis of unspecified precerebral artery: Secondary | ICD-10-CM

## 2021-05-27 DIAGNOSIS — H40002 Preglaucoma, unspecified, left eye: Secondary | ICD-10-CM | POA: Diagnosis not present

## 2021-05-27 LAB — CUP PACEART REMOTE DEVICE CHECK
Date Time Interrogation Session: 20230326230555
Implantable Pulse Generator Implant Date: 20220419

## 2021-05-30 DIAGNOSIS — Z125 Encounter for screening for malignant neoplasm of prostate: Secondary | ICD-10-CM | POA: Diagnosis not present

## 2021-05-30 DIAGNOSIS — I10 Essential (primary) hypertension: Secondary | ICD-10-CM | POA: Diagnosis not present

## 2021-05-30 DIAGNOSIS — E1169 Type 2 diabetes mellitus with other specified complication: Secondary | ICD-10-CM | POA: Diagnosis not present

## 2021-05-30 DIAGNOSIS — E7800 Pure hypercholesterolemia, unspecified: Secondary | ICD-10-CM | POA: Diagnosis not present

## 2021-06-04 DIAGNOSIS — I10 Essential (primary) hypertension: Secondary | ICD-10-CM | POA: Diagnosis not present

## 2021-06-04 DIAGNOSIS — E1169 Type 2 diabetes mellitus with other specified complication: Secondary | ICD-10-CM | POA: Diagnosis not present

## 2021-06-04 DIAGNOSIS — I69359 Hemiplegia and hemiparesis following cerebral infarction affecting unspecified side: Secondary | ICD-10-CM | POA: Diagnosis not present

## 2021-06-04 DIAGNOSIS — F419 Anxiety disorder, unspecified: Secondary | ICD-10-CM | POA: Diagnosis not present

## 2021-06-04 DIAGNOSIS — M109 Gout, unspecified: Secondary | ICD-10-CM | POA: Diagnosis not present

## 2021-06-04 DIAGNOSIS — G4700 Insomnia, unspecified: Secondary | ICD-10-CM | POA: Diagnosis not present

## 2021-06-04 DIAGNOSIS — R972 Elevated prostate specific antigen [PSA]: Secondary | ICD-10-CM | POA: Diagnosis not present

## 2021-06-04 DIAGNOSIS — Z0000 Encounter for general adult medical examination without abnormal findings: Secondary | ICD-10-CM | POA: Diagnosis not present

## 2021-06-04 DIAGNOSIS — E7800 Pure hypercholesterolemia, unspecified: Secondary | ICD-10-CM | POA: Diagnosis not present

## 2021-06-04 DIAGNOSIS — I679 Cerebrovascular disease, unspecified: Secondary | ICD-10-CM | POA: Diagnosis not present

## 2021-06-04 DIAGNOSIS — K219 Gastro-esophageal reflux disease without esophagitis: Secondary | ICD-10-CM | POA: Diagnosis not present

## 2021-06-04 DIAGNOSIS — Z125 Encounter for screening for malignant neoplasm of prostate: Secondary | ICD-10-CM | POA: Diagnosis not present

## 2021-06-04 NOTE — Progress Notes (Signed)
[facility] Summary Report / Loop Recorder 

## 2021-06-17 ENCOUNTER — Telehealth: Payer: Self-pay | Admitting: Neurology

## 2021-06-17 ENCOUNTER — Ambulatory Visit (INDEPENDENT_AMBULATORY_CARE_PROVIDER_SITE_OTHER): Payer: Medicare Other | Admitting: Neurology

## 2021-06-17 ENCOUNTER — Encounter: Payer: Self-pay | Admitting: Neurology

## 2021-06-17 VITALS — BP 152/78 | HR 64 | Ht 71.0 in | Wt 223.0 lb

## 2021-06-17 DIAGNOSIS — I6300 Cerebral infarction due to thrombosis of unspecified precerebral artery: Secondary | ICD-10-CM

## 2021-06-17 DIAGNOSIS — Z8673 Personal history of transient ischemic attack (TIA), and cerebral infarction without residual deficits: Secondary | ICD-10-CM | POA: Diagnosis not present

## 2021-06-17 DIAGNOSIS — H8111 Benign paroxysmal vertigo, right ear: Secondary | ICD-10-CM

## 2021-06-17 DIAGNOSIS — R42 Dizziness and giddiness: Secondary | ICD-10-CM | POA: Diagnosis not present

## 2021-06-17 NOTE — Patient Instructions (Signed)
I had a long discussion with the patient and his wife regarding his new onset vertigo which likely appears to be benign paroxysmal positional vertigo and fortunately seems to be improving.  I recommend referral to physical therapy for vestibular rehab and dizziness exercises.  Check MRI scan of the brain with and without contrast to rule out any structural lesion and new brainstem infarct.  Continue Plavix for stroke prevention and maintain aggressive risk factor modification strict control of hypertension with blood pressure goal below 130/90, lipids with LDL cholesterol goal below 70 mg percent and diabetes with hemoglobin A1c goal below 6.5%.  He is also encouraged to eat a healthy diet with lots of fruits, vegetables, cereals and whole grains and to be active and exercise regularly and lose weight.  He will return for follow-up in the future in 6 months or call earlier if necessary. ?Benign Positional Vertigo ?Vertigo is the feeling that you or your surroundings are moving when they are not. Benign positional vertigo is the most common form of vertigo. This is usually a harmless condition (benign). This condition is positional. This means that symptoms are triggered by certain movements and positions. ?This condition can be dangerous if it occurs while you are doing something that could cause harm to yourself or others. This includes activities such as driving or operating machinery. ?What are the causes? ?The inner ear has fluid-filled canals that help your brain sense movement and balance. When the fluid moves, the brain receives messages about your body's position. ?With benign positional vertigo, calcium crystals in the inner ear break free and disturb the inner ear area. This causes your brain to receive confusing messages about your body's position. ?What increases the risk? ?You are more likely to develop this condition if: ?You are a woman. ?You are 30 years of age or older. ?You have recently had a head  injury. ?You have an inner ear disease. ?What are the signs or symptoms? ?Symptoms of this condition usually happen when you move your head or your eyes in different directions. Symptoms may start suddenly and usually last for less than a minute. They include: ?Loss of balance and falling. ?Feeling like you are spinning or moving. ?Feeling like your surroundings are spinning or moving. ?Nausea and vomiting. ?Blurred vision. ?Dizziness. ?Involuntary eye movement (nystagmus). ?Symptoms can be mild and cause only minor problems, or they can be severe and interfere with daily life. Episodes of benign positional vertigo may return (recur) over time. Symptoms may also improve over time. ?How is this diagnosed? ?This condition may be diagnosed based on: ?Your medical history. ?A physical exam of the head, neck, and ears. ?Positional tests to check for or stimulate vertigo. You may be asked to turn your head and change positions, such as going from sitting to lying down. A health care provider will watch for symptoms of vertigo. ?You may be referred to a health care provider who specializes in ear, nose, and throat problems (ENT or otolaryngologist) or a provider who specializes in disorders of the nervous system (neurologist). ?How is this treated? ? ?This condition may be treated in a session in which your health care provider moves your head in specific positions to help the displaced crystals in your inner ear move. Treatment for this condition may take several sessions. Surgery may be needed in severe cases, but this is rare. ?In some cases, benign positional vertigo may resolve on its own in 2-4 weeks. ?Follow these instructions at home: ?Safety ?Move slowly. Avoid  sudden body or head movements or certain positions, as told by your health care provider. ?Avoid driving or operating machinery until your health care provider says it is safe. ?Avoid doing any tasks that would be dangerous to you or others if vertigo  occurs. ?If you have trouble walking or keeping your balance, try using a cane for stability. If you feel dizzy or unstable, sit down right away. ?Return to your normal activities as told by your health care provider. Ask your health care provider what activities are safe for you. ?General instructions ?Take over-the-counter and prescription medicines only as told by your health care provider. ?Drink enough fluid to keep your urine pale yellow. ?Keep all follow-up visits. This is important. ?Contact a health care provider if: ?You have a fever. ?Your condition gets worse or you develop new symptoms. ?Your family or friends notice any behavioral changes. ?You have nausea or vomiting that gets worse. ?You have numbness or a prickling and tingling sensation. ?Get help right away if you: ?Have difficulty speaking or moving. ?Are always dizzy or faint. ?Develop severe headaches. ?Have weakness in your legs or arms. ?Have changes in your hearing or vision. ?Develop a stiff neck. ?Develop sensitivity to light. ?These symptoms may represent a serious problem that is an emergency. Do not wait to see if the symptoms will go away. Get medical help right away. Call your local emergency services (911 in the [place] Do not drive yourself to the hospital. ?Summary ?Vertigo is the feeling that you or your surroundings are moving when they are not. Benign positional vertigo is the most common form of vertigo. ?This condition is caused by calcium crystals in the inner ear that become displaced. This causes a disturbance in an area of the inner ear that helps your brain sense movement and balance. ?Symptoms include loss of balance and falling, feeling that you or your surroundings are moving, nausea and vomiting, and blurred vision. ?This condition can be diagnosed based on symptoms, a physical exam, and positional tests. ?Follow safety instructions as told by your health care provider and keep all follow-up visits. This is  important. ?This information is not intended to replace advice given to you by your health care provider. Make sure you discuss any questions you have with your health care provider. ?Document Revised: 01/17/2020 Document Reviewed: 01/17/2020 ?Elsevier Patient Education ? [place]. ? ?

## 2021-06-17 NOTE — Progress Notes (Signed)
?[facility] ?[address] ?[place] [address] ?[phone] ? ?     OFFICE FOLLOW-UP NOTE ? ?[name] ?Date of Birth:  Jul 16, 1950 ?Medical Record Number:  [id number]  ? ?HPI: Office visit 12/25/2020 [name] is a 71 year old Caucasian male seen today for initial office visit following hospital admission for stroke and TIA in March and May 2022.  Is accompanied by his wife.  History is obtained from them and review of electronic medical records.  I personally reviewed available imaging films in PACS.  He has past medical history of diabetes, hypertension, hyperlipidemia, coronary artery disease, arthritis.  He initially presented on 05/27/2020 to [facility] long hospital with sudden onset of aphasia with difficulty seeing on the right side as well.  His blood pressure on initial evaluation was high with systolic being in the 496P but symptoms did not last long.  CT scan showed a left parietal hypodensity which was suggestive of age-indeterminate infarct.  MRI subsequently showed a old left parietal infarct and MRA showed no LVO but stenosis of proximal right P2 segment which was asymptomatic.  LDL cholesterol is '1 1 9 '$ mg percent hemoglobin A1c 8.4.  2D echo showed ejection fraction of 55 to 60% without cardiac source of embolism.  Left atrial size is moderately dilated.  He was diagnosed with a left hemispheric TIA in placed on Plavix.  He had outpatient TEE done on 06/17/2020 which showed no left atrial thrombus or PFO.  He had a loop recorder inserted as an outpatient and so for paroxysmal A. fib has not been found.  He however presented back on 07/21/2020 with sudden onset of dizziness and diplopia with left 6th nerve palsy.  CTA head was unremarkable but MRI scan showed small acute infarcts at the left pontomedullary junction in the left cerebral white matter.  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  Carotid  ultrasound showed no extracranial stenosis.  Echocardiogram was not repeated.  LDL cholesterol was 88 mg percent and hemoglobin A1c was 8.5.  Patient was switched from Plavix to aspirin 81 mg and Brilinta 90 mg twice daily for 4 weeks.  Patient was to inpatient rehab and has been discharging home for a week.  He states his gait and balance are improved is walking a lot better.  He still has a right foot drop but wears an ankle brace which helps him walk.  He still has diplopia however and has to close 1 eye.  He had 1 visit with home PT and OT and was not impressed and prefers that I arrange outpatient therapies for him.  Patient was screened for participation in the sleep smart study during his recent hospitalization and tested positive on the NOx 3 monitor however he was a screen failure as he was unable to tolerate the CPAP mask.  He states he is tolerating Brilinta and aspirin well without bruising or bleeding.  Blood pressures under good control. ?Update 06/17/2021 : He returns for follow-up after last visit 4 months ago.  Is accompanied by his wife.  He states 2 and half months ago he woke up 1 day with sudden onset of vertigo, room spinning, eyes rolling around.  Severe nausea and vomited.  He was miserable for several days and gradually started improving.  He reported mostly vertigo and with change of position getting up out of bed or turning sides.  This seems to have gradually improved.  He  complains of easy fatigability and tiredness when that happens he is still dragging his right foot.  His right shoulder remains painful and stiff and does not move as much as the left side.  He denies any tinnitus, ringing in the ear, head and ear trauma.  He denies any other strokelike symptoms.  He remains on Plavix which is tolerating well with minor bruising and no bleeding.  His blood sugars have been doing good and last hemoglobin A1c is down to 6.7.  Is tolerating Lipitor well without side effects and last lipid  profile was satisfactory.  Blood pressure is normally well at home though today it is slightly elevated in office at 152/78.  He has a loop recorder and so far paroxysmal A-fib has not yet been found.    ?ROS:   ?14 system review of systems is positive for vertigo, nausea, dizziness, weakness, dizziness, gait imbalance and all other systems negative ? ?PMH:  ?Past Medical History:  ?Diagnosis Date  ? Angina   ? NO ANGINA SINCE CABG  ? Arthritis   ? right ankle  ? Chronic daily headache   ? "@ least every other day""improved since heart surgery"  ? Coronary artery disease   ? PT STATES HEART DOING WELL SINCE CABG SURGERY - DR. TILLEY IS HIS CARDIOLOGIST  ? Diabetes mellitus   ? Lantus x 10 yrs  ? Gout   ? History of kidney stones 09-08-12  ? multiple times with some lithotripsies  ? Hyperlipemia   ? Hypertension   ? ? ?Social History:  ?Social History  ? ?Socioeconomic History  ? Marital status: Married  ?  Spouse name: [name]  ? Number of children: Not on file  ? Years of education: Not on file  ? Highest education level: Not on file  ?Occupational History  ? Not on file  ?Tobacco Use  ? Smoking status: Never  ? Smokeless tobacco: Never  ?Vaping Use  ? Vaping Use: Never used  ?Substance and Sexual Activity  ? Alcohol use: Yes  ?  Comment:  OCCAS BEER  OR MIXED DRINK LAST MARIJUANA COUPLE MONTHS AGO  ? Drug use: Yes  ?  Types: Marijuana  ?  Comment: last marijuana use ~ 2010; "very rare". since  ? Sexual activity: Yes  ?Other Topics Concern  ? Not on file  ?Social History Narrative  ? Lives with wife  ? Right Handed  ? Drinks soda every other day  ? ?Social Determinants of Health  ? ?Financial Resource Strain: Not on file  ?Food Insecurity: Not on file  ?Transportation Needs: Not on file  ?Physical Activity: Not on file  ?Stress: Not on file  ?Social Connections: Not on file  ?Intimate Partner Violence: Not on file  ? ? ?Medications:   ?Current Outpatient Medications on File Prior to Visit  ?Medication Sig Dispense  Refill  ? ACCU-CHEK GUIDE test strip USE TO TEST BLOOD SUGAR 2 TO 3 TIMES A DAY  5  ? allopurinol (ZYLOPRIM) 300 MG tablet Take 300 mg by mouth daily.    ? amLODipine (NORVASC) 5 MG tablet TAKE 1 TABLET(5 MG) BY MOUTH DAILY (Patient taking differently: Take 5 mg by mouth daily.) 90 tablet 3  ? atorvastatin (LIPITOR) 40 MG tablet Take 40 mg by mouth daily.    ? B-D ULTRAFINE III SHORT PEN 31G X 8 MM MISC USE UTD WITH FLEXPEN.  3  ? baclofen (LIORESAL) 10 MG tablet Take 1 tablet (10 mg total) by mouth 3 (  three) times daily as needed for muscle spasms. 90 each 2  ? bisoprolol (ZEBETA) 5 MG tablet Take 5 mg by mouth daily.    ? clopidogrel (PLAVIX) 75 MG tablet Take 1 tablet (75 mg total) by mouth daily. 30 tablet 11  ? insulin aspart (NOVOLOG FLEXPEN) 100 UNIT/ML FlexPen Inject 5 Units into the skin as needed for high blood sugar. If you eat more than 50% of your meal and if blood sugar is >120 15 mL 11  ? Insulin Glargine (BASAGLAR KWIKPEN) 100 UNIT/ML Inject 70 Units into the skin in the morning. In AM    ? LORazepam (ATIVAN) 0.5 MG tablet Take 1 tablet (0.5 mg total) by mouth daily as needed for anxiety. 30 tablet   ? magnesium gluconate (MAGONATE) 500 MG tablet Take 2 tablets (1,000 mg total) by mouth at bedtime. 60 tablet 0  ? Multiple Vitamins-Minerals (MULTIVITAMIN WITH MINERALS) tablet Take 1 tablet by mouth daily.    ? nitroGLYCERIN (NITROSTAT) 0.4 MG SL tablet Place 0.4 mg under the tongue as needed for chest pain.    ? traMADol (ULTRAM) 50 MG tablet Take 50 mg by mouth as needed.    ? ?No current facility-administered medications on file prior to visit.  ? ? ?Allergies:   ?Allergies  ?Allergen Reactions  ? Gabapentin Other (See Comments)  ?  "made me so dizzy I missed work for 2 days"  ? Metformin And Related Shortness Of Breath  ? Sulfa Antibiotics Shortness Of Breath and Rash  ? Azithromycin Other (See Comments)  ?  not good for heart condition  ? Ciprofloxacin Other (See Comments)  ?  tendon rupture  ?  Lisinopril Cough  ? Losartan Potassium Other (See Comments)  ?  hypotension  ? Olmesartan Cough  ? Simvastatin Cough  ? Amoxicillin Rash and Other (See Comments)  ?  rash ?Did it involve swelling of the face/ton

## 2021-06-17 NOTE — Telephone Encounter (Signed)
Referral for Physical Therapy sent to [facility] [phone]. ?

## 2021-06-18 ENCOUNTER — Telehealth: Payer: Self-pay | Admitting: Neurology

## 2021-06-18 ENCOUNTER — Ambulatory Visit: Payer: Medicare Other | Attending: Neurology

## 2021-06-18 DIAGNOSIS — R42 Dizziness and giddiness: Secondary | ICD-10-CM | POA: Diagnosis not present

## 2021-06-18 DIAGNOSIS — H8111 Benign paroxysmal vertigo, right ear: Secondary | ICD-10-CM | POA: Diagnosis not present

## 2021-06-18 NOTE — Telephone Encounter (Signed)
Medicare/aarp order sent to GI, NPR they will reach out to the patient to schedule.  °

## 2021-06-18 NOTE — Therapy (Signed)
?OUTPATIENT PHYSICAL THERAPY VESTIBULAR EVALUATION ? ? ? ? ?Patient Name: Mitchell Lambert ?MRN: [id number] ?DOB:Feb 23, 1951, 71 y.o., male ?Today's Date: 06/18/2021 ? ?PCP: Lawerance Cruel, MD ?REFERRING PROVIDER: Garvin Fila, MD ? ? PT End of Session - 06/18/21 1223   ? ? Visit Number 1   ? Number of Visits 9   ? Date for PT Re-Evaluation 08/15/21   ? Authorization Type Medicare A+B/AARP   ? Progress Note Due on Visit 10   ? PT Start Time 1230   ? PT Stop Time 1310   ? PT Time Calculation (min) 40 min   ? Activity Tolerance Patient tolerated treatment well   ? Behavior During Therapy [facility] for tasks assessed/performed   ? ?  ?  ? ?  ? ? ?Past Medical History:  ?Diagnosis Date  ? Angina   ? NO ANGINA SINCE CABG  ? Arthritis   ? right ankle  ? Chronic daily headache   ? "@ least every other day""improved since heart surgery"  ? Coronary artery disease   ? PT STATES HEART DOING WELL SINCE CABG SURGERY - DR. TILLEY IS HIS CARDIOLOGIST  ? Diabetes mellitus   ? Lantus x 10 yrs  ? Gout   ? History of kidney stones 09-08-12  ? multiple times with some lithotripsies  ? Hyperlipemia   ? Hypertension   ? ?Past Surgical History:  ?Procedure Laterality Date  ? BACK SURGERY    ? microsurgery: spinal stenosis -lumbar  ? bone spur  1990's  ? right great toe; "probably related to gout"  ? BUBBLE STUDY  06/17/2020  ? Procedure: BUBBLE STUDY;  Surgeon: Lelon Perla, MD;  Location: [facility];  Service: Cardiovascular;;  ? CARDIAC CATHETERIZATION  04/27/11  ? CATARACT EXTRACTION W/ INTRAOCULAR LENS IMPLANT  1990's  ? right; "lens was replaced twice over 3 months; lens is actually over iris"  ? CORONARY ARTERY BYPASS GRAFT  04/30/2011  ? Procedure: CORONARY ARTERY BYPASS GRAFTING (CABG);  Surgeon: Melrose Nakayama, MD;  Location: [facility];  Service: Open Heart Surgery;  Laterality: N/A;,x4 vessels  ? CYSTOSCOPY W/ URETERAL STENT PLACEMENT Right 08/19/2012  ? Procedure: CYSTOSCOPY WITH RETROGRADE PYELOGRAM/URETERAL STENT PLACEMENT;   Surgeon: Molli Hazard, MD;  Location: [facility];  Service: Urology;  Laterality: Right;  ? CYSTOSCOPY WITH RETROGRADE PYELOGRAM, URETEROSCOPY AND STENT PLACEMENT Left 07/17/2013  ? Procedure: CYSTOSCOPY, LEFT URETEROSCOPY, BASKET EXTRACTION OF STONE, INSERTION OF LEFT URETERAL STENT, ;  Surgeon: Jorja Loa, MD;  Location: [facility];  Service: Urology;  Laterality: Left;  ? CYSTOSCOPY/RETROGRADE/URETEROSCOPY Right 09/12/2012  ? Procedure: CYSTOSCOPY/RETROGRADE/URETEROSCOPY;  Surgeon: Franchot Gallo, MD;  Location: [facility];  Service: Urology;  Laterality: Right;  ? HOLMIUM LASER APPLICATION Right 07/02/7739  ? Procedure: HOLMIUM LASER APPLICATION;  Surgeon: Franchot Gallo, MD;  Location: [facility];  Service: Urology;  Laterality: Right;  ? HOLMIUM LASER APPLICATION Left 2/87/8676  ? Procedure: HOLMIUM LASER APPLICATION;  Surgeon: Jorja Loa, MD;  Location: [facility];  Service: Urology;  Laterality: Left;  ? KIDNEY STONE SURGERY    ? "probably 3 times"  ? LEFT HEART CATHETERIZATION WITH CORONARY ANGIOGRAM N/A 04/28/2011  ? Procedure: LEFT HEART CATHETERIZATION WITH CORONARY ANGIOGRAM;  Surgeon: Jacolyn Reedy, MD;  Location: [facility];  Service: Cardiovascular;  Laterality: N/A;  ? LITHOTRIPSY    ? "many; probably 5 times"  ? RADIAL ARTERY HARVEST  04/30/2011  ? Procedure: RADIAL ARTERY HARVEST;  Surgeon: Melrose Nakayama, MD;  Location: [facility];  Service: Open Heart Surgery;  Laterality: Left;  ? RETINAL DETACHMENT SURGERY  1990's  ? right eye; "made me have an early cataract"  ? TEE WITHOUT CARDIOVERSION N/A 06/17/2020  ? Procedure: TRANSESOPHAGEAL ECHOCARDIOGRAM (TEE);  Surgeon: Lelon Perla, MD;  Location: [facility] ENDOSCOPY;  Service: Cardiovascular;  Laterality: N/A;  ? VASECTOMY    ? ?Patient Active Problem List  ? Diagnosis Date Noted  ? BPPV (benign paroxysmal positional vertigo) 04/02/2021  ? Left pontine stroke ([place]) 07/25/2020  ? Acute right hemiparesis ([place]) 07/25/2020  ? Essential  hypertension   ? History of CVA (cerebrovascular accident)   ? CKD (chronic kidney disease), stage II   ? Diabetes mellitus type 2 in obese [facility])   ? Dyslipidemia   ? CVA (cerebral vascular accident) ([place]) 07/21/2020  ? Cryptogenic stroke ([place]) 06/18/2020  ? Acute-on-chronic kidney injury ([place]) 05/28/2020  ? Stroke [facility]) 05/27/2020  ? Elevated troponin 01/13/2019  ? COVID-19 virus infection 01/13/2019  ? S/P CABG (coronary artery bypass graft)   ? CAD (coronary artery disease) 04/28/2011  ? Insulin dependent type 2 diabetes mellitus, controlled ([place])   ? Hyperlipidemia   ? Hypertension   ? Obesity (BMI 30-39.9)   ? Nephrolithiasis, uric acid   ? ? ?ONSET DATE: 06/17/2021 (Referral Date) ? ?REFERRING DIAG:  ?H81.11 (ICD-10-CM) - Benign paroxysmal positional vertigo of right ear  ?R42 (ICD-10-CM) - Vertigo  ? ? ?THERAPY DIAG:  ?Dizziness and giddiness ? ?SUBJECTIVE:  ? ?SUBJECTIVE STATEMENT: ?Patient reports first episode occurred approximately 3 months ago. Reports had some tingling sensation in the R ear. Reports that he felt he had extreme nystagmus. Was unable to get out of the bed for about 5 days. Patient had nausea and vomiting. Reports noticed it most laying down in bed or getting back up in the morning. Reports the episodes was brief. Reports had another episode approx two weeks ago. Noticed it more when his head turned to the right. Reports he did have some clicking in the R ear. Still ambulating with SPC.  Reports that he does feel like the balance has gotten a little worse.  ?Pt accompanied by: significant other ? ?PERTINENT HISTORY: diabetes, hypertension, hyperlipidemia, coronary artery disease, arthritis, CVA/TIA ? ? ?PAIN:  ?Are you having pain? No ? ?PRECAUTIONS: Fall; Loop Recorder for A-Fib ? ?WEIGHT BEARING RESTRICTIONS No ? ?FALLS: Has patient fallen in last 6 months? No ? ?LIVING ENVIRONMENT: ?Lives with: lives with their spouse ?Lives in: House/apartment ?Has following equipment at home: Single  point cane ? ?PLOF: Independent with household mobility with device and Independent with community mobility with device ? ?PATIENT GOALS improve vertigo; self manage ? ?OBJECTIVE:  ? ?COGNITION: ?Overall cognitive status: Within functional limits for tasks assessed ?  ?SENSATION: ?WFL ? ?POSTURE: No Significant postural limitations ? ? ?TRANSFERS: ?Assistive device utilized: Single point cane  ?Sit to stand: Modified independence ?Stand to sit: Modified independence ? ?GAIT: ?Distance walked: 50 ft ?Assistive device utilized: Single point cane ?Level of assistance: Modified independence ?Comments: ambulation into/out of session ? ? ?PATIENT SURVEYS:  ?Academic librarian did not capture on Eval ? ? ?VESTIBULAR ASSESSMENT ? ?  ?  ? SYMPTOM BEHAVIOR: ?  Subjective history: See Subjective ?  Non-Vestibular symptoms: nausea/vomiting ?  Type of dizziness: Imbalance (Disequilibrium) and Spinning/Vertigo ?  Frequency: daily (at least once a day) ?  Duration: seconds to minutes ?  Aggravating factors: Induced by position change: lying supine, rolling to the right, and supine to  sit and Worse in the morning ?  Relieving factors: rest and slow movements ?  Progression of symptoms: better ? ? OCULOMOTOR EXAM: ?  Ocular Alignment:  abnormal at baseline; history of impairments post CVA ?  Ocular ROM: No Limitations ?  Spontaneous Nystagmus: absent ?  Gaze-Induced Nystagmus: absent ?  Smooth Pursuits: intact ?  Saccades: intact ? ? ? VESTIBULAR - OCULAR REFLEX:  ?  Slow VOR: Normal ?  Head-Impulse Test: HIT Right: negative ?HIT Left: negative ? ?  ? POSITIONAL TESTING: Right Dix-Hallpike: none; Duration:0 ?Left Dix-Hallpike: none; Duration: 0 ?Right Roll Test: none; Duration: 0 ?Left Roll Test: none; Duration: 0 ?Right Sidelying: none; Duration: 0 ?Left Sidelying: none; Duration: 0 ?  ? ?MOTION SENSITIVITY: ? ?  Motion Sensitivity Quotient ? ?Intensity: 0 = none, 1 = Lightheaded, 2 = Mild, 3 = Moderate, 4 = Severe, 5 = Vomiting ?  Intensity  ?1. Sitting to supine 0  ?2. Supine to L side 0  ?3. Supine to R side 0  ?4. Supine to sitting 1  ?5. L Hallpike-Dix 0  ?6. Up from L  1  ?7. R Hallpike-Dix 0  ?8. Up from R  1  ?9. Sitting, head  ?tip

## 2021-06-25 ENCOUNTER — Other Ambulatory Visit: Payer: Self-pay | Admitting: Neurology

## 2021-06-25 ENCOUNTER — Ambulatory Visit: Admission: RE | Admit: 2021-06-25 | Payer: Medicare Other | Source: Ambulatory Visit

## 2021-06-25 MED ORDER — ATIVAN 0.5 MG PO TABS: 0.5000 mg | ORAL_TABLET | Freq: Once | ORAL | 0 refills | Status: AC

## 2021-06-25 NOTE — Telephone Encounter (Signed)
Pt's wife, Mitchell Lambert; pt could not do MRI today because pt is claustrophobic and need medication during procedure. GI told pt to call neurologic to prescribe something and then call to reschedule MRI appt. Can send to [facility] #[phone] ? ?Would like a call from the nurse when sent so can reschedule MRI. ?  ?

## 2021-06-26 MED ORDER — LORAZEPAM 1 MG PO TABS: ORAL_TABLET | ORAL | 0 refills | Status: AC

## 2021-06-26 NOTE — Addendum Note (Signed)
Addended by: Noberto Retort C on: 06/26/2021 12:17 PM ? ? Modules accepted: Orders ? ?

## 2021-06-26 NOTE — Telephone Encounter (Addendum)
Rx for lorazepam 0.5 sent to pharmacy. The patient has previously had MRI and used lorazepam '1mg'$  x 2 tabs. He is asking if a new prescription can be sent in for this dosage. Rx sent 06/25/21 voided at pharmacy. His wife will be driving him. This will be sent for MD to review.  ? ? ?

## 2021-06-28 LAB — CUP PACEART REMOTE DEVICE CHECK
Date Time Interrogation Session: 20230428230135
Implantable Pulse Generator Implant Date: 20220419

## 2021-06-30 ENCOUNTER — Ambulatory Visit (INDEPENDENT_AMBULATORY_CARE_PROVIDER_SITE_OTHER): Payer: Medicare Other

## 2021-06-30 DIAGNOSIS — I6300 Cerebral infarction due to thrombosis of unspecified precerebral artery: Secondary | ICD-10-CM | POA: Diagnosis not present

## 2021-07-02 ENCOUNTER — Ambulatory Visit
Admission: RE | Admit: 2021-07-02 | Discharge: 2021-07-02 | Disposition: A | Discharge status: Home / Self Care | Payer: Medicare Other | Source: Ambulatory Visit | Attending: Neurology | Admitting: Neurology

## 2021-07-02 ENCOUNTER — Ambulatory Visit: Payer: Medicare Other | Admitting: Adult Health

## 2021-07-02 DIAGNOSIS — H8111 Benign paroxysmal vertigo, right ear: Secondary | ICD-10-CM | POA: Diagnosis not present

## 2021-07-02 DIAGNOSIS — Z20822 Contact with and (suspected) exposure to covid-19: Secondary | ICD-10-CM | POA: Diagnosis not present

## 2021-07-02 MED ORDER — GADOBENATE DIMEGLUMINE 529 MG/ML IV SOLN
20.0000 mL | Freq: Once | INTRAVENOUS | Status: AC | PRN
  Administered 2021-07-02: 20 mL via INTRAVENOUS

## 2021-07-03 ENCOUNTER — Institutional Professional Consult (permissible substitution): Payer: Medicare Other | Admitting: Neurology

## 2021-07-06 ENCOUNTER — Other Ambulatory Visit: Payer: Medicare Other

## 2021-07-10 DIAGNOSIS — H40002 Preglaucoma, unspecified, left eye: Secondary | ICD-10-CM | POA: Diagnosis not present

## 2021-07-14 NOTE — Progress Notes (Signed)
[facility] Summary Report / Loop Recorder 

## 2021-07-22 ENCOUNTER — Encounter (INDEPENDENT_AMBULATORY_CARE_PROVIDER_SITE_OTHER): Payer: Medicare Other | Admitting: Ophthalmology

## 2021-07-29 ENCOUNTER — Encounter (INDEPENDENT_AMBULATORY_CARE_PROVIDER_SITE_OTHER): Payer: Medicare Other | Admitting: Ophthalmology

## 2021-07-29 DIAGNOSIS — H33303 Unspecified retinal break, bilateral: Secondary | ICD-10-CM | POA: Diagnosis not present

## 2021-07-29 DIAGNOSIS — E113312 Type 2 diabetes mellitus with moderate nonproliferative diabetic retinopathy with macular edema, left eye: Secondary | ICD-10-CM | POA: Diagnosis not present

## 2021-07-29 DIAGNOSIS — E113391 Type 2 diabetes mellitus with moderate nonproliferative diabetic retinopathy without macular edema, right eye: Secondary | ICD-10-CM

## 2021-07-29 DIAGNOSIS — I10 Essential (primary) hypertension: Secondary | ICD-10-CM

## 2021-07-29 DIAGNOSIS — H35033 Hypertensive retinopathy, bilateral: Secondary | ICD-10-CM | POA: Diagnosis not present

## 2021-07-29 DIAGNOSIS — H43813 Vitreous degeneration, bilateral: Secondary | ICD-10-CM

## 2021-08-03 LAB — CUP PACEART REMOTE DEVICE CHECK
Date Time Interrogation Session: 20230531230640
Implantable Pulse Generator Implant Date: 20220419

## 2021-08-04 ENCOUNTER — Ambulatory Visit (INDEPENDENT_AMBULATORY_CARE_PROVIDER_SITE_OTHER): Payer: Medicare Other

## 2021-08-04 DIAGNOSIS — I6300 Cerebral infarction due to thrombosis of unspecified precerebral artery: Secondary | ICD-10-CM | POA: Diagnosis not present

## 2021-08-05 DIAGNOSIS — H40002 Preglaucoma, unspecified, left eye: Secondary | ICD-10-CM | POA: Diagnosis not present

## 2021-08-20 NOTE — Progress Notes (Signed)
[facility] Summary Report / Loop Recorder 

## 2021-08-30 IMAGING — MR MR HEAD W/O CM
6 of 10 series · 27 of 48 positions shown · non-contrast
Comparison: None.

CLINICAL DATA: Aphasia

EXAM:
MRI HEAD WITHOUT CONTRAST
TECHNIQUE: Multiplanar, multiecho pulse sequences of the brain and surrounding
structures were obtained without intravenous contrast.

[Series 2: DWI · axial · 3.0mm · 0.94mm/px · z∈[-61,+86]mm · 8 of 100 slices shown (1 of 2)]
[im 1/100]
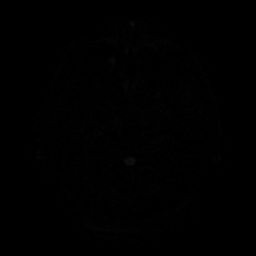
[im 12/100]
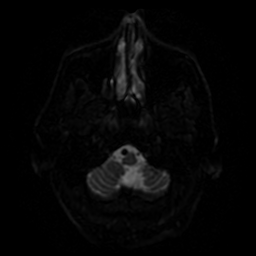
[im 34/100]
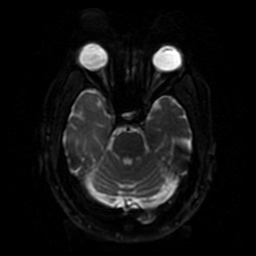
[im 45/100]
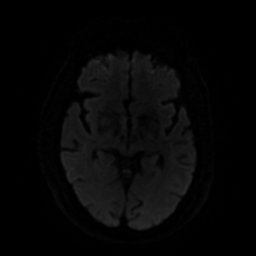
[im 56/100]
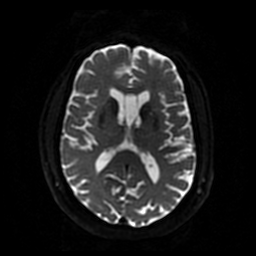
[im 67/100]
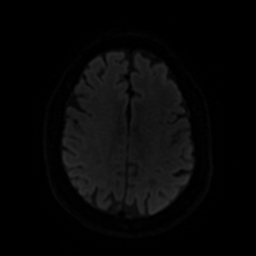
[im 89/100]
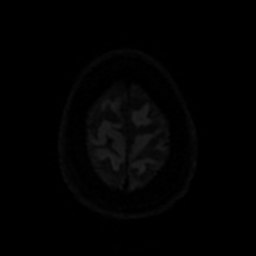
[im 100/100]
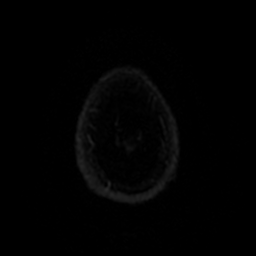

[Series 3: DWI · coronal · 4.0mm · 0.94mm/px · 7 of 74 slices shown (2 of 2)]
[im 1/74]
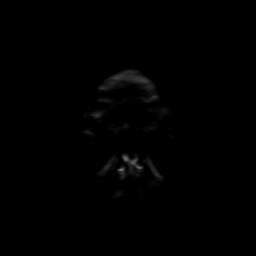
[im 13/74]
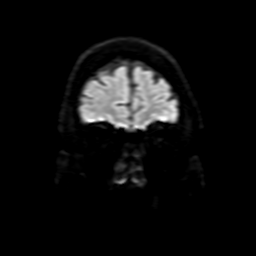
[im 25/74]
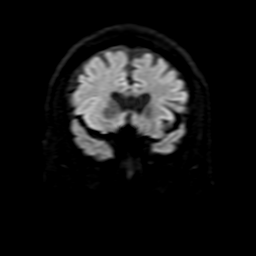
[im 37/74]
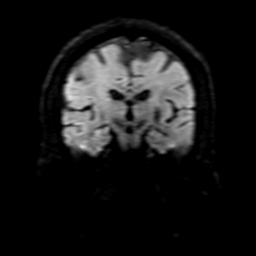
[im 49/74]
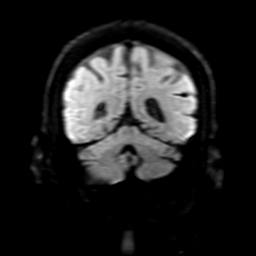
[im 61/74]
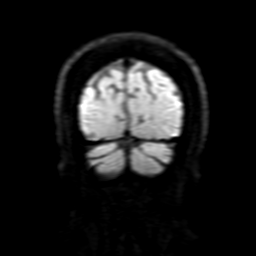
[im 74/74]
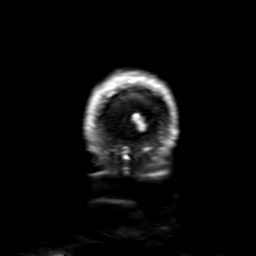

[Series 4: FLAIR · sagittal · 5.0mm · 0.23mm/px · 2 of 23 slices shown (1 of 2)]
[im 1/23]
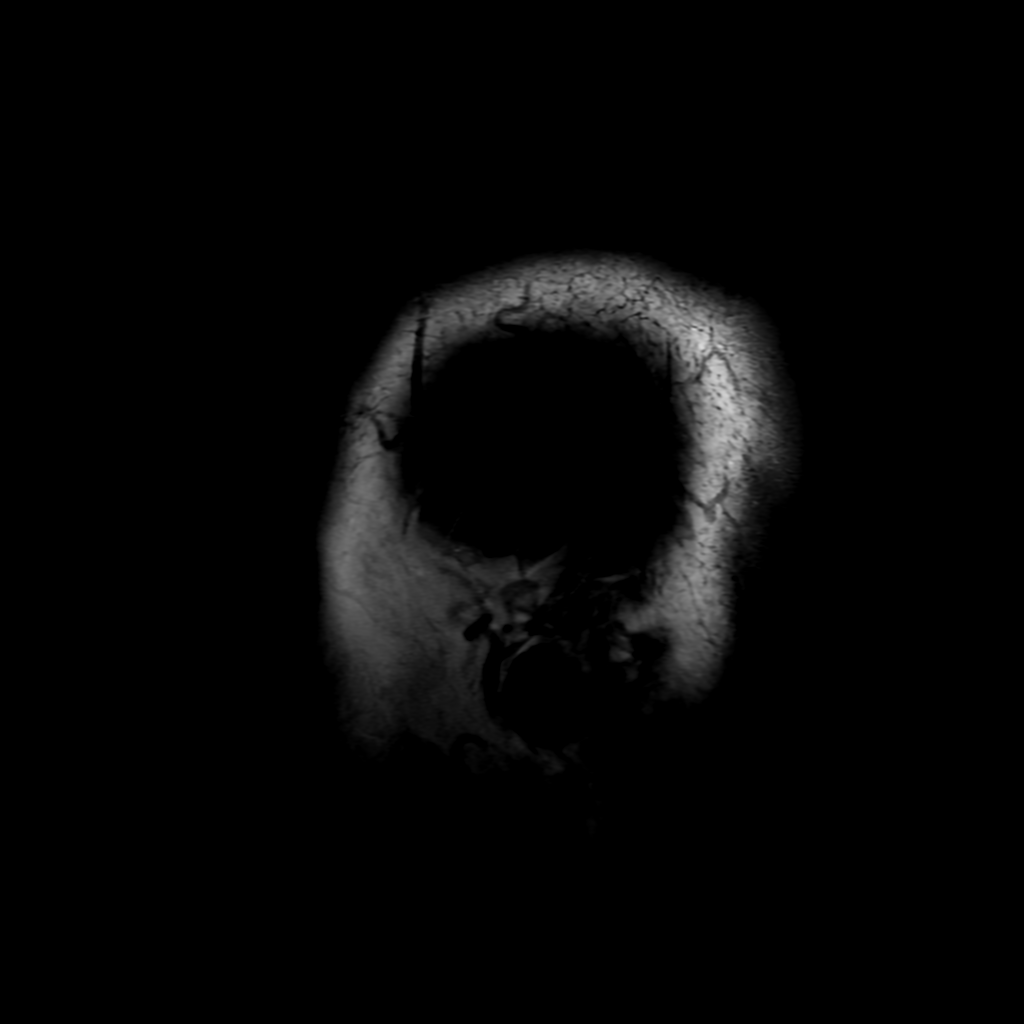
[im 23/23]
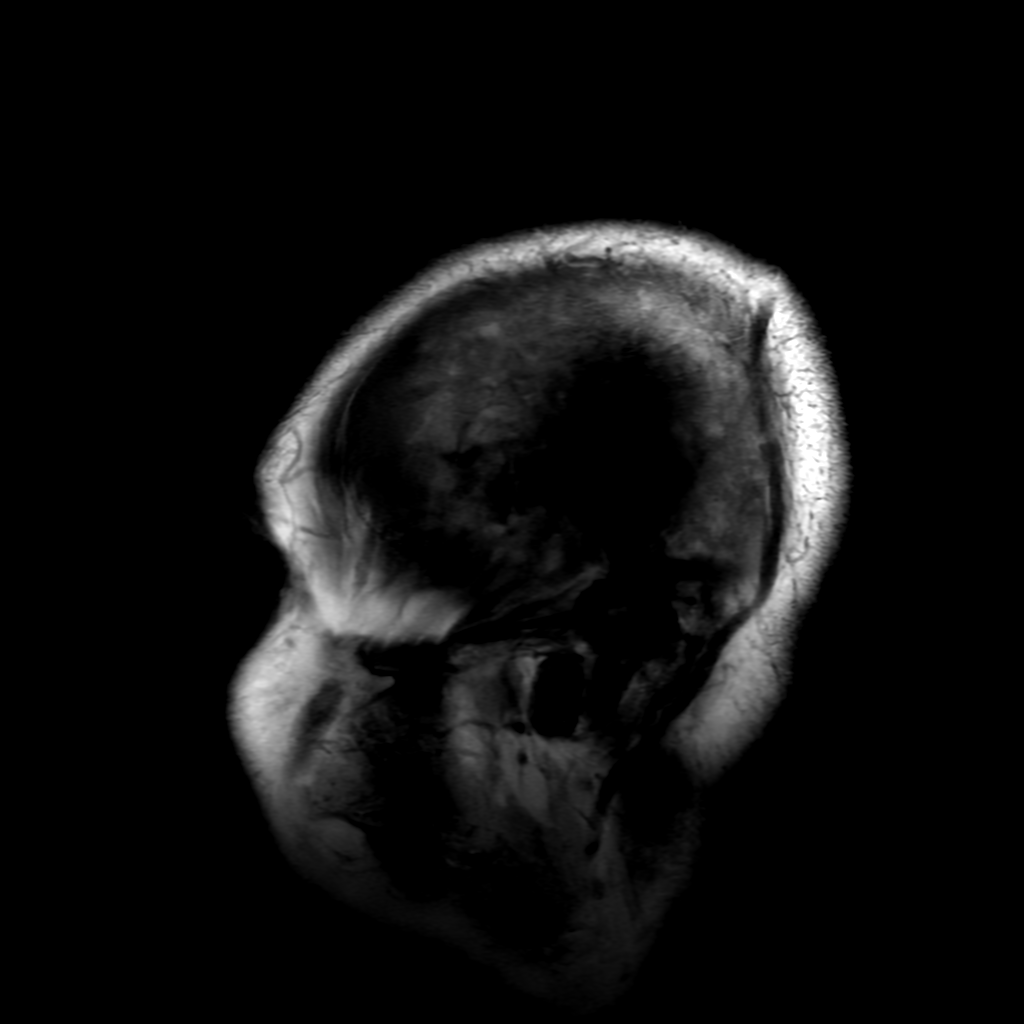

[Series 6: FLAIR · axial · 3.0mm · 0.45mm/px · z∈[-64,+86]mm · 2 of 26 slices shown (2 of 2)]
[im 1/26]
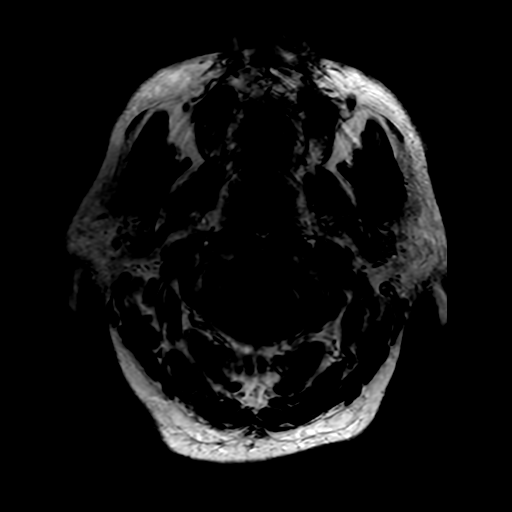
[im 26/26]
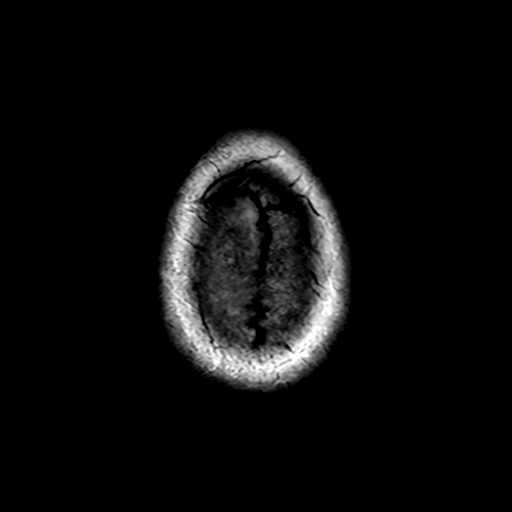

[Series 250: ADC · axial · 3.0mm · 0.94mm/px · z∈[-61,+86]mm · 5 of 50 slices shown (1 of 2)]
[im 1/50]
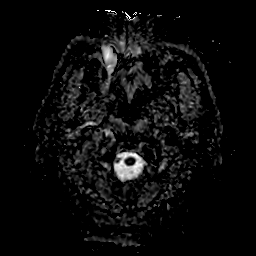
[im 13/50]
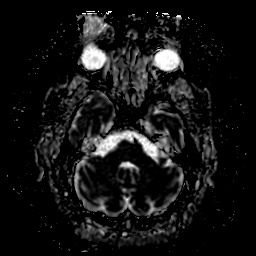
[im 25/50]
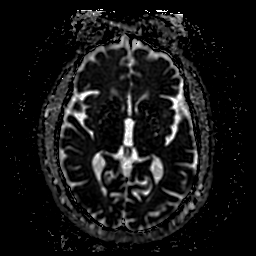
[im 37/50]
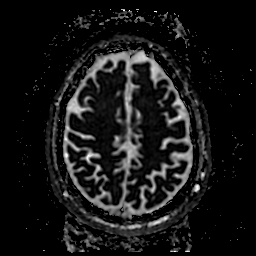
[im 50/50]
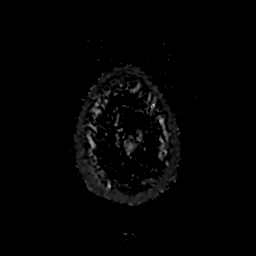

[Series 350: ADC · coronal · 4.0mm · 0.94mm/px · 3 of 37 slices shown (2 of 2)]
[im 1/37]
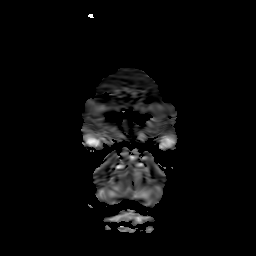
[im 19/37]
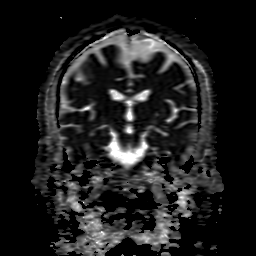
[im 37/37]
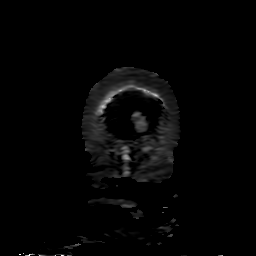

[27 of 48 positions shown; findings below may reference images not displayed]

FINDINGS: Brain: No acute infarct, mass effect or extra-axial collection. No
acute or chronic hemorrhage. Old left parietal infarct. White matter
signal otherwise normal. Normal volume of CSF spaces. Incidentally
noted cavum septum pellucidum et vergae.

Vascular: Major flow voids are preserved.

Skull and upper cervical spine: Normal calvarium and skull base.
Visualized upper cervical spine and soft tissues are normal.

Sinuses/Orbits:No paranasal sinus fluid levels or advanced mucosal
thickening. No mastoid or middle ear effusion. Normal orbits.
IMPRESSION: 1. No acute intracranial abnormality.
2. Old left parietal infarct.

## 2021-08-30 IMAGING — CT CT HEAD CODE STROKE
3 series · 15 of 47 positions shown, 18 images · non-contrast
Comparison: None.

CLINICAL DATA: Code stroke.  Aphasia.

EXAM:
CT HEAD WITHOUT CONTRAST
TECHNIQUE: Contiguous axial images were obtained from the base of the skull
through the vertex without intravenous contrast.

[Series 3: head wo · axial · 0.47mm/px · z∈[-165,-15]mm · 9 of 36 slices shown, 12 images]
[im 3/36  brain]
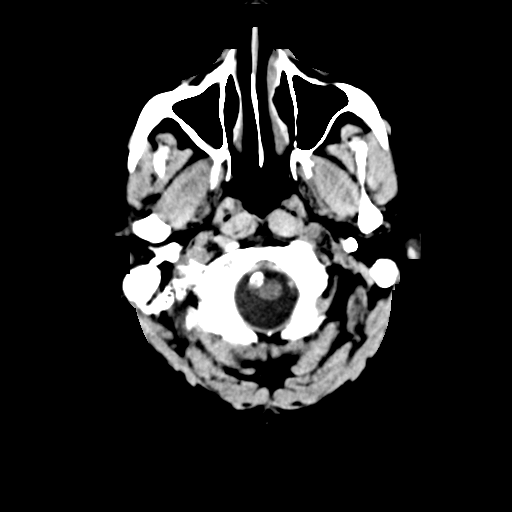
[im 3/36  bone]
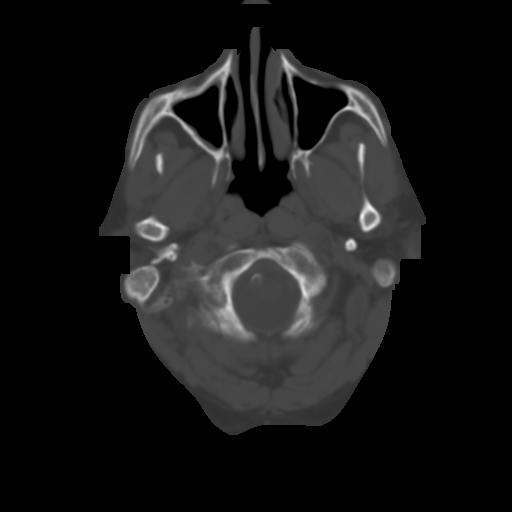
[im 7/36  brain]
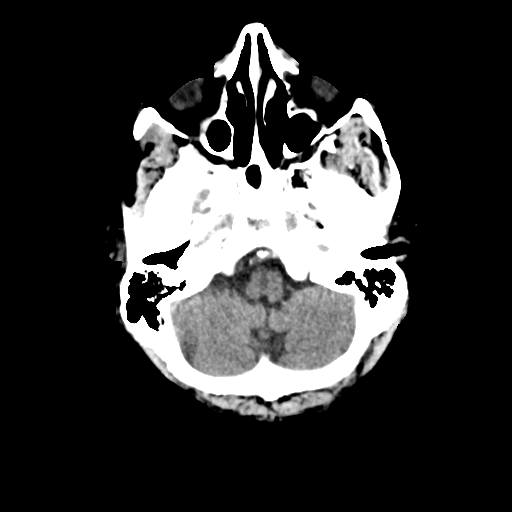
[im 10/36  brain]
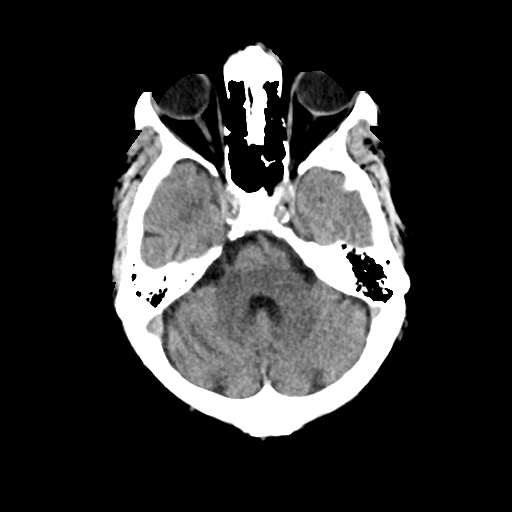
[im 14/36  brain]
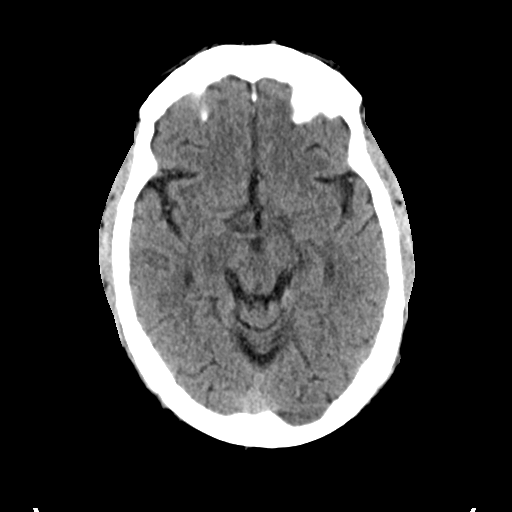
[im 19/36  brain]
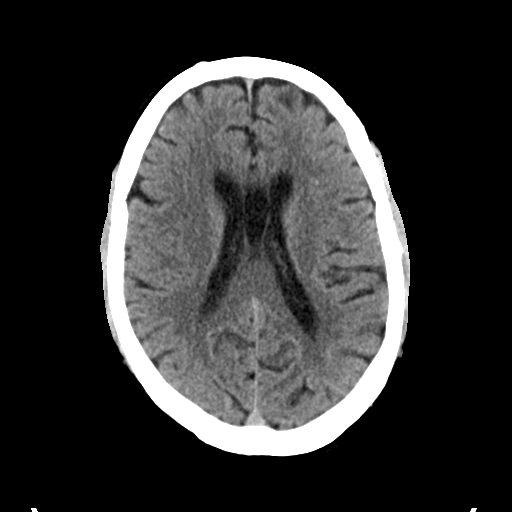
[im 19/36  bone]
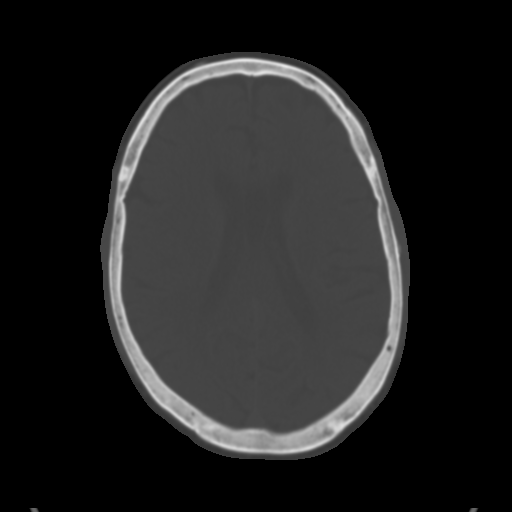
[im 22/36  brain]
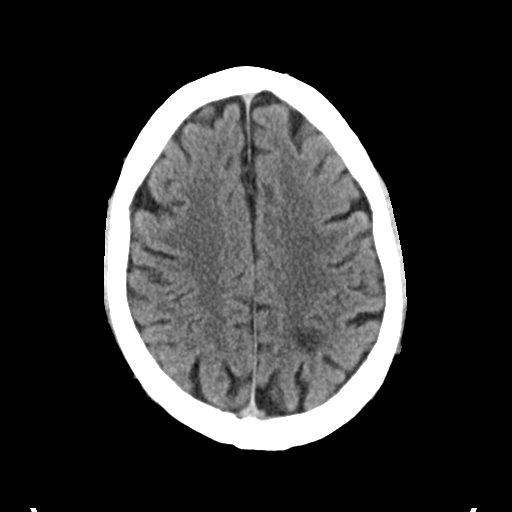
[im 26/36  brain]
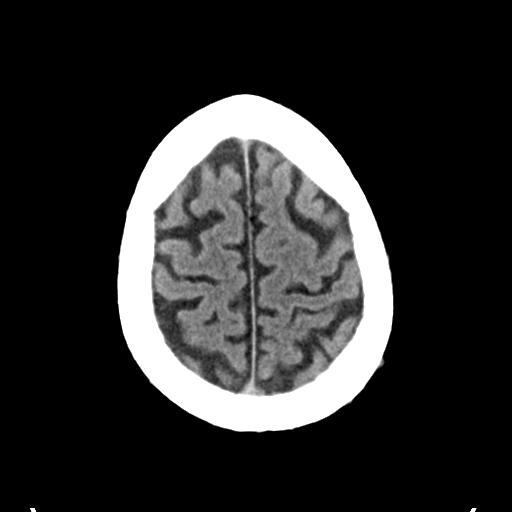
[im 29/36  brain]
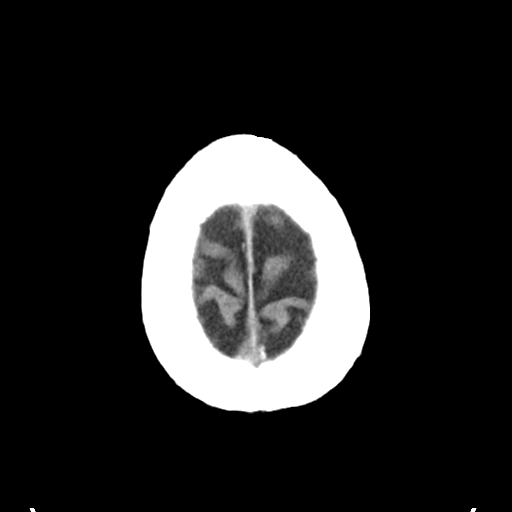
[im 33/36  brain]
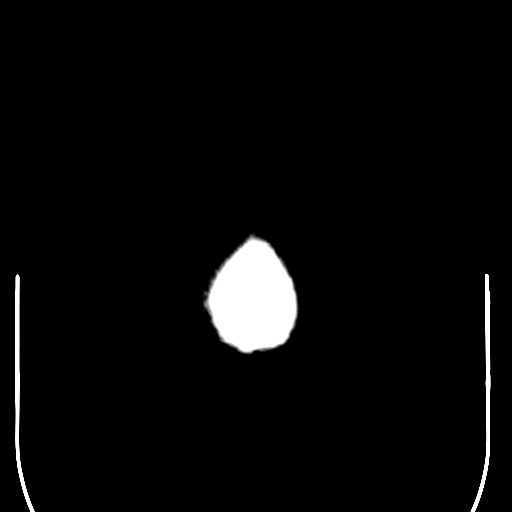
[im 33/36  bone]
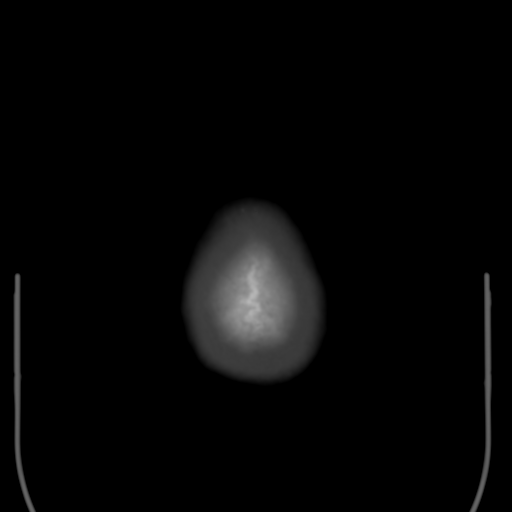

[Series 6: coronal soft tissue · coronal · 0.32mm/px · 3 of 75 slices shown]
[im 25/75  brain]
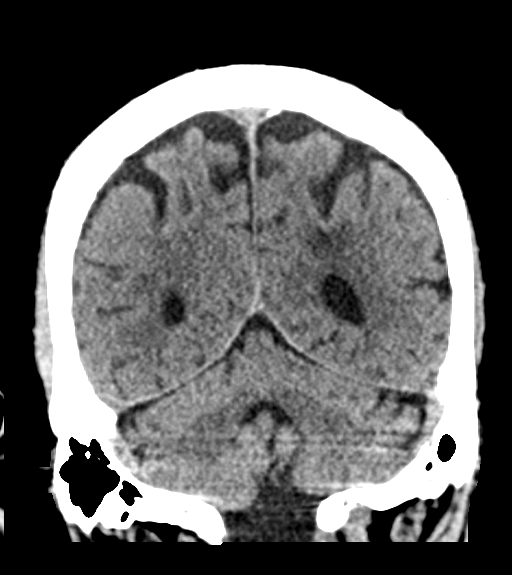
[im 33/75  brain]
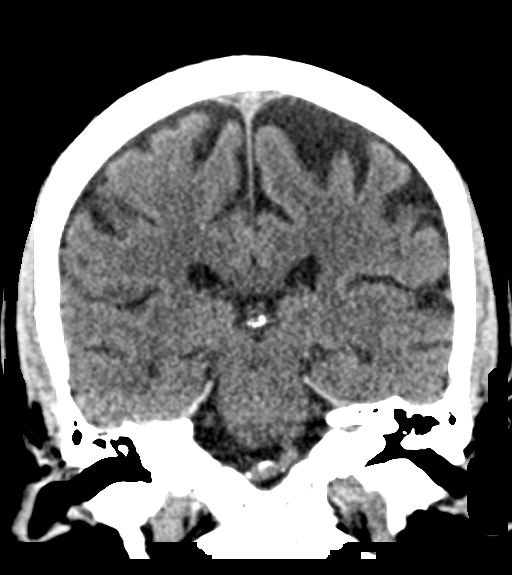
[im 42/75  brain]
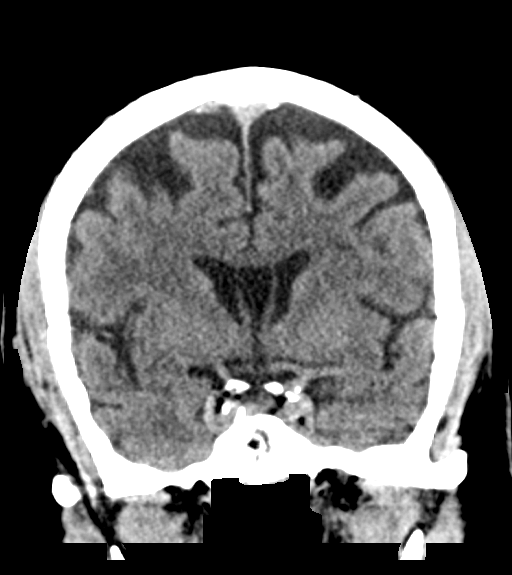

[Series 7: sagittal soft tissue · sagittal · 0.36mm/px · 3 of 60 slices shown]
[im 20/60  brain]
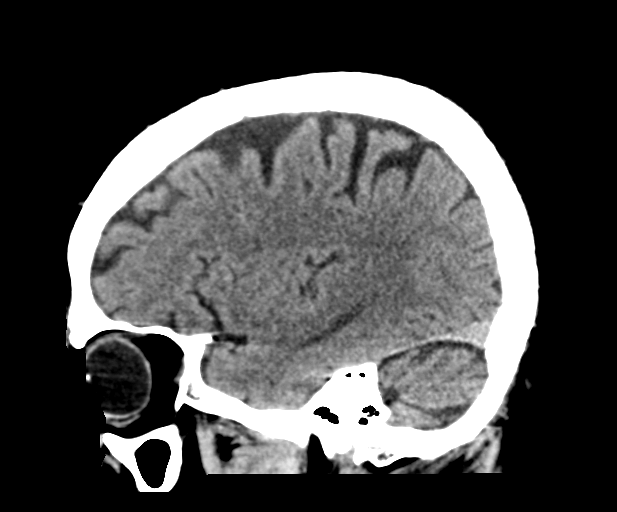
[im 30/60  brain]
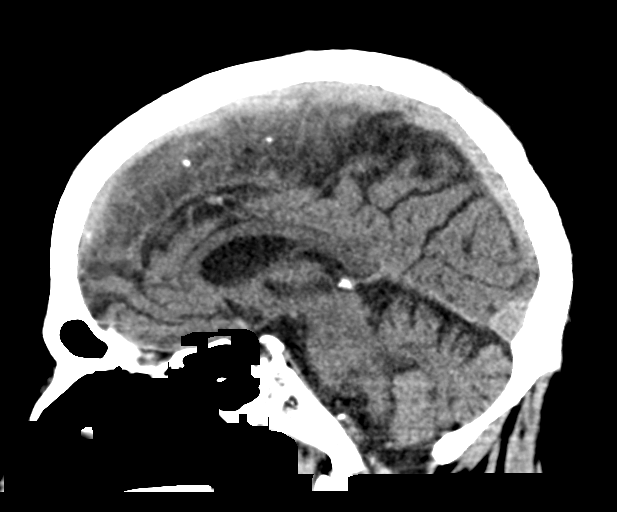
[im 40/60  brain]
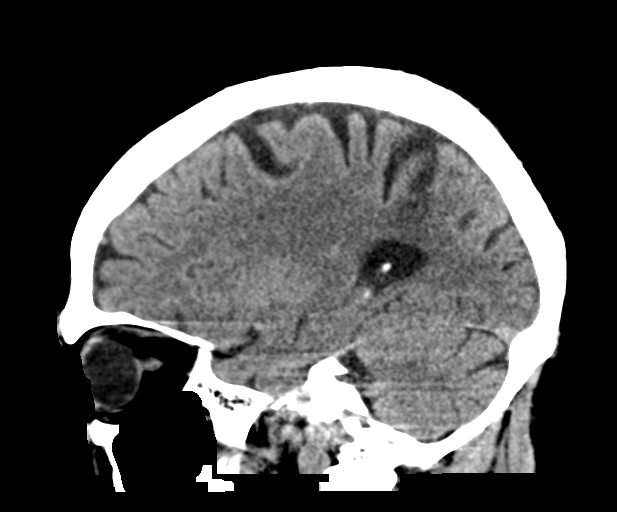

[15 of 47 positions shown; findings below may reference images not displayed]

FINDINGS: Brain: No evidence of acute large vascular territory infarct. No
acute hemorrhage. No mass lesion or abnormal mass effect. No
hydrocephalus. Cavum septum pellucidum et vergae, anatomic variant.

Vascular: Calcific atherosclerosis.  No hyperdense vessel.

Skull: No acute fracture. Small bony hyperostosis along the right
frontal calvarium.

Sinuses/Orbits: Small right maxillary sinus with mucosal thickening,
possibly the sequela of chronic sinusitis. Otherwise, sinuses are
largely clear. Unremarkable orbits.

Other: No mastoid effusions.
IMPRESSION: Ill-defined hypoattenuation involving the left parietal white matter
with slight loss of gray-white differentiation, concerning for age
indeterminate infarct. Given the patient's symptoms and absence of
priors for comparison, recommend MRI to further evaluate.

Code stroke imaging results were communicated on 05/27/2020 at [DATE] to provider Dr. Tosha Via telephone, who verbally acknowledged
these results.

## 2021-09-07 LAB — CUP PACEART REMOTE DEVICE CHECK
Date Time Interrogation Session: 20230703230855
Implantable Pulse Generator Implant Date: 20220419

## 2021-09-08 ENCOUNTER — Ambulatory Visit (INDEPENDENT_AMBULATORY_CARE_PROVIDER_SITE_OTHER): Payer: Medicare Other

## 2021-09-08 DIAGNOSIS — I639 Cerebral infarction, unspecified: Secondary | ICD-10-CM | POA: Diagnosis not present

## 2021-09-16 DIAGNOSIS — R350 Frequency of micturition: Secondary | ICD-10-CM | POA: Diagnosis not present

## 2021-09-16 DIAGNOSIS — M545 Low back pain, unspecified: Secondary | ICD-10-CM | POA: Diagnosis not present

## 2021-09-23 DIAGNOSIS — M9902 Segmental and somatic dysfunction of thoracic region: Secondary | ICD-10-CM | POA: Diagnosis not present

## 2021-09-23 DIAGNOSIS — M4604 Spinal enthesopathy, thoracic region: Secondary | ICD-10-CM | POA: Diagnosis not present

## 2021-09-23 DIAGNOSIS — M9901 Segmental and somatic dysfunction of cervical region: Secondary | ICD-10-CM | POA: Diagnosis not present

## 2021-09-23 DIAGNOSIS — H814 Vertigo of central origin: Secondary | ICD-10-CM | POA: Diagnosis not present

## 2021-09-25 DIAGNOSIS — M9901 Segmental and somatic dysfunction of cervical region: Secondary | ICD-10-CM | POA: Diagnosis not present

## 2021-09-25 DIAGNOSIS — M9902 Segmental and somatic dysfunction of thoracic region: Secondary | ICD-10-CM | POA: Diagnosis not present

## 2021-09-25 DIAGNOSIS — H814 Vertigo of central origin: Secondary | ICD-10-CM | POA: Diagnosis not present

## 2021-09-25 DIAGNOSIS — M4604 Spinal enthesopathy, thoracic region: Secondary | ICD-10-CM | POA: Diagnosis not present

## 2021-09-29 DIAGNOSIS — H814 Vertigo of central origin: Secondary | ICD-10-CM | POA: Diagnosis not present

## 2021-09-29 DIAGNOSIS — M4604 Spinal enthesopathy, thoracic region: Secondary | ICD-10-CM | POA: Diagnosis not present

## 2021-09-29 DIAGNOSIS — M9902 Segmental and somatic dysfunction of thoracic region: Secondary | ICD-10-CM | POA: Diagnosis not present

## 2021-09-29 DIAGNOSIS — M9901 Segmental and somatic dysfunction of cervical region: Secondary | ICD-10-CM | POA: Diagnosis not present

## 2021-10-08 LAB — CUP PACEART REMOTE DEVICE CHECK
Date Time Interrogation Session: 20230805230813
Implantable Pulse Generator Implant Date: 20220419

## 2021-10-09 NOTE — Progress Notes (Signed)
[facility] Summary Report / Loop Recorder 

## 2021-10-13 ENCOUNTER — Ambulatory Visit: Payer: Medicare Other

## 2021-11-06 DIAGNOSIS — H40002 Preglaucoma, unspecified, left eye: Secondary | ICD-10-CM | POA: Diagnosis not present

## 2021-11-13 DIAGNOSIS — Z125 Encounter for screening for malignant neoplasm of prostate: Secondary | ICD-10-CM | POA: Diagnosis not present

## 2021-11-13 DIAGNOSIS — R351 Nocturia: Secondary | ICD-10-CM | POA: Diagnosis not present

## 2021-11-13 DIAGNOSIS — R300 Dysuria: Secondary | ICD-10-CM | POA: Diagnosis not present

## 2021-11-13 DIAGNOSIS — N401 Enlarged prostate with lower urinary tract symptoms: Secondary | ICD-10-CM | POA: Diagnosis not present

## 2021-11-17 ENCOUNTER — Ambulatory Visit (INDEPENDENT_AMBULATORY_CARE_PROVIDER_SITE_OTHER): Payer: Medicare Other

## 2021-11-17 DIAGNOSIS — I639 Cerebral infarction, unspecified: Secondary | ICD-10-CM

## 2021-11-18 LAB — CUP PACEART REMOTE DEVICE CHECK
Date Time Interrogation Session: 20230917230724
Implantable Pulse Generator Implant Date: 20220419

## 2021-11-28 NOTE — Progress Notes (Signed)
[facility] Summary Report / Loop Recorder 

## 2021-12-17 ENCOUNTER — Encounter: Payer: Self-pay | Admitting: Neurology

## 2021-12-17 ENCOUNTER — Ambulatory Visit (INDEPENDENT_AMBULATORY_CARE_PROVIDER_SITE_OTHER): Payer: Medicare Other | Admitting: Neurology

## 2021-12-17 VITALS — BP 129/69 | HR 48

## 2021-12-17 DIAGNOSIS — G8929 Other chronic pain: Secondary | ICD-10-CM

## 2021-12-17 DIAGNOSIS — I639 Cerebral infarction, unspecified: Secondary | ICD-10-CM | POA: Diagnosis not present

## 2021-12-17 DIAGNOSIS — R42 Dizziness and giddiness: Secondary | ICD-10-CM

## 2021-12-17 DIAGNOSIS — R269 Unspecified abnormalities of gait and mobility: Secondary | ICD-10-CM | POA: Diagnosis not present

## 2021-12-17 DIAGNOSIS — M545 Low back pain, unspecified: Secondary | ICD-10-CM

## 2021-12-17 DIAGNOSIS — M7989 Other specified soft tissue disorders: Secondary | ICD-10-CM

## 2021-12-17 DIAGNOSIS — I69398 Other sequelae of cerebral infarction: Secondary | ICD-10-CM | POA: Diagnosis not present

## 2021-12-17 NOTE — Progress Notes (Signed)
[facility] [address] [place]. [place] [address] [phone]       OFFICE FOLLOW-UP NOTE  [name] Date of Birth:  1951-01-09 Medical Record Number:  [id number]   HPI: Office visit 12/25/2020 Mitchell Lambert is a 71 year old Caucasian male seen today for initial office visit following hospital admission for stroke and TIA in March and May 2022.  Is accompanied by his wife.  History is obtained from them and review of electronic medical records.  I personally reviewed available imaging films in PACS.  He has past medical history of diabetes, hypertension, hyperlipidemia, coronary artery disease, arthritis.  He initially presented on 05/27/2020 to [facility] long hospital with sudden onset of aphasia with difficulty seeing on the right side as well.  His blood pressure on initial evaluation was high with systolic being in the 469G but symptoms did not last long.  CT scan showed a left parietal hypodensity which was suggestive of age-indeterminate infarct.  MRI subsequently showed a old left parietal infarct and MRA showed no LVO but stenosis of proximal right P2 segment which was asymptomatic.  LDL cholesterol is '1 1 9 '$ mg percent hemoglobin A1c 8.4.  2D echo showed ejection fraction of 55 to 60% without cardiac source of embolism.  Left atrial size is moderately dilated.  He was diagnosed with a left hemispheric TIA in placed on Plavix.  He had outpatient TEE done on 06/17/2020 which showed no left atrial thrombus or PFO.  He had a loop recorder inserted as an outpatient and so for paroxysmal A. fib has not been found.  He however presented back on 07/21/2020 with sudden onset of dizziness and diplopia with left 6th nerve palsy.  CTA head was unremarkable but MRI scan showed small acute infarcts at the left pontomedullary junction in the left cerebral white matter.  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  Carotid  ultrasound showed no extracranial stenosis.  Echocardiogram was not repeated.  LDL cholesterol was 88 mg percent and hemoglobin A1c was 8.5.  Patient was switched from Plavix to aspirin 81 mg and Brilinta 90 mg twice daily for 4 weeks.  Patient was to inpatient rehab and has been discharging home for a week.  He states his gait and balance are improved is walking a lot better.  He still has a right foot drop but wears an ankle brace which helps him walk.  He still has diplopia however and has to close 1 eye.  He had 1 visit with home PT and OT and was not impressed and prefers that I arrange outpatient therapies for him.  Patient was screened for participation in the sleep smart study during his recent hospitalization and tested positive on the NOx 3 monitor however he was a screen failure as he was unable to tolerate the CPAP mask.  He states he is tolerating Brilinta and aspirin well without bruising or bleeding.  Blood pressures under good control. Update 06/17/2021 : He returns for follow-up after last visit 4 months ago.  Is accompanied by his wife.  He states 2 and half months ago he woke up 1 day with sudden onset of vertigo, room spinning, eyes rolling around.  Severe nausea and vomited.  He was miserable for several days and gradually started improving.  He reported mostly vertigo and with change of position getting up out of bed or turning sides.  This seems to have gradually improved.  He  complains of easy fatigability and tiredness when that happens he is still dragging his right foot.  His right shoulder remains painful and stiff and does not move as much as the left side.  He denies any tinnitus, ringing in the ear, head and ear trauma.  He denies any other strokelike symptoms.  He remains on Plavix which is tolerating well with minor bruising and no bleeding.  His blood sugars have been doing good and last hemoglobin A1c is down to 6.7.  Is tolerating Lipitor well without side effects and last lipid  profile was satisfactory.  Blood pressure is normally well at home though today it is slightly elevated in office at 152/78.  He has a loop recorder and so far paroxysmal A-fib has not yet been found.    Update 12/17/2021 ; she returns for follow-up after last visit 6 months ago.  He is accompanied by his wife.  He states he is intermittent dizziness and vertigo has subsided a couple of months ago episodes.  He did benefit by going to vestibular rehab and doing vestibular stabilization exercises.  Plavix for stroke prevention is tolerating well without bleeding or bruising.  He is tolerating Lipitor well without muscle aches and pains.  His blood pressure is under good control and today it is 03/31/1968 .  Fasting sugars are all been good and last A1c was 6.7.  He has not had any recurrent stroke or TIA symptoms.  He has noticed swelling of the right leg off-and-on for the last few months particularly towards the evening.  He has also noticed increased back pain more dragging and tightness of the right leg.  He denies any falls injuries or acute radicular pain.  He has not yet discussed this with his primary physician ROS:   14 system review of systems is positive for vertigo, nausea, dizziness, weakness, dizziness, gait imbalance and all other systems negative  PMH:  Past Medical History:  Diagnosis Date   Angina    NO ANGINA SINCE CABG   Arthritis    right ankle   Chronic daily headache    "@ least every other day""improved since heart surgery"   Coronary artery disease    [facility]G [facility]GERY - DR. TILLEY IS HIS CARDIOLOGIST   Diabetes mellitus    Lantus x 10 yrs   Gout    History of kidney stones 09-08-12   multiple times with some lithotripsies   Hyperlipemia    Hypertension     Social History:  Social History   Socioeconomic History   Marital status: Married    Spouse name: Mitchell Lambert   Number of children: Not on file   Years of education: Not on file    Highest education level: Not on file  Occupational History   Not on file  Tobacco Use   Smoking status: Never   Smokeless tobacco: Never  Vaping Use   Vaping Use: Never used  Substance and Sexual Activity   Alcohol use: Yes    Comment:  OCCAS BEER  OR MIXED DRINK LAST MARIJUANA COUPLE MONTHS AGO   Drug use: Yes    Types: Marijuana    Comment: last marijuana use ~ 2010; "very rare". since   Sexual activity: Yes  Other Topics Concern   Not on file  Social History Narrative   Lives with wife   Right Handed   Drinks soda every other day   Social Determinants of Health   Financial Resource Strain:  Not on file  Food Insecurity: Not on file  Transportation Needs: Not on file  Physical Activity: Not on file  Stress: Not on file  Social Connections: Not on file  Intimate Partner Violence: Not on file    Medications:   Current Outpatient Medications on File Prior to Visit  Medication Sig Dispense Refill   ACCU-CHEK GUIDE test strip USE TO TEST BLOOD SUGAR 2 TO 3 TIMES A DAY  5   allopurinol (ZYLOPRIM) 300 MG tablet Take 300 mg by mouth daily.     amLODipine (NORVASC) 5 MG tablet TAKE 1 TABLET(5 MG) BY MOUTH DAILY (Patient taking differently: Take 5 mg by mouth daily.) 90 tablet 3   atorvastatin (LIPITOR) 40 MG tablet Take 40 mg by mouth daily.     B-D ULTRAFINE III SHORT PEN 31G X 8 MM MISC USE UTD WITH FLEXPEN.  3   baclofen (LIORESAL) 10 MG tablet Take 1 tablet (10 mg total) by mouth 3 (three) times daily as needed for muscle spasms. 90 each 2   bisoprolol (ZEBETA) 5 MG tablet Take 5 mg by mouth daily.     clopidogrel (PLAVIX) 75 MG tablet Take 1 tablet (75 mg total) by mouth daily. 30 tablet 11   insulin aspart (NOVOLOG FLEXPEN) 100 UNIT/ML FlexPen Inject 5 Units into the skin as needed for high blood sugar. If you eat more than 50% of your meal and if blood sugar is >120 15 mL 11   Insulin Glargine (BASAGLAR KWIKPEN) 100 UNIT/ML Inject 70 Units into the skin in the morning. In  AM     LORazepam (ATIVAN) 0.5 MG tablet Take 0.5 mg by mouth as needed for anxiety.     magnesium gluconate (MAGONATE) 500 MG tablet Take 2 tablets (1,000 mg total) by mouth at bedtime. 60 tablet 0   nitroGLYCERIN (NITROSTAT) 0.4 MG SL tablet Place 0.4 mg under the tongue as needed for chest pain.     traMADol (ULTRAM) 50 MG tablet Take 50 mg by mouth as needed.     LORazepam (ATIVAN) 1 MG tablet Take 1 tablet thirty minutes prior to MRI.  May take one additional tablet before entering scanner, if needed.  MUST HAVE DRIVER. 2 tablet 0   Multiple Vitamins-Minerals (MULTIVITAMIN WITH MINERALS) tablet Take 1 tablet by mouth daily.     No current facility-administered medications on file prior to visit.    Allergies:   Allergies  Allergen Reactions   Gabapentin Other (See Comments)    "made me so dizzy I missed work for 2 days"   Metformin And Related Shortness Of Breath   Sulfa Antibiotics Shortness Of Breath and Rash   Azithromycin Other (See Comments)    not good for heart condition   Ciprofloxacin Other (See Comments)    tendon rupture   Lisinopril Cough   Losartan Potassium Other (See Comments)    hypotension   Olmesartan Cough   Simvastatin Cough   Amoxicillin Rash and Other (See Comments)    rash Did it involve swelling of the face/tongue/throat, SOB, or low BP? N Did it involve sudden or severe rash/hives, skin peeling, or any reaction on the inside of your mouth or nose?  Y Did you need to seek medical attention at a hospital or doctor's office? N When did it last happen?  "years and years ago"     If all above answers are "NO", may proceed with cephalosporin use.    Doxycycline Rash   Penicillins Rash  Physical Exam General: Mildly obese elderly Caucasian male  seated, in no evident distress Head: head normocephalic and atraumatic.  Neck: supple with no carotid or supraclavicular bruits Cardiovascular: regular rate and rhythm, no murmurs Musculoskeletal: no  deformity except mild right foot drop Skin:  no rash/petichiae asymmetric swelling of the right leg but no calf tenderness. Vascular:  Normal pulses all extremities Vitals:   12/17/21 1429  BP: 129/69  Pulse: (!) 48   Neurologic Exam Mental Status: Awake and fully alert. Oriented to place and time. Recent and remote memory intact. Attention span, concentration and fund of knowledge appropriate. Mood and affect appropriate.  Cranial Nerves: Fundoscopic exam not done. Pupils equal, briskly reactive to light. Extraocular movements full  e.  No nystagmus.  Visual fields full to confrontation. Hearing intact. Facial sensation intact. Face, tongue, palate moves normally and symmetrically.  Motor: Normal bulk and tone. Normal strength in all tested extremity muscles except mild weakness of right grip and intrinsic hand muscles orbits left over right upper extremity.  Mild right ankle dorsiflexor weakness with foot drop.    Sensory.: intact to touch ,pinprick .position and vibratory sensation.  Coordination: Rapid alternating movements normal in all extremities. Finger-to-nose and heel-to-shin performed accurately bilaterally.    Gait and Station: Arises from chair without difficulty. Stance is normal. Gait demonstrates slight dragging of the right foot with right foot brace.  Unsteady on a narrow base and unable to walk tandem  reflexes: 1+ and symmetric. Toes downgoing.      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  Vascular risk factors of hypertension, hyperlipidemia, coronary artery disease, sleep apnea and diabetes.  New onset positional vertigo likely benign paroxysmal positional vertigo which appears to have resolved.  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  He has no complaints of right hence we will check lower extremity venous Dopplers for DVT.  I also encouraged him to do regular back stretching exercises for his muscular low back pain.  I advised him to wear a AFO while walking for better gait stability.  Continue Plavix for stroke prevention and maintain aggressive risk factor modification with strict control of hypertension with blood pressure goal below 140/90, lipids LDL cholesterol goal below 70 mg percent and diabetes with hemoglobin A1c goal below 6.5%.  She will return for follow-up in the future only as needed Greater than 50% of time during this 45 minute visit was spent on counseling,explanation of diagnosis, of vertigo, benign paroxysmal positional vertigo , low back pain, gait difficulty and right leg swelling and cryptogenic stroke planning of further management, discussion with patient and family and coordination of care Antony Contras, MD Note: This document was prepared with digital dictation and possible smart phrase technology. Any transcriptional errors that result from this process are unintentional

## 2021-12-17 NOTE — Patient Instructions (Signed)
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  He has no complaints of right hence we will check lower extremity venous Dopplers for DVT.  I also encouraged him to do regular back stretching exercises for his muscular low back pain.  I advised him to wear a AFO while walking for better gait stability.  Continue Plavix for stroke prevention and maintain aggressive risk factor modification with strict control of hypertension with blood pressure goal below 140/90, lipids LDL cholesterol goal below 70 mg percent and diabetes with hemoglobin A1c goal below 6.5%.  She will return for follow-up in the future only as needed  Back Exercises The following exercises strengthen the muscles that help to support the trunk (torso) and back. They also help to keep the lower back flexible. Doing these exercises can help to prevent or lessen existing low back pain. If you have back pain or discomfort, try doing these exercises 2-3 times each day or as told by your health care provider. As your pain improves, do them once each day, but increase the number of times that you repeat the steps for each exercise (do more repetitions). To prevent the recurrence of back pain, continue to do these exercises once each day or as told by your health care provider. Do exercises exactly as told by your health care provider and adjust them as directed. It is normal to feel mild stretching, pulling, tightness, or discomfort as you do these exercises, but you should stop right away if you feel sudden pain or your pain gets worse. Exercises Single knee to chest Repeat these steps 3-5 times for each leg: Lie on your back on a firm bed or the floor with your legs extended. Bring one knee to your chest. Your other leg should stay extended and in contact  with the floor. Hold your knee in place by grabbing your knee or thigh with both hands and hold. Pull on your knee until you feel a gentle stretch in your lower back or buttocks. Hold the stretch for 10-30 seconds. Slowly release and straighten your leg.  Pelvic tilt Repeat these steps 5-10 times: Lie on your back on a firm bed or the floor with your legs extended. Bend your knees so they are pointing toward the ceiling and your feet are flat on the floor. Tighten your lower abdominal muscles to press your lower back against the floor. This motion will tilt your pelvis so your tailbone points up toward the ceiling instead of pointing to your feet or the floor. With gentle tension and even breathing, hold this position for 5-10 seconds.  Cat-cow Repeat these steps until your lower back becomes more flexible: Get into a hands-and-knees position on a firm bed or the floor. Keep your hands under your shoulders, and keep your knees under your hips. You may place padding under your knees for comfort. Let your head hang down toward your chest. Contract your abdominal muscles and point your tailbone toward the floor so your lower back becomes rounded like the back of a cat. Hold this position for 5 seconds. Slowly lift your head, let your abdominal muscles relax, and point your tailbone up toward the ceiling so your back forms a sagging arch like the back of a cow. Hold this position for 5 seconds.  Press-ups Repeat these steps 5-10 times:  Lie on your abdomen (face-down) on a firm bed or the floor. Place your palms near your head, about shoulder-width apart. Keeping your back as relaxed as possible and keeping your hips on the floor, slowly straighten your arms to raise the top half of your body and lift your shoulders. Do not use your back muscles to raise your upper torso. You may adjust the placement of your hands to make yourself more comfortable. Hold this position for 5 seconds while you keep  your back relaxed. Slowly return to lying flat on the floor.  Bridges Repeat these steps 10 times: Lie on your back on a firm bed or the floor. Bend your knees so they are pointing toward the ceiling and your feet are flat on the floor. Your arms should be flat at your sides, next to your body. Tighten your buttocks muscles and lift your buttocks off the floor until your waist is at almost the same height as your knees. You should feel the muscles working in your buttocks and the back of your thighs. If you do not feel these muscles, slide your feet 1-2 inches (2.5-5 cm) farther away from your buttocks. Hold this position for 3-5 seconds. Slowly lower your hips to the starting position, and allow your buttocks muscles to relax completely. If this exercise is too easy, try doing it with your arms crossed over your chest. Abdominal crunches Repeat these steps 5-10 times: Lie on your back on a firm bed or the floor with your legs extended. Bend your knees so they are pointing toward the ceiling and your feet are flat on the floor. Cross your arms over your chest. Tip your chin slightly toward your chest without bending your neck. Tighten your abdominal muscles and slowly raise your torso high enough to lift your shoulder blades a tiny bit off the floor. Avoid raising your torso higher than that because it can put too much stress on your lower back and does not help to strengthen your abdominal muscles. Slowly return to your starting position.  Back lifts Repeat these steps 5-10 times: Lie on your abdomen (face-down) with your arms at your sides, and rest your forehead on the floor. Tighten the muscles in your legs and your buttocks. Slowly lift your chest off the floor while you keep your hips pressed to the floor. Keep the back of your head in line with the curve in your back. Your eyes should be looking at the floor. Hold this position for 3-5 seconds. Slowly return to your starting  position.  Contact a health care provider if: Your back pain or discomfort gets much worse when you do an exercise. Your worsening back pain or discomfort does not lessen within 2 hours after you exercise. If you have any of these problems, stop doing these exercises right away. Do not do them again unless your health care provider says that you can. Get help right away if: You develop sudden, severe back pain. If this happens, stop doing the exercises right away. Do not do them again unless your health care provider says that you can. This information is not intended to replace advice given to you by your health care provider. Make sure you discuss any questions you have with your health care provider. Document Revised: 08/13/2020 Document Reviewed: 05/01/2020 Elsevier Patient Education  [place].

## 2021-12-22 ENCOUNTER — Ambulatory Visit (INDEPENDENT_AMBULATORY_CARE_PROVIDER_SITE_OTHER): Payer: Medicare Other

## 2021-12-22 DIAGNOSIS — F411 Generalized anxiety disorder: Secondary | ICD-10-CM | POA: Diagnosis not present

## 2021-12-22 DIAGNOSIS — I639 Cerebral infarction, unspecified: Secondary | ICD-10-CM | POA: Diagnosis not present

## 2021-12-22 DIAGNOSIS — M545 Low back pain, unspecified: Secondary | ICD-10-CM | POA: Diagnosis not present

## 2021-12-22 DIAGNOSIS — E1169 Type 2 diabetes mellitus with other specified complication: Secondary | ICD-10-CM | POA: Diagnosis not present

## 2021-12-22 DIAGNOSIS — Z23 Encounter for immunization: Secondary | ICD-10-CM | POA: Diagnosis not present

## 2021-12-22 DIAGNOSIS — Z6831 Body mass index (BMI) 31.0-31.9, adult: Secondary | ICD-10-CM | POA: Diagnosis not present

## 2021-12-22 DIAGNOSIS — E7800 Pure hypercholesterolemia, unspecified: Secondary | ICD-10-CM | POA: Diagnosis not present

## 2021-12-22 LAB — CUP PACEART REMOTE DEVICE CHECK
Date Time Interrogation Session: 20231022230640
Implantable Pulse Generator Implant Date: 20220419

## 2022-01-05 DIAGNOSIS — N401 Enlarged prostate with lower urinary tract symptoms: Secondary | ICD-10-CM | POA: Diagnosis not present

## 2022-01-05 DIAGNOSIS — Z87442 Personal history of urinary calculi: Secondary | ICD-10-CM | POA: Diagnosis not present

## 2022-01-05 DIAGNOSIS — R300 Dysuria: Secondary | ICD-10-CM | POA: Diagnosis not present

## 2022-01-05 DIAGNOSIS — N281 Cyst of kidney, acquired: Secondary | ICD-10-CM | POA: Diagnosis not present

## 2022-01-05 DIAGNOSIS — R351 Nocturia: Secondary | ICD-10-CM | POA: Diagnosis not present

## 2022-01-05 DIAGNOSIS — R102 Pelvic and perineal pain: Secondary | ICD-10-CM | POA: Diagnosis not present

## 2022-01-09 ENCOUNTER — Ambulatory Visit (HOSPITAL_BASED_OUTPATIENT_CLINIC_OR_DEPARTMENT_OTHER)
Admission: RE | Admit: 2022-01-09 | Discharge: 2022-01-09 | Disposition: A | Discharge status: Home / Self Care | Payer: Medicare Other | Source: Ambulatory Visit | Attending: Neurology | Admitting: Neurology

## 2022-01-09 ENCOUNTER — Ambulatory Visit (HOSPITAL_COMMUNITY)
Admission: RE | Admit: 2022-01-09 | Discharge: 2022-01-09 | Disposition: A | Discharge status: Home / Self Care | Payer: Medicare Other | Source: Ambulatory Visit | Attending: Neurology | Admitting: Neurology

## 2022-01-09 DIAGNOSIS — I69398 Other sequelae of cerebral infarction: Secondary | ICD-10-CM | POA: Diagnosis not present

## 2022-01-09 DIAGNOSIS — R42 Dizziness and giddiness: Secondary | ICD-10-CM | POA: Diagnosis not present

## 2022-01-09 DIAGNOSIS — M7989 Other specified soft tissue disorders: Secondary | ICD-10-CM | POA: Diagnosis not present

## 2022-01-09 DIAGNOSIS — R269 Unspecified abnormalities of gait and mobility: Secondary | ICD-10-CM | POA: Insufficient documentation

## 2022-01-09 NOTE — Progress Notes (Signed)
Kindly inform the patient that lower extremity venous Doppler study did not show any blood clots in the legs on either side

## 2022-01-09 NOTE — Progress Notes (Signed)
Kindly inform the patient that carotid ultrasound study did not show any major blockages of either carotid arteries in the neck

## 2022-01-09 NOTE — Progress Notes (Signed)
Carotid artery duplex and bilateral lower extremity venous duplex has been completed. Preliminary results can be found in CV Proc through chart review.   01/09/22 1:24 PM Mitchell Lambert RVT

## 2022-01-12 NOTE — Progress Notes (Signed)
[facility] Summary Report / Loop Recorder 

## 2022-01-15 NOTE — Progress Notes (Signed)
Kindly inform the patient that carotid ultrasound study shows no significant narrowing of either carotid artery in the neck on both sides

## 2022-01-26 ENCOUNTER — Ambulatory Visit (INDEPENDENT_AMBULATORY_CARE_PROVIDER_SITE_OTHER): Payer: Medicare Other

## 2022-01-26 DIAGNOSIS — I639 Cerebral infarction, unspecified: Secondary | ICD-10-CM

## 2022-01-26 LAB — CUP PACEART REMOTE DEVICE CHECK
Date Time Interrogation Session: 20231126231109
Implantable Pulse Generator Implant Date: 20220419

## 2022-02-09 ENCOUNTER — Emergency Department (HOSPITAL_BASED_OUTPATIENT_CLINIC_OR_DEPARTMENT_OTHER)
Admission: EM | Admit: 2022-02-09 | Discharge: 2022-02-09 | Discharge status: Against Medical Advice | Payer: Medicare Other | Attending: Emergency Medicine | Admitting: Emergency Medicine

## 2022-02-09 ENCOUNTER — Emergency Department (HOSPITAL_BASED_OUTPATIENT_CLINIC_OR_DEPARTMENT_OTHER): Payer: Medicare Other | Admitting: Radiology

## 2022-02-09 ENCOUNTER — Encounter (HOSPITAL_BASED_OUTPATIENT_CLINIC_OR_DEPARTMENT_OTHER): Payer: Self-pay | Admitting: Emergency Medicine

## 2022-02-09 ENCOUNTER — Other Ambulatory Visit: Payer: Self-pay

## 2022-02-09 DIAGNOSIS — Z5321 Procedure and treatment not carried out due to patient leaving prior to being seen by health care provider: Secondary | ICD-10-CM | POA: Insufficient documentation

## 2022-02-09 DIAGNOSIS — M545 Low back pain, unspecified: Secondary | ICD-10-CM | POA: Insufficient documentation

## 2022-02-09 HISTORY — DX: Calculus of kidney: N20.0

## 2022-02-09 HISTORY — DX: Cerebral infarction, unspecified: I63.9

## 2022-02-09 LAB — URINALYSIS, ROUTINE W REFLEX MICROSCOPIC
Bilirubin Urine: NEGATIVE
Glucose, UA: NEGATIVE mg/dL
Hgb urine dipstick: NEGATIVE
Ketones, ur: NEGATIVE mg/dL
Leukocytes,Ua: NEGATIVE
Nitrite: NEGATIVE
Protein, ur: >300 mg/dL — AB
Specific Gravity, Urine: 1.021 (ref 1.005–1.030)
pH: 6.5 (ref 5.0–8.0)

## 2022-02-09 NOTE — ED Triage Notes (Signed)
Patient c/o sharp pain to right lower back x4 days. Increased pain with movement, sitting is the worst per patient. Denies any known injury.

## 2022-02-09 NOTE — ED Notes (Signed)
Registration stated that this pt has left.

## 2022-02-16 DIAGNOSIS — M5416 Radiculopathy, lumbar region: Secondary | ICD-10-CM | POA: Diagnosis not present

## 2022-02-17 ENCOUNTER — Other Ambulatory Visit: Payer: Self-pay | Admitting: Orthopedic Surgery

## 2022-02-17 DIAGNOSIS — M545 Low back pain, unspecified: Secondary | ICD-10-CM

## 2022-02-17 DIAGNOSIS — H40002 Preglaucoma, unspecified, left eye: Secondary | ICD-10-CM | POA: Diagnosis not present

## 2022-02-27 ENCOUNTER — Ambulatory Visit
Admission: RE | Admit: 2022-02-27 | Discharge: 2022-02-27 | Disposition: A | Discharge status: Home / Self Care | Payer: Medicare Other | Source: Ambulatory Visit | Attending: Orthopedic Surgery | Admitting: Orthopedic Surgery

## 2022-02-27 DIAGNOSIS — M545 Low back pain, unspecified: Secondary | ICD-10-CM

## 2022-02-27 DIAGNOSIS — M50222 Other cervical disc displacement at C5-C6 level: Secondary | ICD-10-CM | POA: Diagnosis not present

## 2022-03-03 ENCOUNTER — Ambulatory Visit (INDEPENDENT_AMBULATORY_CARE_PROVIDER_SITE_OTHER): Payer: Medicare Other

## 2022-03-03 DIAGNOSIS — I639 Cerebral infarction, unspecified: Secondary | ICD-10-CM | POA: Diagnosis not present

## 2022-03-03 LAB — CUP PACEART REMOTE DEVICE CHECK
Date Time Interrogation Session: 20231229230712
Implantable Pulse Generator Implant Date: 20220419

## 2022-03-06 NOTE — Progress Notes (Signed)
[facility] Summary Report / Loop Recorder 

## 2022-03-09 DIAGNOSIS — M5416 Radiculopathy, lumbar region: Secondary | ICD-10-CM | POA: Diagnosis not present

## 2022-03-10 ENCOUNTER — Other Ambulatory Visit: Payer: Self-pay | Admitting: Orthopedic Surgery

## 2022-03-10 DIAGNOSIS — M545 Low back pain, unspecified: Secondary | ICD-10-CM

## 2022-03-12 ENCOUNTER — Ambulatory Visit
Admission: RE | Admit: 2022-03-12 | Discharge: 2022-03-12 | Disposition: A | Discharge status: Home / Self Care | Payer: Medicare Other | Source: Ambulatory Visit | Attending: Orthopedic Surgery | Admitting: Orthopedic Surgery

## 2022-03-12 DIAGNOSIS — M47816 Spondylosis without myelopathy or radiculopathy, lumbar region: Secondary | ICD-10-CM | POA: Diagnosis not present

## 2022-03-12 DIAGNOSIS — M545 Low back pain, unspecified: Secondary | ICD-10-CM | POA: Diagnosis not present

## 2022-03-12 DIAGNOSIS — M48061 Spinal stenosis, lumbar region without neurogenic claudication: Secondary | ICD-10-CM | POA: Diagnosis not present

## 2022-03-17 DIAGNOSIS — M545 Low back pain, unspecified: Secondary | ICD-10-CM | POA: Diagnosis not present

## 2022-03-27 NOTE — Progress Notes (Signed)
Carelink Summary Report / Loop Recorder

## 2022-03-30 DIAGNOSIS — M47816 Spondylosis without myelopathy or radiculopathy, lumbar region: Secondary | ICD-10-CM | POA: Diagnosis not present

## 2022-04-02 LAB — CUP PACEART REMOTE DEVICE CHECK
Date Time Interrogation Session: 20240131230833
Implantable Pulse Generator Implant Date: 20220419

## 2022-04-06 ENCOUNTER — Ambulatory Visit: Payer: Medicare Other

## 2022-04-06 DIAGNOSIS — I639 Cerebral infarction, unspecified: Secondary | ICD-10-CM

## 2022-04-07 DIAGNOSIS — M47816 Spondylosis without myelopathy or radiculopathy, lumbar region: Secondary | ICD-10-CM | POA: Diagnosis not present

## 2022-05-11 ENCOUNTER — Ambulatory Visit: Payer: Medicare Other

## 2022-05-11 DIAGNOSIS — I639 Cerebral infarction, unspecified: Secondary | ICD-10-CM

## 2022-05-11 LAB — CUP PACEART REMOTE DEVICE CHECK
Date Time Interrogation Session: 20240310230543
Implantable Pulse Generator Implant Date: 20220419

## 2022-05-15 NOTE — Progress Notes (Signed)
[facility] Summary Report / Loop Recorder 

## 2022-05-27 DIAGNOSIS — H40002 Preglaucoma, unspecified, left eye: Secondary | ICD-10-CM | POA: Diagnosis not present

## 2022-06-01 DIAGNOSIS — M5416 Radiculopathy, lumbar region: Secondary | ICD-10-CM | POA: Diagnosis not present

## 2022-06-15 ENCOUNTER — Ambulatory Visit: Payer: Medicare Other

## 2022-06-15 DIAGNOSIS — I639 Cerebral infarction, unspecified: Secondary | ICD-10-CM

## 2022-06-15 LAB — CUP PACEART REMOTE DEVICE CHECK
Date Time Interrogation Session: 20240414230643
Implantable Pulse Generator Implant Date: 20220419

## 2022-06-16 ENCOUNTER — Telehealth: Payer: Self-pay | Admitting: Neurology

## 2022-06-16 NOTE — Progress Notes (Signed)
[facility] Summary Report / Loop Recorder 

## 2022-06-16 NOTE — Telephone Encounter (Signed)
Mitchell Lambert called from [employer]. Stated she is following-up on medical clearance form.

## 2022-06-16 NOTE — Telephone Encounter (Signed)
This pt last seen 11-2021 return as needed.   No scheduled appt.   Plavix from other provider (as our last fill 2022).  Do we want appt for this pt or you ok to clear?

## 2022-06-22 DIAGNOSIS — M5416 Radiculopathy, lumbar region: Secondary | ICD-10-CM | POA: Diagnosis not present

## 2022-06-23 NOTE — Telephone Encounter (Signed)
This form has been completed and faxed to them. Received confirmation that it went through on 4/18

## 2022-06-29 DIAGNOSIS — M5416 Radiculopathy, lumbar region: Secondary | ICD-10-CM | POA: Diagnosis not present

## 2022-06-29 DIAGNOSIS — G894 Chronic pain syndrome: Secondary | ICD-10-CM | POA: Diagnosis not present

## 2022-06-29 DIAGNOSIS — M47816 Spondylosis without myelopathy or radiculopathy, lumbar region: Secondary | ICD-10-CM | POA: Diagnosis not present

## 2022-06-29 DIAGNOSIS — M48061 Spinal stenosis, lumbar region without neurogenic claudication: Secondary | ICD-10-CM | POA: Diagnosis not present

## 2022-06-30 DIAGNOSIS — Z79891 Long term (current) use of opiate analgesic: Secondary | ICD-10-CM | POA: Diagnosis not present

## 2022-06-30 DIAGNOSIS — G894 Chronic pain syndrome: Secondary | ICD-10-CM | POA: Diagnosis not present

## 2022-07-08 DIAGNOSIS — E7800 Pure hypercholesterolemia, unspecified: Secondary | ICD-10-CM | POA: Diagnosis not present

## 2022-07-08 DIAGNOSIS — I10 Essential (primary) hypertension: Secondary | ICD-10-CM | POA: Diagnosis not present

## 2022-07-08 DIAGNOSIS — Z125 Encounter for screening for malignant neoplasm of prostate: Secondary | ICD-10-CM | POA: Diagnosis not present

## 2022-07-08 DIAGNOSIS — M109 Gout, unspecified: Secondary | ICD-10-CM | POA: Diagnosis not present

## 2022-07-08 DIAGNOSIS — E1169 Type 2 diabetes mellitus with other specified complication: Secondary | ICD-10-CM | POA: Diagnosis not present

## 2022-07-13 DIAGNOSIS — Z6831 Body mass index (BMI) 31.0-31.9, adult: Secondary | ICD-10-CM | POA: Diagnosis not present

## 2022-07-13 DIAGNOSIS — Z794 Long term (current) use of insulin: Secondary | ICD-10-CM | POA: Diagnosis not present

## 2022-07-13 DIAGNOSIS — I10 Essential (primary) hypertension: Secondary | ICD-10-CM | POA: Diagnosis not present

## 2022-07-13 DIAGNOSIS — E7800 Pure hypercholesterolemia, unspecified: Secondary | ICD-10-CM | POA: Diagnosis not present

## 2022-07-13 DIAGNOSIS — E1169 Type 2 diabetes mellitus with other specified complication: Secondary | ICD-10-CM | POA: Diagnosis not present

## 2022-07-13 DIAGNOSIS — M109 Gout, unspecified: Secondary | ICD-10-CM | POA: Diagnosis not present

## 2022-07-13 DIAGNOSIS — I69359 Hemiplegia and hemiparesis following cerebral infarction affecting unspecified side: Secondary | ICD-10-CM | POA: Diagnosis not present

## 2022-07-13 DIAGNOSIS — J45991 Cough variant asthma: Secondary | ICD-10-CM | POA: Diagnosis not present

## 2022-07-13 DIAGNOSIS — N183 Chronic kidney disease, stage 3 unspecified: Secondary | ICD-10-CM | POA: Diagnosis not present

## 2022-07-13 DIAGNOSIS — F411 Generalized anxiety disorder: Secondary | ICD-10-CM | POA: Diagnosis not present

## 2022-07-13 DIAGNOSIS — Z0000 Encounter for general adult medical examination without abnormal findings: Secondary | ICD-10-CM | POA: Diagnosis not present

## 2022-07-16 NOTE — Progress Notes (Signed)
[facility] Summary Report / Loop Recorder 

## 2022-07-20 ENCOUNTER — Ambulatory Visit (INDEPENDENT_AMBULATORY_CARE_PROVIDER_SITE_OTHER): Payer: Medicare Other

## 2022-07-20 DIAGNOSIS — I639 Cerebral infarction, unspecified: Secondary | ICD-10-CM

## 2022-07-20 LAB — CUP PACEART REMOTE DEVICE CHECK
Date Time Interrogation Session: 20240517230104
Implantable Pulse Generator Implant Date: 20220419

## 2022-07-22 DIAGNOSIS — H40002 Preglaucoma, unspecified, left eye: Secondary | ICD-10-CM | POA: Diagnosis not present

## 2022-08-14 NOTE — Progress Notes (Signed)
[facility] Summary Report / Loop Recorder 

## 2022-08-21 DIAGNOSIS — M48061 Spinal stenosis, lumbar region without neurogenic claudication: Secondary | ICD-10-CM | POA: Diagnosis not present

## 2022-08-21 DIAGNOSIS — G894 Chronic pain syndrome: Secondary | ICD-10-CM | POA: Diagnosis not present

## 2022-08-21 DIAGNOSIS — M47816 Spondylosis without myelopathy or radiculopathy, lumbar region: Secondary | ICD-10-CM | POA: Diagnosis not present

## 2022-08-21 DIAGNOSIS — M5416 Radiculopathy, lumbar region: Secondary | ICD-10-CM | POA: Diagnosis not present

## 2022-08-24 ENCOUNTER — Ambulatory Visit (INDEPENDENT_AMBULATORY_CARE_PROVIDER_SITE_OTHER): Payer: Medicare Other

## 2022-08-24 DIAGNOSIS — I639 Cerebral infarction, unspecified: Secondary | ICD-10-CM

## 2022-08-24 LAB — CUP PACEART REMOTE DEVICE CHECK
Date Time Interrogation Session: 20240623230424
Implantable Pulse Generator Implant Date: 20220419

## 2022-09-10 NOTE — Progress Notes (Signed)
[facility] Summary Report / Loop Recorder 

## 2022-09-28 ENCOUNTER — Ambulatory Visit: Payer: Medicare Other

## 2022-09-28 DIAGNOSIS — I639 Cerebral infarction, unspecified: Secondary | ICD-10-CM | POA: Diagnosis not present

## 2022-10-06 DIAGNOSIS — M4726 Other spondylosis with radiculopathy, lumbar region: Secondary | ICD-10-CM | POA: Diagnosis not present

## 2022-10-06 DIAGNOSIS — M47816 Spondylosis without myelopathy or radiculopathy, lumbar region: Secondary | ICD-10-CM | POA: Diagnosis not present

## 2022-10-06 DIAGNOSIS — G894 Chronic pain syndrome: Secondary | ICD-10-CM | POA: Diagnosis not present

## 2022-10-06 DIAGNOSIS — M48061 Spinal stenosis, lumbar region without neurogenic claudication: Secondary | ICD-10-CM | POA: Diagnosis not present

## 2022-10-13 NOTE — Progress Notes (Signed)
[facility] Summary Report / Loop Recorder 

## 2022-10-22 DIAGNOSIS — H40002 Preglaucoma, unspecified, left eye: Secondary | ICD-10-CM | POA: Diagnosis not present

## 2022-10-29 LAB — CUP PACEART REMOTE DEVICE CHECK
Date Time Interrogation Session: 20240828230637
Implantable Pulse Generator Implant Date: 20220419

## 2022-11-03 ENCOUNTER — Ambulatory Visit (INDEPENDENT_AMBULATORY_CARE_PROVIDER_SITE_OTHER): Payer: Medicare Other

## 2022-11-03 DIAGNOSIS — I495 Sick sinus syndrome: Secondary | ICD-10-CM

## 2022-11-10 NOTE — Progress Notes (Signed)
[facility] Summary Report / Loop Recorder 

## 2022-11-18 ENCOUNTER — Ambulatory Visit
Admission: RE | Admit: 2022-11-18 | Discharge: 2022-11-18 | Disposition: A | Discharge status: Home / Self Care | Payer: Medicare Other | Source: Ambulatory Visit | Attending: Physical Medicine and Rehabilitation | Admitting: Physical Medicine and Rehabilitation

## 2022-11-18 ENCOUNTER — Other Ambulatory Visit: Payer: Self-pay | Admitting: Physical Medicine and Rehabilitation

## 2022-11-18 DIAGNOSIS — M25552 Pain in left hip: Secondary | ICD-10-CM | POA: Diagnosis not present

## 2022-11-18 DIAGNOSIS — M25551 Pain in right hip: Secondary | ICD-10-CM

## 2022-11-30 DIAGNOSIS — G894 Chronic pain syndrome: Secondary | ICD-10-CM | POA: Diagnosis not present

## 2022-11-30 DIAGNOSIS — M5416 Radiculopathy, lumbar region: Secondary | ICD-10-CM | POA: Diagnosis not present

## 2022-11-30 DIAGNOSIS — M48061 Spinal stenosis, lumbar region without neurogenic claudication: Secondary | ICD-10-CM | POA: Diagnosis not present

## 2022-11-30 DIAGNOSIS — M47816 Spondylosis without myelopathy or radiculopathy, lumbar region: Secondary | ICD-10-CM | POA: Diagnosis not present

## 2022-12-01 LAB — CUP PACEART REMOTE DEVICE CHECK
Date Time Interrogation Session: 20240930231113
Implantable Pulse Generator Implant Date: 20220419

## 2022-12-07 ENCOUNTER — Ambulatory Visit (INDEPENDENT_AMBULATORY_CARE_PROVIDER_SITE_OTHER): Payer: Medicare Other

## 2022-12-07 DIAGNOSIS — I639 Cerebral infarction, unspecified: Secondary | ICD-10-CM

## 2022-12-18 NOTE — Progress Notes (Signed)
[facility] Summary Report / Loop Recorder 

## 2023-01-04 LAB — CUP PACEART REMOTE DEVICE CHECK
Date Time Interrogation Session: 20241102230149
Implantable Pulse Generator Implant Date: 20220419

## 2023-01-05 DIAGNOSIS — Z23 Encounter for immunization: Secondary | ICD-10-CM | POA: Diagnosis not present

## 2023-01-05 DIAGNOSIS — E1169 Type 2 diabetes mellitus with other specified complication: Secondary | ICD-10-CM | POA: Diagnosis not present

## 2023-01-05 DIAGNOSIS — Z794 Long term (current) use of insulin: Secondary | ICD-10-CM | POA: Diagnosis not present

## 2023-01-05 DIAGNOSIS — I69359 Hemiplegia and hemiparesis following cerebral infarction affecting unspecified side: Secondary | ICD-10-CM | POA: Diagnosis not present

## 2023-01-05 DIAGNOSIS — Z6831 Body mass index (BMI) 31.0-31.9, adult: Secondary | ICD-10-CM | POA: Diagnosis not present

## 2023-01-05 DIAGNOSIS — F411 Generalized anxiety disorder: Secondary | ICD-10-CM | POA: Diagnosis not present

## 2023-01-05 DIAGNOSIS — E162 Hypoglycemia, unspecified: Secondary | ICD-10-CM | POA: Diagnosis not present

## 2023-01-11 ENCOUNTER — Ambulatory Visit (INDEPENDENT_AMBULATORY_CARE_PROVIDER_SITE_OTHER): Payer: Medicare Other

## 2023-01-11 DIAGNOSIS — I639 Cerebral infarction, unspecified: Secondary | ICD-10-CM

## 2023-02-03 NOTE — Progress Notes (Signed)
[facility] Summary Report / Loop Recorder 

## 2023-02-05 ENCOUNTER — Ambulatory Visit: Payer: Medicare Other

## 2023-02-05 DIAGNOSIS — I495 Sick sinus syndrome: Secondary | ICD-10-CM | POA: Diagnosis not present

## 2023-02-05 LAB — CUP PACEART REMOTE DEVICE CHECK
Date Time Interrogation Session: 20241205230333
Implantable Pulse Generator Implant Date: 20220419

## 2023-02-09 DIAGNOSIS — Z961 Presence of intraocular lens: Secondary | ICD-10-CM | POA: Diagnosis not present

## 2023-02-09 DIAGNOSIS — H40002 Preglaucoma, unspecified, left eye: Secondary | ICD-10-CM | POA: Diagnosis not present

## 2023-02-10 DIAGNOSIS — M47816 Spondylosis without myelopathy or radiculopathy, lumbar region: Secondary | ICD-10-CM | POA: Diagnosis not present

## 2023-02-10 DIAGNOSIS — M48061 Spinal stenosis, lumbar region without neurogenic claudication: Secondary | ICD-10-CM | POA: Diagnosis not present

## 2023-02-10 DIAGNOSIS — M5416 Radiculopathy, lumbar region: Secondary | ICD-10-CM | POA: Diagnosis not present

## 2023-02-10 DIAGNOSIS — G894 Chronic pain syndrome: Secondary | ICD-10-CM | POA: Diagnosis not present

## 2023-02-15 ENCOUNTER — Ambulatory Visit: Payer: Medicare Other

## 2023-03-11 ENCOUNTER — Encounter (INDEPENDENT_AMBULATORY_CARE_PROVIDER_SITE_OTHER): Payer: Medicare Other | Admitting: Ophthalmology

## 2023-03-11 DIAGNOSIS — H43813 Vitreous degeneration, bilateral: Secondary | ICD-10-CM

## 2023-03-11 DIAGNOSIS — H35033 Hypertensive retinopathy, bilateral: Secondary | ICD-10-CM

## 2023-03-11 DIAGNOSIS — I10 Essential (primary) hypertension: Secondary | ICD-10-CM | POA: Diagnosis not present

## 2023-03-11 DIAGNOSIS — E113313 Type 2 diabetes mellitus with moderate nonproliferative diabetic retinopathy with macular edema, bilateral: Secondary | ICD-10-CM | POA: Diagnosis not present

## 2023-03-11 DIAGNOSIS — Z794 Long term (current) use of insulin: Secondary | ICD-10-CM

## 2023-03-12 ENCOUNTER — Ambulatory Visit: Payer: Medicare Other

## 2023-03-12 DIAGNOSIS — I495 Sick sinus syndrome: Secondary | ICD-10-CM

## 2023-03-12 LAB — CUP PACEART REMOTE DEVICE CHECK
Date Time Interrogation Session: 20250109230424
Implantable Pulse Generator Implant Date: 20220419

## 2023-03-22 ENCOUNTER — Ambulatory Visit: Payer: Medicare Other

## 2023-04-07 ENCOUNTER — Encounter (INDEPENDENT_AMBULATORY_CARE_PROVIDER_SITE_OTHER): Payer: Medicare Other | Admitting: Ophthalmology

## 2023-04-07 DIAGNOSIS — I10 Essential (primary) hypertension: Secondary | ICD-10-CM | POA: Diagnosis not present

## 2023-04-07 DIAGNOSIS — Z794 Long term (current) use of insulin: Secondary | ICD-10-CM

## 2023-04-07 DIAGNOSIS — E113312 Type 2 diabetes mellitus with moderate nonproliferative diabetic retinopathy with macular edema, left eye: Secondary | ICD-10-CM | POA: Diagnosis not present

## 2023-04-07 DIAGNOSIS — H43813 Vitreous degeneration, bilateral: Secondary | ICD-10-CM

## 2023-04-07 DIAGNOSIS — H35033 Hypertensive retinopathy, bilateral: Secondary | ICD-10-CM | POA: Diagnosis not present

## 2023-04-07 DIAGNOSIS — H35371 Puckering of macula, right eye: Secondary | ICD-10-CM

## 2023-04-16 ENCOUNTER — Ambulatory Visit: Payer: Medicare Other

## 2023-04-16 DIAGNOSIS — I495 Sick sinus syndrome: Secondary | ICD-10-CM | POA: Diagnosis not present

## 2023-04-16 LAB — CUP PACEART REMOTE DEVICE CHECK
Date Time Interrogation Session: 20250213230249
Implantable Pulse Generator Implant Date: 20220419

## 2023-04-16 NOTE — Progress Notes (Signed)
Carelink Summary Report / Loop Recorder

## 2023-04-17 ENCOUNTER — Encounter: Payer: Self-pay | Admitting: Cardiology

## 2023-04-26 ENCOUNTER — Ambulatory Visit: Payer: Medicare Other

## 2023-05-05 ENCOUNTER — Encounter (INDEPENDENT_AMBULATORY_CARE_PROVIDER_SITE_OTHER): Payer: Medicare Other | Admitting: Ophthalmology

## 2023-05-05 DIAGNOSIS — E113291 Type 2 diabetes mellitus with mild nonproliferative diabetic retinopathy without macular edema, right eye: Secondary | ICD-10-CM | POA: Diagnosis not present

## 2023-05-05 DIAGNOSIS — E113312 Type 2 diabetes mellitus with moderate nonproliferative diabetic retinopathy with macular edema, left eye: Secondary | ICD-10-CM

## 2023-05-05 DIAGNOSIS — Z794 Long term (current) use of insulin: Secondary | ICD-10-CM | POA: Diagnosis not present

## 2023-05-05 DIAGNOSIS — H43813 Vitreous degeneration, bilateral: Secondary | ICD-10-CM

## 2023-05-05 DIAGNOSIS — I10 Essential (primary) hypertension: Secondary | ICD-10-CM

## 2023-05-05 DIAGNOSIS — H35033 Hypertensive retinopathy, bilateral: Secondary | ICD-10-CM

## 2023-05-10 DIAGNOSIS — N183 Chronic kidney disease, stage 3 unspecified: Secondary | ICD-10-CM | POA: Diagnosis not present

## 2023-05-10 DIAGNOSIS — M5416 Radiculopathy, lumbar region: Secondary | ICD-10-CM | POA: Diagnosis not present

## 2023-05-10 DIAGNOSIS — M48061 Spinal stenosis, lumbar region without neurogenic claudication: Secondary | ICD-10-CM | POA: Diagnosis not present

## 2023-05-10 DIAGNOSIS — I10 Essential (primary) hypertension: Secondary | ICD-10-CM | POA: Diagnosis not present

## 2023-05-10 DIAGNOSIS — E1169 Type 2 diabetes mellitus with other specified complication: Secondary | ICD-10-CM | POA: Diagnosis not present

## 2023-05-10 DIAGNOSIS — M47816 Spondylosis without myelopathy or radiculopathy, lumbar region: Secondary | ICD-10-CM | POA: Diagnosis not present

## 2023-05-10 DIAGNOSIS — G894 Chronic pain syndrome: Secondary | ICD-10-CM | POA: Diagnosis not present

## 2023-05-14 NOTE — Progress Notes (Signed)
 [facility]link Summary Report / Loop Recorder

## 2023-05-21 ENCOUNTER — Ambulatory Visit: Payer: Medicare Other

## 2023-05-21 DIAGNOSIS — I495 Sick sinus syndrome: Secondary | ICD-10-CM | POA: Diagnosis not present

## 2023-05-21 LAB — CUP PACEART REMOTE DEVICE CHECK
Date Time Interrogation Session: 20250320230408
Implantable Pulse Generator Implant Date: 20220419

## 2023-05-23 ENCOUNTER — Encounter: Payer: Self-pay | Admitting: Cardiology

## 2023-05-31 DIAGNOSIS — N183 Chronic kidney disease, stage 3 unspecified: Secondary | ICD-10-CM | POA: Diagnosis not present

## 2023-05-31 DIAGNOSIS — I10 Essential (primary) hypertension: Secondary | ICD-10-CM | POA: Diagnosis not present

## 2023-05-31 DIAGNOSIS — E7800 Pure hypercholesterolemia, unspecified: Secondary | ICD-10-CM | POA: Diagnosis not present

## 2023-05-31 DIAGNOSIS — E1169 Type 2 diabetes mellitus with other specified complication: Secondary | ICD-10-CM | POA: Diagnosis not present

## 2023-05-31 DIAGNOSIS — J45991 Cough variant asthma: Secondary | ICD-10-CM | POA: Diagnosis not present

## 2023-06-03 ENCOUNTER — Encounter (INDEPENDENT_AMBULATORY_CARE_PROVIDER_SITE_OTHER): Admitting: Ophthalmology

## 2023-06-03 DIAGNOSIS — I10 Essential (primary) hypertension: Secondary | ICD-10-CM

## 2023-06-03 DIAGNOSIS — H35033 Hypertensive retinopathy, bilateral: Secondary | ICD-10-CM | POA: Diagnosis not present

## 2023-06-03 DIAGNOSIS — E113312 Type 2 diabetes mellitus with moderate nonproliferative diabetic retinopathy with macular edema, left eye: Secondary | ICD-10-CM | POA: Diagnosis not present

## 2023-06-03 DIAGNOSIS — H43813 Vitreous degeneration, bilateral: Secondary | ICD-10-CM

## 2023-06-03 DIAGNOSIS — Z794 Long term (current) use of insulin: Secondary | ICD-10-CM | POA: Diagnosis not present

## 2023-06-03 DIAGNOSIS — E113391 Type 2 diabetes mellitus with moderate nonproliferative diabetic retinopathy without macular edema, right eye: Secondary | ICD-10-CM

## 2023-06-08 DIAGNOSIS — N183 Chronic kidney disease, stage 3 unspecified: Secondary | ICD-10-CM | POA: Diagnosis not present

## 2023-06-08 DIAGNOSIS — E1169 Type 2 diabetes mellitus with other specified complication: Secondary | ICD-10-CM | POA: Diagnosis not present

## 2023-06-08 DIAGNOSIS — I10 Essential (primary) hypertension: Secondary | ICD-10-CM | POA: Diagnosis not present

## 2023-06-25 ENCOUNTER — Ambulatory Visit (INDEPENDENT_AMBULATORY_CARE_PROVIDER_SITE_OTHER): Payer: Medicare Other

## 2023-06-25 DIAGNOSIS — I495 Sick sinus syndrome: Secondary | ICD-10-CM

## 2023-06-25 LAB — CUP PACEART REMOTE DEVICE CHECK
Date Time Interrogation Session: 20250424230550
Implantable Pulse Generator Implant Date: 20220419

## 2023-06-27 ENCOUNTER — Encounter: Payer: Self-pay | Admitting: Cardiology

## 2023-06-29 NOTE — Progress Notes (Signed)
 [facility]link Summary Report / Loop Recorder

## 2023-06-30 DIAGNOSIS — N183 Chronic kidney disease, stage 3 unspecified: Secondary | ICD-10-CM | POA: Diagnosis not present

## 2023-06-30 DIAGNOSIS — E1169 Type 2 diabetes mellitus with other specified complication: Secondary | ICD-10-CM | POA: Diagnosis not present

## 2023-06-30 DIAGNOSIS — E7800 Pure hypercholesterolemia, unspecified: Secondary | ICD-10-CM | POA: Diagnosis not present

## 2023-06-30 DIAGNOSIS — I10 Essential (primary) hypertension: Secondary | ICD-10-CM | POA: Diagnosis not present

## 2023-06-30 DIAGNOSIS — J45991 Cough variant asthma: Secondary | ICD-10-CM | POA: Diagnosis not present

## 2023-07-01 ENCOUNTER — Encounter (INDEPENDENT_AMBULATORY_CARE_PROVIDER_SITE_OTHER): Admitting: Ophthalmology

## 2023-07-01 DIAGNOSIS — H35033 Hypertensive retinopathy, bilateral: Secondary | ICD-10-CM | POA: Diagnosis not present

## 2023-07-01 DIAGNOSIS — H43813 Vitreous degeneration, bilateral: Secondary | ICD-10-CM

## 2023-07-01 DIAGNOSIS — I10 Essential (primary) hypertension: Secondary | ICD-10-CM | POA: Diagnosis not present

## 2023-07-01 DIAGNOSIS — Z794 Long term (current) use of insulin: Secondary | ICD-10-CM

## 2023-07-01 DIAGNOSIS — E113313 Type 2 diabetes mellitus with moderate nonproliferative diabetic retinopathy with macular edema, bilateral: Secondary | ICD-10-CM

## 2023-07-05 DIAGNOSIS — M48061 Spinal stenosis, lumbar region without neurogenic claudication: Secondary | ICD-10-CM | POA: Diagnosis not present

## 2023-07-05 DIAGNOSIS — G894 Chronic pain syndrome: Secondary | ICD-10-CM | POA: Diagnosis not present

## 2023-07-05 DIAGNOSIS — M47816 Spondylosis without myelopathy or radiculopathy, lumbar region: Secondary | ICD-10-CM | POA: Diagnosis not present

## 2023-07-05 DIAGNOSIS — M5416 Radiculopathy, lumbar region: Secondary | ICD-10-CM | POA: Diagnosis not present

## 2023-07-08 DIAGNOSIS — I10 Essential (primary) hypertension: Secondary | ICD-10-CM | POA: Diagnosis not present

## 2023-07-08 DIAGNOSIS — N183 Chronic kidney disease, stage 3 unspecified: Secondary | ICD-10-CM | POA: Diagnosis not present

## 2023-07-08 DIAGNOSIS — E1169 Type 2 diabetes mellitus with other specified complication: Secondary | ICD-10-CM | POA: Diagnosis not present

## 2023-07-19 DIAGNOSIS — E7800 Pure hypercholesterolemia, unspecified: Secondary | ICD-10-CM | POA: Diagnosis not present

## 2023-07-19 DIAGNOSIS — M109 Gout, unspecified: Secondary | ICD-10-CM | POA: Diagnosis not present

## 2023-07-19 DIAGNOSIS — I69359 Hemiplegia and hemiparesis following cerebral infarction affecting unspecified side: Secondary | ICD-10-CM | POA: Diagnosis not present

## 2023-07-19 DIAGNOSIS — I10 Essential (primary) hypertension: Secondary | ICD-10-CM | POA: Diagnosis not present

## 2023-07-19 DIAGNOSIS — Z125 Encounter for screening for malignant neoplasm of prostate: Secondary | ICD-10-CM | POA: Diagnosis not present

## 2023-07-20 DIAGNOSIS — M109 Gout, unspecified: Secondary | ICD-10-CM | POA: Diagnosis not present

## 2023-07-20 DIAGNOSIS — I69359 Hemiplegia and hemiparesis following cerebral infarction affecting unspecified side: Secondary | ICD-10-CM | POA: Diagnosis not present

## 2023-07-20 DIAGNOSIS — K219 Gastro-esophageal reflux disease without esophagitis: Secondary | ICD-10-CM | POA: Diagnosis not present

## 2023-07-20 DIAGNOSIS — E7800 Pure hypercholesterolemia, unspecified: Secondary | ICD-10-CM | POA: Diagnosis not present

## 2023-07-20 DIAGNOSIS — Z6832 Body mass index (BMI) 32.0-32.9, adult: Secondary | ICD-10-CM | POA: Diagnosis not present

## 2023-07-20 DIAGNOSIS — Z794 Long term (current) use of insulin: Secondary | ICD-10-CM | POA: Diagnosis not present

## 2023-07-20 DIAGNOSIS — F411 Generalized anxiety disorder: Secondary | ICD-10-CM | POA: Diagnosis not present

## 2023-07-20 DIAGNOSIS — E669 Obesity, unspecified: Secondary | ICD-10-CM | POA: Diagnosis not present

## 2023-07-20 DIAGNOSIS — E1169 Type 2 diabetes mellitus with other specified complication: Secondary | ICD-10-CM | POA: Diagnosis not present

## 2023-07-20 DIAGNOSIS — Z0000 Encounter for general adult medical examination without abnormal findings: Secondary | ICD-10-CM | POA: Diagnosis not present

## 2023-07-20 DIAGNOSIS — I10 Essential (primary) hypertension: Secondary | ICD-10-CM | POA: Diagnosis not present

## 2023-07-20 DIAGNOSIS — J45991 Cough variant asthma: Secondary | ICD-10-CM | POA: Diagnosis not present

## 2023-07-29 ENCOUNTER — Encounter (INDEPENDENT_AMBULATORY_CARE_PROVIDER_SITE_OTHER): Admitting: Ophthalmology

## 2023-07-29 DIAGNOSIS — H43813 Vitreous degeneration, bilateral: Secondary | ICD-10-CM | POA: Diagnosis not present

## 2023-07-29 DIAGNOSIS — E113313 Type 2 diabetes mellitus with moderate nonproliferative diabetic retinopathy with macular edema, bilateral: Secondary | ICD-10-CM

## 2023-07-29 DIAGNOSIS — I10 Essential (primary) hypertension: Secondary | ICD-10-CM | POA: Diagnosis not present

## 2023-07-29 DIAGNOSIS — Z794 Long term (current) use of insulin: Secondary | ICD-10-CM

## 2023-07-29 DIAGNOSIS — H35033 Hypertensive retinopathy, bilateral: Secondary | ICD-10-CM | POA: Diagnosis not present

## 2023-07-30 ENCOUNTER — Ambulatory Visit (INDEPENDENT_AMBULATORY_CARE_PROVIDER_SITE_OTHER): Payer: Medicare Other

## 2023-07-30 DIAGNOSIS — I495 Sick sinus syndrome: Secondary | ICD-10-CM | POA: Diagnosis not present

## 2023-07-30 LAB — CUP PACEART REMOTE DEVICE CHECK
Date Time Interrogation Session: 20250529231512
Implantable Pulse Generator Implant Date: 20220419

## 2023-07-31 DIAGNOSIS — E1169 Type 2 diabetes mellitus with other specified complication: Secondary | ICD-10-CM | POA: Diagnosis not present

## 2023-07-31 DIAGNOSIS — E7800 Pure hypercholesterolemia, unspecified: Secondary | ICD-10-CM | POA: Diagnosis not present

## 2023-07-31 DIAGNOSIS — I10 Essential (primary) hypertension: Secondary | ICD-10-CM | POA: Diagnosis not present

## 2023-07-31 DIAGNOSIS — J45991 Cough variant asthma: Secondary | ICD-10-CM | POA: Diagnosis not present

## 2023-07-31 DIAGNOSIS — N183 Chronic kidney disease, stage 3 unspecified: Secondary | ICD-10-CM | POA: Diagnosis not present

## 2023-08-02 ENCOUNTER — Ambulatory Visit: Payer: Self-pay | Admitting: Cardiology

## 2023-08-02 NOTE — Progress Notes (Signed)
 [facility]link Summary Report / Loop Recorder

## 2023-08-07 DIAGNOSIS — I10 Essential (primary) hypertension: Secondary | ICD-10-CM | POA: Diagnosis not present

## 2023-08-07 DIAGNOSIS — E1169 Type 2 diabetes mellitus with other specified complication: Secondary | ICD-10-CM | POA: Diagnosis not present

## 2023-08-07 DIAGNOSIS — N183 Chronic kidney disease, stage 3 unspecified: Secondary | ICD-10-CM | POA: Diagnosis not present

## 2023-08-26 ENCOUNTER — Encounter (INDEPENDENT_AMBULATORY_CARE_PROVIDER_SITE_OTHER): Admitting: Ophthalmology

## 2023-08-30 ENCOUNTER — Ambulatory Visit (INDEPENDENT_AMBULATORY_CARE_PROVIDER_SITE_OTHER)

## 2023-08-30 DIAGNOSIS — M47816 Spondylosis without myelopathy or radiculopathy, lumbar region: Secondary | ICD-10-CM | POA: Diagnosis not present

## 2023-08-30 DIAGNOSIS — G894 Chronic pain syndrome: Secondary | ICD-10-CM | POA: Diagnosis not present

## 2023-08-30 DIAGNOSIS — M5416 Radiculopathy, lumbar region: Secondary | ICD-10-CM | POA: Diagnosis not present

## 2023-08-30 DIAGNOSIS — I639 Cerebral infarction, unspecified: Secondary | ICD-10-CM

## 2023-08-30 DIAGNOSIS — M48061 Spinal stenosis, lumbar region without neurogenic claudication: Secondary | ICD-10-CM | POA: Diagnosis not present

## 2023-08-30 LAB — CUP PACEART REMOTE DEVICE CHECK
Date Time Interrogation Session: 20250629231950
Implantable Pulse Generator Implant Date: 20220419

## 2023-09-01 ENCOUNTER — Ambulatory Visit: Payer: Self-pay | Admitting: Cardiology

## 2023-09-01 ENCOUNTER — Encounter (INDEPENDENT_AMBULATORY_CARE_PROVIDER_SITE_OTHER): Admitting: Ophthalmology

## 2023-09-01 DIAGNOSIS — Z794 Long term (current) use of insulin: Secondary | ICD-10-CM | POA: Diagnosis not present

## 2023-09-01 DIAGNOSIS — H35033 Hypertensive retinopathy, bilateral: Secondary | ICD-10-CM | POA: Diagnosis not present

## 2023-09-01 DIAGNOSIS — H43813 Vitreous degeneration, bilateral: Secondary | ICD-10-CM | POA: Diagnosis not present

## 2023-09-01 DIAGNOSIS — I10 Essential (primary) hypertension: Secondary | ICD-10-CM | POA: Diagnosis not present

## 2023-09-01 DIAGNOSIS — E113313 Type 2 diabetes mellitus with moderate nonproliferative diabetic retinopathy with macular edema, bilateral: Secondary | ICD-10-CM

## 2023-09-06 ENCOUNTER — Ambulatory Visit: Payer: Medicare Other

## 2023-09-06 DIAGNOSIS — N183 Chronic kidney disease, stage 3 unspecified: Secondary | ICD-10-CM | POA: Diagnosis not present

## 2023-09-06 DIAGNOSIS — E1169 Type 2 diabetes mellitus with other specified complication: Secondary | ICD-10-CM | POA: Diagnosis not present

## 2023-09-06 DIAGNOSIS — I10 Essential (primary) hypertension: Secondary | ICD-10-CM | POA: Diagnosis not present

## 2023-09-14 NOTE — Progress Notes (Signed)
 [facility]link Summary Report / Loop Recorder

## 2023-09-29 NOTE — Progress Notes (Signed)
 [facility]link Summary Report / Loop Recorder

## 2023-09-30 ENCOUNTER — Ambulatory Visit

## 2023-09-30 DIAGNOSIS — J45991 Cough variant asthma: Secondary | ICD-10-CM | POA: Diagnosis not present

## 2023-09-30 DIAGNOSIS — I639 Cerebral infarction, unspecified: Secondary | ICD-10-CM

## 2023-09-30 DIAGNOSIS — E7800 Pure hypercholesterolemia, unspecified: Secondary | ICD-10-CM | POA: Diagnosis not present

## 2023-09-30 DIAGNOSIS — N183 Chronic kidney disease, stage 3 unspecified: Secondary | ICD-10-CM | POA: Diagnosis not present

## 2023-09-30 DIAGNOSIS — E1169 Type 2 diabetes mellitus with other specified complication: Secondary | ICD-10-CM | POA: Diagnosis not present

## 2023-09-30 LAB — CUP PACEART REMOTE DEVICE CHECK
Date Time Interrogation Session: 20250730231839
Implantable Pulse Generator Implant Date: 20220419

## 2023-10-04 ENCOUNTER — Ambulatory Visit: Payer: Self-pay | Admitting: Cardiology

## 2023-10-06 DIAGNOSIS — I10 Essential (primary) hypertension: Secondary | ICD-10-CM | POA: Diagnosis not present

## 2023-10-06 DIAGNOSIS — N183 Chronic kidney disease, stage 3 unspecified: Secondary | ICD-10-CM | POA: Diagnosis not present

## 2023-10-06 DIAGNOSIS — E1169 Type 2 diabetes mellitus with other specified complication: Secondary | ICD-10-CM | POA: Diagnosis not present

## 2023-10-07 ENCOUNTER — Encounter (INDEPENDENT_AMBULATORY_CARE_PROVIDER_SITE_OTHER): Admitting: Ophthalmology

## 2023-10-07 DIAGNOSIS — H35033 Hypertensive retinopathy, bilateral: Secondary | ICD-10-CM

## 2023-10-07 DIAGNOSIS — Z794 Long term (current) use of insulin: Secondary | ICD-10-CM | POA: Diagnosis not present

## 2023-10-07 DIAGNOSIS — H18221 Idiopathic corneal edema, right eye: Secondary | ICD-10-CM | POA: Diagnosis not present

## 2023-10-07 DIAGNOSIS — H43813 Vitreous degeneration, bilateral: Secondary | ICD-10-CM | POA: Diagnosis not present

## 2023-10-07 DIAGNOSIS — I10 Essential (primary) hypertension: Secondary | ICD-10-CM | POA: Diagnosis not present

## 2023-10-07 DIAGNOSIS — E103313 Type 1 diabetes mellitus with moderate nonproliferative diabetic retinopathy with macular edema, bilateral: Secondary | ICD-10-CM | POA: Diagnosis not present

## 2023-10-08 ENCOUNTER — Ambulatory Visit: Payer: Medicare Other

## 2023-10-25 DIAGNOSIS — G894 Chronic pain syndrome: Secondary | ICD-10-CM | POA: Diagnosis not present

## 2023-10-25 DIAGNOSIS — M48061 Spinal stenosis, lumbar region without neurogenic claudication: Secondary | ICD-10-CM | POA: Diagnosis not present

## 2023-10-25 DIAGNOSIS — M47816 Spondylosis without myelopathy or radiculopathy, lumbar region: Secondary | ICD-10-CM | POA: Diagnosis not present

## 2023-10-25 DIAGNOSIS — Z79891 Long term (current) use of opiate analgesic: Secondary | ICD-10-CM | POA: Diagnosis not present

## 2023-10-25 DIAGNOSIS — M5416 Radiculopathy, lumbar region: Secondary | ICD-10-CM | POA: Diagnosis not present

## 2023-10-31 DIAGNOSIS — E1169 Type 2 diabetes mellitus with other specified complication: Secondary | ICD-10-CM | POA: Diagnosis not present

## 2023-10-31 DIAGNOSIS — E7800 Pure hypercholesterolemia, unspecified: Secondary | ICD-10-CM | POA: Diagnosis not present

## 2023-10-31 DIAGNOSIS — I10 Essential (primary) hypertension: Secondary | ICD-10-CM | POA: Diagnosis not present

## 2023-10-31 DIAGNOSIS — N183 Chronic kidney disease, stage 3 unspecified: Secondary | ICD-10-CM | POA: Diagnosis not present

## 2023-10-31 DIAGNOSIS — J45991 Cough variant asthma: Secondary | ICD-10-CM | POA: Diagnosis not present

## 2023-11-01 ENCOUNTER — Ambulatory Visit

## 2023-11-01 DIAGNOSIS — I639 Cerebral infarction, unspecified: Secondary | ICD-10-CM | POA: Diagnosis not present

## 2023-11-03 LAB — CUP PACEART REMOTE DEVICE CHECK
Date Time Interrogation Session: 20250830230718
Implantable Pulse Generator Implant Date: 20220419

## 2023-11-04 ENCOUNTER — Encounter (INDEPENDENT_AMBULATORY_CARE_PROVIDER_SITE_OTHER): Admitting: Ophthalmology

## 2023-11-04 DIAGNOSIS — H35033 Hypertensive retinopathy, bilateral: Secondary | ICD-10-CM | POA: Diagnosis not present

## 2023-11-04 DIAGNOSIS — H43813 Vitreous degeneration, bilateral: Secondary | ICD-10-CM | POA: Diagnosis not present

## 2023-11-04 DIAGNOSIS — I10 Essential (primary) hypertension: Secondary | ICD-10-CM

## 2023-11-04 DIAGNOSIS — Z794 Long term (current) use of insulin: Secondary | ICD-10-CM

## 2023-11-04 DIAGNOSIS — E113313 Type 2 diabetes mellitus with moderate nonproliferative diabetic retinopathy with macular edema, bilateral: Secondary | ICD-10-CM

## 2023-11-05 DIAGNOSIS — E1169 Type 2 diabetes mellitus with other specified complication: Secondary | ICD-10-CM | POA: Diagnosis not present

## 2023-11-05 DIAGNOSIS — N183 Chronic kidney disease, stage 3 unspecified: Secondary | ICD-10-CM | POA: Diagnosis not present

## 2023-11-05 DIAGNOSIS — I10 Essential (primary) hypertension: Secondary | ICD-10-CM | POA: Diagnosis not present

## 2023-11-06 ENCOUNTER — Ambulatory Visit: Payer: Self-pay | Admitting: Cardiology

## 2023-11-08 NOTE — Progress Notes (Signed)
 Remote Loop Recorder Transmission

## 2023-11-12 ENCOUNTER — Ambulatory Visit: Payer: Medicare Other

## 2023-11-29 NOTE — Progress Notes (Signed)
 Remote Loop Recorder Transmission

## 2023-11-30 DIAGNOSIS — E7800 Pure hypercholesterolemia, unspecified: Secondary | ICD-10-CM | POA: Diagnosis not present

## 2023-11-30 DIAGNOSIS — J45991 Cough variant asthma: Secondary | ICD-10-CM | POA: Diagnosis not present

## 2023-11-30 DIAGNOSIS — E1169 Type 2 diabetes mellitus with other specified complication: Secondary | ICD-10-CM | POA: Diagnosis not present

## 2023-11-30 DIAGNOSIS — N183 Chronic kidney disease, stage 3 unspecified: Secondary | ICD-10-CM | POA: Diagnosis not present

## 2023-12-02 ENCOUNTER — Encounter (INDEPENDENT_AMBULATORY_CARE_PROVIDER_SITE_OTHER): Admitting: Ophthalmology

## 2023-12-02 ENCOUNTER — Ambulatory Visit: Payer: Self-pay | Admitting: Cardiology

## 2023-12-02 ENCOUNTER — Ambulatory Visit

## 2023-12-02 DIAGNOSIS — I639 Cerebral infarction, unspecified: Secondary | ICD-10-CM | POA: Diagnosis not present

## 2023-12-02 LAB — CUP PACEART REMOTE DEVICE CHECK
Date Time Interrogation Session: 20251001230611
Implantable Pulse Generator Implant Date: 20220419

## 2023-12-02 NOTE — Progress Notes (Signed)
 Remote Loop Recorder Transmission

## 2023-12-05 DIAGNOSIS — E1169 Type 2 diabetes mellitus with other specified complication: Secondary | ICD-10-CM | POA: Diagnosis not present

## 2023-12-05 DIAGNOSIS — I10 Essential (primary) hypertension: Secondary | ICD-10-CM | POA: Diagnosis not present

## 2023-12-05 DIAGNOSIS — N183 Chronic kidney disease, stage 3 unspecified: Secondary | ICD-10-CM | POA: Diagnosis not present

## 2023-12-10 ENCOUNTER — Encounter (INDEPENDENT_AMBULATORY_CARE_PROVIDER_SITE_OTHER): Admitting: Ophthalmology

## 2023-12-10 DIAGNOSIS — E113313 Type 2 diabetes mellitus with moderate nonproliferative diabetic retinopathy with macular edema, bilateral: Secondary | ICD-10-CM

## 2023-12-10 DIAGNOSIS — Z794 Long term (current) use of insulin: Secondary | ICD-10-CM

## 2023-12-10 DIAGNOSIS — I10 Essential (primary) hypertension: Secondary | ICD-10-CM | POA: Diagnosis not present

## 2023-12-10 DIAGNOSIS — H35033 Hypertensive retinopathy, bilateral: Secondary | ICD-10-CM

## 2023-12-10 DIAGNOSIS — H43813 Vitreous degeneration, bilateral: Secondary | ICD-10-CM | POA: Diagnosis not present

## 2023-12-17 ENCOUNTER — Ambulatory Visit: Payer: Medicare Other

## 2023-12-27 DIAGNOSIS — M5416 Radiculopathy, lumbar region: Secondary | ICD-10-CM | POA: Diagnosis not present

## 2023-12-27 DIAGNOSIS — G894 Chronic pain syndrome: Secondary | ICD-10-CM | POA: Diagnosis not present

## 2023-12-27 DIAGNOSIS — M48061 Spinal stenosis, lumbar region without neurogenic claudication: Secondary | ICD-10-CM | POA: Diagnosis not present

## 2023-12-27 DIAGNOSIS — M47816 Spondylosis without myelopathy or radiculopathy, lumbar region: Secondary | ICD-10-CM | POA: Diagnosis not present

## 2023-12-31 DIAGNOSIS — I10 Essential (primary) hypertension: Secondary | ICD-10-CM | POA: Diagnosis not present

## 2023-12-31 DIAGNOSIS — E7800 Pure hypercholesterolemia, unspecified: Secondary | ICD-10-CM | POA: Diagnosis not present

## 2023-12-31 DIAGNOSIS — N183 Chronic kidney disease, stage 3 unspecified: Secondary | ICD-10-CM | POA: Diagnosis not present

## 2023-12-31 DIAGNOSIS — J45991 Cough variant asthma: Secondary | ICD-10-CM | POA: Diagnosis not present

## 2023-12-31 DIAGNOSIS — E1169 Type 2 diabetes mellitus with other specified complication: Secondary | ICD-10-CM | POA: Diagnosis not present

## 2024-01-03 ENCOUNTER — Ambulatory Visit

## 2024-01-03 DIAGNOSIS — I639 Cerebral infarction, unspecified: Secondary | ICD-10-CM

## 2024-01-03 LAB — CUP PACEART REMOTE DEVICE CHECK
Date Time Interrogation Session: 20251102231132
Implantable Pulse Generator Implant Date: 20220419

## 2024-01-04 ENCOUNTER — Ambulatory Visit: Payer: Self-pay | Admitting: Cardiology

## 2024-01-04 DIAGNOSIS — N183 Chronic kidney disease, stage 3 unspecified: Secondary | ICD-10-CM | POA: Diagnosis not present

## 2024-01-04 DIAGNOSIS — E1169 Type 2 diabetes mellitus with other specified complication: Secondary | ICD-10-CM | POA: Diagnosis not present

## 2024-01-04 DIAGNOSIS — I10 Essential (primary) hypertension: Secondary | ICD-10-CM | POA: Diagnosis not present

## 2024-01-05 NOTE — Progress Notes (Signed)
 Remote Loop Recorder Transmission

## 2024-01-06 ENCOUNTER — Encounter (INDEPENDENT_AMBULATORY_CARE_PROVIDER_SITE_OTHER): Admitting: Ophthalmology

## 2024-01-06 DIAGNOSIS — Z794 Long term (current) use of insulin: Secondary | ICD-10-CM | POA: Diagnosis not present

## 2024-01-06 DIAGNOSIS — I10 Essential (primary) hypertension: Secondary | ICD-10-CM | POA: Diagnosis not present

## 2024-01-06 DIAGNOSIS — H43813 Vitreous degeneration, bilateral: Secondary | ICD-10-CM

## 2024-01-06 DIAGNOSIS — E113312 Type 2 diabetes mellitus with moderate nonproliferative diabetic retinopathy with macular edema, left eye: Secondary | ICD-10-CM | POA: Diagnosis not present

## 2024-01-06 DIAGNOSIS — E113391 Type 2 diabetes mellitus with moderate nonproliferative diabetic retinopathy without macular edema, right eye: Secondary | ICD-10-CM

## 2024-01-06 DIAGNOSIS — H35033 Hypertensive retinopathy, bilateral: Secondary | ICD-10-CM | POA: Diagnosis not present

## 2024-01-20 DIAGNOSIS — E1169 Type 2 diabetes mellitus with other specified complication: Secondary | ICD-10-CM | POA: Diagnosis not present

## 2024-01-20 DIAGNOSIS — Z794 Long term (current) use of insulin: Secondary | ICD-10-CM | POA: Diagnosis not present

## 2024-01-20 DIAGNOSIS — I10 Essential (primary) hypertension: Secondary | ICD-10-CM | POA: Diagnosis not present

## 2024-01-20 DIAGNOSIS — J45991 Cough variant asthma: Secondary | ICD-10-CM | POA: Diagnosis not present

## 2024-01-20 DIAGNOSIS — Z6832 Body mass index (BMI) 32.0-32.9, adult: Secondary | ICD-10-CM | POA: Diagnosis not present

## 2024-01-20 DIAGNOSIS — F419 Anxiety disorder, unspecified: Secondary | ICD-10-CM | POA: Diagnosis not present

## 2024-01-21 ENCOUNTER — Ambulatory Visit: Payer: Medicare Other

## 2024-02-03 ENCOUNTER — Ambulatory Visit

## 2024-02-03 ENCOUNTER — Ambulatory Visit: Payer: Self-pay | Admitting: Cardiovascular Disease

## 2024-02-03 ENCOUNTER — Encounter (INDEPENDENT_AMBULATORY_CARE_PROVIDER_SITE_OTHER): Admitting: Ophthalmology

## 2024-02-03 DIAGNOSIS — Z794 Long term (current) use of insulin: Secondary | ICD-10-CM

## 2024-02-03 DIAGNOSIS — H43813 Vitreous degeneration, bilateral: Secondary | ICD-10-CM | POA: Diagnosis not present

## 2024-02-03 DIAGNOSIS — H35371 Puckering of macula, right eye: Secondary | ICD-10-CM

## 2024-02-03 DIAGNOSIS — H35033 Hypertensive retinopathy, bilateral: Secondary | ICD-10-CM

## 2024-02-03 DIAGNOSIS — E113312 Type 2 diabetes mellitus with moderate nonproliferative diabetic retinopathy with macular edema, left eye: Secondary | ICD-10-CM | POA: Diagnosis not present

## 2024-02-03 DIAGNOSIS — E113391 Type 2 diabetes mellitus with moderate nonproliferative diabetic retinopathy without macular edema, right eye: Secondary | ICD-10-CM

## 2024-02-03 DIAGNOSIS — I10 Essential (primary) hypertension: Secondary | ICD-10-CM

## 2024-02-03 DIAGNOSIS — I639 Cerebral infarction, unspecified: Secondary | ICD-10-CM | POA: Diagnosis not present

## 2024-02-03 LAB — CUP PACEART REMOTE DEVICE CHECK
Date Time Interrogation Session: 20251203231313
Implantable Pulse Generator Implant Date: 20220419

## 2024-02-09 NOTE — Progress Notes (Signed)
 Remote Loop Recorder Transmission

## 2024-02-25 ENCOUNTER — Ambulatory Visit: Payer: Medicare Other

## 2024-03-05 ENCOUNTER — Ambulatory Visit: Attending: Cardiovascular Disease

## 2024-03-05 DIAGNOSIS — I639 Cerebral infarction, unspecified: Secondary | ICD-10-CM | POA: Diagnosis not present

## 2024-03-06 LAB — CUP PACEART REMOTE DEVICE CHECK
Date Time Interrogation Session: 20260103230629
Implantable Pulse Generator Implant Date: 20220419

## 2024-03-09 ENCOUNTER — Encounter (INDEPENDENT_AMBULATORY_CARE_PROVIDER_SITE_OTHER): Admitting: Ophthalmology

## 2024-03-09 DIAGNOSIS — H35371 Puckering of macula, right eye: Secondary | ICD-10-CM | POA: Diagnosis not present

## 2024-03-09 DIAGNOSIS — E113391 Type 2 diabetes mellitus with moderate nonproliferative diabetic retinopathy without macular edema, right eye: Secondary | ICD-10-CM | POA: Diagnosis not present

## 2024-03-09 DIAGNOSIS — Z794 Long term (current) use of insulin: Secondary | ICD-10-CM | POA: Diagnosis not present

## 2024-03-09 DIAGNOSIS — H35033 Hypertensive retinopathy, bilateral: Secondary | ICD-10-CM

## 2024-03-09 DIAGNOSIS — H43813 Vitreous degeneration, bilateral: Secondary | ICD-10-CM | POA: Diagnosis not present

## 2024-03-09 DIAGNOSIS — I10 Essential (primary) hypertension: Secondary | ICD-10-CM

## 2024-03-09 DIAGNOSIS — E113312 Type 2 diabetes mellitus with moderate nonproliferative diabetic retinopathy with macular edema, left eye: Secondary | ICD-10-CM

## 2024-03-09 NOTE — Progress Notes (Signed)
 Remote Loop Recorder Transmission

## 2024-03-11 ENCOUNTER — Ambulatory Visit: Payer: Self-pay | Admitting: Cardiovascular Disease

## 2024-04-05 ENCOUNTER — Ambulatory Visit

## 2024-04-05 LAB — CUP PACEART REMOTE DEVICE CHECK
Date Time Interrogation Session: 20260203230812
Implantable Pulse Generator Implant Date: 20220419

## 2024-04-06 ENCOUNTER — Encounter (INDEPENDENT_AMBULATORY_CARE_PROVIDER_SITE_OTHER): Admitting: Ophthalmology

## 2024-04-11 ENCOUNTER — Encounter (INDEPENDENT_AMBULATORY_CARE_PROVIDER_SITE_OTHER): Admitting: Ophthalmology

## 2024-05-06 ENCOUNTER — Ambulatory Visit

## 2024-06-06 ENCOUNTER — Ambulatory Visit

## 2024-07-07 ENCOUNTER — Ambulatory Visit
